# Patient Record
Sex: Female | Born: 1935 | Race: White | Hispanic: No | Marital: Married | Smoking: Never smoker
Health system: Southern US, Community
[De-identification: ages and names within clinical notes are randomized; demographics above are authoritative.]

## PROBLEM LIST (undated history)

## (undated) DIAGNOSIS — I509 Heart failure, unspecified: Secondary | ICD-10-CM

## (undated) DIAGNOSIS — G459 Transient cerebral ischemic attack, unspecified: Secondary | ICD-10-CM

## (undated) DIAGNOSIS — E785 Hyperlipidemia, unspecified: Secondary | ICD-10-CM

## (undated) DIAGNOSIS — M810 Age-related osteoporosis without current pathological fracture: Secondary | ICD-10-CM

## (undated) DIAGNOSIS — E039 Hypothyroidism, unspecified: Secondary | ICD-10-CM

## (undated) DIAGNOSIS — M47812 Spondylosis without myelopathy or radiculopathy, cervical region: Secondary | ICD-10-CM

## (undated) DIAGNOSIS — E890 Postprocedural hypothyroidism: Secondary | ICD-10-CM

## (undated) DIAGNOSIS — I10 Essential (primary) hypertension: Secondary | ICD-10-CM

## (undated) DIAGNOSIS — T4145XA Adverse effect of unspecified anesthetic, initial encounter: Secondary | ICD-10-CM

## (undated) DIAGNOSIS — K219 Gastro-esophageal reflux disease without esophagitis: Secondary | ICD-10-CM

## (undated) DIAGNOSIS — H409 Unspecified glaucoma: Secondary | ICD-10-CM

## (undated) DIAGNOSIS — J8410 Pulmonary fibrosis, unspecified: Secondary | ICD-10-CM

## (undated) DIAGNOSIS — C221 Intrahepatic bile duct carcinoma: Secondary | ICD-10-CM

## (undated) DIAGNOSIS — R000 Tachycardia, unspecified: Secondary | ICD-10-CM

## (undated) DIAGNOSIS — R112 Nausea with vomiting, unspecified: Secondary | ICD-10-CM

## (undated) DIAGNOSIS — C801 Malignant (primary) neoplasm, unspecified: Secondary | ICD-10-CM

## (undated) DIAGNOSIS — T8859XA Other complications of anesthesia, initial encounter: Secondary | ICD-10-CM

## (undated) DIAGNOSIS — Z9889 Other specified postprocedural states: Secondary | ICD-10-CM

## (undated) HISTORY — PX: THYROIDECTOMY: SHX17

## (undated) HISTORY — PX: ABDOMINAL HYSTERECTOMY: SHX81

## (undated) HISTORY — DX: Hyperlipidemia, unspecified: E78.5

## (undated) HISTORY — PX: JOINT REPLACEMENT: SHX530

## (undated) HISTORY — DX: Postprocedural hypothyroidism: E89.0

## (undated) HISTORY — DX: Essential (primary) hypertension: I10

## (undated) HISTORY — DX: Unspecified glaucoma: H40.9

## (undated) HISTORY — DX: Transient cerebral ischemic attack, unspecified: G45.9

## (undated) HISTORY — DX: Heart failure, unspecified: I50.9

## (undated) HISTORY — DX: Age-related osteoporosis without current pathological fracture: M81.0

## (undated) HISTORY — PX: EYE SURGERY: SHX253

## (undated) HISTORY — DX: Tachycardia, unspecified: R00.0

## (undated) HISTORY — PX: CHOLECYSTECTOMY: SHX55

## (undated) HISTORY — DX: Hypothyroidism, unspecified: E03.9

## (undated) HISTORY — DX: Spondylosis without myelopathy or radiculopathy, cervical region: M47.812

---

## 2013-04-23 DIAGNOSIS — H4011X0 Primary open-angle glaucoma, stage unspecified: Secondary | ICD-10-CM | POA: Diagnosis not present

## 2013-05-23 DIAGNOSIS — Z23 Encounter for immunization: Secondary | ICD-10-CM | POA: Diagnosis not present

## 2013-05-23 DIAGNOSIS — I10 Essential (primary) hypertension: Secondary | ICD-10-CM | POA: Diagnosis not present

## 2013-05-23 DIAGNOSIS — R7309 Other abnormal glucose: Secondary | ICD-10-CM | POA: Diagnosis not present

## 2013-05-23 DIAGNOSIS — K219 Gastro-esophageal reflux disease without esophagitis: Secondary | ICD-10-CM | POA: Diagnosis not present

## 2013-05-23 DIAGNOSIS — R030 Elevated blood-pressure reading, without diagnosis of hypertension: Secondary | ICD-10-CM | POA: Diagnosis not present

## 2013-05-30 DIAGNOSIS — I6789 Other cerebrovascular disease: Secondary | ICD-10-CM | POA: Diagnosis not present

## 2013-05-30 DIAGNOSIS — M542 Cervicalgia: Secondary | ICD-10-CM | POA: Diagnosis not present

## 2013-05-30 DIAGNOSIS — M47812 Spondylosis without myelopathy or radiculopathy, cervical region: Secondary | ICD-10-CM | POA: Diagnosis not present

## 2013-05-30 DIAGNOSIS — R918 Other nonspecific abnormal finding of lung field: Secondary | ICD-10-CM | POA: Diagnosis not present

## 2013-05-30 DIAGNOSIS — R42 Dizziness and giddiness: Secondary | ICD-10-CM | POA: Diagnosis not present

## 2013-05-30 DIAGNOSIS — M5030 Other cervical disc degeneration, unspecified cervical region: Secondary | ICD-10-CM | POA: Diagnosis not present

## 2013-05-31 DIAGNOSIS — R5381 Other malaise: Secondary | ICD-10-CM | POA: Diagnosis not present

## 2013-05-31 DIAGNOSIS — E041 Nontoxic single thyroid nodule: Secondary | ICD-10-CM | POA: Diagnosis not present

## 2013-05-31 DIAGNOSIS — I10 Essential (primary) hypertension: Secondary | ICD-10-CM | POA: Diagnosis not present

## 2013-05-31 DIAGNOSIS — R918 Other nonspecific abnormal finding of lung field: Secondary | ICD-10-CM | POA: Diagnosis not present

## 2013-05-31 DIAGNOSIS — M47812 Spondylosis without myelopathy or radiculopathy, cervical region: Secondary | ICD-10-CM | POA: Diagnosis not present

## 2013-05-31 DIAGNOSIS — I6789 Other cerebrovascular disease: Secondary | ICD-10-CM | POA: Diagnosis not present

## 2013-05-31 DIAGNOSIS — M5020 Other cervical disc displacement, unspecified cervical region: Secondary | ICD-10-CM | POA: Diagnosis not present

## 2013-05-31 DIAGNOSIS — M5030 Other cervical disc degeneration, unspecified cervical region: Secondary | ICD-10-CM | POA: Diagnosis not present

## 2013-05-31 DIAGNOSIS — M542 Cervicalgia: Secondary | ICD-10-CM | POA: Diagnosis not present

## 2013-05-31 DIAGNOSIS — E042 Nontoxic multinodular goiter: Secondary | ICD-10-CM | POA: Diagnosis not present

## 2013-05-31 DIAGNOSIS — E785 Hyperlipidemia, unspecified: Secondary | ICD-10-CM | POA: Diagnosis not present

## 2013-05-31 DIAGNOSIS — R42 Dizziness and giddiness: Secondary | ICD-10-CM | POA: Diagnosis not present

## 2013-06-07 DIAGNOSIS — E785 Hyperlipidemia, unspecified: Secondary | ICD-10-CM | POA: Diagnosis not present

## 2013-06-07 DIAGNOSIS — M479 Spondylosis, unspecified: Secondary | ICD-10-CM | POA: Diagnosis not present

## 2013-06-07 DIAGNOSIS — G459 Transient cerebral ischemic attack, unspecified: Secondary | ICD-10-CM | POA: Diagnosis not present

## 2013-06-07 DIAGNOSIS — I10 Essential (primary) hypertension: Secondary | ICD-10-CM | POA: Diagnosis not present

## 2013-07-17 DIAGNOSIS — E041 Nontoxic single thyroid nodule: Secondary | ICD-10-CM | POA: Diagnosis not present

## 2013-07-23 DIAGNOSIS — H4011X0 Primary open-angle glaucoma, stage unspecified: Secondary | ICD-10-CM | POA: Diagnosis not present

## 2013-07-24 DIAGNOSIS — R7989 Other specified abnormal findings of blood chemistry: Secondary | ICD-10-CM | POA: Diagnosis not present

## 2013-07-24 DIAGNOSIS — E785 Hyperlipidemia, unspecified: Secondary | ICD-10-CM | POA: Diagnosis not present

## 2013-07-24 DIAGNOSIS — I10 Essential (primary) hypertension: Secondary | ICD-10-CM | POA: Diagnosis not present

## 2013-07-26 DIAGNOSIS — E041 Nontoxic single thyroid nodule: Secondary | ICD-10-CM | POA: Diagnosis not present

## 2013-08-08 DIAGNOSIS — M79609 Pain in unspecified limb: Secondary | ICD-10-CM | POA: Diagnosis not present

## 2013-08-08 DIAGNOSIS — S99919A Unspecified injury of unspecified ankle, initial encounter: Secondary | ICD-10-CM | POA: Diagnosis not present

## 2013-08-08 DIAGNOSIS — S8990XA Unspecified injury of unspecified lower leg, initial encounter: Secondary | ICD-10-CM | POA: Diagnosis not present

## 2013-08-08 DIAGNOSIS — S92309A Fracture of unspecified metatarsal bone(s), unspecified foot, initial encounter for closed fracture: Secondary | ICD-10-CM | POA: Diagnosis not present

## 2013-08-10 DIAGNOSIS — S92309A Fracture of unspecified metatarsal bone(s), unspecified foot, initial encounter for closed fracture: Secondary | ICD-10-CM | POA: Diagnosis not present

## 2013-08-16 DIAGNOSIS — S92309A Fracture of unspecified metatarsal bone(s), unspecified foot, initial encounter for closed fracture: Secondary | ICD-10-CM | POA: Diagnosis not present

## 2013-08-28 DIAGNOSIS — E041 Nontoxic single thyroid nodule: Secondary | ICD-10-CM | POA: Diagnosis not present

## 2013-08-28 DIAGNOSIS — Z01812 Encounter for preprocedural laboratory examination: Secondary | ICD-10-CM | POA: Diagnosis not present

## 2013-08-28 DIAGNOSIS — Z01810 Encounter for preprocedural cardiovascular examination: Secondary | ICD-10-CM | POA: Diagnosis not present

## 2013-08-28 DIAGNOSIS — Z01811 Encounter for preprocedural respiratory examination: Secondary | ICD-10-CM | POA: Diagnosis not present

## 2013-08-28 DIAGNOSIS — I10 Essential (primary) hypertension: Secondary | ICD-10-CM | POA: Diagnosis not present

## 2013-09-19 DIAGNOSIS — E041 Nontoxic single thyroid nodule: Secondary | ICD-10-CM | POA: Diagnosis not present

## 2013-09-19 DIAGNOSIS — I10 Essential (primary) hypertension: Secondary | ICD-10-CM | POA: Diagnosis not present

## 2013-09-19 DIAGNOSIS — K219 Gastro-esophageal reflux disease without esophagitis: Secondary | ICD-10-CM | POA: Diagnosis not present

## 2013-09-19 DIAGNOSIS — D34 Benign neoplasm of thyroid gland: Secondary | ICD-10-CM | POA: Diagnosis not present

## 2013-09-19 DIAGNOSIS — E049 Nontoxic goiter, unspecified: Secondary | ICD-10-CM | POA: Diagnosis not present

## 2013-09-20 DIAGNOSIS — K219 Gastro-esophageal reflux disease without esophagitis: Secondary | ICD-10-CM | POA: Diagnosis not present

## 2013-09-20 DIAGNOSIS — D34 Benign neoplasm of thyroid gland: Secondary | ICD-10-CM | POA: Diagnosis not present

## 2013-09-20 DIAGNOSIS — I10 Essential (primary) hypertension: Secondary | ICD-10-CM | POA: Diagnosis not present

## 2013-09-24 DIAGNOSIS — M810 Age-related osteoporosis without current pathological fracture: Secondary | ICD-10-CM | POA: Diagnosis not present

## 2013-10-11 DIAGNOSIS — E890 Postprocedural hypothyroidism: Secondary | ICD-10-CM | POA: Diagnosis not present

## 2013-10-11 DIAGNOSIS — Z9089 Acquired absence of other organs: Secondary | ICD-10-CM | POA: Diagnosis not present

## 2013-10-18 DIAGNOSIS — D509 Iron deficiency anemia, unspecified: Secondary | ICD-10-CM | POA: Diagnosis not present

## 2013-10-22 DIAGNOSIS — Z961 Presence of intraocular lens: Secondary | ICD-10-CM | POA: Diagnosis not present

## 2013-10-22 DIAGNOSIS — H4011X0 Primary open-angle glaucoma, stage unspecified: Secondary | ICD-10-CM | POA: Diagnosis not present

## 2013-11-26 DIAGNOSIS — Z23 Encounter for immunization: Secondary | ICD-10-CM | POA: Diagnosis not present

## 2013-11-26 DIAGNOSIS — I10 Essential (primary) hypertension: Secondary | ICD-10-CM | POA: Diagnosis not present

## 2013-11-26 DIAGNOSIS — E785 Hyperlipidemia, unspecified: Secondary | ICD-10-CM | POA: Diagnosis not present

## 2013-11-26 DIAGNOSIS — I6350 Cerebral infarction due to unspecified occlusion or stenosis of unspecified cerebral artery: Secondary | ICD-10-CM | POA: Diagnosis not present

## 2013-12-03 DIAGNOSIS — K625 Hemorrhage of anus and rectum: Secondary | ICD-10-CM | POA: Diagnosis not present

## 2013-12-19 DIAGNOSIS — K644 Residual hemorrhoidal skin tags: Secondary | ICD-10-CM | POA: Diagnosis not present

## 2013-12-19 DIAGNOSIS — D125 Benign neoplasm of sigmoid colon: Secondary | ICD-10-CM | POA: Diagnosis not present

## 2013-12-19 DIAGNOSIS — K625 Hemorrhage of anus and rectum: Secondary | ICD-10-CM | POA: Diagnosis not present

## 2013-12-19 DIAGNOSIS — K5730 Diverticulosis of large intestine without perforation or abscess without bleeding: Secondary | ICD-10-CM | POA: Diagnosis not present

## 2014-01-22 DIAGNOSIS — H4011X1 Primary open-angle glaucoma, mild stage: Secondary | ICD-10-CM | POA: Diagnosis not present

## 2014-01-23 DIAGNOSIS — E890 Postprocedural hypothyroidism: Secondary | ICD-10-CM | POA: Diagnosis not present

## 2014-01-23 DIAGNOSIS — E048 Other specified nontoxic goiter: Secondary | ICD-10-CM | POA: Diagnosis not present

## 2014-01-24 DIAGNOSIS — Z1283 Encounter for screening for malignant neoplasm of skin: Secondary | ICD-10-CM | POA: Diagnosis not present

## 2014-01-24 DIAGNOSIS — L570 Actinic keratosis: Secondary | ICD-10-CM | POA: Diagnosis not present

## 2014-01-24 DIAGNOSIS — D485 Neoplasm of uncertain behavior of skin: Secondary | ICD-10-CM | POA: Diagnosis not present

## 2014-01-24 DIAGNOSIS — D049 Carcinoma in situ of skin, unspecified: Secondary | ICD-10-CM | POA: Diagnosis not present

## 2014-01-24 DIAGNOSIS — L820 Inflamed seborrheic keratosis: Secondary | ICD-10-CM | POA: Diagnosis not present

## 2014-02-05 DIAGNOSIS — Z9889 Other specified postprocedural states: Secondary | ICD-10-CM | POA: Diagnosis not present

## 2014-02-05 DIAGNOSIS — R490 Dysphonia: Secondary | ICD-10-CM | POA: Diagnosis not present

## 2014-02-20 DIAGNOSIS — D0439 Carcinoma in situ of skin of other parts of face: Secondary | ICD-10-CM | POA: Diagnosis not present

## 2014-03-21 DIAGNOSIS — L821 Other seborrheic keratosis: Secondary | ICD-10-CM | POA: Diagnosis not present

## 2014-03-21 DIAGNOSIS — L579 Skin changes due to chronic exposure to nonionizing radiation, unspecified: Secondary | ICD-10-CM | POA: Diagnosis not present

## 2014-03-21 DIAGNOSIS — Z85828 Personal history of other malignant neoplasm of skin: Secondary | ICD-10-CM | POA: Diagnosis not present

## 2014-03-21 DIAGNOSIS — L570 Actinic keratosis: Secondary | ICD-10-CM | POA: Diagnosis not present

## 2014-04-01 DIAGNOSIS — M810 Age-related osteoporosis without current pathological fracture: Secondary | ICD-10-CM | POA: Diagnosis not present

## 2014-04-02 DIAGNOSIS — Z9089 Acquired absence of other organs: Secondary | ICD-10-CM | POA: Diagnosis not present

## 2014-04-02 DIAGNOSIS — E890 Postprocedural hypothyroidism: Secondary | ICD-10-CM | POA: Diagnosis not present

## 2014-04-23 DIAGNOSIS — L0100 Impetigo, unspecified: Secondary | ICD-10-CM | POA: Diagnosis not present

## 2014-04-29 DIAGNOSIS — L0100 Impetigo, unspecified: Secondary | ICD-10-CM | POA: Diagnosis not present

## 2014-05-07 DIAGNOSIS — G459 Transient cerebral ischemic attack, unspecified: Secondary | ICD-10-CM | POA: Diagnosis not present

## 2014-05-07 DIAGNOSIS — E785 Hyperlipidemia, unspecified: Secondary | ICD-10-CM | POA: Diagnosis not present

## 2014-05-07 DIAGNOSIS — H409 Unspecified glaucoma: Secondary | ICD-10-CM | POA: Diagnosis not present

## 2014-05-07 DIAGNOSIS — M810 Age-related osteoporosis without current pathological fracture: Secondary | ICD-10-CM | POA: Diagnosis not present

## 2014-05-07 DIAGNOSIS — E890 Postprocedural hypothyroidism: Secondary | ICD-10-CM | POA: Diagnosis not present

## 2014-05-07 DIAGNOSIS — I10 Essential (primary) hypertension: Secondary | ICD-10-CM | POA: Diagnosis not present

## 2014-05-17 DIAGNOSIS — H4011X2 Primary open-angle glaucoma, moderate stage: Secondary | ICD-10-CM | POA: Diagnosis not present

## 2014-05-17 DIAGNOSIS — Z961 Presence of intraocular lens: Secondary | ICD-10-CM | POA: Diagnosis not present

## 2014-05-28 DIAGNOSIS — E890 Postprocedural hypothyroidism: Secondary | ICD-10-CM | POA: Diagnosis not present

## 2014-08-06 DIAGNOSIS — M7701 Medial epicondylitis, right elbow: Secondary | ICD-10-CM | POA: Diagnosis not present

## 2014-08-06 DIAGNOSIS — M810 Age-related osteoporosis without current pathological fracture: Secondary | ICD-10-CM | POA: Diagnosis not present

## 2014-08-06 DIAGNOSIS — M79643 Pain in unspecified hand: Secondary | ICD-10-CM | POA: Diagnosis not present

## 2014-08-06 DIAGNOSIS — M5032 Other cervical disc degeneration, mid-cervical region: Secondary | ICD-10-CM | POA: Diagnosis not present

## 2014-08-06 DIAGNOSIS — M19041 Primary osteoarthritis, right hand: Secondary | ICD-10-CM | POA: Diagnosis not present

## 2014-08-06 DIAGNOSIS — M150 Primary generalized (osteo)arthritis: Secondary | ICD-10-CM | POA: Diagnosis not present

## 2014-08-06 DIAGNOSIS — M542 Cervicalgia: Secondary | ICD-10-CM | POA: Diagnosis not present

## 2014-08-06 DIAGNOSIS — M19042 Primary osteoarthritis, left hand: Secondary | ICD-10-CM | POA: Diagnosis not present

## 2014-08-16 DIAGNOSIS — H4011X2 Primary open-angle glaucoma, moderate stage: Secondary | ICD-10-CM | POA: Diagnosis not present

## 2014-09-02 DIAGNOSIS — M810 Age-related osteoporosis without current pathological fracture: Secondary | ICD-10-CM | POA: Diagnosis not present

## 2014-09-04 DIAGNOSIS — Z0000 Encounter for general adult medical examination without abnormal findings: Secondary | ICD-10-CM | POA: Diagnosis not present

## 2014-09-04 DIAGNOSIS — E785 Hyperlipidemia, unspecified: Secondary | ICD-10-CM | POA: Diagnosis not present

## 2014-09-04 DIAGNOSIS — E890 Postprocedural hypothyroidism: Secondary | ICD-10-CM | POA: Diagnosis not present

## 2014-11-26 DIAGNOSIS — H4011X2 Primary open-angle glaucoma, moderate stage: Secondary | ICD-10-CM | POA: Diagnosis not present

## 2014-11-26 DIAGNOSIS — H04211 Epiphora due to excess lacrimation, right lacrimal gland: Secondary | ICD-10-CM | POA: Diagnosis not present

## 2014-12-23 DIAGNOSIS — Z23 Encounter for immunization: Secondary | ICD-10-CM | POA: Diagnosis not present

## 2015-02-03 DIAGNOSIS — M1990 Unspecified osteoarthritis, unspecified site: Secondary | ICD-10-CM | POA: Diagnosis not present

## 2015-02-03 DIAGNOSIS — M810 Age-related osteoporosis without current pathological fracture: Secondary | ICD-10-CM | POA: Diagnosis not present

## 2015-02-03 DIAGNOSIS — M47812 Spondylosis without myelopathy or radiculopathy, cervical region: Secondary | ICD-10-CM | POA: Diagnosis not present

## 2015-02-03 DIAGNOSIS — M542 Cervicalgia: Secondary | ICD-10-CM | POA: Diagnosis not present

## 2015-02-25 DIAGNOSIS — H401123 Primary open-angle glaucoma, left eye, severe stage: Secondary | ICD-10-CM | POA: Diagnosis not present

## 2015-02-25 DIAGNOSIS — H401113 Primary open-angle glaucoma, right eye, severe stage: Secondary | ICD-10-CM | POA: Diagnosis not present

## 2015-03-04 DIAGNOSIS — M810 Age-related osteoporosis without current pathological fracture: Secondary | ICD-10-CM | POA: Diagnosis not present

## 2015-09-24 DIAGNOSIS — M6740 Ganglion, unspecified site: Secondary | ICD-10-CM | POA: Insufficient documentation

## 2015-09-24 DIAGNOSIS — M19041 Primary osteoarthritis, right hand: Secondary | ICD-10-CM | POA: Insufficient documentation

## 2015-09-25 ENCOUNTER — Other Ambulatory Visit: Payer: Self-pay | Admitting: Orthopedic Surgery

## 2015-10-14 ENCOUNTER — Encounter (HOSPITAL_BASED_OUTPATIENT_CLINIC_OR_DEPARTMENT_OTHER): Payer: Self-pay

## 2015-10-14 ENCOUNTER — Ambulatory Visit (HOSPITAL_BASED_OUTPATIENT_CLINIC_OR_DEPARTMENT_OTHER): Admit: 2015-10-14 | Payer: Self-pay | Admitting: Orthopedic Surgery

## 2015-10-14 SURGERY — EXCISION MASS
Anesthesia: Monitor Anesthesia Care | Laterality: Right

## 2015-10-17 ENCOUNTER — Other Ambulatory Visit: Payer: Self-pay | Admitting: Family Medicine

## 2015-10-17 ENCOUNTER — Ambulatory Visit
Admission: RE | Admit: 2015-10-17 | Discharge: 2015-10-17 | Disposition: A | Discharge status: Home / Self Care | Payer: Medicare Other | Source: Ambulatory Visit | Attending: Family Medicine | Admitting: Family Medicine

## 2015-10-17 DIAGNOSIS — R0602 Shortness of breath: Secondary | ICD-10-CM

## 2015-10-21 ENCOUNTER — Other Ambulatory Visit (HOSPITAL_COMMUNITY): Payer: Self-pay | Admitting: Family Medicine

## 2015-10-21 DIAGNOSIS — R0600 Dyspnea, unspecified: Secondary | ICD-10-CM

## 2015-10-24 ENCOUNTER — Ambulatory Visit (HOSPITAL_COMMUNITY)
Admission: RE | Admit: 2015-10-24 | Discharge: 2015-10-24 | Disposition: A | Discharge status: Home / Self Care | Payer: Medicare Other | Source: Ambulatory Visit | Attending: Nurse Practitioner | Admitting: Nurse Practitioner

## 2015-10-24 DIAGNOSIS — R0600 Dyspnea, unspecified: Secondary | ICD-10-CM

## 2015-10-24 DIAGNOSIS — I351 Nonrheumatic aortic (valve) insufficiency: Secondary | ICD-10-CM | POA: Diagnosis not present

## 2015-10-24 DIAGNOSIS — I059 Rheumatic mitral valve disease, unspecified: Secondary | ICD-10-CM | POA: Insufficient documentation

## 2015-10-24 LAB — ECHOCARDIOGRAM COMPLETE
AVPHT: 547 ms
EERAT: 10.29
EWDT: 243 ms
FS: 25 % — AB (ref 28–44)
IVS/LV PW RATIO, ED: 0.88
LA vol A4C: 67.7 ml
LASIZE: 39 mm
LAVOL: 68.9 mL
LDCA: 3.8 cm2
LEFT ATRIUM END SYS DIAM: 39 mm
LV E/e' medial: 10.29
LV E/e'average: 10.29
LV PW d: 10.7 mm — AB (ref 0.6–1.1)
LV TDI E'MEDIAL: 5.77
LV e' LATERAL: 9.57 cm/s
LVOT diameter: 22 mm
MV Dec: 243
MV pk A vel: 74.7 m/s
MV pk E vel: 98.5 m/s
MVPG: 4 mmHg
RV TAPSE: 21.7 mm
Reg peak vel: 280 cm/s
TDI e' lateral: 9.57
TRMAXVEL: 280 cm/s

## 2015-10-24 NOTE — Progress Notes (Signed)
  Echocardiogram 2D Echocardiogram has been performed.  Summer Nielsen 10/24/2015, 3:48 PM

## 2015-10-29 DIAGNOSIS — M810 Age-related osteoporosis without current pathological fracture: Secondary | ICD-10-CM

## 2015-10-29 DIAGNOSIS — E785 Hyperlipidemia, unspecified: Secondary | ICD-10-CM

## 2015-10-29 DIAGNOSIS — I158 Other secondary hypertension: Secondary | ICD-10-CM

## 2015-10-29 DIAGNOSIS — H409 Unspecified glaucoma: Secondary | ICD-10-CM

## 2015-10-29 DIAGNOSIS — M47812 Spondylosis without myelopathy or radiculopathy, cervical region: Secondary | ICD-10-CM

## 2015-10-29 DIAGNOSIS — I509 Heart failure, unspecified: Secondary | ICD-10-CM

## 2015-10-29 DIAGNOSIS — E890 Postprocedural hypothyroidism: Secondary | ICD-10-CM

## 2015-10-29 DIAGNOSIS — G458 Other transient cerebral ischemic attacks and related syndromes: Secondary | ICD-10-CM

## 2015-10-30 ENCOUNTER — Encounter: Payer: Self-pay | Admitting: Cardiology

## 2015-10-30 ENCOUNTER — Ambulatory Visit (INDEPENDENT_AMBULATORY_CARE_PROVIDER_SITE_OTHER): Payer: Medicare Other | Admitting: Cardiology

## 2015-10-30 VITALS — BP 126/62 | HR 62 | Ht 61.0 in | Wt 143.0 lb

## 2015-10-30 DIAGNOSIS — I509 Heart failure, unspecified: Secondary | ICD-10-CM | POA: Insufficient documentation

## 2015-10-30 DIAGNOSIS — I5031 Acute diastolic (congestive) heart failure: Secondary | ICD-10-CM | POA: Insufficient documentation

## 2015-10-30 DIAGNOSIS — G459 Transient cerebral ischemic attack, unspecified: Secondary | ICD-10-CM | POA: Insufficient documentation

## 2015-10-30 DIAGNOSIS — E890 Postprocedural hypothyroidism: Secondary | ICD-10-CM | POA: Insufficient documentation

## 2015-10-30 DIAGNOSIS — IMO0002 Reserved for concepts with insufficient information to code with codable children: Secondary | ICD-10-CM | POA: Insufficient documentation

## 2015-10-30 DIAGNOSIS — M47812 Spondylosis without myelopathy or radiculopathy, cervical region: Secondary | ICD-10-CM | POA: Insufficient documentation

## 2015-10-30 DIAGNOSIS — I272 Other secondary pulmonary hypertension: Secondary | ICD-10-CM

## 2015-10-30 DIAGNOSIS — I110 Hypertensive heart disease with heart failure: Secondary | ICD-10-CM | POA: Diagnosis not present

## 2015-10-30 DIAGNOSIS — E785 Hyperlipidemia, unspecified: Secondary | ICD-10-CM | POA: Insufficient documentation

## 2015-10-30 DIAGNOSIS — H409 Unspecified glaucoma: Secondary | ICD-10-CM | POA: Insufficient documentation

## 2015-10-30 DIAGNOSIS — M810 Age-related osteoporosis without current pathological fracture: Secondary | ICD-10-CM | POA: Insufficient documentation

## 2015-10-30 DIAGNOSIS — I10 Essential (primary) hypertension: Secondary | ICD-10-CM | POA: Insufficient documentation

## 2015-10-30 NOTE — Patient Instructions (Signed)
Medication Instructions:  The current medical regimen is effective;  continue present plan and medications.  Follow-Up: Follow up in 2 months with Dr Marlou Porch.  Thank you for choosing [facility]!!

## 2015-10-30 NOTE — Progress Notes (Signed)
Cardiology Office Note    Date:  10/30/2015   ID:  Summer Nielsen, DOB 04-10-1927, MRN [id number]  PCP:  Jonathon Bellows, MD  Cardiologist:   Candee Furbish, MD   No chief complaint on file.   History of Present Illness:  Summer Nielsen is a 82 y.o. female here for evaluation of acute diastolic heart failure with elevated BNP, grade 2 diastolic dysfunction on echocardiogram, pulmonary infiltrate.  A few weeks ago, she began having more shortness of breath and was diagnosed originally with pneumonia. No fever, no chills. Treated with antibiotic course. She never improved that much. Dr. Justin Mend checked an echocardiogram which showed grade 2 diastolic dysfunction and BNP which was 200. She placed her on Lasix 40 mg once a day. Her lower extremity edema has improved. Her breathing is better but not completely resolved.  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 They are now here with their grand children.   Past Medical History:  Diagnosis Date  . CHF (congestive heart failure) ([place])   . Glaucoma   . High blood pressure   . Hyperlipidemia   . Hypothyroidism   . OP (osteoporosis)   . Postsurgical hypothyroidism   . 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 Tachycardia   . TIA (transient ischemic attack)     No past surgical history on file.  Current Medications: Outpatient Medications Prior to Visit  Medication Sig Dispense Refill  . AMLODIPINE BESYLATE PO Take 5 mg by mouth daily.    . ATORVASTATIN CALCIUM PO Take 20 mg by mouth daily.    . bimatoprost (LUMIGAN) 0.01 % SOLN 1 drop into affected eye in the evening    . calcium-vitamin D (OSCAL WITH D) 500-200 MG-UNIT tablet Take 1 tablet by mouth 2 (two) times daily.    . furosemide (LASIX) 40 MG tablet Take 40 mg by mouth  daily.    Marland Kitchen levothyroxine (SYNTHROID, LEVOTHROID) 112 MCG tablet Take 112 mcg by mouth daily before breakfast.    . LOSARTAN POTASSIUM PO Take 50 mg by mouth daily.    . meloxicam (MOBIC) 15 MG tablet Take 15 mg by mouth daily.    . Multiple Vitamins-Minerals (CENTRUM SILVER PO) Take by mouth daily.    Marland Kitchen omeprazole (PRILOSEC) 20 MG capsule Take 20 mg by mouth daily.    Marland Kitchen PROPRANOLOL HCL PO Take 20 mg by mouth 2 (two) times daily.    Marland Kitchen azithromycin (ZITHROMAX) 250 MG tablet 250 mg. Take 2 tablets on the first day then 1 tablet daily for 4 days, then once daily     No facility-administered medications prior to visit.      Allergies:   Levofloxacin and Percocet [oxycodone-acetaminophen]   Social History   Social History  . Marital status: Married    Spouse name: N/A  . Number of children: N/A  . Years of education: N/A   Social History Main Topics  . Smoking status: Never Smoker  . Smokeless tobacco: Never Used  . Alcohol use Not on file  . Drug use: Unknown  . Sexual activity: Not on file   Other Topics Concern  . Not on file   Social History Narrative  . No narrative on file     Family History:  The patient's father  died of MI. 47's.    ROS:   Please see the history of present illness.    ROS All other systems reviewed and are negative.   PHYSICAL EXAM:   VS:  BP 126/62   Pulse 62   Ht _0  (1.549 m)   Wt 143 lb (64.9 kg)   BMI 27.02 kg/m    GEN: Well nourished, well developed, in no acute distress  HEENT: normal  Neck: no JVD, carotid bruits, or masses Cardiac: RRR; no murmurs, rubs, or gallops,no edema  Respiratory:  clear to auscultation bilaterally, normal work of breathing GI: soft, nontender, nondistended, + BS MS: no deformity or atrophy  Skin: warm and dry, no rash Neuro:  Alert and Oriented x 3, Strength and sensation are intact Psych: euthymic mood, full affect  Wt Readings from Last 3 Encounters:  10/30/15 143 lb (64.9 kg)      Studies/Labs  Reviewed:   EKG:  None today  Recent Labs: No results found for requested labs within last 8760 hours.   Lipid Panel No results found for: CHOL, TRIG, HDL, CHOLHDL, VLDL, LDLCALC, LDLDIRECT  Additional studies/ records that were reviewed today include:   ECHO 10/24/15: - Left ventricle: The cavity size was normal. Systolic function was   normal. The estimated ejection fraction was in the range of 60%   to 65%. Wall motion was normal; there were no regional wall   motion abnormalities. Features are consistent with a pseudonormal   left ventricular filling pattern, with concomitant abnormal   relaxation and increased filling pressure (grade 2 diastolic   dysfunction). - Aortic valve: There was mild regurgitation. - Mitral valve: Calcified annulus. - Pulmonary arteries: Systolic pressure was mildly increased. PA   peak pressure: 34 mm Hg (S).  Prior lab work, office notes, EKG reviewed. Hemoglobin 11. Creatinine 0.8. BNP 200.   ASSESSMENT:    1. Acute diastolic heart failure ([place])   2. Secondary pulmonary hypertension ([place])   3. Hypertensive heart disease with heart failure (HCC)      PLAN:  In order of problems listed above:  Acute diastolic heart failure  - Continue with Lasix 40 mg once a day.  - Dr. Justin Mend will be checking her basic metabolic profile soon.  - I agree with current treatment plan. She seems to be improving. Definitely has lost fluid weight within her lower extremities and for overall body weight is decreased a few pounds.  - Continue with daily weights, salt restriction, fluid restriction 1.5 L a day.  - Overall I'm very pleased with her current treatment plan.   - I would like to see her back in 2 months.  Secondary pulmonary hypertension  - Very mild, likely a result of her diastolic dysfunction.   Essential hypertension  - Improved with Lasix menstruation. Watch renal function. On amlodipine, losartan, propranolol  Bradycardia  - Improved since  decreasing beta blocker dose.      Medication Adjustments/Labs and Tests Ordered: Current medicines are reviewed at length with the patient today.  Concerns regarding medicines are outlined above.  Medication changes, Labs and Tests ordered today are listed in the Patient Instructions below. Patient Instructions  Medication Instructions:  The current medical regimen is effective;  continue present plan and medications.  Follow-Up: Follow up in 2 months with Dr Marlou Porch.  Thank you for choosing [facility]!!        Signed, Candee Furbish, MD  10/30/2015 3:57 PM    [facility]  [place], [place], [place]  [address] Phone: [phone]; Fax: [phone]

## 2015-11-07 ENCOUNTER — Encounter: Payer: Self-pay | Admitting: Cardiology

## 2015-11-24 ENCOUNTER — Telehealth: Payer: Self-pay | Admitting: Cardiology

## 2015-11-24 NOTE — Telephone Encounter (Signed)
Lm to cb to discuss s/s.

## 2015-11-24 NOTE — Telephone Encounter (Signed)
New message     Pt c/o of Chest Pain: STAT if CP now or developed within 24 hours  1. Are you having CP right now? no  2. Are you experiencing any other symptoms (ex. SOB, nausea, vomiting, sweating)? no  3. How long have you been experiencing CP? weeks  4. Is your CP continuous or coming and going? coming and going 5. Have you taken Nitroglycerin? no?

## 2015-11-24 NOTE — Telephone Encounter (Signed)
We'll see how she does over the next few days. If her chest discomfort/jaw discomfort returns we will consider Lexiscan nuclear stress test. Pain certainly does have atypical qualities.  Candee Furbish, MD

## 2015-11-24 NOTE — Telephone Encounter (Signed)
Pt is reporting for the last several weeks, atleast 3 times a day but sometimes more frequently, she has had chest pain that starts above her breast and radiates up into her chin, no other radiation.  She reports it feels like pins and needles and is very painful.  She stops what she is doing and sits down.  The pain resolves on its own after about a minute.  She denies any other new s/s.  She has had SOB for some time that is essentially unchanged.  There is no correlation to eating/drinking.  Last HR 61 and BP has been between 112-124/60's.  She is not currently having any chest discomfort.  She takes medications as listed. Advised I will forward information to Dr Marlou Porch and call her back with any changes, recommendations and/or orders.

## 2015-11-24 NOTE — Telephone Encounter (Signed)
Spoke with pt RE: Dr Marlou Porch recommendation.  She will call back on Wednesday or Thursday to let me know how she is feeling. Further orders/recommendations will be given at that time.

## 2015-11-27 ENCOUNTER — Telehealth: Payer: Self-pay | Admitting: Cardiology

## 2015-11-27 NOTE — Telephone Encounter (Signed)
New Message  Pt calling requesting to speak with RN. Pt states her chest and jaw discomfort has returned and was instructed to call if it returned. Please call back to discuss

## 2015-11-27 NOTE — Telephone Encounter (Signed)
Called patient.  She wanted to report update on the chest pain she called about on 9/18 (see phone note).  This same pain occurred twice yesterday, in the am-after breakfast, mild, brief (couple seconds), and around 6 pm, after dinner, this was more severe and lasted a couple minutes then resolved.    The pain is sharp and needle like, from top of breast up to her chin.  She has had none today. No swelling. SOB is still present, no better, no worse, since starting lasix.  Pt is aware that I am forwarding to Dr. Marlou Porch to review and for any further advisement.

## 2015-11-28 NOTE — Telephone Encounter (Signed)
Left message to return call RE: recent concerns.

## 2015-11-28 NOTE — Telephone Encounter (Signed)
Pain sounds atypical and non cardiac in nature.  Reassurance Edema improved - excellent  If she would like, set her up for NUC stress test Carlton Adam).   Candee Furbish, MD

## 2015-11-28 NOTE — Telephone Encounter (Signed)
Follow up   Pt is returning call for rn

## 2015-11-28 NOTE — Telephone Encounter (Signed)
Spoke with pt who is reporting s/s are sporadic and she will c/b if they occur again.  She does not want to have further testing at this time.  She will keep her appt as scheduled but will c/b prior to then if necessary.

## 2015-12-25 ENCOUNTER — Encounter: Payer: Self-pay | Admitting: Cardiology

## 2016-01-08 ENCOUNTER — Encounter: Payer: Self-pay | Admitting: Cardiology

## 2016-01-08 ENCOUNTER — Ambulatory Visit (INDEPENDENT_AMBULATORY_CARE_PROVIDER_SITE_OTHER): Payer: Medicare Other | Admitting: Cardiology

## 2016-01-08 ENCOUNTER — Encounter (INDEPENDENT_AMBULATORY_CARE_PROVIDER_SITE_OTHER): Payer: Self-pay

## 2016-01-08 VITALS — BP 136/74 | HR 58 | Ht 62.0 in | Wt 144.6 lb

## 2016-01-08 DIAGNOSIS — I110 Hypertensive heart disease with heart failure: Secondary | ICD-10-CM | POA: Diagnosis not present

## 2016-01-08 DIAGNOSIS — R0600 Dyspnea, unspecified: Secondary | ICD-10-CM | POA: Diagnosis not present

## 2016-01-08 DIAGNOSIS — I5032 Chronic diastolic (congestive) heart failure: Secondary | ICD-10-CM | POA: Diagnosis not present

## 2016-01-08 NOTE — Patient Instructions (Addendum)

## 2016-01-08 NOTE — Progress Notes (Signed)
Cardiology Office Note    Date:  01/08/2016   ID:  Ethie Curless, DOB 05-16-1927, MRN [id number]  PCP:  Jonathon Bellows, MD  Cardiologist:   Candee Furbish, MD     History of Present Illness:  Summer Nielsen is a 82 y.o. female here for Follow-up of acute diastolic heart failure with elevated BNP, grade 2 diastolic dysfunction on echocardiogram, pulmonary infiltrate.  In July 2017, she began having more shortness of breath and was diagnosed originally with pneumonia. No fever, no chills. Treated with antibiotic course. She never improved that much. Dr. Justin Mend checked an echocardiogram which showed grade 2 diastolic dysfunction and BNP which was 200. She placed her on Lasix 40 mg once a day. Her lower extremity edema has improved. Her breathing is better but not completely resolved. Definitely improved.  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 They are now here with their grand children.  No squeezing CP up neck as she had previously transiently.    Past Medical History:  Diagnosis Date  . CHF (congestive heart failure) ([place])   . Glaucoma   . High blood pressure   . Hyperlipidemia   . Hypothyroidism   . OP (osteoporosis)   . Postsurgical hypothyroidism   . 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 Tachycardia   . TIA (transient ischemic attack)     No past surgical history on file.  Current Medications: Outpatient Medications Prior to Visit  Medication Sig Dispense Refill  . AMLODIPINE BESYLATE PO Take 5 mg by mouth daily.    . ATORVASTATIN CALCIUM PO Take 20 mg by mouth daily.    . bimatoprost (LUMIGAN) 0.01 % SOLN 1 drop into affected eye in the evening    . calcium-vitamin D (OSCAL WITH D) 500-200 MG-UNIT tablet Take 1 tablet by mouth 2 (two) times daily.    .  furosemide (LASIX) 40 MG tablet Take 40 mg by mouth daily.    Marland Kitchen levothyroxine (SYNTHROID, LEVOTHROID) 112 MCG tablet Take 112 mcg by mouth daily before breakfast.    . LOSARTAN POTASSIUM PO Take 50 mg by mouth daily.    . meloxicam (MOBIC) 15 MG tablet Take 15 mg by mouth daily.    . Multiple Vitamins-Minerals (CENTRUM SILVER PO) Take by mouth daily.    Marland Kitchen omeprazole (PRILOSEC) 20 MG capsule Take 20 mg by mouth daily.    Marland Kitchen PROPRANOLOL HCL PO Take 20 mg by mouth 2 (two) times daily.     No facility-administered medications prior to visit.      Allergies:   Levofloxacin and Percocet [oxycodone-acetaminophen]   Social History   Social History  . Marital status: Married    Spouse name: N/A  . Number of children: N/A  . Years of education: N/A   Social History Main Topics  . Smoking status: Never Smoker  . Smokeless tobacco: Never Used  . Alcohol use No  . Drug use: No  . Sexual activity: Not Asked   Other Topics Concern  . None   Social History Narrative  . None     Family History:  The patient's father died of MI. 55's.    ROS:   Please see the history of present illness.    ROS All other systems reviewed and are negative.  PHYSICAL EXAM:   VS:  BP 136/74   Pulse (!) 58   Ht _0  (1.575 m)   Wt 144 lb 9.6 oz (65.6 kg)   LMP  (LMP Unknown)   BMI 26.45 kg/m    GEN: Well nourished, well developed, in no acute distress  HEENT: normal  Neck: no JVD, carotid bruits, or masses Cardiac: RRR; no murmurs, rubs, or gallops,no edema  Respiratory:  clear to auscultation bilaterally, normal work of breathing GI: soft, nontender, nondistended, + BS MS: no deformity or atrophy  Skin: warm and dry, no rash Neuro:  Alert and Oriented x 3, Strength and sensation are intact Psych: euthymic mood, full affect  Wt Readings from Last 3 Encounters:  01/08/16 144 lb 9.6 oz (65.6 kg)  10/30/15 143 lb (64.9 kg)      Studies/Labs Reviewed:   EKG:  EKG was ordered today  01/08/16-sinus bradycardia rate 58 with no other abnormalities. Personally viewed.  Recent Labs: No results found for requested labs within last 8760 hours.   Lipid Panel No results found for: CHOL, TRIG, HDL, CHOLHDL, VLDL, LDLCALC, LDLDIRECT  Additional studies/ records that were reviewed today include:   ECHO 10/24/15: - Left ventricle: The cavity size was normal. Systolic function was   normal. The estimated ejection fraction was in the range of 60%   to 65%. Wall motion was normal; there were no regional wall   motion abnormalities. Features are consistent with a pseudonormal   left ventricular filling pattern, with concomitant abnormal   relaxation and increased filling pressure (grade 2 diastolic   dysfunction). - Aortic valve: There was mild regurgitation. - Mitral valve: Calcified annulus. - Pulmonary arteries: Systolic pressure was mildly increased. PA   peak pressure: 34 mm Hg (S).  Prior lab work, office notes, EKG reviewed. Hemoglobin 11. Creatinine 0.8. BNP 200.   ASSESSMENT:    1. Chronic diastolic congestive heart failure ([place])   2. Hypertensive heart disease with heart failure ([place])   3. Dyspnea, unspecified type      PLAN:  In order of problems listed above:  Chronic diastolic heart failure  - Continue with Lasix 40 mg once a day.  - Dr. Justin Mend will be checking her basic metabolic profile soon.  - I agree with current treatment plan. She seems to be improving. Definitely has lost fluid weight within her lower extremities and for overall body weight is decreased a few pounds.  - Continue with daily weights, salt restriction, fluid restriction 1.5 L a day.  - Overall I'm very pleased with her current treatment plan.   - I would like to see her back in 6 months.  Secondary pulmonary hypertension  - Very mild, likely a result of her diastolic dysfunction.   Essential hypertension  - Improved with Lasix. Watch renal function. On amlodipine, losartan,  propranolol  Bradycardia  - Improved since decreasing beta blocker dose. No syncope, doing well      Medication Adjustments/Labs and Tests Ordered: Current medicines are reviewed at length with the patient today.  Concerns regarding medicines are outlined above.  Medication changes, Labs and Tests ordered today are listed in the Patient Instructions below. Patient Instructions  Medication Instructions:  The current medical regimen is effective;  continue present plan and medications.  Follow-Up: Follow up in 6 months with Dr. Marlou Porch.  You will receive a letter in the mail 2 months before you are due.  Please call us when you receive this letter to schedule your follow  up appointment.  If you need a refill on your cardiac medications before your next appointment, please call your pharmacy.  Thank you for choosing [facility]!!        Signed, Candee Furbish, MD  01/08/2016 3:38 PM    [address], [place], [place]  [address] Phone: [phone]; Fax: [phone]

## 2016-02-16 ENCOUNTER — Ambulatory Visit (INDEPENDENT_AMBULATORY_CARE_PROVIDER_SITE_OTHER): Payer: Medicare Other | Admitting: Orthopaedic Surgery

## 2016-02-16 ENCOUNTER — Encounter (INDEPENDENT_AMBULATORY_CARE_PROVIDER_SITE_OTHER): Payer: Self-pay

## 2016-02-16 ENCOUNTER — Encounter (INDEPENDENT_AMBULATORY_CARE_PROVIDER_SITE_OTHER): Payer: Self-pay | Admitting: Orthopaedic Surgery

## 2016-02-16 VITALS — Ht 62.0 in | Wt 143.0 lb

## 2016-02-16 DIAGNOSIS — G8929 Other chronic pain: Secondary | ICD-10-CM | POA: Insufficient documentation

## 2016-02-16 DIAGNOSIS — M25511 Pain in right shoulder: Secondary | ICD-10-CM | POA: Diagnosis not present

## 2016-02-16 NOTE — Progress Notes (Signed)
Office Visit Note   Patient: Summer Nielsen           Date of Birth: 01-16-1928           MRN: [id number] Visit Date: 02/16/2016              Requested by: Maurice Small, MD [place], [place] [address] PCP: Jonathon Bellows, MD   Assessment & Plan: Visit Diagnoses:  1. Chronic right shoulder pain     Plan: Impression is rotator cuff syndrome of the right shoulder. Subacromial injection was given today hopefully this will calm it down and give her good relief. I like to reevaluate her in 3 weeks. If she is not better we'll need right shoulder x-rays and possibly MRI.  Follow-Up Instructions: Return in about 3 weeks (around 03/08/2016) for recheck right shoulder.   Orders:  No orders of the defined types were placed in this encounter.  No orders of the defined types were placed in this encounter.     Procedures: Large Joint Inj Date/Time: 02/16/2016 11:14 AM Performed by: Leandrew Koyanagi Authorized by: Leandrew Koyanagi   Consent Given by:  Patient Timeout: prior to procedure the correct patient, procedure, and site was verified   Indications:  Pain Location:  Shoulder Site:  R subacromial bursa Prep: patient was prepped and draped in usual sterile fashion   Needle Size:  22 G Approach:  Posterior Ultrasound Guidance: No   Fluoroscopic Guidance: No       Clinical Data: No additional findings.   Subjective: Chief Complaint  Patient presents with  . Right Shoulder - Pain    Summer Nielsen is a very pleasant 82 year old female with right shoulder and upper arm pain for about a month. She did have a fall about 4 months ago. Pain is 7 out of 10 minutes worse at night and worse with use of the arm and better with rest. Denies any radicular symptoms. Denies any constitutional symptoms.    Review of Systems  Constitutional: Negative.   HENT: Negative.   Eyes: Negative.   Respiratory: Negative.   Cardiovascular: Negative.   Endocrine:  Negative.   Musculoskeletal: Negative.   Neurological: Negative.   Hematological: Negative.   Psychiatric/Behavioral: Negative.   All other systems reviewed and are negative.    Objective: Vital Signs: Ht _0  (1.575 m)   Wt 143 lb (64.9 kg)   LMP  (LMP Unknown)   BMI 26.16 kg/m   Physical Exam  Constitutional: She is oriented to person, place, and time. She appears well-developed and well-nourished.  HENT:  Head: Atraumatic.  Eyes: EOM are normal.  Neck: Neck supple.  Cardiovascular: Intact distal pulses.   Pulmonary/Chest: Effort normal.  Abdominal: Soft.  Neurological: She is alert and oriented to person, place, and time.  Skin: Skin is warm. Capillary refill takes less than 2 seconds.  Psychiatric: She has a normal mood and affect. Her behavior is normal. Judgment and thought content normal.  Nursing note and vitals reviewed.   Ortho Exam Exam of the right shoulder shows normal passive and active range of motion. She does have positive Hawkins impingement. Positive cross adduction positive empty can infraspinatus testing is with pain and secondary weakness. Specialty Comments:  No specialty comments available.  Imaging: No results found.   PMFS History: Patient Active Problem List   Diagnosis Date Noted  . Chronic right shoulder pain 02/16/2016  . Acute diastolic heart failure ([place]) 10/30/2015  .  Secondary pulmonary hypertension 10/30/2015  . Hypertensive heart disease with heart failure ([place]) 10/30/2015  . TIA (transient ischemic attack)   . 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 Postsurgical hypothyroidism   . OP (osteoporosis)   . Hyperlipidemia   . High blood pressure   . Glaucoma   . CHF (congestive heart failure) (HCC)    Past Medical History:  Diagnosis Date  . CHF (congestive heart failure) ([place])   . Glaucoma   . High blood pressure   . Hyperlipidemia   . Hypothyroidism   . OP (osteoporosis)   . Postsurgical  hypothyroidism   . 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 Tachycardia   . TIA (transient ischemic attack)     No family history on file.  No past surgical history on file. Social History   Occupational History  . Not on file.   Social History Main Topics  . Smoking status: Never Smoker  . Smokeless tobacco: Never Used  . Alcohol use No  . Drug use: No  . Sexual activity: Not on file

## 2016-03-11 ENCOUNTER — Encounter (INDEPENDENT_AMBULATORY_CARE_PROVIDER_SITE_OTHER): Payer: Self-pay | Admitting: Orthopaedic Surgery

## 2016-03-11 ENCOUNTER — Ambulatory Visit (INDEPENDENT_AMBULATORY_CARE_PROVIDER_SITE_OTHER): Payer: Medicare Other | Admitting: Orthopaedic Surgery

## 2016-03-11 DIAGNOSIS — M25511 Pain in right shoulder: Secondary | ICD-10-CM

## 2016-03-11 DIAGNOSIS — G8929 Other chronic pain: Secondary | ICD-10-CM | POA: Diagnosis not present

## 2016-03-11 NOTE — Progress Notes (Signed)
   Office Visit Note   Patient: Summer Nielsen           Date of Birth: Jun 29, 1927           MRN: [id number] Visit Date: 03/11/2016              Requested by: Maurice Small, MD [place] [place], [place] [address] PCP: Jonathon Bellows, MD   Assessment & Plan: Visit Diagnoses:  1. Chronic right shoulder pain     Plan: MRI right shoulder to rule out structural abnormality and rotator cuff tear. follow-up after the MRI  Follow-Up Instructions: Return for recheck MRI right shoulder.   Orders:  Orders Placed This Encounter  Procedures  . MR SHOULDER RIGHT WO CONTRAST   No orders of the defined types were placed in this encounter.     Procedures: No procedures performed   Clinical Data: No additional findings.   Subjective: Chief Complaint  Patient presents with  . Right Shoulder - Pain    Agent comes back today for recheck of her right shoulder status post subacromial injection. She states she is about 50% better but still not where she wants to be. She is taking Advil for pain.    Review of Systems   Objective: Vital Signs: LMP  (LMP Unknown)   Physical Exam  Ortho Exam Exam of her right shoulder shows grossly intact rotator cuff testing but with pain with resistance. Specialty Comments:  No specialty comments available.  Imaging: No results found.   PMFS History: Patient Active Problem List   Diagnosis Date Noted  . Chronic right shoulder pain 02/16/2016  . Acute diastolic heart failure ([place]) 10/30/2015  . Secondary pulmonary hypertension 10/30/2015  . Hypertensive heart disease with heart failure ([place]) 10/30/2015  . TIA (transient ischemic attack)   . 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 Postsurgical hypothyroidism   . OP (osteoporosis)   . Hyperlipidemia   . High blood pressure   . Glaucoma   . CHF (congestive heart failure) (HCC)    Past Medical History:  Diagnosis Date  . CHF  (congestive heart failure) ([place])   . Glaucoma   . High blood pressure   . Hyperlipidemia   . Hypothyroidism   . OP (osteoporosis)   . Postsurgical hypothyroidism   . 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 Tachycardia   . TIA (transient ischemic attack)     No family history on file.  No past surgical history on file. Social History   Occupational History  . Not on file.   Social History Main Topics  . Smoking status: Never Smoker  . Smokeless tobacco: Never Used  . Alcohol use No  . Drug use: No  . Sexual activity: Not on file

## 2016-03-19 ENCOUNTER — Ambulatory Visit
Admission: RE | Admit: 2016-03-19 | Discharge: 2016-03-19 | Disposition: A | Discharge status: Home / Self Care | Payer: Medicare Other | Source: Ambulatory Visit | Attending: Orthopaedic Surgery | Admitting: Orthopaedic Surgery

## 2016-03-19 DIAGNOSIS — M25511 Pain in right shoulder: Principal | ICD-10-CM

## 2016-03-19 DIAGNOSIS — G8929 Other chronic pain: Secondary | ICD-10-CM

## 2016-03-26 ENCOUNTER — Ambulatory Visit (INDEPENDENT_AMBULATORY_CARE_PROVIDER_SITE_OTHER): Payer: Medicare Other | Admitting: Orthopaedic Surgery

## 2016-03-26 ENCOUNTER — Encounter (INDEPENDENT_AMBULATORY_CARE_PROVIDER_SITE_OTHER): Payer: Self-pay | Admitting: Orthopaedic Surgery

## 2016-03-26 DIAGNOSIS — M7541 Impingement syndrome of right shoulder: Secondary | ICD-10-CM | POA: Diagnosis not present

## 2016-03-26 MED ORDER — DICLOFENAC SODIUM 1 % TD GEL: 2.0000 g | Freq: Four times a day (QID) | TRANSDERMAL | 5 refills | Status: DC

## 2016-03-26 NOTE — Progress Notes (Signed)
Office Visit Note   Patient: Summer Nielsen           Date of Birth: [birth year]           MRN: [id number] Visit Date: 03/26/2016              Requested by: Maurice Small, MD [address] [place], [place] [address] PCP: Jonathon Bellows, MD   Assessment & Plan: Visit Diagnoses:  1. Impingement syndrome of right shoulder     Plan: Impression is right shoulder impingement and acromioclavicular joint arthropathy. MRI shows rotator cuff tendinosis without any frank tears. At this point patient seems to be doing okay and is managing it in does not feel that it'll require surgery. 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 If it does get worse she is to let us know about surgery which I described to her in detail which would involve an acromioplasty and distal clavicle excision. Questions encouraged and answered we'll see her back as needed prescription for Voltaren gel was given today  Follow-Up Instructions: Return if symptoms worsen or fail to improve.   Orders:  No orders of the defined types were placed in this encounter.  Meds ordered this encounter  Medications  . diclofenac sodium (VOLTAREN) 1 % GEL    Sig: Apply 2 g topically 4 (four) times daily.    Dispense:  1 Tube    Refill:  5      Procedures: No procedures performed   Clinical Data: No additional findings.   Subjective: Chief Complaint  Patient presents with  . Right Shoulder - Pain    Patient comes in today to review her MRI. She is doing okay overall.    Review of Systems   Objective: Vital Signs: LMP  (LMP Unknown)   Physical Exam  Ortho Exam Exam of the right shoulder shows mainly impingement signs and positive cross adduction sign. Specialty Comments:  No specialty comments available.  Imaging: No results found.   PMFS History: Patient Active Problem List   Diagnosis Date Noted  . Chronic right shoulder pain 02/16/2016  . Acute diastolic heart failure ([facility])  10/30/2015  . Secondary pulmonary hypertension 10/30/2015  . Hypertensive heart disease with heart failure ([facility]) 10/30/2015  . TIA (transient ischemic attack)   . 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 Postsurgical hypothyroidism   . OP (osteoporosis)   . Hyperlipidemia   . High blood pressure   . Glaucoma   . CHF (congestive heart failure) ([facility])    Past Medical History:  Diagnosis Date  . CHF (congestive heart failure) ([facility])   . Glaucoma   . High blood pressure   . Hyperlipidemia   . Hypothyroidism   . OP (osteoporosis)   . Postsurgical hypothyroidism   . 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 Tachycardia   . TIA (transient ischemic attack)     No family history on file.  No past surgical history on file. Social History   Occupational History  . Not on file.   Social History Main Topics  . Smoking status: Never Smoker  . Smokeless tobacco: Never Used  . Alcohol use No  . Drug use: No  . Sexual activity: Not on file

## 2016-05-20 ENCOUNTER — Emergency Department (HOSPITAL_COMMUNITY): Payer: Medicare Other

## 2016-05-20 ENCOUNTER — Observation Stay (HOSPITAL_COMMUNITY)
Admission: EM | Admit: 2016-05-20 | Discharge: 2016-05-22 | Disposition: A | Discharge status: Home / Self Care | Payer: Medicare Other | Attending: Internal Medicine | Admitting: Internal Medicine

## 2016-05-20 ENCOUNTER — Observation Stay (HOSPITAL_BASED_OUTPATIENT_CLINIC_OR_DEPARTMENT_OTHER): Payer: Medicare Other

## 2016-05-20 ENCOUNTER — Encounter (HOSPITAL_COMMUNITY): Payer: Self-pay | Admitting: Family Medicine

## 2016-05-20 DIAGNOSIS — D509 Iron deficiency anemia, unspecified: Secondary | ICD-10-CM | POA: Diagnosis present

## 2016-05-20 DIAGNOSIS — R0602 Shortness of breath: Secondary | ICD-10-CM

## 2016-05-20 DIAGNOSIS — Z885 Allergy status to narcotic agent status: Secondary | ICD-10-CM | POA: Diagnosis not present

## 2016-05-20 DIAGNOSIS — E7800 Pure hypercholesterolemia, unspecified: Secondary | ICD-10-CM | POA: Insufficient documentation

## 2016-05-20 DIAGNOSIS — M810 Age-related osteoporosis without current pathological fracture: Secondary | ICD-10-CM | POA: Insufficient documentation

## 2016-05-20 DIAGNOSIS — E890 Postprocedural hypothyroidism: Secondary | ICD-10-CM | POA: Diagnosis not present

## 2016-05-20 DIAGNOSIS — H409 Unspecified glaucoma: Secondary | ICD-10-CM | POA: Diagnosis not present

## 2016-05-20 DIAGNOSIS — K219 Gastro-esophageal reflux disease without esophagitis: Secondary | ICD-10-CM | POA: Insufficient documentation

## 2016-05-20 DIAGNOSIS — R2689 Other abnormalities of gait and mobility: Secondary | ICD-10-CM | POA: Insufficient documentation

## 2016-05-20 DIAGNOSIS — D649 Anemia, unspecified: Principal | ICD-10-CM | POA: Insufficient documentation

## 2016-05-20 DIAGNOSIS — I110 Hypertensive heart disease with heart failure: Secondary | ICD-10-CM | POA: Diagnosis not present

## 2016-05-20 DIAGNOSIS — Z8673 Personal history of transient ischemic attack (TIA), and cerebral infarction without residual deficits: Secondary | ICD-10-CM | POA: Insufficient documentation

## 2016-05-20 DIAGNOSIS — E785 Hyperlipidemia, unspecified: Secondary | ICD-10-CM | POA: Diagnosis not present

## 2016-05-20 DIAGNOSIS — I5032 Chronic diastolic (congestive) heart failure: Secondary | ICD-10-CM | POA: Insufficient documentation

## 2016-05-20 DIAGNOSIS — R0600 Dyspnea, unspecified: Secondary | ICD-10-CM

## 2016-05-20 DIAGNOSIS — M25511 Pain in right shoulder: Secondary | ICD-10-CM | POA: Diagnosis not present

## 2016-05-20 DIAGNOSIS — N179 Acute kidney failure, unspecified: Secondary | ICD-10-CM | POA: Insufficient documentation

## 2016-05-20 DIAGNOSIS — I509 Heart failure, unspecified: Secondary | ICD-10-CM

## 2016-05-20 DIAGNOSIS — G8929 Other chronic pain: Secondary | ICD-10-CM | POA: Insufficient documentation

## 2016-05-20 DIAGNOSIS — Z791 Long term (current) use of non-steroidal anti-inflammatories (NSAID): Secondary | ICD-10-CM | POA: Diagnosis not present

## 2016-05-20 LAB — FOLATE: FOLATE: 34.8 ng/mL (ref >5.9)

## 2016-05-20 LAB — CBC WITH DIFFERENTIAL/PLATELET
Basophils Absolute: 0 10*3/uL (ref 0.0–0.1)
Basophils Relative: 0 %
Eosinophils Absolute: 0.6 10*3/uL (ref 0.0–0.7)
Eosinophils Relative: 8 %
HEMATOCRIT: 23.2 % — AB (ref 36.0–46.0)
HEMOGLOBIN: 6.9 g/dL — AB (ref 12.0–15.0)
LYMPHS ABS: 1.8 10*3/uL (ref 0.7–4.0)
LYMPHS PCT: 25 %
MCH: 24.4 pg — AB (ref 26.0–34.0)
MCHC: 29.7 g/dL — AB (ref 30.0–36.0)
MCV: 82 fL (ref 78.0–100.0)
MONOS PCT: 9 %
Monocytes Absolute: 0.6 10*3/uL (ref 0.1–1.0)
NEUTROS ABS: 4.2 10*3/uL (ref 1.7–7.7)
NEUTROS PCT: 58 %
Platelets: 391 10*3/uL (ref 150–400)
RBC: 2.83 MIL/uL — AB (ref 3.87–5.11)
RDW: 16 % — ABNORMAL HIGH (ref 11.5–15.5)
WBC: 7.2 10*3/uL (ref 4.0–10.5)

## 2016-05-20 LAB — RETICULOCYTES
RBC.: 4.15 MIL/uL (ref 3.87–5.11)
RETIC COUNT ABSOLUTE: 83 10*3/uL (ref 19.0–186.0)
RETIC CT PCT: 2 % (ref 0.4–3.1)

## 2016-05-20 LAB — COMPREHENSIVE METABOLIC PANEL
ALBUMIN: 3.5 g/dL (ref 3.5–5.0)
ALK PHOS: 36 U/L — AB (ref 38–126)
ALT: 13 U/L — ABNORMAL LOW (ref 14–54)
ANION GAP: 8 (ref 5–15)
AST: 20 U/L (ref 15–41)
BUN: 24 mg/dL — ABNORMAL HIGH (ref 6–20)
CALCIUM: 8.3 mg/dL — AB (ref 8.9–10.3)
CO2: 25 mmol/L (ref 22–32)
Chloride: 107 mmol/L (ref 101–111)
Creatinine, Ser: 1.21 mg/dL — ABNORMAL HIGH (ref 0.44–1.00)
GFR calc Af Amer: 45 mL/min — ABNORMAL LOW (ref >60)
GFR calc non Af Amer: 39 mL/min — ABNORMAL LOW (ref >60)
GLUCOSE: 116 mg/dL — AB (ref 65–99)
POTASSIUM: 3.7 mmol/L (ref 3.5–5.1)
SODIUM: 140 mmol/L (ref 135–145)
Total Bilirubin: 0.4 mg/dL (ref 0.3–1.2)
Total Protein: 6.3 g/dL — ABNORMAL LOW (ref 6.5–8.1)

## 2016-05-20 LAB — FERRITIN: Ferritin: 10 ng/mL — ABNORMAL LOW (ref 11–307)

## 2016-05-20 LAB — IRON AND TIBC
Iron: 205 ug/dL — ABNORMAL HIGH (ref 28–170)
SATURATION RATIOS: 44 % — AB (ref 10.4–31.8)
TIBC: 465 ug/dL — ABNORMAL HIGH (ref 250–450)
UIBC: 260 ug/dL

## 2016-05-20 LAB — URINALYSIS, ROUTINE W REFLEX MICROSCOPIC
Bilirubin Urine: NEGATIVE
GLUCOSE, UA: NEGATIVE mg/dL
Hgb urine dipstick: NEGATIVE
KETONES UR: NEGATIVE mg/dL
LEUKOCYTES UA: NEGATIVE
Nitrite: NEGATIVE
PROTEIN: NEGATIVE mg/dL
Specific Gravity, Urine: 1.004 — ABNORMAL LOW (ref 1.005–1.030)
pH: 7 (ref 5.0–8.0)

## 2016-05-20 LAB — HEMOGLOBIN AND HEMATOCRIT, BLOOD
HCT: 33.2 % — ABNORMAL LOW (ref 36.0–46.0)
Hemoglobin: 10.2 g/dL — ABNORMAL LOW (ref 12.0–15.0)

## 2016-05-20 LAB — APTT: aPTT: 29 seconds (ref 24–36)

## 2016-05-20 LAB — BRAIN NATRIURETIC PEPTIDE: B Natriuretic Peptide: 427.1 pg/mL — ABNORMAL HIGH (ref 0.0–100.0)

## 2016-05-20 LAB — FIBRINOGEN: FIBRINOGEN: 434 mg/dL (ref 210–475)

## 2016-05-20 LAB — ECHOCARDIOGRAM COMPLETE
HEIGHTINCHES: 62 in
WEIGHTICAEL: 2304 [oz_av]

## 2016-05-20 LAB — PROTIME-INR
INR: 1.01
PROTHROMBIN TIME: 13.3 s (ref 11.4–15.2)

## 2016-05-20 LAB — VITAMIN B12: VITAMIN B 12: 711 pg/mL (ref 180–914)

## 2016-05-20 LAB — PREPARE RBC (CROSSMATCH)

## 2016-05-20 LAB — I-STAT TROPONIN, ED: TROPONIN I, POC: 0 ng/mL (ref 0.00–0.08)

## 2016-05-20 LAB — ABO/RH: ABO/RH(D): A POS

## 2016-05-20 LAB — POC OCCULT BLOOD, ED: Fecal Occult Bld: NEGATIVE

## 2016-05-20 MED ORDER — ACETAMINOPHEN 325 MG PO TABS
650.0000 mg | ORAL_TABLET | Freq: Four times a day (QID) | ORAL | Status: DC | PRN
  Administered 2016-05-20 – 2016-05-21 (×2): 650 mg via ORAL
  Filled 2016-05-20 (×2): qty 2

## 2016-05-20 MED ORDER — LEVOTHYROXINE SODIUM 112 MCG PO TABS
112.0000 ug | ORAL_TABLET | Freq: Every day | ORAL | Status: DC
  Administered 2016-05-21 – 2016-05-22 (×2): 112 ug via ORAL
  Filled 2016-05-20 (×3): qty 1

## 2016-05-20 MED ORDER — SODIUM CHLORIDE 0.9 % IV BOLUS (SEPSIS)
500.0000 mL | Freq: Once | INTRAVENOUS | Status: AC
  Administered 2016-05-20: 500 mL via INTRAVENOUS

## 2016-05-20 MED ORDER — PROPRANOLOL HCL 20 MG PO TABS
20.0000 mg | ORAL_TABLET | Freq: Two times a day (BID) | ORAL | Status: DC
  Administered 2016-05-20 – 2016-05-22 (×4): 20 mg via ORAL
  Filled 2016-05-20 (×5): qty 1

## 2016-05-20 MED ORDER — ONDANSETRON HCL 4 MG PO TABS: 4.0000 mg | ORAL_TABLET | Freq: Four times a day (QID) | ORAL | Status: DC | PRN

## 2016-05-20 MED ORDER — LATANOPROST 0.005 % OP SOLN
1.0000 [drp] | Freq: Every day | OPHTHALMIC | Status: DC
  Administered 2016-05-20 – 2016-05-21 (×2): 1 [drp] via OPHTHALMIC
  Filled 2016-05-20: qty 2.5

## 2016-05-20 MED ORDER — POLYETHYLENE GLYCOL 3350 17 G PO PACK: 17.0000 g | PACK | Freq: Every day | ORAL | Status: DC | PRN

## 2016-05-20 MED ORDER — ACETAMINOPHEN 650 MG RE SUPP: 650.0000 mg | Freq: Four times a day (QID) | RECTAL | Status: DC | PRN

## 2016-05-20 MED ORDER — PANTOPRAZOLE SODIUM 40 MG PO TBEC
40.0000 mg | DELAYED_RELEASE_TABLET | Freq: Every day | ORAL | Status: DC
  Administered 2016-05-20 – 2016-05-22 (×3): 40 mg via ORAL
  Filled 2016-05-20 (×3): qty 1

## 2016-05-20 MED ORDER — SODIUM CHLORIDE 0.9 % IV SOLN
Freq: Once | INTRAVENOUS | Status: AC
  Administered 2016-05-20: 1 mL via INTRAVENOUS

## 2016-05-20 MED ORDER — ONDANSETRON HCL 4 MG/2ML IJ SOLN: 4.0000 mg | Freq: Four times a day (QID) | INTRAMUSCULAR | Status: DC | PRN

## 2016-05-20 MED ORDER — HYDRALAZINE HCL 20 MG/ML IJ SOLN: 10.0000 mg | Freq: Three times a day (TID) | INTRAMUSCULAR | Status: DC | PRN

## 2016-05-20 NOTE — ED Provider Notes (Signed)
Medical screening examination/treatment/procedure(s) were performed by non-physician practitioner and as supervising physician I was immediately available for consultation/collaboration.   EKG Interpretation  Date/Time:  Thursday May 20 2016 07:33:12 EDT Ventricular Rate:  72 PR Interval:    QRS Duration: 89 QT Interval:  407 QTC Calculation: 446 R Axis:   15 Text Interpretation:  Sinus rhythm Probable anterior infarct, old No previous ECGs available Confirmed by Hector Venne  MD, Trinton Prewitt [phone]) on 05/20/2016 7:39:19 AM       Results for orders placed or performed during the hospital encounter of 05/20/16  Comprehensive metabolic panel  Result Value Ref Range   Sodium 140 135 - 145 mmol/L   Potassium 3.7 3.5 - 5.1 mmol/L   Chloride 107 101 - 111 mmol/L   CO2 25 22 - 32 mmol/L   Glucose, Bld 116 (H) 65 - 99 mg/dL   BUN 24 (H) 6 - 20 mg/dL   Creatinine, Ser 1.21 (H) 0.44 - 1.00 mg/dL   Calcium 8.3 (L) 8.9 - 10.3 mg/dL   Total Protein 6.3 (L) 6.5 - 8.1 g/dL   Albumin 3.5 3.5 - 5.0 g/dL   AST 20 15 - 41 U/L   ALT 13 (L) 14 - 54 U/L   Alkaline Phosphatase 36 (L) 38 - 126 U/L   Total Bilirubin 0.4 0.3 - 1.2 mg/dL   GFR calc non Af Amer 39 (L) >60 mL/min   GFR calc Af Amer 45 (L) >60 mL/min   Anion gap 8 5 - 15  Brain natriuretic peptide  Result Value Ref Range   B Natriuretic Peptide 427.1 (H) 0.0 - 100.0 pg/mL  CBC with Differential  Result Value Ref Range   WBC 7.2 4.0 - 10.5 K/uL   RBC 2.83 (L) 3.87 - 5.11 MIL/uL   Hemoglobin 6.9 (LL) 12.0 - 15.0 g/dL   HCT 23.2 (L) 36.0 - 46.0 %   MCV 82.0 78.0 - 100.0 fL   MCH 24.4 (L) 26.0 - 34.0 pg   MCHC 29.7 (L) 30.0 - 36.0 g/dL   RDW 16.0 (H) 11.5 - 15.5 %   Platelets 391 150 - 400 K/uL   Neutrophils Relative % 58 %   Neutro Abs 4.2 1.7 - 7.7 K/uL   Lymphocytes Relative 25 %   Lymphs Abs 1.8 0.7 - 4.0 K/uL   Monocytes Relative 9 %   Monocytes Absolute 0.6 0.1 - 1.0 K/uL   Eosinophils Relative 8 %   Eosinophils Absolute 0.6  0.0 - 0.7 K/uL   Basophils Relative 0 %   Basophils Absolute 0.0 0.0 - 0.1 K/uL  Protime-INR  Result Value Ref Range   Prothrombin Time 13.3 11.4 - 15.2 seconds   INR 1.01   Fibrinogen  Result Value Ref Range   Fibrinogen 434 210 - 475 mg/dL  PTT  Result Value Ref Range   aPTT 29 24 - 36 seconds  I-stat troponin, ED  Result Value Ref Range   Troponin i, poc 0.00 0.00 - 0.08 ng/mL   Comment 3          Type and screen [place]  Result Value Ref Range   ABO/RH(D) PENDING    Antibody Screen NEG    Sample Expiration 05/23/2016    Dg Chest 2 View  Result Date: 05/20/2016 CLINICAL DATA:  Cough.  Shortness of breath. EXAM: CHEST  2 VIEW COMPARISON:  10/17/2015 . FINDINGS: Mediastinum hilar structures are normal. Mild cardiomegaly P Mild bilateral interstitial prominence noted. Interstitial edema and/or pneumonitis  cannot be excluded. No pleural effusion or pneumothorax. IMPRESSION: 1. Cardiomegaly. 2. Mild bilateral pulmonary interstitial prominence. Mild interstitial edema and/or mild pneumonitis cannot be excluded . Electronically Signed   By: Marcello Moores  Register   On: 05/20/2016 07:48    Patient referred in by primary care provider for abnormal labs that were done yesterday. Patient's had shortness of breath. Labs showed significant anemia. Chest x-ray here today without any significant acute findings. Patient's symptoms most likely due to the hemoglobin being below 7. Have arrange blood transfusion they will also arrange admission.  Patient here is alert nontoxic no acute distress. Oxygen saturation 100%. Lungs CTA   CRITICAL CARE Performed by: Fredia Sorrow Total critical care time: 30 minutes Critical care time was exclusive of separately billable procedures and treating other patients. Critical care was necessary to treat or prevent imminent or life-threatening deterioration. Critical care was time spent personally by me on the following activities: development  of treatment plan with patient and/or surrogate as well as nursing, discussions with consultants, evaluation of patient's response to treatment, examination of patient, obtaining history from patient or surrogate, ordering and performing treatments and interventions, ordering and review of laboratory studies, ordering and review of radiographic studies, pulse oximetry and re-evaluation of patient's condition.    Fredia Sorrow, MD 05/20/16 [phone]

## 2016-05-20 NOTE — H&P (Signed)
[facility] History and Physical  Tesla Keeler WYS:[id number] DOB: [birth year] DOA: 05/20/2016  Referring physician:  PCP: Jonathon Bellows, MD   Chief Complaint: "My doctor said my hemoglobin was low."  HPI: Summer Nielsen is a 83 y.o. female  past mental history of congestive heart failure, glaucoma, high blood pressure, hypothyroid, tachycardia syndrome, transient ischemic attack and high cholesterol presented to the emergency room with abnormal lab work. Patient states she has a long-standing history of episodic anemia. She has never seen hematology for formal workup. She has not had any abnormal colonoscopies. Patient has re-see blood transfusions in the past without any events. Patient is not on chronic iron therapy. Patient has not noted any bleeding of any kind over the last month. Patient has been feeling weak over the last month with shortness of breath on exertion. Patient went to her family doctor for evaluation and lab work revealed that her hemoglobin was less than 7. Patient's doctor's office contacted her and advised her to go to the emergency room.  ED course: Patient was typed and crossed and blood transfusion was initiated in the emergency room. EKG showed PVCs but no acute ST abnormalities.   Review of Systems:  As per HPI otherwise 10 point review of systems negative.    Past Medical History:  Diagnosis Date  . CHF (congestive heart failure) ([facility])   . Glaucoma   . High blood pressure   . Hyperlipidemia   . Hypothyroidism   . OP (osteoporosis)   . Postsurgical hypothyroidism   . 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 Tachycardia   . TIA (transient ischemic attack)    History reviewed. No pertinent surgical history. Social History:  reports that she has never smoked. She has never used smokeless tobacco. She reports that she does not drink alcohol or use drugs.  Allergies  Allergen Reactions  . Levofloxacin Other (See  Comments)    Dizzy, hot, foot pain, insomnia, nausea  . Percocet [Oxycodone-Acetaminophen] Nausea And Vomiting and Other (See Comments)    sick    No family history on file.   Prior to Admission medications   Medication Sig Start Date End Date Taking? Authorizing Provider  amLODipine (NORVASC) 5 MG tablet Take 5 mg by mouth daily.   Yes Historical Provider, MD  atorvastatin (LIPITOR) 20 MG tablet Take 20 mg by mouth daily.   Yes Historical Provider, MD  bimatoprost (LUMIGAN) 0.01 % SOLN Place 1 drop into both eyes at bedtime.    Yes Historical Provider, MD  calcium-vitamin D (OSCAL WITH D) 500-200 MG-UNIT tablet Take 1 tablet by mouth 2 (two) times daily.   Yes Historical Provider, MD  Carboxymethylcellulose Sodium (THERATEARS OP) Place 1 drop into both eyes as needed (for dry eyes).   Yes Historical Provider, MD  furosemide (LASIX) 40 MG tablet Take 80 mg by mouth daily.    Yes Historical Provider, MD  levothyroxine (SYNTHROID, LEVOTHROID) 112 MCG tablet Take 112 mcg by mouth daily before breakfast.   Yes Historical Provider, MD  losartan (COZAAR) 50 MG tablet Take 50 mg by mouth daily.   Yes Historical Provider, MD  meloxicam (MOBIC) 15 MG tablet Take 15 mg by mouth daily.   Yes Historical Provider, MD  Multiple Vitamins-Minerals (CENTRUM SILVER PO) Take 1 tablet by mouth daily.    Yes Historical Provider, MD  omeprazole (PRILOSEC) 20 MG capsule Take 20 mg by mouth daily at 12 noon.    Yes Historical Provider, MD  propranolol (INDERAL) 20 MG tablet Take 20 mg by mouth 3 (three) times daily.    Yes Historical Provider, MD  diclofenac sodium (VOLTAREN) 1 % GEL Apply 2 g topically 4 (four) times daily. Patient not taking: Reported on 05/20/2016 03/26/16   Leandrew Koyanagi, MD   Physical Exam: Vitals:   05/20/16 0932 05/20/16 0940 05/20/16 0947 05/20/16 1012  BP:  (!) 145/52 (!) 147/50 (!) 132/50  Pulse: 66 62 64 67  Resp: _0 Temp:   98.3 F (36.8 C) 97.3 F (36.3 C)  TempSrc:    Oral Oral  SpO2: 99% 97% 98% 98%  Weight:      Height:        Wt Readings from Last 3 Encounters:  05/20/16 65.3 kg (144 lb)  02/16/16 64.9 kg (143 lb)  01/08/16 65.6 kg (144 lb 9.6 oz)    General:  Appears calm and comfortable, Alert and oriented 3 Eyes:  PERRL, EOMI, normal lids, iris ENT:  grossly normal hearing, lips & tongue Neck:  no LAD, masses or thyromegaly Cardiovascular:  RRR, no m/r/g. No LE edema.  Respiratory:  CTA bilaterally, no w/r/r. Normal respiratory effort. Abdomen:  soft, ntnd Skin:  no rash or induration seen on limited exam Musculoskeletal:  grossly normal tone BUE/BLE Psychiatric:  grossly normal mood and affect, speech fluent and appropriate Neurologic:  CN 2-12 grossly intact, moves all extremities in coordinated fashion.          Labs on Admission:  Basic Metabolic Panel:  Recent Labs Lab 05/20/16 0722  NA 140  K 3.7  CL 107  CO2 25  GLUCOSE 116*  BUN 24*  CREATININE 1.21*  CALCIUM 8.3*   Liver Function Tests:  Recent Labs Lab 05/20/16 0722  AST 20  ALT 13*  ALKPHOS 36*  BILITOT 0.4  PROT 6.3*  ALBUMIN 3.5   No results for input(s): LIPASE, AMYLASE in the last 168 hours. No results for input(s): AMMONIA in the last 168 hours. CBC:  Recent Labs Lab 05/20/16 0722  WBC 7.2  NEUTROABS 4.2  HGB 6.9*  HCT 23.2*  MCV 82.0  PLT 391   Cardiac Enzymes: No results for input(s): CKTOTAL, CKMB, CKMBINDEX, TROPONINI in the last 168 hours.  BNP (last 3 results)  Recent Labs  05/20/16 0722  BNP 427.1*    ProBNP (last 3 results) No results for input(s): PROBNP in the last 8760 hours.   Serum creatinine: 1.21 mg/dL (H) 05/20/16 9470 Estimated creatinine clearance: 29.1 mL/min (A)  CBG: No results for input(s): GLUCAP in the last 168 hours.  Radiological Exams on Admission: Dg Chest 2 View  Result Date: 05/20/2016 CLINICAL DATA:  Cough.  Shortness of breath. EXAM: CHEST  2 VIEW COMPARISON:  10/17/2015 . FINDINGS:  Mediastinum hilar structures are normal. Mild cardiomegaly P Mild bilateral interstitial prominence noted. Interstitial edema and/or pneumonitis cannot be excluded. No pleural effusion or pneumothorax. IMPRESSION: 1. Cardiomegaly. 2. Mild bilateral pulmonary interstitial prominence. Mild interstitial edema and/or mild pneumonitis cannot be excluded . Electronically Signed   By: Marcello Moores  Register   On: 05/20/2016 07:48    EKG: Independently reviewed. Normal sinus rhythm, PVCs, no STEMI. Compared to November 2017 EKG no acute ST changes but bradycardia is resolved.  Assessment/Plan Principal Problem:   Anemia Active Problems:   Postsurgical hypothyroidism   Hyperlipidemia   Glaucoma   CHF (congestive heart failure) (H[facility])  Anemia Coag negative and UA negative for blood 2 units to be transfused with  hemoglobin recheck afterwards Patient currently asymptomatic Anemia panel ordered Will need hematology follow-up as outpatient on discharge May needed dose of Lasix after transfusion but holding for now as patient has mild AK I creatinine 1.21 prior lab work to compare to but BUN/creatinine ratio reveals prerenal  SOB on exertion Likely due to anemia ECHO tomorrow as pt also has hx of CHF and reports no recent ECHO   Hypothyroidism Continue Synthroid  CHF Continue Inderal Holding lasix, Cozaar and Norvasc-restart in the morning  Glaucoma Lumigan to Xalatan while inpatient  Chronic arthritis Holding all NSAIDs  GERD Prilosec to Protonix  Code Status: FULL DVT Prophylaxis: SCDs Family Communication: husband at bedside Disposition Plan: Pending Improvement  Status: med-surg  Elwin Mocha, MD Family Medicine [facility] [email or web address] Password [email or web address]

## 2016-05-20 NOTE — ED Triage Notes (Signed)
Pt presents via POV from home with c/o shortness of breath x2 weeks. She saw Dr. Justin Mend (her PCP) yesterday and her ECG was WNL yesterday but her labwork showed "severe anemia" and pt was directed to come to the ER. Pt is A&Ox4 in NAD while at rest but moderate SOB with exertion.

## 2016-05-20 NOTE — Progress Notes (Signed)
Pt admitted to the unit at 1006. Pt mental status is A&O x 4. Pt oriented to room, staff, and call bell. Skin is intact. Full assessment charted in CH[facility]. Call bell within reach. Visitor guidelines reviewed w/ pt and/or family.

## 2016-05-20 NOTE — ED Notes (Signed)
EDNP made aware of critical HgB 6.9.

## 2016-05-20 NOTE — ED Notes (Signed)
Pt wheeled to BR -- wheel chair left in front of pt, informed to pull blue button when finished and a staff member would come assists getting up from toilet

## 2016-05-20 NOTE — Progress Notes (Signed)
*  PRELIMINARY RESULTS* Echocardiogram 2D Echocardiogram has been performed.  Summer Nielsen 05/20/2016, 4:15 PM

## 2016-05-20 NOTE — ED Notes (Signed)
Attempted report 

## 2016-05-20 NOTE — ED Provider Notes (Signed)
[facility] DEPT Provider Note   CSN: [id number] Arrival date & time: 05/20/16  0707     History   Chief Complaint Chief Complaint  Patient presents with  . Shortness of Breath  . Anemia    Per PCP Labwork    HPI Summer Nielsen is a 83 y.o. female.  Pt is a 83 y/o F with PMHx of CHF, htn, hld, hypothyroidism, and TIA who presents to ED for shortness of breath and productive cough with scant yellow sputum, onset approx 2 weeks ago, worse with exertion, went to PCP and noted to be anemic on bloodwork, referred to ED. Reports hx of blood transfusion in the past. Denies fevers, chills, unexplained weight loss, dizziness, vision or gait changes, CP, hemoptysis, pleurisy, abd pain, n/v/d, melena, dysuria, hematuria, extremity numbness/tingling/weakness or any additional concerns. No hx of DVT/PE. On 6m ASA daily. Last echo 10/24/15-LVEF 60-65%.       Past Medical History:  Diagnosis Date  . CHF (congestive heart failure) ([facility]   . Glaucoma   . High blood pressure   . Hyperlipidemia   . Hypothyroidism   . OP (osteoporosis)   . Postsurgical hypothyroidism   . 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 Tachycardia   . TIA (transient ischemic attack)     Patient Active Problem List   Diagnosis Date Noted  . Anemia 05/20/2016  . Chronic right shoulder pain 02/16/2016  . Acute diastolic heart failure ([employer] 10/30/2015  . Secondary pulmonary hypertension 10/30/2015  . Hypertensive heart disease with heart failure ([facility] 10/30/2015  . TIA (transient ischemic attack)   . 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 Postsurgical hypothyroidism   . OP (osteoporosis)   . Hyperlipidemia   . High blood pressure   . Glaucoma   . CHF (congestive heart failure) ([facility]     History reviewed. No pertinent surgical history.  OB History    No data available       Home Medications    Prior to Admission medications     Medication Sig Start Date End Date Taking? Authorizing Provider  amLODipine (NORVASC) 5 MG tablet Take 5 mg by mouth daily.   Yes Historical Provider, MD  atorvastatin (LIPITOR) 20 MG tablet Take 20 mg by mouth daily.   Yes Historical Provider, MD  bimatoprost (LUMIGAN) 0.01 % SOLN Place 1 drop into both eyes at bedtime.    Yes Historical Provider, MD  calcium-vitamin D (OSCAL WITH D) 500-200 MG-UNIT tablet Take 1 tablet by mouth 2 (two) times daily.   Yes Historical Provider, MD  Carboxymethylcellulose Sodium (THERATEARS OP) Place 1 drop into both eyes as needed (for dry eyes).   Yes Historical Provider, MD  furosemide (LASIX) 40 MG tablet Take 80 mg by mouth daily.    Yes Historical Provider, MD  levothyroxine (SYNTHROID, LEVOTHROID) 112 MCG tablet Take 112 mcg by mouth daily before breakfast.   Yes Historical Provider, MD  losartan (COZAAR) 50 MG tablet Take 50 mg by mouth daily.   Yes Historical Provider, MD  meloxicam (MOBIC) 15 MG tablet Take 15 mg by mouth daily.   Yes Historical Provider, MD  Multiple Vitamins-Minerals (CENTRUM SILVER PO) Take 1 tablet by mouth daily.    Yes Historical Provider, MD  omeprazole (PRILOSEC) 20 MG capsule Take 20 mg by mouth daily at 12 noon.    Yes Historical Provider, MD  propranolol (INDERAL) 20 MG tablet Take 20 mg by mouth 3 (three) times  daily.    Yes Historical Provider, MD    Family History No family history on file.  Social History Social History  Substance Use Topics  . Smoking status: Never Smoker  . Smokeless tobacco: Never Used  . Alcohol use No     Allergies   Levofloxacin and Percocet [oxycodone-acetaminophen]   Review of Systems Review of Systems  Constitutional: Negative for chills, fever and unexpected weight change.  HENT: Negative for congestion, ear pain and sore throat.   Eyes: Negative for visual disturbance.  Respiratory: Positive for cough and shortness of breath.   Cardiovascular: Negative for chest pain and leg  swelling.  Gastrointestinal: Negative for abdominal pain, diarrhea, nausea and vomiting.  Genitourinary: Negative for dysuria and hematuria.  Musculoskeletal: Negative for back pain, neck pain and neck stiffness.  Skin: Negative for rash.  Neurological: Negative for dizziness, weakness, numbness and headaches.  All other systems reviewed and are negative.    Physical Exam Updated Vital Signs BP (!) 152/58   Pulse 61   Temp 98.3 F (36.8 C) (Oral)   Resp 18   Ht _0  (1.575 m)   Wt 65.2 kg   LMP  (LMP Unknown)   SpO2 95%   BMI 26.28 kg/m   Physical Exam  Constitutional: She is oriented to person, place, and time. She appears well-developed.  HENT:  Head: Normocephalic and atraumatic.  Right Ear: Tympanic membrane and ear canal normal.  Left Ear: Tympanic membrane and ear canal normal.  Nose: Nose normal.  Mouth/Throat: Uvula is midline, oropharynx is clear and moist and mucous membranes are normal.  Eyes: Conjunctivae and EOM are normal. Pupils are equal, round, and reactive to light.  Neck: Normal range of motion. Neck supple.  Cardiovascular: Normal rate, regular rhythm, normal heart sounds and intact distal pulses.  Exam reveals no gallop and no friction rub.   No murmur heard. Pulmonary/Chest: Effort normal and breath sounds normal. She has no wheezes. She has no rales.  Abdominal: Soft. Bowel sounds are normal. There is no tenderness. There is no rebound and no guarding.  Musculoskeletal: Normal range of motion.  Neurological: She is alert and oriented to person, place, and time.  No acute neuro deficits. Strength 5/5 to bilateral UE and LE.   Skin: Skin is warm and dry.  Psychiatric: She has a normal mood and affect. Her behavior is normal.  Nursing note and vitals reviewed.    ED Treatments / Results  Labs (all labs ordered are listed, but only abnormal results are displayed) Labs Reviewed  COMPREHENSIVE METABOLIC PANEL - Abnormal; Notable for the following:        Result Value   Glucose, Bld 116 (*)    BUN 24 (*)    Creatinine, Ser 1.21 (*)    Calcium 8.3 (*)    Total Protein 6.3 (*)    ALT 13 (*)    Alkaline Phosphatase 36 (*)    GFR calc non Af Amer 39 (*)    GFR calc Af Amer 45 (*)    All other components within normal limits  BRAIN NATRIURETIC PEPTIDE - Abnormal; Notable for the following:    B Natriuretic Peptide 427.1 (*)    All other components within normal limits  CBC WITH DIFFERENTIAL/PLATELET - Abnormal; Notable for the following:    RBC 2.83 (*)    Hemoglobin 6.9 (*)    HCT 23.2 (*)    MCH 24.4 (*)    MCHC 29.7 (*)    RDW  16.0 (*)    All other components within normal limits  URINALYSIS, ROUTINE W REFLEX MICROSCOPIC - Abnormal; Notable for the following:    Color, Urine COLORLESS (*)    Specific Gravity, Urine 1.004 (*)    All other components within normal limits  IRON AND TIBC - Abnormal; Notable for the following:    Iron 205 (*)    TIBC 465 (*)    Saturation Ratios 44 (*)    All other components within normal limits  FERRITIN - Abnormal; Notable for the following:    Ferritin 10 (*)    All other components within normal limits  BASIC METABOLIC PANEL - Abnormal; Notable for the following:    Potassium 3.4 (*)    Creatinine, Ser 1.01 (*)    Calcium 8.1 (*)    GFR calc non Af Amer 49 (*)    GFR calc Af Amer 56 (*)    All other components within normal limits  CBC - Abnormal; Notable for the following:    Hemoglobin 9.5 (*)    HCT 31.0 (*)    MCH 24.4 (*)    RDW 17.0 (*)    All other components within normal limits  HEMOGLOBIN AND HEMATOCRIT, BLOOD - Abnormal; Notable for the following:    Hemoglobin 10.2 (*)    HCT 33.2 (*)    All other components within normal limits  GLUCOSE, CAPILLARY - Abnormal; Notable for the following:    Glucose-Capillary 100 (*)    All other components within normal limits  CBC - Abnormal; Notable for the following:    RBC 3.84 (*)    Hemoglobin 9.3 (*)    HCT 30.7 (*)     MCH 24.2 (*)    RDW 16.8 (*)    All other components within normal limits  BASIC METABOLIC PANEL - Abnormal; Notable for the following:    Calcium 8.2 (*)    GFR calc non Af Amer 54 (*)    All other components within normal limits  PROTIME-INR  FIBRINOGEN  APTT  VITAMIN B12  FOLATE  RETICULOCYTES  I-STAT TROPOININ, ED  POC OCCULT BLOOD, ED  TYPE AND SCREEN  PREPARE RBC (CROSSMATCH)  ABO/RH    EKG  EKG Interpretation  Date/Time:  Thursday May 20 2016 07:33:12 EDT Ventricular Rate:  72 PR Interval:    QRS Duration: 89 QT Interval:  407 QTC Calculation: 446 R Axis:   15 Text Interpretation:  Sinus rhythm Probable anterior infarct, old No previous ECGs available Confirmed by ZACKOWSKI  MD, SCOTT [phone]) on 05/20/2016 7:39:19 AM       Radiology No results found.  Procedures Procedures (including critical care time)  Medications Ordered in ED Medications  sodium chloride 0.9 % bolus 500 mL (500 mLs Intravenous Transfusing/Transfer 05/20/16 0924)  0.9 %  sodium chloride infusion ( Intravenous Transfusing/Transfer 05/20/16 0924)     Initial Impression / Assessment and Plan / ED Course  I have reviewed the triage vital signs and the nursing notes.  Pertinent labs & imaging results that were available during my care of the patient were reviewed by me and considered in my medical decision making (see chart for details).    Pt is a 82 y/o F who presents to ED for SOB and cough, will get CXR for PNA, EKG for ischemia/arrhythmia. Doubt PE at this time. Also reports anemia on recent bloodwork, will get CBC and CMP. Plan to hydrate and continue to monitor.    7:58 AM H/H 6.9  and 23.2, plan to obtain consent for blood transfusion.  8:06 AM Discussed results with pt, blood consent obtained, questions answered at this time, offers no additional concerns.   8:29 AM 2 units RBCs ordered, plan to admit.   8:57 AM Discussed with hospitalist, will admit  The patient was  discussed with and seen by Dr. Rogene Houston who agrees with the treatment plan.   Final Clinical Impressions(s) / ED Diagnoses   Final diagnoses:  Anemia, unspecified type  Shortness of breath    New Prescriptions Discharge Medication List as of 05/22/2016 12:29 PM       Ulice Bold, NP 05/23/16 2139    Fredia Sorrow, MD 06/09/16 [phone]

## 2016-05-21 ENCOUNTER — Ambulatory Visit: Payer: Medicare Other | Admitting: Cardiology

## 2016-05-21 DIAGNOSIS — D649 Anemia, unspecified: Secondary | ICD-10-CM | POA: Diagnosis not present

## 2016-05-21 LAB — TYPE AND SCREEN
ABO/RH(D): A POS
ANTIBODY SCREEN: NEGATIVE
Unit division: 0
Unit division: 0

## 2016-05-21 LAB — CBC
HEMATOCRIT: 31 % — AB (ref 36.0–46.0)
HEMOGLOBIN: 9.5 g/dL — AB (ref 12.0–15.0)
MCH: 24.4 pg — ABNORMAL LOW (ref 26.0–34.0)
MCHC: 30.6 g/dL (ref 30.0–36.0)
MCV: 79.7 fL (ref 78.0–100.0)
Platelets: 368 10*3/uL (ref 150–400)
RBC: 3.89 MIL/uL (ref 3.87–5.11)
RDW: 17 % — ABNORMAL HIGH (ref 11.5–15.5)
WBC: 7.6 10*3/uL (ref 4.0–10.5)

## 2016-05-21 LAB — BPAM RBC
BLOOD PRODUCT EXPIRATION DATE: 201804042359
Blood Product Expiration Date: 201804042359
ISSUE DATE / TIME: 201803150914
ISSUE DATE / TIME: 201803151332
UNIT TYPE AND RH: 6200
Unit Type and Rh: 6200

## 2016-05-21 LAB — BASIC METABOLIC PANEL
Anion gap: 11 (ref 5–15)
BUN: 20 mg/dL (ref 6–20)
CHLORIDE: 105 mmol/L (ref 101–111)
CO2: 25 mmol/L (ref 22–32)
Calcium: 8.1 mg/dL — ABNORMAL LOW (ref 8.9–10.3)
Creatinine, Ser: 1.01 mg/dL — ABNORMAL HIGH (ref 0.44–1.00)
GFR calc non Af Amer: 49 mL/min — ABNORMAL LOW (ref >60)
GFR, EST AFRICAN AMERICAN: 56 mL/min — AB (ref >60)
Glucose, Bld: 93 mg/dL (ref 65–99)
POTASSIUM: 3.4 mmol/L — AB (ref 3.5–5.1)
SODIUM: 141 mmol/L (ref 135–145)

## 2016-05-21 LAB — GLUCOSE, CAPILLARY: Glucose-Capillary: 100 mg/dL — ABNORMAL HIGH (ref 65–99)

## 2016-05-21 MED ORDER — MELOXICAM 7.5 MG PO TABS
15.0000 mg | ORAL_TABLET | Freq: Every day | ORAL | Status: DC
  Administered 2016-05-21 – 2016-05-22 (×2): 15 mg via ORAL
  Filled 2016-05-21 (×2): qty 2

## 2016-05-21 MED ORDER — AMLODIPINE BESYLATE 5 MG PO TABS
5.0000 mg | ORAL_TABLET | Freq: Every day | ORAL | Status: DC
  Administered 2016-05-21 – 2016-05-22 (×2): 5 mg via ORAL
  Filled 2016-05-21 (×2): qty 1

## 2016-05-21 MED ORDER — ATORVASTATIN CALCIUM 20 MG PO TABS
20.0000 mg | ORAL_TABLET | Freq: Every day | ORAL | Status: DC
  Administered 2016-05-21: 20 mg via ORAL
  Filled 2016-05-21: qty 1

## 2016-05-21 MED ORDER — CALCIUM CARBONATE-VITAMIN D 500-200 MG-UNIT PO TABS
1.0000 | ORAL_TABLET | Freq: Two times a day (BID) | ORAL | Status: DC
  Administered 2016-05-21 – 2016-05-22 (×3): 1 via ORAL
  Filled 2016-05-21 (×3): qty 1

## 2016-05-21 NOTE — Evaluation (Signed)
Physical Therapy Evaluation Patient Details Name: Summer Nielsen MRN: [id number] DOB: April 04, 1927 Today's Date: 05/21/2016   History of Present Illness  Summer Nielsen is a 83 y.o. female  past mental history of congestive heart failure, glaucoma, high blood pressure, hypothyroid, tachycardia syndrome, transient ischemic attack and high cholesterol presented to the emergency room with abnormal lab work (low Hgb)  Clinical Impression  Pt is at or close to baseline functioning and should be safe at home with available assist. There are no further acute PT needs.  Will sign off at this time.     Follow Up Recommendations No PT follow up    Equipment Recommendations  None recommended by PT    Recommendations for Other Services       Precautions / Restrictions Precautions Precautions: Fall (low risk)      Mobility  Bed Mobility Overal bed mobility: Independent                Transfers Overall transfer level: Modified independent                  Ambulation/Gait Ambulation/Gait assistance: Modified independent (Device/Increase time) Ambulation Distance (Feet): 140 Feet Assistive device: Straight cane Gait Pattern/deviations: Step-through pattern Gait velocity: moderate Gait velocity interpretation: at or above normal speed for age/gender General Gait Details: steady and able to manage with challenges to balance including scanning her env, abrupt turns, etc.  Stairs            Wheelchair Mobility    Modified Rankin (Stroke Patients Only)       Balance Overall balance assessment: No apparent balance deficits (not formally assessed)                                           Pertinent Vitals/Pain Pain Assessment: No/denies pain    Home Living Family/patient expects to be discharged to:: Private residence Living Arrangements: Spouse/significant other Available Help at Discharge: Family;Available 24 hours/day Type of Home:  Apartment Home Access: Level entry     Home Layout: One level Home Equipment: Cane - single point;Walker - 2 wheels;Bedside commode;Grab bars - tub/shower;Grab bars - toilet      Prior Function Level of Independence: Independent with assistive device(s)         Comments: drives, runs errands independently     Hand Dominance        Extremity/Trunk Assessment   Upper Extremity Assessment Upper Extremity Assessment: Overall WFL for tasks assessed    Lower Extremity Assessment Lower Extremity Assessment: Overall WFL for tasks assessed       Communication   Communication: No difficulties  Cognition Arousal/Alertness: Awake/alert Behavior During Therapy: WFL for tasks assessed/performed Overall Cognitive Status: Within Functional Limits for tasks assessed                      General Comments      Exercises     Assessment/Plan    PT Assessment Patent does not need any further PT services  PT Problem List         PT Treatment Interventions      PT Goals (Current goals can be found in the Care Plan section)  Acute Rehab PT Goals PT Goal Formulation: All assessment and education complete, DC therapy    Frequency     Barriers to discharge        Co-evaluation  End of Session   Activity Tolerance: Patient tolerated treatment well Patient left: in bed;with call bell/phone within reach (EOB) Nurse Communication: Mobility status PT Visit Diagnosis: Other abnormalities of gait and mobility (R26.89)    Functional Assessment Tool Used: AM-PAC 6 Clicks Basic Mobility Functional Limitation: Mobility: Walking and moving around Mobility: Walking and Moving Around Current Status (Z6109): 0 percent impaired, limited or restricted Mobility: Walking and Moving Around Goal Status (U0454): 0 percent impaired, limited or restricted Mobility: Walking and Moving Around Discharge Status [phone]): 0 percent impaired, limited or restricted     Time: 9147-8295 PT Time Calculation (min) (ACUTE ONLY): 16 min   Charges:   PT Evaluation $PT Eval Low Complexity: 1 Procedure     PT G Codes:   PT G-Codes **NOT FOR INPATIENT CLASS** Functional Assessment Tool Used: AM-PAC 6 Clicks Basic Mobility Functional Limitation: Mobility: Walking and moving around Mobility: Walking and Moving Around Current Status (A2130): 0 percent impaired, limited or restricted Mobility: Walking and Moving Around Goal Status (Q6578): 0 percent impaired, limited or restricted Mobility: Walking and Moving Around Discharge Status (I6962): 0 percent impaired, limited or restricted     Tessie Fass Fallon Howerter 05/21/2016, 5:23 PM  05/21/2016  Donnella Sham, PT [phone]  (pager)

## 2016-05-21 NOTE — Progress Notes (Signed)
Patient ID: Summer Nielsen, female   DOB: 1927/05/30, 83 y.o.   MRN: [id number]    PROGRESS NOTE    Summer Nielsen  OAC:[id number] DOB: [birth year] DOA: 05/20/2016  PCP: Jonathon Bellows, MD   Brief Narrative:   83 y.o. female  past mental history of congestive heart failure, glaucoma, high blood pressure, hypothyroid, tachycardia syndrome, transient ischemic attack and high cholesterol presented to the emergency room with abnormal lab work. Patient states she has a long-standing history of episodic anemia. She has never seen hematology for formal workup. She has not had any abnormal colonoscopies.   Assessment & Plan:   Principal Problem:   Anemia - appears to be chronic in nature - d/w hemologist on call - since Hg is down from 10.2 --> 9.5 this AM, will keep in patient one more day - outpatient follow up will be arranged for pt   Active Problems:   Postsurgical hypothyroidism - continue synthroid     Hyperlipidemia - continue home medical regimen     CHF (congestive heart failure) (HCC) - chronic diastolic CHF - no signs of volume overload - EF 55 %    Acute kidney injury - resolving with blood transfusion  - holding lasix and losartan for now - can likely resume in AM   DVT prophylaxis: SCD's Code Status: Full  Family Communication: Patient at bedside  Disposition Plan: Home in AM if Hg stable   Consultants:   Dr Lindi Adie over the phone   Procedures:   None  Antimicrobials:   None   Subjective: No events overnight   Objective: Vitals:   05/20/16 2151 05/21/16 0603 05/21/16 0608 05/21/16 1101  BP: (!) 152/57 (!) 144/48  (!) 166/61  Pulse: 75 (!) 59  66  Resp: 18 18    Temp: 98.3 F (36.8 C) 98.3 F (36.8 C)    TempSrc: Oral Oral    SpO2: 97% 94%    Weight:   64.1 kg (141 lb 6.4 oz)   Height:        Intake/Output Summary (Last 24 hours) at 05/21/16 1134 Last data filed at 05/21/16 0900  Gross per 24 hour  Intake             1180 ml    Output                0 ml  Net             1180 ml   Filed Weights   05/20/16 0716 05/21/16 0608  Weight: 65.3 kg (144 lb) 64.1 kg (141 lb 6.4 oz)    Examination:  General exam: Appears calm and comfortable  Respiratory system: Clear to auscultation. Respiratory effort normal. Cardiovascular system: S1 & S2 heard, RRR. No rubs, gallops or clicks.  Gastrointestinal system: Abdomen is nondistended, soft and nontender. No organomegaly or masses felt. Normal bowel sounds heard. Central nervous system: Alert and oriented. No focal neurological deficits. Extremities: Symmetric 5 x 5 power. Skin: No rashes, lesions or ulcers Psychiatry: Judgement and insight appear normal. Mood & affect appropriate.    Data Reviewed: I have personally reviewed following labs and imaging studies  CBC:  Recent Labs Lab 05/20/16 0722 05/20/16 1856 05/21/16 0708  WBC 7.2  --  7.6  NEUTROABS 4.2  --   --   HGB 6.9* 10.2* 9.5*  HCT 23.2* 33.2* 31.0*  MCV 82.0  --  79.7  PLT 391  --  010   Basic Metabolic Panel:  Recent  Labs Lab 05/20/16 0722 05/21/16 0708  NA 140 141  K 3.7 3.4*  CL 107 105  CO2 25 25  GLUCOSE 116* 93  BUN 24* 20  CREATININE 1.21* 1.01*  CALCIUM 8.3* 8.1*   Liver Function Tests:  Recent Labs Lab 05/20/16 0722  AST 20  ALT 13*  ALKPHOS 36*  BILITOT 0.4  PROT 6.3*  ALBUMIN 3.5   Coagulation Profile:  Recent Labs Lab 05/20/16 0722  INR 1.01   CBG:  Recent Labs Lab 05/21/16 0858  GLUCAP 100*   Anemia Panel:  Recent Labs  05/20/16 1856  VITAMINB12 711  FOLATE 34.8  FERRITIN 10*  TIBC 465*  IRON 205*  RETICCTPCT 2.0   Urine analysis:    Component Value Date/Time   COLORURINE COLORLESS (A) 05/20/2016 0838   APPEARANCEUR CLEAR 05/20/2016 0838   LABSPEC 1.004 (L) 05/20/2016 0838   PHURINE 7.0 05/20/2016 0838   GLUCOSEU NEGATIVE 05/20/2016 0838   HGBUR NEGATIVE 05/20/2016 0838   BILIRUBINUR NEGATIVE 05/20/2016 0838   KETONESUR NEGATIVE  05/20/2016 0838   PROTEINUR NEGATIVE 05/20/2016 0838   NITRITE NEGATIVE 05/20/2016 0838   LEUKOCYTESUR NEGATIVE 05/20/2016 0838   Radiology Studies: Dg Chest 2 View  Result Date: 05/20/2016 CLINICAL DATA:  Cough.  Shortness of breath. EXAM: CHEST  2 VIEW COMPARISON:  10/17/2015 . FINDINGS: Mediastinum hilar structures are normal. Mild cardiomegaly P Mild bilateral interstitial prominence noted. Interstitial edema and/or pneumonitis cannot be excluded. No pleural effusion or pneumothorax. IMPRESSION: 1. Cardiomegaly. 2. Mild bilateral pulmonary interstitial prominence. Mild interstitial edema and/or mild pneumonitis cannot be excluded . Electronically Signed   By: Marcello Moores  Register   On: 05/20/2016 07:48    Scheduled Meds: . amLODipine  5 mg Oral Daily  . atorvastatin  20 mg Oral q1800  . calcium-vitamin D  1 tablet Oral BID WC  . latanoprost  1 drop Both Eyes QHS  . levothyroxine  112 mcg Oral QAC breakfast  . meloxicam  15 mg Oral Daily  . pantoprazole  40 mg Oral Daily  . propranolol  20 mg Oral BID   Continuous Infusions:   LOS: 0 days   Time spent: 20 minutes   Faye Ramsay, MD [facility] Pager [phone]  If 7PM-7AM, please contact night-coverage [email or web address] Password [facility] 05/21/2016, 11:34 AM

## 2016-05-21 NOTE — Progress Notes (Signed)
Notified at 1728 about possible a flutter short burst at rate 123 to at 1312.  Notified MD and we will continue to monitor.  Patient interviewed and don't recall anything around that time.

## 2016-05-21 NOTE — Care Management Obs Status (Signed)
[facility] NOTIFICATION   Patient Details  Name: Devoiry Corriher MRN: [id number] Date of Birth: 12-Jul-1927   Medicare Observation Status Notification Given:  Yes    Sharin Mons, RN 05/21/2016, 3:48 PM

## 2016-05-22 DIAGNOSIS — D649 Anemia, unspecified: Secondary | ICD-10-CM | POA: Diagnosis not present

## 2016-05-22 LAB — BASIC METABOLIC PANEL
Anion gap: 10 (ref 5–15)
BUN: 20 mg/dL (ref 6–20)
CHLORIDE: 105 mmol/L (ref 101–111)
CO2: 25 mmol/L (ref 22–32)
CREATININE: 0.93 mg/dL (ref 0.44–1.00)
Calcium: 8.2 mg/dL — ABNORMAL LOW (ref 8.9–10.3)
GFR, EST NON AFRICAN AMERICAN: 54 mL/min — AB (ref >60)
Glucose, Bld: 97 mg/dL (ref 65–99)
POTASSIUM: 3.5 mmol/L (ref 3.5–5.1)
SODIUM: 140 mmol/L (ref 135–145)

## 2016-05-22 LAB — CBC
HCT: 30.7 % — ABNORMAL LOW (ref 36.0–46.0)
HEMOGLOBIN: 9.3 g/dL — AB (ref 12.0–15.0)
MCH: 24.2 pg — AB (ref 26.0–34.0)
MCHC: 30.3 g/dL (ref 30.0–36.0)
MCV: 79.9 fL (ref 78.0–100.0)
PLATELETS: 363 10*3/uL (ref 150–400)
RBC: 3.84 MIL/uL — ABNORMAL LOW (ref 3.87–5.11)
RDW: 16.8 % — ABNORMAL HIGH (ref 11.5–15.5)
WBC: 8 10*3/uL (ref 4.0–10.5)

## 2016-05-22 NOTE — Discharge Summary (Addendum)
Physician Discharge Summary  Summer Nielsen EHU:[id number] DOB: 16-May-1927 DOA: 05/20/2016  PCP: Jonathon Bellows, MD  Admit date: 05/20/2016 Discharge date: 05/22/2016  Recommendations for Outpatient Follow-up:  1. Pt will need to follow up with PCP in 2-3 weeks post discharge 2. Please obtain BMP to evaluate electrolytes and kidney function 3. Please also check CBC to evaluate Hg and Hct levels 4. Will forward this note to oncologist Dr. Lindi Adie to see if pt can be seen on follow up in about one month   Discharge Diagnoses:  Principal Problem:   Anemia Active Problems:   Postsurgical hypothyroidism   Hyperlipidemia   Glaucoma   CHF (congestive heart failure) ([facility])  Discharge Condition: Stable  Diet recommendation: Heart healthy diet discussed in details   Brief Narrative:   83 y.o.femalepast mental history of congestive heart failure, glaucoma, high blood pressure, hypothyroid, tachycardia syndrome, transient ischemic attack and high cholesterol presented to the emergency room with abnormal lab work. Patient states she has a long-standing history of episodic anemia. She has never seen hematology for formal workup. She has not had any abnormal colonoscopies.   Assessment & Plan:   Principal Problem:   Anemia of uncertain etiology but most likely of chronic nature  - d/w hemologist on call - since Hg is down from 10.2 --> 9.5 --> 9.3  this AM - pt wants to go home - I spoke with Dr. Lindi Adie over the phone and he has recommended follow up in an outpatient setting  - I will forward this note to him to see if that appointment can be arranged   Active Problems:   Postsurgical hypothyroidism - continue synthroid     Hyperlipidemia - continue home medical regimen     CHF (congestive heart failure) (HCC) - chronic diastolic CHF - no signs of volume overload - EF 55 %    Acute kidney injury - appears to be pre renal  - resolved after blood transfusion    DVT  prophylaxis: SCD's Code Status: Full  Family Communication: Patient at bedside  Disposition Plan: Home   Consultants:   Dr Lindi Adie over the phone   Procedures:   None  Antimicrobials:   None   Procedures/Studies: Dg Chest 2 View  Result Date: 05/20/2016 CLINICAL DATA:  Cough.  Shortness of breath. EXAM: CHEST  2 VIEW COMPARISON:  10/17/2015 . FINDINGS: Mediastinum hilar structures are normal. Mild cardiomegaly P Mild bilateral interstitial prominence noted. Interstitial edema and/or pneumonitis cannot be excluded. No pleural effusion or pneumothorax. IMPRESSION: 1. Cardiomegaly. 2. Mild bilateral pulmonary interstitial prominence. Mild interstitial edema and/or mild pneumonitis cannot be excluded . Electronically Signed   By: Marcello Moores  Register   On: 05/20/2016 07:48      Discharge Exam: Vitals:   05/22/16 0500 05/22/16 0919  BP: (!) 132/45 (!) 152/58  Pulse: 62 61  Resp: 18   Temp: 98.3 F (36.8 C)    Vitals:   05/21/16 2135 05/22/16 0500 05/22/16 0616 05/22/16 0919  BP: (!) 137/58 (!) 132/45  (!) 152/58  Pulse: 64 62  61  Resp:  18    Temp:  98.3 F (36.8 C)    TempSrc:  Oral    SpO2:  95%    Weight:   65.2 kg (143 lb 11.2 oz)   Height:        General: Pt is alert, follows commands appropriately, not in acute distress Cardiovascular: Regular rate and rhythm, S1/S2 +, no rubs, no gallops Respiratory: Clear to auscultation  bilaterally, no wheezing, no crackles, no rhonchi Abdominal: Soft, non tender, non distended, bowel sounds +, no guarding   Discharge Instructions  Discharge Instructions    Diet - low sodium heart healthy    Complete by:  As directed    Increase activity slowly    Complete by:  As directed      Allergies as of 05/22/2016      Reactions   Levofloxacin Other (See Comments)   Dizzy, hot, foot pain, insomnia, nausea   Percocet [oxycodone-acetaminophen] Nausea And Vomiting, Other (See Comments)   sick      Medication List    TAKE  these medications   amLODipine 5 MG tablet Commonly known as:  NORVASC Take 5 mg by mouth daily.   atorvastatin 20 MG tablet Commonly known as:  LIPITOR Take 20 mg by mouth daily.   bimatoprost 0.01 % Soln Commonly known as:  LUMIGAN Place 1 drop into both eyes at bedtime.   calcium-vitamin D 500-200 MG-UNIT tablet Commonly known as:  OSCAL WITH D Take 1 tablet by mouth 2 (two) times daily.   CENTRUM SILVER PO Take 1 tablet by mouth daily.   furosemide 40 MG tablet Commonly known as:  LASIX Take 80 mg by mouth daily.   levothyroxine 112 MCG tablet Commonly known as:  SYNTHROID, LEVOTHROID Take 112 mcg by mouth daily before breakfast.   losartan 50 MG tablet Commonly known as:  COZAAR Take 50 mg by mouth daily.   meloxicam 15 MG tablet Commonly known as:  MOBIC Take 15 mg by mouth daily.   omeprazole 20 MG capsule Commonly known as:  PRILOSEC Take 20 mg by mouth daily at 12 noon.   propranolol 20 MG tablet Commonly known as:  INDERAL Take 20 mg by mouth 3 (three) times daily.   THERATEARS OP Place 1 drop into both eyes as needed (for dry eyes).      Follow-up Information    WEBB, CAROL D, MD Follow up.   Specialty:  Family Medicine Contact information: [place] [id number] [phone]        Rulon Eisenmenger, MD Follow up.   Specialty:  Hematology and Oncology Contact information: [place] [id number] [phone]        Faye Ramsay, MD Follow up.   Specialty:  Internal Medicine Why:  call my cell phone with questions  Contact information: [address] [place] [address] [phone]            The results of significant diagnostics from this hospitalization (including imaging, microbiology, ancillary and laboratory) are listed below for reference.     Microbiology: No results found for this or any previous visit (from the past 240  hour(s)).   Labs: Basic Metabolic Panel:  Recent Labs Lab 05/20/16 0722 05/21/16 0708 05/22/16 0557  NA 140 141 140  K 3.7 3.4* 3.5  CL 107 105 105  CO2 _0 GLUCOSE 116* 93 97  BUN 24* 20 20  CREATININE 1.21* 1.01* 0.93  CALCIUM 8.3* 8.1* 8.2*   Liver Function Tests:  Recent Labs Lab 05/20/16 0722  AST 20  ALT 13*  ALKPHOS 36*  BILITOT 0.4  PROT 6.3*  ALBUMIN 3.5   CBC:  Recent Labs Lab 05/20/16 0722 05/20/16 1856 05/21/16 0708 05/22/16 0557  WBC 7.2  --  7.6 8.0  NEUTROABS 4.2  --   --   --   HGB  6.9* 10.2* 9.5* 9.3*  HCT 23.2* 33.2* 31.0* 30.7*  MCV 82.0  --  79.7 79.9  PLT 391  --  368 363    BNP (last 3 results)  Recent Labs  05/20/16 0722  BNP 427.1*   CBG:  Recent Labs Lab 05/21/16 0858  GLUCAP 100*   SIGNED: Time coordinating discharge: 30 minutes  Faye Ramsay, MD  [facility] 05/22/2016, 10:54 AM Pager [phone]  If 7PM-7AM, please contact night-coverage [email or web address] Password [email or web address]

## 2016-05-22 NOTE — Care Management Note (Signed)
Case Management Note  Patient Details  Name: Summer Nielsen MRN: [id number] Date of Birth: 1927/06/04  Subjective/Objective:                 Patient with DC order to home. Reviewed chart for CM consult/ orders/ needs, none  identified at the time of this note.    Action/Plan:  DC to home.  Expected Discharge Date:  05/22/16               Expected Discharge Plan:  Home/Self Care  In-House Referral:     Discharge planning Services  CM Consult  Post Acute Care Choice:    Choice offered to:     DME Arranged:    DME Agency:     HH Arranged:    HH Agency:     Status of Service:  Completed, signed off  If discussed at [employer] of Stay Meetings, dates discussed:    Additional Comments:  Carles Collet, RN 05/22/2016, 11:40 AM

## 2016-05-22 NOTE — Discharge Instructions (Signed)
Anemia, Nonspecific Anemia is a condition in which the concentration of red blood cells or hemoglobin in the blood is below normal. Hemoglobin is a substance in red blood cells that carries oxygen to the tissues of the body. Anemia results in not enough oxygen reaching these tissues. What are the causes? Common causes of anemia include:  Excessive bleeding. Bleeding may be internal or external. This includes excessive bleeding from periods (in women) or from the intestine.  Poor nutrition.  Chronic kidney, thyroid, and liver disease.  Bone marrow disorders that decrease red blood cell production.  Cancer and treatments for cancer.  HIV, AIDS, and their treatments.  Spleen problems that increase red blood cell destruction.  Blood disorders.  Excess destruction of red blood cells due to infection, medicines, and autoimmune disorders. What are the signs or symptoms?  Minor weakness.  Dizziness.  Headache.  Palpitations.  Shortness of breath, especially with exercise.  Paleness.  Cold sensitivity.  Indigestion.  Nausea.  Difficulty sleeping.  Difficulty concentrating. Symptoms may occur suddenly or they may develop slowly. How is this diagnosed? Additional blood tests are often needed. These help your health care provider determine the best treatment. Your health care provider will check your stool for blood and look for other causes of blood loss. How is this treated? Treatment varies depending on the cause of the anemia. Treatment can include:  Supplements of iron, vitamin B12, or folic acid.  Hormone medicines.  A blood transfusion. This may be needed if blood loss is severe.  Hospitalization. This may be needed if there is significant continual blood loss.  Dietary changes.  Spleen removal. Follow these instructions at home: Keep all follow-up appointments. It often takes many weeks to correct anemia, and having your health care provider check on your  condition and your response to treatment is very important. Get help right away if:  You develop extreme weakness, shortness of breath, or chest pain.  You become dizzy or have trouble concentrating.  You develop heavy vaginal bleeding.  You develop a rash.  You have bloody or black, tarry stools.  You faint.  You vomit up blood.  You vomit repeatedly.  You have abdominal pain.  You have a fever or persistent symptoms for more than 2-3 days.  You have a fever and your symptoms suddenly get worse.  You are dehydrated. This information is not intended to replace advice given to you by your health care provider. Make sure you discuss any questions you have with your health care provider. Document Released: 04/01/2004 Document Revised: 08/06/2015 Document Reviewed: 08/18/2012 Elsevier Interactive Patient Education  2017 Elsevier Inc.  

## 2016-05-22 NOTE — Progress Notes (Signed)
NURSING PROGRESS NOTE  Summer Nielsen [id number] Discharge Data: 05/22/2016 1:59 PM Attending Provider: Theodis Blaze, MD [id number], Valla Leaver, MD     [name] to be D/C'd Home per MD order.  Discussed with the patient the After Visit Summary and all questions fully answered. All IV's discontinued with no bleeding noted. All belongings returned to patient for patient to take home.   Last Vital Signs:  Blood pressure (!) 152/58, pulse 61, temperature 98.3 F (36.8 C), temperature source Oral, resp. rate 18, height _0  (1.575 m), weight 65.2 kg (143 lb 11.2 oz), SpO2 95 %.  Discharge Medication List Allergies as of 05/22/2016      Reactions   Levofloxacin Other (See Comments)   Dizzy, hot, foot pain, insomnia, nausea   Percocet [oxycodone-acetaminophen] Nausea And Vomiting, Other (See Comments)   sick      Medication List    TAKE these medications   amLODipine 5 MG tablet Commonly known as:  NORVASC Take 5 mg by mouth daily.   atorvastatin 20 MG tablet Commonly known as:  LIPITOR Take 20 mg by mouth daily.   bimatoprost 0.01 % Soln Commonly known as:  LUMIGAN Place 1 drop into both eyes at bedtime.   calcium-vitamin D 500-200 MG-UNIT tablet Commonly known as:  OSCAL WITH D Take 1 tablet by mouth 2 (two) times daily.   CENTRUM SILVER PO Take 1 tablet by mouth daily.   furosemide 40 MG tablet Commonly known as:  LASIX Take 80 mg by mouth daily.   levothyroxine 112 MCG tablet Commonly known as:  SYNTHROID, LEVOTHROID Take 112 mcg by mouth daily before breakfast.   losartan 50 MG tablet Commonly known as:  COZAAR Take 50 mg by mouth daily.   meloxicam 15 MG tablet Commonly known as:  MOBIC Take 15 mg by mouth daily.   omeprazole 20 MG capsule Commonly known as:  PRILOSEC Take 20 mg by mouth daily at 12 noon.   propranolol 20 MG tablet Commonly known as:  INDERAL Take 20 mg by mouth 3 (three) times daily.   THERATEARS OP Place 1 drop into both  eyes as needed (for dry eyes).

## 2016-05-26 NOTE — Progress Notes (Signed)
Cardiology Office Note    Date:  05/27/2016   ID:  Summer Nielsen, DOB 09/26/1927, MRN [id number]  PCP:  Summer Bellows, MD  Cardiologist: Dr. Marlou Nielsen  CC: dyspnea  History of Present Illness:  Summer Nielsen is a 83 y.o. female with a history of HTN, chronic diastolic CHF with secondary pulmonary HTN, HLD and TIA who presents to clinic for evaluation of worseing dyspnea.   She was seen by Dr. Marlou Nielsen in 01/2016 and doing well on Lasix 51m daily. Weight was 144 lbs. [place] had worsening dyspnea and LE edema and Dr. WJustin Nielsen(PCP) increased lasix to 445mBID and that seemed to help.   Around the first of March she started worsening dyspnea with minimal exertion like getting in and out of the shower as well as walking. She hasn't had return of swelling.   [facility] for anemia. She was at her family doctor due to weakness and dyspnea and labwork showed Hg of 6.9 and she was instructed to go to the ER. She was transfused 2U PRBCs. 2D ECHO showed normal EF (55-60%), G2DD, PA pressure 35. She was discharged home with outpatient Heme/Onc referral. Discharge weight 143 lbs.   Today she presents to clinic for evaluation of worsening dyspnea on exertion. Since admission with transfusion, her weakness has improved some but dyspnea has stayed relatively constant. No chest pain but does have some chest tightness that causes tingling into her jaw. This comes and goes periodically with no pattern to it and not related to exertion. No LE edema, orthopnea or PND. No dizziness or syncope. No blood in stool or urine. No palpitations.   Both her mother had a MI in 5035snd father had MI in 7076   Past Medical History:  Diagnosis Date  . [place] heart [place]  . Glaucoma   . High blood pressure   . Hyperlipidemia   . Hypothyroidism   . OP (osteoporosis)   . Postsurgical hypothyroidism   . Spondylosis of cervical region without myelopathy or  radiculopathy   . Tachycardia   . TIA (transient ischemic attack)     History reviewed. No pertinent surgical history.  Current Medications: Outpatient Medications Prior to Visit  Medication Sig Dispense Refill  . amLODipine (NORVASC) 5 MG tablet Take 5 mg by mouth daily.    . Marland Kitchentorvastatin (LIPITOR) 20 MG tablet Take 20 mg by mouth daily.    . bimatoprost (LUMIGAN) 0.01 % SOLN Place 1 drop into both eyes at bedtime.     . calcium-vitamin D (OSCAL WITH D) 500-200 MG-UNIT tablet Take 1 tablet by mouth 2 (two) times daily.    . Marland Kitchenevothyroxine (SYNTHROID, LEVOTHROID) 112 MCG tablet Take 112 mcg by mouth daily before breakfast.    . losartan (COZAAR) 50 MG tablet Take 50 mg by mouth daily.    . meloxicam (MOBIC) 15 MG tablet Take 15 mg by mouth daily.    . Multiple Vitamins-Minerals (CENTRUM SILVER PO) Take 1 tablet by mouth daily.     . Marland Kitchenmeprazole (PRILOSEC) 20 MG capsule Take 20 mg by mouth daily at 12 noon.     . propranolol (INDERAL) 20 MG tablet Take 20 mg by mouth 2 (two) times daily.     . Carboxymethylcellulose Sodium (THERATEARS OP) Place 1 drop into both eyes as needed (for dry eyes).    . furosemide (LASIX) 40 MG tablet Take 80 mg by mouth daily.  No facility-administered medications prior to visit.      Allergies:   Levofloxacin and Percocet [oxycodone-acetaminophen]   Social History   Social History  . Marital status: Married    Spouse name: N/A  . Number of children: N/A  . Years of education: N/A   Social History Main Topics  . Smoking status: Never Smoker  . Smokeless tobacco: Never Used  . Alcohol use No  . Drug use: No  . Sexual activity: Not Asked   Other Topics Concern  . None   Social History Narrative  . None     Family History:  The patient's family history includes Cerebral aneurysm in her mother; Heart attack in her father and mother; Heart disease in her brother and sister; Hypertension in her mother; Stroke in her mother.     ROS:   Please  see the history of present illness.    ROS All other systems reviewed and are negative.   PHYSICAL EXAM:   VS:  BP (!) 130/52   Pulse (!) 58   Ht _0  (1.575 m)   Wt 142 lb (64.4 kg)   LMP  (LMP Unknown)   BMI 25.97 kg/m    GEN: Well nourished, well developed, in no acute distress  HEENT: normal  Neck: no JVD, carotid bruits, or masses Cardiac: RRR; no murmurs, rubs, or gallops,no edema  Respiratory:  clear to auscultation bilaterally, normal work of breathing GI: soft, nontender, nondistended, + BS MS: no deformity or atrophy  Skin: warm and dry, no rash Neuro:  Alert and Oriented x 3, Strength and sensation are intact Psych: euthymic mood, full affect    Wt Readings from Last 3 Encounters:  05/27/16 142 lb (64.4 kg)  05/22/16 143 lb 11.2 oz (65.2 kg)  02/16/16 143 lb (64.9 kg)      Studies/Labs Reviewed:   EKG:  EKG is NOT ordered today.    Recent Labs: 05/20/2016: ALT 13; B Natriuretic Peptide 427.1 05/22/2016: BUN 20; Creatinine, Ser 0.93; Hemoglobin 9.3; Platelets 363; Potassium 3.5; Sodium 140   Lipid Panel No results found for: CHOL, TRIG, HDL, CHOLHDL, VLDL, LDLCALC, LDLDIRECT  Additional studies/ records that were reviewed today include:  ECHO 10/24/15: - Left ventricle: The cavity size was normal. Systolic function was normal. The estimated ejection fraction was in the range of 60% to 65%. Wall motion was normal; there were no regional wall motion abnormalities. Features are consistent with a pseudonormal left ventricular filling pattern, with concomitant abnormal relaxation and increased filling pressure (grade 2 diastolic dysfunction). - Aortic valve: There was mild regurgitation. - Mitral valve: Calcified annulus. - Pulmonary arteries: Systolic pressure was mildly increased. PA peak pressure: 34 mm Hg (S).   ASSESSMENT & PLAN:   Dyspnea on exertion: appears euvolemic today but will check a BNP to rule out occult CHF.  Also will  recheck a CBC to make sure blood counts are stable. If both of these are normal will plan for stress testing as she does have RFs for CAD and a family history of CAD.   Chronic diastolic CHF: appears euvolemic. Continue lasix 78m BID. Check BNP as above .  Anemia: recently admitted and transfused 2 units. Hemoglobin down to 6.9. Discharge H/H 9.3/30.7. Will check CBC today  HTN: BP well controlled.   HLD: continue statin   CKD: followed by Dr. WJustin Nielsen Creat down to 0.93 on 05/22/16  Medication Adjustments/Labs and Tests Ordered: Current medicines are reviewed at length with the patient today.  Concerns regarding medicines are outlined above.  Medication changes, Labs and Tests ordered today are listed in the Patient Instructions below. Patient Instructions  Medication Instructions:  Your physician recommends that you continue on your current medications as directed. Please refer to the Current Medication list given to you today.   Labwork: TODAY:  BMET, PRO BNP, & CBC  Testing/Procedures: None ordered  Follow-Up: Your physician recommends that you schedule a follow-up appointment in: [place], PA-C   Any Other Special Instructions Will Be Listed Below (If Applicable).   If you need a refill on your cardiac medications before your next appointment, please call your pharmacy.      Signed, Angelena Form, PA-C  05/27/2016 11:19 AM    [facility] [address], [place], [place]  [address] Phone: [phone]; Fax: [phone]

## 2016-05-27 ENCOUNTER — Encounter (INDEPENDENT_AMBULATORY_CARE_PROVIDER_SITE_OTHER): Payer: Self-pay

## 2016-05-27 ENCOUNTER — Ambulatory Visit (INDEPENDENT_AMBULATORY_CARE_PROVIDER_SITE_OTHER): Payer: Medicare Other | Admitting: Physician Assistant

## 2016-05-27 ENCOUNTER — Encounter: Payer: Self-pay | Admitting: Physician Assistant

## 2016-05-27 VITALS — BP 130/52 | HR 58 | Ht 62.0 in | Wt 142.0 lb

## 2016-05-27 DIAGNOSIS — E785 Hyperlipidemia, unspecified: Secondary | ICD-10-CM

## 2016-05-27 DIAGNOSIS — N189 Chronic kidney disease, unspecified: Secondary | ICD-10-CM

## 2016-05-27 DIAGNOSIS — R0600 Dyspnea, unspecified: Secondary | ICD-10-CM | POA: Diagnosis not present

## 2016-05-27 DIAGNOSIS — I10 Essential (primary) hypertension: Secondary | ICD-10-CM | POA: Diagnosis not present

## 2016-05-27 DIAGNOSIS — I5032 Chronic diastolic (congestive) heart failure: Secondary | ICD-10-CM | POA: Diagnosis not present

## 2016-05-27 LAB — BASIC METABOLIC PANEL
BUN/Creatinine Ratio: 25 (ref 12–28)
BUN: 37 mg/dL — AB (ref 8–27)
CHLORIDE: 96 mmol/L (ref 96–106)
CO2: 24 mmol/L (ref 18–29)
Calcium: 9.5 mg/dL (ref 8.7–10.3)
Creatinine, Ser: 1.49 mg/dL — ABNORMAL HIGH (ref 0.57–1.00)
GFR calc Af Amer: 36 mL/min/{1.73_m2} — ABNORMAL LOW (ref >59)
GFR calc non Af Amer: 31 mL/min/{1.73_m2} — ABNORMAL LOW (ref >59)
GLUCOSE: 92 mg/dL (ref 65–99)
POTASSIUM: 5.1 mmol/L (ref 3.5–5.2)
Sodium: 140 mmol/L (ref 134–144)

## 2016-05-27 LAB — CBC
HEMATOCRIT: 33.5 % — AB (ref 34.0–46.6)
HEMOGLOBIN: 10.1 g/dL — AB (ref 11.1–15.9)
MCH: 24.3 pg — ABNORMAL LOW (ref 26.6–33.0)
MCHC: 30.1 g/dL — ABNORMAL LOW (ref 31.5–35.7)
MCV: 81 fL (ref 79–97)
Platelets: 434 10*3/uL — ABNORMAL HIGH (ref 150–379)
RBC: 4.16 x10E6/uL (ref 3.77–5.28)
RDW: 16.7 % — ABNORMAL HIGH (ref 12.3–15.4)
WBC: 8.8 10*3/uL (ref 3.4–10.8)

## 2016-05-27 LAB — PRO B NATRIURETIC PEPTIDE: NT-PRO BNP: 354 pg/mL (ref 0–738)

## 2016-05-27 NOTE — Patient Instructions (Addendum)
Medication Instructions:  Your physician recommends that you continue on your current medications as directed. Please refer to the Current Medication list given to you today.   Labwork: TODAY:  BMET, PRO BNP, & CBC  Testing/Procedures: None ordered  Follow-Up: Your physician recommends that you schedule a follow-up appointment in: [place], PA-C   Any Other Special Instructions Will Be Listed Below (If Applicable).   If you need a refill on your cardiac medications before your next appointment, please call your pharmacy.

## 2016-05-28 ENCOUNTER — Telehealth: Payer: Self-pay | Admitting: *Deleted

## 2016-05-28 DIAGNOSIS — R0609 Other forms of dyspnea: Principal | ICD-10-CM

## 2016-05-28 NOTE — Telephone Encounter (Signed)
-----  Message from Eileen Stanford, PA-C sent at 05/27/2016  4:01 PM EDT ----- Blood counts look good. Hg at 10. Her BNP is normal so I don't think this is from heart failure. Kidney function is a little up from previous. I will recheck kidney function and next visit.  Lets set up a lexiscan myoview (stress test) to further work up her exertional shortness of breath.

## 2016-05-28 NOTE — Telephone Encounter (Signed)
Pt has been made aware of her lab results. She is aware that we will order [place] for doe. Pt aware that the order will be placed and someone from our scheduling dept will call pt to set up. Pt was given verbal instructions for the Lexiscan. Pt verbalized understanding.

## 2016-06-02 ENCOUNTER — Telehealth (HOSPITAL_COMMUNITY): Payer: Self-pay | Admitting: *Deleted

## 2016-06-02 NOTE — Telephone Encounter (Signed)
Patient given detailed instructions per Myocardial Perfusion Study Information Sheet for the test on  06/07/16. Patient notified to arrive 15 minutes early and that it is imperative to arrive on time for appointment to keep from having the test rescheduled.  If you need to cancel or reschedule your appointment, please call the office within 24 hours of your appointment. Failure to do so may result in a cancellation of your appointment, and a $50 no show fee. Patient verbalized understanding. Kirstie Peri  \

## 2016-06-04 ENCOUNTER — Encounter (HOSPITAL_COMMUNITY): Payer: Medicare Other

## 2016-06-07 ENCOUNTER — Ambulatory Visit (HOSPITAL_COMMUNITY): Payer: Medicare Other | Attending: Cardiovascular Disease

## 2016-06-07 DIAGNOSIS — I2510 Atherosclerotic heart disease of native coronary artery without angina pectoris: Secondary | ICD-10-CM | POA: Diagnosis present

## 2016-06-07 DIAGNOSIS — R0609 Other forms of dyspnea: Secondary | ICD-10-CM

## 2016-06-07 DIAGNOSIS — Z8673 Personal history of transient ischemic attack (TIA), and cerebral infarction without residual deficits: Secondary | ICD-10-CM | POA: Insufficient documentation

## 2016-06-07 DIAGNOSIS — I10 Essential (primary) hypertension: Secondary | ICD-10-CM | POA: Diagnosis not present

## 2016-06-07 LAB — MYOCARDIAL PERFUSION IMAGING
LHR: 0.31
LVDIAVOL: 88 mL (ref 46–106)
LVSYSVOL: 26 mL
Peak HR: 78 {beats}/min
Rest HR: 60 {beats}/min
SDS: 1
SRS: 1
SSS: 2
TID: 0.89

## 2016-06-07 MED ORDER — TECHNETIUM TC 99M TETROFOSMIN IV KIT
10.4000 | PACK | Freq: Once | INTRAVENOUS | Status: AC | PRN
  Administered 2016-06-07: 10.4 via INTRAVENOUS
  Filled 2016-06-07: qty 11

## 2016-06-07 MED ORDER — REGADENOSON 0.4 MG/5ML IV SOLN
0.4000 mg | Freq: Once | INTRAVENOUS | Status: AC
  Administered 2016-06-07: 0.4 mg via INTRAVENOUS

## 2016-06-07 MED ORDER — TECHNETIUM TC 99M TETROFOSMIN IV KIT
32.5000 | PACK | Freq: Once | INTRAVENOUS | Status: AC | PRN
Start: 2016-06-07 — End: 2016-06-07
  Administered 2016-06-07: 32.5 via INTRAVENOUS
  Filled 2016-06-07: qty 33

## 2016-06-08 NOTE — Progress Notes (Signed)
Cardiology Office Note    Date:  06/10/2016   ID:  Kristien Salatino, DOB [birth year], MRN [id number]  PCP:  Jonathon Bellows, MD  Cardiologist: Dr. Marlou Porch  CC: follow up dyspnea  History of Present Illness:  Summer Nielsen is a 83 y.o. female with a history of HTN, chronic diastolic CHF with secondary pulmonary HTN, HLD and TIA who presents to clinic for evaluation of worseing dyspnea.   She was seen by Dr. Marlou Porch in 01/2016 and doing well on Lasix 62m daily. Weight was 144 lbs. [place] she had worsening dyspnea and LE edema and Dr. WJustin Mend(PCP) increased lasix to 436mBID and that seemed to help.   Around the first of March she started worsening dyspnea with minimal exertion like getting in and out of the shower as well as walking. She hasn't had return of swelling.   [facility]. She was at her family doctor due to weakness and dyspnea and labwork showed Hg of 6.9 and she was instructed to go to the ER. She was transfused 2U PRBCs. 2D ECHO showed normal EF (55-60%), G2DD, PA pressure 35. She was discharged home with outpatient Heme/Onc referral. Discharge weight 143 lbs.   I saw her in clinic on 05/27/16 for evaluation of worsening dyspnea on exertion. Her weakness had improved some but dyspnea had stayed relatively constant. She appeared euvolemic and BNP was normal. I also checked her Hg which was stable. I set up a lexiscan myoview which was completed 4/2 and low risk for ischemia, EF 71%.  Today she presents to clinic for follow up. She still feels totally wiped out and sob. Dyspnea much worse on exertion. Just knows "something is wrong. " No CP. No LE edema, orthopnea or PND. No dizziness or syncope. No blood in stool or urine. No palpitations.      Past Medical History:  Diagnosis Date  . [place]  . Glaucoma   . High blood pressure   . Hyperlipidemia   . Hypothyroidism   . OP (osteoporosis)    . Postsurgical hypothyroidism   . 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 Tachycardia   . TIA (transient ischemic attack)     No past surgical history on file.  Current Medications: Outpatient Medications Prior to Visit  Medication Sig Dispense Refill  . amLODipine (NORVASC) 5 MG tablet Take 5 mg by mouth daily.    . Marland Kitchentorvastatin (LIPITOR) 20 MG tablet Take 20 mg by mouth daily.    . bimatoprost (LUMIGAN) 0.01 % SOLN Place 1 drop into both eyes at bedtime.     . calcium-vitamin D (OSCAL WITH D) 500-200 MG-UNIT tablet Take 1 tablet by mouth 2 (two) times daily.    . furosemide (LASIX) 40 MG tablet Take 40 mg by mouth 2 (two) times daily.    . Marland Kitchenevothyroxine (SYNTHROID, LEVOTHROID) 112 MCG tablet Take 112 mcg by mouth daily before breakfast.    . losartan (COZAAR) 50 MG tablet Take 50 mg by mouth daily.    . meloxicam (MOBIC) 15 MG tablet Take 15 mg by mouth daily.    . Multiple Vitamins-Minerals (CENTRUM SILVER PO) Take 1 tablet by mouth daily.     . Marland Kitchenmeprazole (PRILOSEC) 20 MG capsule Take 20 mg by mouth daily at 12 noon.     . propranolol (INDERAL) 20 MG tablet Take 20 mg by mouth 2 (two) times daily.  No facility-administered medications prior to visit.      Allergies:   Levofloxacin and Percocet [oxycodone-acetaminophen]   Social History   Social History  . Marital status: Married    Spouse name: N/A  . Number of children: N/A  . Years of education: N/A   Social History Main Topics  . Smoking status: Never Smoker  . Smokeless tobacco: Never Used  . Alcohol use No  . Drug use: No  . Sexual activity: Not Asked   Other Topics Concern  . None   Social History Narrative  . None     Family History:  The patient's family history includes Cerebral aneurysm in her mother; Heart attack in her father and mother; Heart disease in her brother and sister; Hypertension in her mother; Stroke in her mother.      ROS:   Please see the history  of present illness.    ROS All other systems reviewed and are negative.   PHYSICAL EXAM:   VS:  BP (!) 148/68   Pulse 78   Ht _0  (1.575 m)   Wt 142 lb 1.9 oz (64.5 kg)   LMP  (LMP Unknown)   BMI 25.99 kg/m    GEN: Well nourished, well developed, in no acute distress  HEENT: normal  Neck: no JVD, carotid bruits, or masses Cardiac: RRR; no murmurs, rubs, or gallops,no edema  Respiratory:  clear to auscultation bilaterally, normal work of breathing GI: soft, nontender, nondistended, + BS MS: no deformity or atrophy  Skin: warm and dry, no rash Neuro:  Alert and Oriented x 3, Strength and sensation are intact Psych: euthymic mood, full affect   Wt Readings from Last 3 Encounters:  06/10/16 142 lb 1.9 oz (64.5 kg)  06/07/16 142 lb (64.4 kg)  05/27/16 142 lb (64.4 kg)      Studies/Labs Reviewed:   EKG:  EKG is NOT ordered today.    Recent Labs: 05/20/2016: ALT 13; B Natriuretic Peptide 427.1 05/22/2016: Hemoglobin 9.3 05/27/2016: BUN 37; Creatinine, Ser 1.49; NT-Pro BNP 354; Platelets 434; Potassium 5.1; Sodium 140   Lipid Panel No results found for: CHOL, TRIG, HDL, CHOLHDL, VLDL, LDLCALC, LDLDIRECT  Additional studies/ records that were reviewed today include:  ECHO 10/24/15: - Left ventricle: The cavity size was normal. Systolic function was normal. The estimated ejection fraction was in the range of 60% to 65%. Wall motion was normal; there were no regional wall motion abnormalities. Features are consistent with a pseudonormal left ventricular filling pattern, with concomitant abnormal relaxation and increased filling pressure (grade 2 diastolic dysfunction). - Aortic valve: There was mild regurgitation. - Mitral valve: Calcified annulus. - Pulmonary arteries: Systolic pressure was mildly increased. PA peak pressure: 34 mm Hg (S).  Myoview 06/07/16   Nuclear stress EF: 71%.  No T wave inversion was noted during stress.  There was no ST segment  deviation noted during stress.  This is a low risk study.   No reversible ischemia. LVEF 71% with normal wall motion. This is a low risk study.      ASSESSMENT & PLAN:   Dyspnea: PE? She is not tachycardic and ambulatory 02 sats in office today were god (91%--> 98%). Will get CT chest to rule out PE and also look at lungs. May need referral to pulm. She has no evidence of volume overload and stress test low risk. I don't think SOB coming from heart.   Chronic diastolic CHF: appears euvolemic. Recent BNP totally normal. Continue current dose  of lasix.   Anemia: her blood counts were stable H/H 10.1/33.5 on 05/27/16. referall to heme/onc never placed in hospital, so we will take care of this today   HTN: BP with moderate elevation today. No changes made.   CKD: creat was up to 1.49 last check. Will check BMET before CT angio.    Medication Adjustments/Labs and Tests Ordered: Current medicines are reviewed at length with the patient today.  Concerns regarding medicines are outlined above.  Medication changes, Labs and Tests ordered today are listed in the Patient Instructions below. Patient Instructions  Medication Instructions:  Your physician recommends that you continue on your current medications as directed. Please refer to the Current Medication list given to you today.   Labwork: BMET  Testing/Procedures: Non-Cardiac CT Angiography (CTA), is a special type of CT scan that uses a computer to produce multi-dimensional views of major blood vessels throughout the body. In CT angiography, a contrast material is injected through an IV to help visualize the blood vessels    You have been referred to DR. GUDENA, HEMATOLOGY.    Follow-Up: Your physician recommends that you schedule a follow-up appointment in: 3 MONTHS WITH DR. Marlou Porch   Any Other Special Instructions Will Be Listed Below (If Applicable).  CT Angiogram A CT angiogram is a procedure to look at the blood vessels  in various areas of the body. For this procedure, a large X-ray machine, called a CT scanner, takes detailed pictures of blood vessels that have been injected with a dye (contrast material). A CT angiogram allows your health care provider to see how well blood is flowing to the area of your body that is being checked. Your health care provider will be able to see if there are any problems, such as a blockage. Tell a health care provider about:  Any allergies you have.  All medicines you are taking, including vitamins, herbs, eye drops, creams, and over-the-counter medicines.  Any problems you or family members have had with anesthetic medicines.  Any blood disorders you have.  Any surgeries you have had.  Any medical conditions you have.  Whether you are pregnant or may be pregnant.  Whether you are breastfeeding.  Any anxiety disorders, chronic pain, or other conditions you have that may increase your stress or prevent you from lying still. What are the risks? Generally, this is a safe procedure. However, problems may occur, including:  Infection.  Bleeding.  Allergic reactions to medicines or dyes.  Damage to other structures or organs.  Kidney damage from the dye or contrast that is used.  Increased risk of cancer from radiation exposure. This risk is low. Talk with your health care provider about:  The risks and benefits of testing.  How you can receive the lowest dose of radiation. What happens before the procedure?  Wear comfortable clothing and remove any jewelry.  Follow instructions from your health care provider about eating and drinking. For most people, instructions may include these actions:  For 12 hours before the test, avoid caffeine. This includes tea, coffee, soda, and energy drinks or pills.  For 3-4 hours before the test, stop eating or drinking anything but water.  Stay well hydrated by continuing to drink water before the exam. This will help to  clear the contrast dye from your body after the test.  Ask your health care provider about changing or stopping your regular medicines. This is especially important if you are taking diabetes medicines or blood thinners. What happens  during the procedure?  An IV tube will be inserted into one of your veins.  You will be asked to lie on an exam table. This table will slide in and out of the CT machine during the procedure.  Contrast dye will be injected into the IV tube. You might feel warm, or you may get a metallic taste in your mouth.  The table that you are lying on will move into the CT machine tunnel for the scan.  The person running the machine will give you instructions while the scans are being done. You may be asked to:  Keep your arms above your head.  Hold your breath.  Stay very still, even if the table is moving.  When the scanning is complete, you will be moved out of the machine.  The IV tube will be removed. The procedure may vary among health care providers and hospitals. What happens after the procedure?  You might feel warm, or you may get a metallic taste in your mouth.  You may be asked to drink water or other fluids to wash (flush) the contrast material out of your body.  It is up to you to get the results of your procedure. Ask your health care provider, or the department that is doing the procedure, when your results will be ready. Summary  A CT angiogram is a procedure to look at the blood vessels in various areas of the body.  You will need to stay very still during the exam.  You may be asked to drink water or other fluids to wash (flush) the contrast material out of your body after your scan. This information is not intended to replace advice given to you by your health care provider. Make sure you discuss any questions you have with your health care provider. Document Released: 10/23/2015 Document Revised: 10/23/2015 Document Reviewed:  10/23/2015 Elsevier Interactive Patient Education  2017 [employer].     If you need a refill on your cardiac medications before your next appointment, please call your pharmacy.      Signed, Angelena Form, PA-C  06/10/2016 12:26 PM    [facility] [address], [place], [place]  [address] Phone: [phone]; Fax: [phone]

## 2016-06-10 ENCOUNTER — Encounter: Payer: Self-pay | Admitting: Physician Assistant

## 2016-06-10 ENCOUNTER — Other Ambulatory Visit: Payer: Self-pay | Admitting: Physician Assistant

## 2016-06-10 ENCOUNTER — Ambulatory Visit (INDEPENDENT_AMBULATORY_CARE_PROVIDER_SITE_OTHER): Payer: Medicare Other | Admitting: Physician Assistant

## 2016-06-10 VITALS — BP 148/68 | HR 78 | Ht 62.0 in | Wt 142.1 lb

## 2016-06-10 DIAGNOSIS — N189 Chronic kidney disease, unspecified: Secondary | ICD-10-CM

## 2016-06-10 DIAGNOSIS — E785 Hyperlipidemia, unspecified: Secondary | ICD-10-CM | POA: Diagnosis not present

## 2016-06-10 DIAGNOSIS — R0602 Shortness of breath: Secondary | ICD-10-CM | POA: Diagnosis not present

## 2016-06-10 DIAGNOSIS — D649 Anemia, unspecified: Secondary | ICD-10-CM | POA: Diagnosis not present

## 2016-06-10 DIAGNOSIS — I5032 Chronic diastolic (congestive) heart failure: Secondary | ICD-10-CM

## 2016-06-10 DIAGNOSIS — I10 Essential (primary) hypertension: Secondary | ICD-10-CM | POA: Diagnosis not present

## 2016-06-10 NOTE — Addendum Note (Signed)
Addended by: Eulis Foster on: 06/10/2016 01:43 PM   Modules accepted: Orders

## 2016-06-10 NOTE — Patient Instructions (Addendum)
Medication Instructions:  Your physician recommends that you continue on your current medications as directed. Please refer to the Current Medication list given to you today.   Labwork: None ordered  Testing/Procedures: Non-Cardiac CT Angiography (CTA), is a special type of CT scan that uses a computer to produce multi-dimensional views of major blood vessels throughout the body. In CT angiography, a contrast material is injected through an IV to help visualize the blood vessels    You have been referred to DR. GUDENA, HEMATOLOGY.    Follow-Up: Your physician recommends that you schedule a follow-up appointment in: 3 MONTHS WITH DR. Marlou Porch   Any Other Special Instructions Will Be Listed Below (If Applicable).  CT Angiogram A CT angiogram is a procedure to look at the blood vessels in various areas of the body. For this procedure, a large X-ray machine, called a CT scanner, takes detailed pictures of blood vessels that have been injected with a dye (contrast material). A CT angiogram allows your health care provider to see how well blood is flowing to the area of your body that is being checked. Your health care provider will be able to see if there are any problems, such as a blockage. Tell a health care provider about:  Any allergies you have.  All medicines you are taking, including vitamins, herbs, eye drops, creams, and over-the-counter medicines.  Any problems you or family members have had with anesthetic medicines.  Any blood disorders you have.  Any surgeries you have had.  Any medical conditions you have.  Whether you are pregnant or may be pregnant.  Whether you are breastfeeding.  Any anxiety disorders, chronic pain, or other conditions you have that may increase your stress or prevent you from lying still. What are the risks? Generally, this is a safe procedure. However, problems may occur, including:  Infection.  Bleeding.  Allergic reactions to medicines  or dyes.  Damage to other structures or organs.  Kidney damage from the dye or contrast that is used.  Increased risk of cancer from radiation exposure. This risk is low. Talk with your health care provider about:  The risks and benefits of testing.  How you can receive the lowest dose of radiation. What happens before the procedure?  Wear comfortable clothing and remove any jewelry.  Follow instructions from your health care provider about eating and drinking. For most people, instructions may include these actions:  For 12 hours before the test, avoid caffeine. This includes tea, coffee, soda, and energy drinks or pills.  For 3-4 hours before the test, stop eating or drinking anything but water.  Stay well hydrated by continuing to drink water before the exam. This will help to clear the contrast dye from your body after the test.  Ask your health care provider about changing or stopping your regular medicines. This is especially important if you are taking diabetes medicines or blood thinners. What happens during the procedure?  An IV tube will be inserted into one of your veins.  You will be asked to lie on an exam table. This table will slide in and out of the CT machine during the procedure.  Contrast dye will be injected into the IV tube. You might feel warm, or you may get a metallic taste in your mouth.  The table that you are lying on will move into the CT machine tunnel for the scan.  The person running the machine will give you instructions while the scans are being done. You  may be asked to:  Keep your arms above your head.  Hold your breath.  Stay very still, even if the table is moving.  When the scanning is complete, you will be moved out of the machine.  The IV tube will be removed. The procedure may vary among health care providers and hospitals. What happens after the procedure?  You might feel warm, or you may get a metallic taste in your  mouth.  You may be asked to drink water or other fluids to wash (flush) the contrast material out of your body.  It is up to you to get the results of your procedure. Ask your health care provider, or the department that is doing the procedure, when your results will be ready. Summary  A CT angiogram is a procedure to look at the blood vessels in various areas of the body.  You will need to stay very still during the exam.  You may be asked to drink water or other fluids to wash (flush) the contrast material out of your body after your scan. This information is not intended to replace advice given to you by your health care provider. Make sure you discuss any questions you have with your health care provider. Document Released: 10/23/2015 Document Revised: 10/23/2015 Document Reviewed: 10/23/2015 Elsevier Interactive Patient Education  2017 [employer].     If you need a refill on your cardiac medications before your next appointment, please call your pharmacy.

## 2016-06-11 ENCOUNTER — Encounter: Payer: Self-pay | Admitting: Hematology and Oncology

## 2016-06-11 ENCOUNTER — Ambulatory Visit (INDEPENDENT_AMBULATORY_CARE_PROVIDER_SITE_OTHER)
Admission: RE | Admit: 2016-06-11 | Discharge: 2016-06-11 | Disposition: A | Discharge status: Home / Self Care | Payer: Medicare Other | Source: Ambulatory Visit | Attending: Physician Assistant | Admitting: Physician Assistant

## 2016-06-11 ENCOUNTER — Telehealth: Payer: Self-pay | Admitting: Hematology and Oncology

## 2016-06-11 DIAGNOSIS — R0602 Shortness of breath: Secondary | ICD-10-CM

## 2016-06-11 LAB — BASIC METABOLIC PANEL
BUN/Creatinine Ratio: 29 — ABNORMAL HIGH (ref 12–28)
BUN: 38 mg/dL — AB (ref 8–27)
CO2: 28 mmol/L (ref 18–29)
CREATININE: 1.29 mg/dL — AB (ref 0.57–1.00)
Calcium: 8.4 mg/dL — ABNORMAL LOW (ref 8.7–10.3)
Chloride: 99 mmol/L (ref 96–106)
GFR calc Af Amer: 43 mL/min/{1.73_m2} — ABNORMAL LOW (ref >59)
GFR, EST NON AFRICAN AMERICAN: 37 mL/min/{1.73_m2} — AB (ref >59)
GLUCOSE: 119 mg/dL — AB (ref 65–99)
Potassium: 4.3 mmol/L (ref 3.5–5.2)
Sodium: 143 mmol/L (ref 134–144)

## 2016-06-11 MED ORDER — IOPAMIDOL (ISOVUE-370) INJECTION 76%
75.0000 mL | Freq: Once | INTRAVENOUS | Status: AC | PRN
  Administered 2016-06-11: 75 mL via INTRAVENOUS

## 2016-06-11 NOTE — Telephone Encounter (Signed)
Appt has been scheduled for the pt to see Dr. Lindi Adie on 4/26 at 1pm. Pt aware to arrive 30 minutes early. Voied understanding. Letter mailed.

## 2016-06-14 ENCOUNTER — Telehealth: Payer: Self-pay | Admitting: Cardiology

## 2016-06-14 ENCOUNTER — Telehealth: Payer: Self-pay | Admitting: *Deleted

## 2016-06-14 DIAGNOSIS — R0602 Shortness of breath: Secondary | ICD-10-CM

## 2016-06-14 NOTE — Telephone Encounter (Signed)
New Message   pt verbalized that she is returning call for rn   About pulmonary physician

## 2016-06-14 NOTE — Telephone Encounter (Signed)
-----  Message from Eileen Stanford, [place] sent at 06/11/2016  9:09 PM EDT ----- Arloa Koh get PFTs with [facility] and refer her to pulmonary. I don't think her heart is causing her SOB.

## 2016-06-14 NOTE — Telephone Encounter (Signed)
Pt aware of the CTA. She is agreeable with the plan of PFT's and ref to Pulmonary. Orders in EPIC. Pt verbalized understanding that someone will call her to arrange these appts.

## 2016-06-14 NOTE — Telephone Encounter (Signed)
Follow Up:    Returning your call.

## 2016-06-15 NOTE — Telephone Encounter (Signed)
Returned pts call.  She wanted to let Nell Range, PA-C know that she is going to hold off on seeing Pulmonary, that the GI dr thinks her sob is coming from her blood loss.  She has a couple more test with them to complete and she would like to r/o that first.

## 2016-06-21 NOTE — Telephone Encounter (Signed)
Sounds good.

## 2016-07-01 ENCOUNTER — Encounter: Payer: Self-pay | Admitting: Hematology and Oncology

## 2016-07-01 ENCOUNTER — Ambulatory Visit (HOSPITAL_BASED_OUTPATIENT_CLINIC_OR_DEPARTMENT_OTHER): Payer: Medicare Other

## 2016-07-01 ENCOUNTER — Ambulatory Visit (HOSPITAL_BASED_OUTPATIENT_CLINIC_OR_DEPARTMENT_OTHER): Payer: Medicare Other | Admitting: Hematology and Oncology

## 2016-07-01 VITALS — BP 145/37 | HR 70 | Temp 97.9°F | Resp 18 | Ht 62.0 in | Wt 144.4 lb

## 2016-07-01 DIAGNOSIS — D509 Iron deficiency anemia, unspecified: Secondary | ICD-10-CM | POA: Diagnosis not present

## 2016-07-01 DIAGNOSIS — R000 Tachycardia, unspecified: Secondary | ICD-10-CM

## 2016-07-01 DIAGNOSIS — E039 Hypothyroidism, unspecified: Secondary | ICD-10-CM

## 2016-07-01 LAB — CBC & DIFF AND RETIC
BASO%: 0.4 % (ref 0.0–2.0)
Basophils Absolute: 0 10*3/uL (ref 0.0–0.1)
EOS%: 6.6 % (ref 0.0–7.0)
Eosinophils Absolute: 0.5 10*3/uL (ref 0.0–0.5)
HCT: 30.4 % — ABNORMAL LOW (ref 34.8–46.6)
HGB: 9.4 g/dL — ABNORMAL LOW (ref 11.6–15.9)
IMMATURE RETIC FRACT: 12 % — AB (ref 1.60–10.00)
LYMPH#: 1.6 10*3/uL (ref 0.9–3.3)
LYMPH%: 22.5 % (ref 14.0–49.7)
MCH: 27 pg (ref 25.1–34.0)
MCHC: 30.9 g/dL — ABNORMAL LOW (ref 31.5–36.0)
MCV: 87.4 fL (ref 79.5–101.0)
MONO#: 0.8 10*3/uL (ref 0.1–0.9)
MONO%: 11.4 % (ref 0.0–14.0)
NEUT%: 59.1 % (ref 38.4–76.8)
NEUTROS ABS: 4.3 10*3/uL (ref 1.5–6.5)
Platelets: 336 10*3/uL (ref 145–400)
RBC: 3.48 10*6/uL — AB (ref 3.70–5.45)
RDW: 23.2 % — AB (ref 11.2–14.5)
RETIC %: 2.78 % — AB (ref 0.70–2.10)
RETIC CT ABS: 96.74 10*3/uL — AB (ref 33.70–90.70)
WBC: 7.3 10*3/uL (ref 3.9–10.3)

## 2016-07-01 LAB — IRON AND TIBC
%SAT: 100 % — AB (ref 21–57)
IRON: 334 ug/dL — AB (ref 41–142)
TIBC: 334 ug/dL (ref 236–444)
UIBC: <1 ug/dL (ref 120–384)

## 2016-07-01 LAB — FERRITIN: FERRITIN: 33 ng/mL (ref 9–269)

## 2016-07-01 NOTE — Assessment & Plan Note (Addendum)
Chronic anemia since at least 20 years. Patient had blood transfusion 20 years ago and then 5 years ago and then most recently in March 2018 when she presented with hemoglobin of 6.9. She has seen Dr. Watt Climes with gastroenterology who performed upper endoscopy and did not find any cause of bleeding. She was started on fusion plus iron replacement therapy 06/06/2016. She is tolerating it extremely well.  Plan: 1. Recheck CBC with differential and reticulocyte count as well as iron studies. Today's CBC revealed a hemoglobin of 9.4 with an MCV of 87 iron studies are pending. Reticulocyte count is high which is appropriate for the degree of anemia. It suggests a good bone marrow function.  2. we do not plan to give her any iron infusion at this time. I will see her back in 2 months with blood work. If it's remains around 9.3 or 9.4 then we can watch and monitor. If it drops below 8 then we will have to plan iron infusions.  Return to clinic in 2 months with labs done the day before was

## 2016-07-01 NOTE — Progress Notes (Signed)
[facility] NOTE  Patient Care Team: Maurice Small, MD as PCP - General (Family Medicine)  CHIEF COMPLAINTS/PURPOSE OF CONSULTATION:  Recurrent anemia  HISTORY OF PRESENTING ILLNESS:  Summer Nielsen 83 y.o. female is here because of recent diagnosis of severe recurrent anemia. Patient has a long-standing history of anemia dating back to at least 20 years ago. She received blood transfusions and subsequently 5 years ago she also got blood transfusion and most recently in March 2018 she received blood transfusion for hemoglobin of 6.9. She was recently very short of breath and initially the thought this was cardiac in origin. After the blood transfusion was done her symptoms have improved remarkably. She has seen gastroenterology who did an upper endoscopy and did not identify any bleeding. She was put on oral iron therapy for the past 3 weeks. She has been tolerating oral iron fairly well.  I reviewed her records extensively and collaborated the history with the patient.  MEDICAL HISTORY:  Past Medical History:  Diagnosis Date  . CHF (congestive heart failure) ([facility])   . Glaucoma   . High blood pressure   . Hyperlipidemia   . Hypothyroidism   . OP (osteoporosis)   . Postsurgical hypothyroidism   . 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 Tachycardia   . TIA (transient ischemic attack)     SURGICAL HISTORY: No past surgical history on file.  SOCIAL HISTORY: Social History   Social History  . Marital status: Married    Spouse name: N/A  . Number of children: N/A  . Years of education: N/A   Occupational History  . Not on file.   Social History Main Topics  . Smoking status: Never Smoker  . Smokeless tobacco: Never Used  . Alcohol use No  . Drug use: No  . Sexual activity: Not on file   Other Topics Concern  . Not on file   Social History Narrative  . No narrative on file    FAMILY HISTORY: Family History   Problem Relation Age of Onset  . Heart attack Mother   . Cerebral aneurysm Mother   . Stroke Mother   . Hypertension Mother   . Heart attack Father   . Heart disease Sister   . Heart disease Brother     ALLERGIES:  is allergic to levofloxacin and percocet [oxycodone-acetaminophen].  MEDICATIONS:  Current Outpatient Prescriptions  Medication Sig Dispense Refill  . amLODipine (NORVASC) 5 MG tablet Take 5 mg by mouth daily.    Marland Kitchen atorvastatin (LIPITOR) 20 MG tablet Take 20 mg by mouth daily.    . bimatoprost (LUMIGAN) 0.01 % SOLN Place 1 drop into both eyes at bedtime.     . calcium-vitamin D (OSCAL WITH D) 500-200 MG-UNIT tablet Take 1 tablet by mouth 2 (two) times daily.    . furosemide (LASIX) 40 MG tablet Take 40 mg by mouth 2 (two) times daily.    Marland Kitchen levothyroxine (SYNTHROID, LEVOTHROID) 112 MCG tablet Take 112 mcg by mouth daily before breakfast.    . losartan (COZAAR) 50 MG tablet Take 50 mg by mouth daily.    . meloxicam (MOBIC) 15 MG tablet Take 15 mg by mouth daily.    . Multiple Vitamins-Minerals (CENTRUM SILVER PO) Take 1 tablet by mouth daily.     Marland Kitchen omeprazole (PRILOSEC) 20 MG capsule Take 20 mg by mouth daily at 12 noon.     . propranolol (INDERAL) 20 MG tablet Take 20 mg by  mouth 2 (two) times daily.      No current facility-administered medications for this visit.     REVIEW OF SYSTEMS:   Constitutional: Denies fevers, chills or abnormal night sweats Eyes: Denies blurriness of vision, double vision or watery eyes Ears, nose, mouth, throat, and face: Denies mucositis or sore throat Respiratory: Denies cough, dyspnea or wheezes Cardiovascular: Denies palpitation, chest discomfort or lower extremity swelling Gastrointestinal:  Denies nausea, heartburn or change in bowel habits Skin: Denies abnormal skin rashes Lymphatics: Denies new lymphadenopathy or easy bruising Neurological:Denies numbness, tingling or new weaknesses Behavioral/Psych: Mood is stable, no new  changes  All other systems were reviewed with the patient and are negative.  PHYSICAL EXAMINATION: ECOG PERFORMANCE STATUS: 1 - Symptomatic but completely ambulatory  Vitals:   07/01/16 1308  BP: (!) 145/37  Pulse: 70  Resp: 18  Temp: 97.9 F (36.6 C)   Filed Weights   07/01/16 1308  Weight: 144 lb 6.4 oz (65.5 kg)    GENERAL:alert, no distress and comfortable SKIN: skin color, texture, turgor are normal, no rashes or significant lesions EYES: normal, conjunctiva are pink and non-injected, sclera clear OROPHARYNX:no exudate, no erythema and lips, buccal mucosa, and tongue normal  NECK: supple, thyroid normal size, non-tender, without nodularity LYMPH:  no palpable lymphadenopathy in the cervical, axillary or inguinal LUNGS: clear to auscultation and percussion with normal breathing effort HEART: regular rate & rhythm and no murmurs and no lower extremity edema ABDOMEN:abdomen soft, non-tender and normal bowel sounds Musculoskeletal:no cyanosis of digits and no clubbing  PSYCH: alert & oriented x 3 with fluent speech NEURO: no focal motor/sensory deficits  LABORATORY DATA:  I have reviewed the data as listed Lab Results  Component Value Date   WBC 7.3 07/01/2016   HGB 9.4 (L) 07/01/2016   HCT 30.4 (L) 07/01/2016   MCV 87.4 07/01/2016   PLT 336 07/01/2016   Lab Results  Component Value Date   NA 143 06/10/2016   K 4.3 06/10/2016   CL 99 06/10/2016   CO2 28 06/10/2016    RADIOGRAPHIC STUDIES: I have personally reviewed the radiological reports and agreed with the findings in the report.  ASSESSMENT AND PLAN:  Iron deficiency anemia Chronic anemia since at least 20 years. Patient had blood transfusion 20 years ago and then 5 years ago and then most recently in March 2018 when she presented with hemoglobin of 6.9. She has seen Dr. Watt Climes with gastroenterology who performed upper endoscopy and did not find any cause of bleeding. She was started on fusion plus iron  replacement therapy 06/06/2016. She is tolerating it extremely well.  Plan: 1. Recheck CBC with differential and reticulocyte count as well as iron studies. Today's CBC revealed a hemoglobin of 9.4 with an MCV of 87 iron studies are pending. Reticulocyte count is high which is appropriate for the degree of anemia. It suggests a good bone marrow function.  2. we do not plan to give her any iron infusion at this time. I will see her back in 2 months with blood work. If it's remains around 9.3 or 9.4 then we can watch and monitor. If it drops below 8 then we will have to plan iron infusions.  Return to clinic in 2 months with labs done the day before was   All questions were answered. The patient knows to call the clinic with any problems, questions or concerns.    Rulon Eisenmenger, MD 07/01/16

## 2016-07-07 ENCOUNTER — Ambulatory Visit: Payer: Medicare Other | Admitting: Cardiology

## 2016-07-16 ENCOUNTER — Other Ambulatory Visit: Payer: Self-pay | Admitting: Gastroenterology

## 2016-07-16 DIAGNOSIS — D500 Iron deficiency anemia secondary to blood loss (chronic): Secondary | ICD-10-CM

## 2016-07-16 DIAGNOSIS — R634 Abnormal weight loss: Secondary | ICD-10-CM

## 2016-07-22 ENCOUNTER — Ambulatory Visit
Admission: RE | Admit: 2016-07-22 | Discharge: 2016-07-22 | Disposition: A | Discharge status: Home / Self Care | Payer: Medicare Other | Source: Ambulatory Visit | Attending: Gastroenterology | Admitting: Gastroenterology

## 2016-07-22 DIAGNOSIS — D500 Iron deficiency anemia secondary to blood loss (chronic): Secondary | ICD-10-CM

## 2016-07-22 DIAGNOSIS — R634 Abnormal weight loss: Secondary | ICD-10-CM

## 2016-07-22 MED ORDER — IOPAMIDOL (ISOVUE-300) INJECTION 61%
70.0000 mL | Freq: Once | INTRAVENOUS | Status: AC | PRN
  Administered 2016-07-22: 70 mL via INTRAVENOUS

## 2016-08-09 ENCOUNTER — Other Ambulatory Visit (HOSPITAL_BASED_OUTPATIENT_CLINIC_OR_DEPARTMENT_OTHER): Payer: Medicare Other

## 2016-08-09 DIAGNOSIS — D509 Iron deficiency anemia, unspecified: Secondary | ICD-10-CM

## 2016-08-09 LAB — CBC & DIFF AND RETIC
BASO%: 0.2 % (ref 0.0–2.0)
BASOS ABS: 0 10*3/uL (ref 0.0–0.1)
EOS ABS: 0.6 10*3/uL — AB (ref 0.0–0.5)
EOS%: 7.1 % — ABNORMAL HIGH (ref 0.0–7.0)
HCT: 31.1 % — ABNORMAL LOW (ref 34.8–46.6)
HEMOGLOBIN: 9.8 g/dL — AB (ref 11.6–15.9)
Immature Retic Fract: 7 % (ref 1.60–10.00)
LYMPH%: 19.2 % (ref 14.0–49.7)
MCH: 29.4 pg (ref 25.1–34.0)
MCHC: 31.5 g/dL (ref 31.5–36.0)
MCV: 93.4 fL (ref 79.5–101.0)
MONO#: 0.7 10*3/uL (ref 0.1–0.9)
MONO%: 9 % (ref 0.0–14.0)
NEUT#: 5.2 10*3/uL (ref 1.5–6.5)
NEUT%: 64.5 % (ref 38.4–76.8)
Platelets: 261 10*3/uL (ref 145–400)
RBC: 3.33 10*6/uL — ABNORMAL LOW (ref 3.70–5.45)
RDW: 19.1 % — ABNORMAL HIGH (ref 11.2–14.5)
Retic %: 2.16 % — ABNORMAL HIGH (ref 0.70–2.10)
Retic Ct Abs: 71.93 10*3/uL (ref 33.70–90.70)
WBC: 8.1 10*3/uL (ref 3.9–10.3)
lymph#: 1.6 10*3/uL (ref 0.9–3.3)

## 2016-08-09 LAB — LACTATE DEHYDROGENASE: LDH: 205 U/L (ref 125–245)

## 2016-08-09 LAB — IRON AND TIBC
%SAT: 40 % (ref 21–57)
Iron: 126 ug/dL (ref 41–142)
TIBC: 318 ug/dL (ref 236–444)
UIBC: 191 ug/dL (ref 120–384)

## 2016-08-09 LAB — FERRITIN: FERRITIN: 21 ng/mL (ref 9–269)

## 2016-08-10 ENCOUNTER — Ambulatory Visit (HOSPITAL_BASED_OUTPATIENT_CLINIC_OR_DEPARTMENT_OTHER): Payer: Medicare Other | Admitting: Hematology and Oncology

## 2016-08-10 ENCOUNTER — Encounter: Payer: Self-pay | Admitting: Hematology and Oncology

## 2016-08-10 DIAGNOSIS — D509 Iron deficiency anemia, unspecified: Secondary | ICD-10-CM | POA: Diagnosis not present

## 2016-08-10 LAB — HAPTOGLOBIN: Haptoglobin: 177 mg/dL (ref 34–200)

## 2016-08-10 LAB — DIRECT ANTIGLOBULIN TEST (NOT AT ARMC): Coombs', Direct: NEGATIVE

## 2016-08-10 MED ORDER — FUSION PLUS PO CAPS: 1.0000 | ORAL_CAPSULE | Freq: Every day | ORAL | Status: DC

## 2016-08-10 NOTE — Progress Notes (Signed)
Patient Care Team: Maurice Small, MD as PCP - General (Family Medicine)  DIAGNOSIS:  Encounter Diagnosis  Name Primary?  . Iron deficiency anemia, unspecified iron deficiency anemia type    CHIEF COMPLIANT: Follow-up of iron deficiency anemia  INTERVAL HISTORY: Summer Nielsen is a 83 year old with above-mentioned history of iron deficiency anemia who is taking an oral iron supplement and is here for three-month follow-up with blood work. She appears to have good energy levels and is without any complaints or concerns.  REVIEW OF SYSTEMS:   Constitutional: Denies fevers, chills or abnormal weight loss Eyes: Denies blurriness of vision Ears, nose, mouth, throat, and face: Denies mucositis or sore throat Respiratory: Denies cough, dyspnea or wheezes Cardiovascular: Denies palpitation, chest discomfort Gastrointestinal:  Denies nausea, heartburn or change in bowel habits Skin: Denies abnormal skin rashes Lymphatics: Denies new lymphadenopathy or easy bruising Neurological:Denies numbness, tingling or new weaknesses Behavioral/Psych: Mood is stable, no new changes  Extremities: No lower extremity edema Breast: . denies any pain or lumps or nodules in either breasts All other systems were reviewed with the patient and are negative.  I have reviewed the past medical history, past surgical history, social history and family history with the patient and they are unchanged from previous note.  ALLERGIES:  is allergic to levofloxacin and percocet [oxycodone-acetaminophen].  MEDICATIONS:  Current Outpatient Prescriptions  Medication Sig Dispense Refill  . amLODipine (NORVASC) 5 MG tablet Take 5 mg by mouth daily.    Marland Kitchen atorvastatin (LIPITOR) 20 MG tablet Take 20 mg by mouth daily.    . bimatoprost (LUMIGAN) 0.01 % SOLN Place 1 drop into both eyes at bedtime.     . calcium-vitamin D (OSCAL WITH D) 500-200 MG-UNIT tablet Take 1 tablet by mouth 2 (two) times daily.    . furosemide (LASIX)  40 MG tablet Take 40 mg by mouth 2 (two) times daily.    Marland Kitchen levothyroxine (SYNTHROID, LEVOTHROID) 112 MCG tablet Take 112 mcg by mouth daily before breakfast.    . losartan (COZAAR) 50 MG tablet Take 50 mg by mouth daily.    . meloxicam (MOBIC) 15 MG tablet Take 15 mg by mouth daily.    . Multiple Vitamins-Minerals (CENTRUM SILVER PO) Take 1 tablet by mouth daily.     Marland Kitchen omeprazole (PRILOSEC) 20 MG capsule Take 20 mg by mouth daily at 12 noon.     . propranolol (INDERAL) 20 MG tablet Take 20 mg by mouth 2 (two) times daily.      No current facility-administered medications for this visit.     PHYSICAL EXAMINATION: ECOG PERFORMANCE STATUS: 1 - Symptomatic but completely ambulatory  Vitals:   08/10/16 1444  BP: 139/60  Pulse: 74  Resp: 17  Temp: 98.4 F (36.9 C)   Filed Weights   08/10/16 1444  Weight: 143 lb (64.9 kg)    GENERAL:alert, no distress and comfortable SKIN: skin color, texture, turgor are normal, no rashes or significant lesions EYES: normal, Conjunctiva are pink and non-injected, sclera clear OROPHARYNX:no exudate, no erythema and lips, buccal mucosa, and tongue normal  NECK: supple, thyroid normal size, non-tender, without nodularity LYMPH:  no palpable lymphadenopathy in the cervical, axillary or inguinal LUNGS: clear to auscultation and percussion with normal breathing effort HEART: regular rate & rhythm and no murmurs and no lower extremity edema ABDOMEN:abdomen soft, non-tender and normal bowel sounds MUSCULOSKELETAL:no cyanosis of digits and no clubbing  NEURO: alert & oriented x 3 with fluent speech, no focal motor/sensory deficits EXTREMITIES: No  lower extremity edema BREAST: No palpable masses or nodules in either right or left breasts. No palpable axillary supraclavicular or infraclavicular adenopathy no breast tenderness or nipple discharge. (exam performed in the presence of a chaperone)  LABORATORY DATA:  I have reviewed the data as listed   Chemistry       Component Value Date/Time   NA 143 06/10/2016 1343   K 4.3 06/10/2016 1343   CL 99 06/10/2016 1343   CO2 28 06/10/2016 1343   BUN 38 (H) 06/10/2016 1343   CREATININE 1.29 (H) 06/10/2016 1343      Component Value Date/Time   CALCIUM 8.4 (L) 06/10/2016 1343   ALKPHOS 36 (L) 05/20/2016 0722   AST 20 05/20/2016 0722   ALT 13 (L) 05/20/2016 0722   BILITOT 0.4 05/20/2016 0722       Lab Results  Component Value Date   WBC 8.1 08/09/2016   HGB 9.8 (L) 08/09/2016   HCT 31.1 (L) 08/09/2016   MCV 93.4 08/09/2016   PLT 261 08/09/2016   NEUTROABS 5.2 08/09/2016    ASSESSMENT & PLAN:  Iron deficiency anemia Chronic anemia since at least 20 years. Patient had blood transfusion 20 years ago and then 5 years ago and then most recently in March 2018 when she presented with hemoglobin of 6.9. She has seen Dr. Watt Climes with gastroenterology who performed upper endoscopy and did not find any cause of bleeding. She was started on fusion plus iron replacement therapy 06/06/2016. She is tolerating it extremely well. ------------------------------------------------------------ Blood work review:Hemoglobin 9.8, MCV 93.4, ferritin 21, iron saturation 40% I discussed with the patient the blood work does not show any evidence of iron deficiency. Patient takes fusion iron plus supplement which appears to be helping.  We'll continue to watch and monitor her with the Follow-up with labs in 6 months  Continuing to monitor her hemoglobin. Return to clinic in 4 months for follow-up   I spent 25 minutes talking to the patient of which more than half was spent in counseling and coordination of care.  No orders of the defined types were placed in this encounter.  The patient has a good understanding of the overall plan. she agrees with it. she will call with any problems that may develop before the next visit here.   Rulon Eisenmenger, MD 08/10/16

## 2016-08-10 NOTE — Assessment & Plan Note (Signed)
Chronic anemia since at least 20 years. Patient had blood transfusion 20 years ago and then 5 years ago and then most recently in March 2018 when she presented with hemoglobin of 6.9. She has seen Dr. Watt Climes with gastroenterology who performed upper endoscopy and did not find any cause of bleeding. She was started on fusion plus iron replacement therapy 06/06/2016. She is tolerating it extremely well. ------------------------------------------------------------ Blood work review:  Continuing to monitor her hemoglobin. Return to clinic in 4 months for follow-up

## 2016-08-13 ENCOUNTER — Encounter: Payer: Self-pay | Admitting: Cardiology

## 2016-08-19 ENCOUNTER — Encounter: Payer: Self-pay | Admitting: Cardiology

## 2016-08-26 ENCOUNTER — Ambulatory Visit (INDEPENDENT_AMBULATORY_CARE_PROVIDER_SITE_OTHER): Payer: Medicare Other

## 2016-08-26 ENCOUNTER — Ambulatory Visit (INDEPENDENT_AMBULATORY_CARE_PROVIDER_SITE_OTHER): Payer: Medicare Other | Admitting: Orthopaedic Surgery

## 2016-08-26 DIAGNOSIS — M5416 Radiculopathy, lumbar region: Secondary | ICD-10-CM | POA: Diagnosis not present

## 2016-08-26 MED ORDER — PREDNISONE 10 MG (21) PO TBPK: ORAL_TABLET | ORAL | 0 refills | Status: DC

## 2016-08-26 MED ORDER — BACLOFEN 10 MG PO TABS
5.0000 mg | ORAL_TABLET | Freq: Three times a day (TID) | ORAL | 2 refills | Status: DC | PRN
Start: 2016-08-26 — End: 2017-03-07

## 2016-08-26 MED ORDER — BACLOFEN 10 MG PO TABS: 5.0000 mg | ORAL_TABLET | Freq: Three times a day (TID) | ORAL | 2 refills | Status: DC | PRN

## 2016-08-26 NOTE — Progress Notes (Signed)
Office Visit Note   Patient: Summer Nielsen           Date of Birth: 1927/08/13           MRN: [id number] Visit Date: 08/26/2016              Requested by: Maurice Small, MD [place], [place] [address] PCP: Maurice Small, MD   Assessment & Plan: Visit Diagnoses:  1. Lumbar radiculopathy     Plan: Overall impression is lumbar radiculopathy. Prednisone taper baclofen were prescribed. Follow-up as needed.  Follow-Up Instructions: Return if symptoms worsen or fail to improve.   Orders:  Orders Placed This Encounter  Procedures  . XR HIP UNILAT W OR W/O PELVIS 2-3 VIEWS RIGHT   Meds ordered this encounter  Medications  . predniSONE (STERAPRED UNI-PAK 21 TAB) 10 MG (21) TBPK tablet    Sig: Take as directed    Dispense:  21 tablet    Refill:  0  . DISCONTD: baclofen (LIORESAL) 10 MG tablet    Sig: Take 0.5 tablets (5 mg total) by mouth 3 (three) times daily as needed for muscle spasms.    Dispense:  30 each    Refill:  2  . baclofen (LIORESAL) 10 MG tablet    Sig: Take 0.5 tablets (5 mg total) by mouth 3 (three) times daily as needed for muscle spasms.    Dispense:  30 each    Refill:  2      Procedures: No procedures performed   Clinical Data: No additional findings.   Subjective: No chief complaint on file.   Patient is a 83 year old female comes in today with right hip pain radiates down into her foot. She denies any injuries or numbness tingling in her hip. She does have numbness and tingling in her foot. Pain is worse with activity.    Review of Systems  Constitutional: Negative.   HENT: Negative.   Eyes: Negative.   Respiratory: Negative.   Cardiovascular: Negative.   Endocrine: Negative.   Musculoskeletal: Negative.   Neurological: Negative.   Hematological: Negative.   Psychiatric/Behavioral: Negative.   All other systems reviewed and are negative.    Objective: Vital Signs: LMP  (LMP Unknown)   Physical Exam    Constitutional: She is oriented to person, place, and time. She appears well-developed and well-nourished.  Pulmonary/Chest: Effort normal.  Neurological: She is alert and oriented to person, place, and time.  Skin: Skin is warm. Capillary refill takes less than 2 seconds.  Psychiatric: She has a normal mood and affect. Her behavior is normal. Judgment and thought content normal.  Nursing note and vitals reviewed.   Ortho Exam Right hip exam shows painless range of motion negative Stinchfield sign. No sciatic tension signs. Subjective numbness and tingling of her right foot. Specialty Comments:  No specialty comments available.  Imaging: Xr Hip Unilat W Or W/o Pelvis 2-3 Views Right  Result Date: 08/26/2016 No significant degenerative joint disease of the right hip    PMFS History: Patient Active Problem List   Diagnosis Date Noted  . Lumbar radiculopathy 08/26/2016  . Iron deficiency anemia 05/20/2016  . Chronic right shoulder pain 02/16/2016  . Acute diastolic heart failure ([facility]) 10/30/2015  . Secondary pulmonary hypertension 10/30/2015  . Hypertensive heart disease with heart failure ([facility]) 10/30/2015  . TIA (transient ischemic attack)   . 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 Postsurgical hypothyroidism   . OP (osteoporosis)   .  Hyperlipidemia   . High blood pressure   . Glaucoma   . CHF (congestive heart failure) ([facility])    Past Medical History:  Diagnosis Date  . CHF (congestive heart failure) ([facility])   . Glaucoma   . High blood pressure   . Hyperlipidemia   . Hypothyroidism   . OP (osteoporosis)   . Postsurgical hypothyroidism   . 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 Tachycardia   . TIA (transient ischemic attack)     Family History  Problem Relation Age of Onset  . Heart attack Mother   . Cerebral aneurysm Mother   . Stroke Mother   . Hypertension Mother   . Heart attack Father   . Heart  disease Sister   . Heart disease Brother     No past surgical history on file. Social History   Occupational History  . Not on file.   Social History Main Topics  . Smoking status: Never Smoker  . Smokeless tobacco: Never Used  . Alcohol use No  . Drug use: No  . Sexual activity: Not on file

## 2016-09-03 ENCOUNTER — Ambulatory Visit: Payer: Medicare Other | Admitting: Cardiology

## 2016-09-06 ENCOUNTER — Encounter (HOSPITAL_COMMUNITY): Payer: Self-pay | Admitting: Emergency Medicine

## 2016-09-06 ENCOUNTER — Emergency Department (HOSPITAL_COMMUNITY): Payer: Medicare Other

## 2016-09-06 ENCOUNTER — Emergency Department (HOSPITAL_COMMUNITY)
Admission: EM | Admit: 2016-09-06 | Discharge: 2016-09-06 | Disposition: A | Discharge status: Home / Self Care | Payer: Medicare Other | Attending: Emergency Medicine | Admitting: Emergency Medicine

## 2016-09-06 ENCOUNTER — Other Ambulatory Visit: Payer: Self-pay

## 2016-09-06 ENCOUNTER — Other Ambulatory Visit (HOSPITAL_COMMUNITY): Payer: Self-pay

## 2016-09-06 DIAGNOSIS — I5031 Acute diastolic (congestive) heart failure: Secondary | ICD-10-CM | POA: Insufficient documentation

## 2016-09-06 DIAGNOSIS — Y9389 Activity, other specified: Secondary | ICD-10-CM | POA: Diagnosis not present

## 2016-09-06 DIAGNOSIS — S0990XA Unspecified injury of head, initial encounter: Secondary | ICD-10-CM

## 2016-09-06 DIAGNOSIS — I110 Hypertensive heart disease with heart failure: Secondary | ICD-10-CM | POA: Insufficient documentation

## 2016-09-06 DIAGNOSIS — Y92002 Bathroom of unspecified non-institutional (private) residence single-family (private) house as the place of occurrence of the external cause: Secondary | ICD-10-CM | POA: Insufficient documentation

## 2016-09-06 DIAGNOSIS — W01198A Fall on same level from slipping, tripping and stumbling with subsequent striking against other object, initial encounter: Secondary | ICD-10-CM | POA: Insufficient documentation

## 2016-09-06 DIAGNOSIS — W19XXXA Unspecified fall, initial encounter: Secondary | ICD-10-CM

## 2016-09-06 DIAGNOSIS — Z79899 Other long term (current) drug therapy: Secondary | ICD-10-CM | POA: Insufficient documentation

## 2016-09-06 DIAGNOSIS — E039 Hypothyroidism, unspecified: Secondary | ICD-10-CM | POA: Diagnosis not present

## 2016-09-06 DIAGNOSIS — Z8673 Personal history of transient ischemic attack (TIA), and cerebral infarction without residual deficits: Secondary | ICD-10-CM | POA: Diagnosis not present

## 2016-09-06 DIAGNOSIS — S62347A Nondisplaced fracture of base of fifth metacarpal bone. left hand, initial encounter for closed fracture: Secondary | ICD-10-CM

## 2016-09-06 DIAGNOSIS — Y998 Other external cause status: Secondary | ICD-10-CM | POA: Diagnosis not present

## 2016-09-06 DIAGNOSIS — Z7982 Long term (current) use of aspirin: Secondary | ICD-10-CM | POA: Insufficient documentation

## 2016-09-06 LAB — CBC WITH DIFFERENTIAL/PLATELET
BASOS PCT: 0 %
Basophils Absolute: 0 10*3/uL (ref 0.0–0.1)
Eosinophils Absolute: 0.3 10*3/uL (ref 0.0–0.7)
Eosinophils Relative: 3 %
HEMATOCRIT: 35 % — AB (ref 36.0–46.0)
HEMOGLOBIN: 11.1 g/dL — AB (ref 12.0–15.0)
LYMPHS ABS: 1.3 10*3/uL (ref 0.7–4.0)
LYMPHS PCT: 13 %
MCH: 30.4 pg (ref 26.0–34.0)
MCHC: 31.7 g/dL (ref 30.0–36.0)
MCV: 95.9 fL (ref 78.0–100.0)
MONO ABS: 1.3 10*3/uL — AB (ref 0.1–1.0)
MONOS PCT: 12 %
NEUTROS ABS: 7.8 10*3/uL — AB (ref 1.7–7.7)
NEUTROS PCT: 72 %
Platelets: 275 10*3/uL (ref 150–400)
RBC: 3.65 MIL/uL — ABNORMAL LOW (ref 3.87–5.11)
RDW: 14.8 % (ref 11.5–15.5)
WBC: 10.7 10*3/uL — ABNORMAL HIGH (ref 4.0–10.5)

## 2016-09-06 LAB — BASIC METABOLIC PANEL
Anion gap: 12 (ref 5–15)
BUN: 32 mg/dL — ABNORMAL HIGH (ref 6–20)
CHLORIDE: 100 mmol/L — AB (ref 101–111)
CO2: 28 mmol/L (ref 22–32)
CREATININE: 1.24 mg/dL — AB (ref 0.44–1.00)
Calcium: 9 mg/dL (ref 8.9–10.3)
GFR calc non Af Amer: 38 mL/min — ABNORMAL LOW (ref >60)
GFR, EST AFRICAN AMERICAN: 44 mL/min — AB (ref >60)
GLUCOSE: 100 mg/dL — AB (ref 65–99)
Potassium: 3.6 mmol/L (ref 3.5–5.1)
Sodium: 140 mmol/L (ref 135–145)

## 2016-09-06 NOTE — ED Notes (Signed)
Paged ortho

## 2016-09-06 NOTE — Progress Notes (Signed)
Orthopedic Tech Progress Note Patient Details:  Summer Nielsen February 10, 1928 [id number]  Ortho Devices Type of Ortho Device: Ace wrap, Ulna gutter splint Ortho Device/Splint Location: LUE Ortho Device/Splint Interventions: Ordered, Application   Braulio Bosch 09/06/2016, 7:56 PM

## 2016-09-06 NOTE — Discharge Instructions (Signed)
Wear splint as applied until followed up by orthopedics.  Ice for 20 minutes every 2 hours while awake for the next 2 days. Keep your hand elevated as much as possible.  Follow-up with hand surgery in the next 2-3 days. The contact information for Dr. Grandville Silos has been provided in this discharge summary free to call and make these arrangements.

## 2016-09-06 NOTE — ED Triage Notes (Signed)
Pt. Stated, I was getting off the toilet and I tripped on a rug. I hit the back of my head on the tub. Caught myself with my left hand and hurt my right next to last toe (purple and red). Pt. Couldn't remember her telephone number after she fell. Pt. Alert and orientedx 4 . No arm drift. No deficits

## 2016-09-06 NOTE — ED Provider Notes (Signed)
[facility] DEPT Provider Note   CSN: [id number] Arrival date & time: 09/06/16  1427     History   Chief Complaint Chief Complaint  Patient presents with  . Fall  . Dizziness  . Hand Pain    HPI Kolina Kube is a 83 y.o. female.  Patient is an 83 year old female with past medical history of CHF, hypertension, and prior TIA. She presents today for evaluation of a fall. She reports tripping over a bathroom rug this morning, then falling down and striking the back of her head on the bathtub. She denies any loss of consciousness or neck pain, but does state she "saw stars". She has been confused and somewhat off balance since the fall. She denies any other complaints. There are no aggravating or alleviating factors.   The history is provided by the patient.    Past Medical History:  Diagnosis Date  . CHF (congestive heart failure) ([place])   . Glaucoma   . High blood pressure   . Hyperlipidemia   . Hypothyroidism   . OP (osteoporosis)   . Postsurgical hypothyroidism   . 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 Tachycardia   . TIA (transient ischemic attack)     Patient Active Problem List   Diagnosis Date Noted  . Lumbar radiculopathy 08/26/2016  . Iron deficiency anemia 05/20/2016  . Chronic right shoulder pain 02/16/2016  . Acute diastolic heart failure ([place]) 10/30/2015  . Secondary pulmonary hypertension 10/30/2015  . Hypertensive heart disease with heart failure ([place]) 10/30/2015  . TIA (transient ischemic attack)   . 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 Postsurgical hypothyroidism   . OP (osteoporosis)   . Hyperlipidemia   . High blood pressure   . Glaucoma   . CHF (congestive heart failure) ([place])     History reviewed. No pertinent surgical history.  OB History    No data available       Home Medications    Prior to Admission medications   Medication Sig Start Date End Date Taking?  Authorizing Provider  amLODipine (NORVASC) 5 MG tablet Take 5 mg by mouth daily.    [provider]  atorvastatin (LIPITOR) 20 MG tablet Take 20 mg by mouth daily.    [provider]  baclofen (LIORESAL) 10 MG tablet Take 0.5 tablets (5 mg total) by mouth 3 (three) times daily as needed for muscle spasms. 08/26/16   Leandrew Koyanagi, MD  bimatoprost (LUMIGAN) 0.01 % SOLN Place 1 drop into both eyes at bedtime.     [provider]  calcium-vitamin D (OSCAL WITH D) 500-200 MG-UNIT tablet Take 1 tablet by mouth 2 (two) times daily.    [provider]  furosemide (LASIX) 40 MG tablet Take 40 mg by mouth 2 (two) times daily.    [provider]  Iron-FA-B Cmp-C-Biot-Probiotic (FUSION PLUS) CAPS Take 1 capsule by mouth daily. 08/10/16   Nicholas Lose, MD  levothyroxine (SYNTHROID, LEVOTHROID) 112 MCG tablet Take 112 mcg by mouth daily before breakfast.    [provider]  losartan (COZAAR) 50 MG tablet Take 50 mg by mouth daily.    [provider]  meloxicam (MOBIC) 15 MG tablet Take 15 mg by mouth daily.    [provider]  Multiple Vitamins-Minerals (CENTRUM SILVER PO) Take 1 tablet by mouth daily.     [provider]  omeprazole (PRILOSEC) 20 MG capsule Take 20 mg by mouth daily at 12  noon.     [provider]  predniSONE (STERAPRED UNI-PAK 21 TAB) 10 MG (21) TBPK tablet Take as directed 08/26/16   Leandrew Koyanagi, MD  propranolol (INDERAL) 20 MG tablet Take 20 mg by mouth 2 (two) times daily.     [provider]    Family History Family History  Problem Relation Age of Onset  . Heart attack Mother   . Cerebral aneurysm Mother   . Stroke Mother   . Hypertension Mother   . Heart attack Father   . Heart disease Sister   . Heart disease Brother     Social History Social History  Substance Use Topics  . Smoking status: Never Smoker  . Smokeless tobacco: Never Used  . Alcohol use No     Allergies     Levofloxacin and Percocet [oxycodone-acetaminophen]   Review of Systems Review of Systems  All other systems reviewed and are negative.    Physical Exam Updated Vital Signs BP 140/71   Pulse 63   Temp 98.3 F (36.8 C) (Oral)   Resp 18   Ht _0  (1.575 m)   Wt 63.5 kg (140 lb)   LMP  (LMP Unknown)   SpO2 99%   BMI 25.61 kg/m   Physical Exam  Constitutional: She is oriented to person, place, and time. She appears well-developed and well-nourished. No distress.  HENT:  Head: Normocephalic and atraumatic.  Eyes: EOM are normal. Pupils are equal, round, and reactive to light.  Neck: Normal range of motion. Neck supple.  There is no cervical spine tenderness or step-offs. She has painless range of motion in all directions.  Cardiovascular: Normal rate and regular rhythm.  Exam reveals no gallop and no friction rub.   No murmur heard. Pulmonary/Chest: Effort normal and breath sounds normal. No respiratory distress. She has no wheezes.  Abdominal: Soft. Bowel sounds are normal. She exhibits no distension. There is no tenderness.  Musculoskeletal: Normal range of motion.  Neurological: She is alert and oriented to person, place, and time. No cranial nerve deficit. She exhibits normal muscle tone. Coordination normal.  Skin: Skin is warm and dry. She is not diaphoretic.  Nursing note and vitals reviewed.    ED Treatments / Results  Labs (all labs ordered are listed, but only abnormal results are displayed) Labs Reviewed  CBC WITH DIFFERENTIAL/PLATELET - Abnormal; Notable for the following:       Result Value   WBC 10.7 (*)    RBC 3.65 (*)    Hemoglobin 11.1 (*)    HCT 35.0 (*)    Neutro Abs 7.8 (*)    Monocytes Absolute 1.3 (*)    All other components within normal limits  BASIC METABOLIC PANEL - Abnormal; Notable for the following:    Chloride 100 (*)    Glucose, Bld 100 (*)    BUN 32 (*)    Creatinine, Ser 1.24 (*)    GFR calc non Af Amer 38 (*)    GFR calc Af  Amer 44 (*)    All other components within normal limits  CBG MONITORING, ED    EKG  EKG Interpretation None       Radiology Ct Head Wo Contrast  Result Date: 09/06/2016 CLINICAL DATA:  Status post fall with trauma to the back of the head. EXAM: CT HEAD WITHOUT CONTRAST TECHNIQUE: Contiguous axial images were obtained from the base of the skull through the vertex without intravenous contrast. COMPARISON:  None. FINDINGS: Brain: No  evidence of acute infarction, hemorrhage, hydrocephalus, extra-axial collection or mass lesion/mass effect. Mild periventricular microangiopathy. Vascular: Vascular calcifications at the skullbase. Skull: Normal. Negative for fracture or focal lesion. Sinuses/Orbits: No acute finding. Other: None. IMPRESSION: No acute intracranial abnormality. Mild chronic microvascular disease. Electronically Signed   By: Fidela Salisbury M.D.   On: 09/06/2016 16:26   Dg Hand Complete Left  Result Date: 09/06/2016 CLINICAL DATA:  [place] [address] at home in bathroom this morning striking LEFT hand on floor, pain and swelling of third through fifth metacarpals EXAM: LEFT HAND - COMPLETE 3+ VIEW COMPARISON:  None FINDINGS: Diffuse osseous demineralization. Scattered narrowing of interphalangeal joints most severe at DIP joint index finger. Additional degenerative changes first CMC joint. Remaining joint spaces preserved. Comminuted mildly displaced fracture at base of fifth metacarpal. Fingers superimposed on lateral view limiting assessment. No additional fracture dislocation. IMPRESSION: Comminuted mildly displaced fracture at base of LEFT fifth metacarpal. Osseous demineralization with scattered degenerative changes as above. Electronically Signed   By: Lavonia Dana M.D.   On: 09/06/2016 16:11    Procedures Procedures (including critical care time)  Medications Ordered in ED Medications - No data to display   Initial Impression / Assessment and Plan / ED Course  I have reviewed the  triage vital signs and the nursing notes.  Pertinent labs & imaging results that were available during my care of the patient were reviewed by me and considered in my medical decision making (see chart for details).  Patient presents after a mechanical fall and striking her head. She appears to be somewhat concussed, however CT of the head is unremarkable and she is neurologically intact. Her laboratory studies showed no other reason for her to feel dizzy.  She does have a fracture to the first metacarpal. This will be treated with an ulnar gutter splint and the patient will follow-up with hand surgery this week.  Final Clinical Impressions(s) / ED Diagnoses   Final diagnoses:  None    New Prescriptions New Prescriptions   No medications on file     Veryl Speak, MD 09/06/16 1925

## 2016-09-06 NOTE — ED Notes (Signed)
Patient Alert and oriented X4. Stable and ambulatory. Patient verbalized understanding of the discharge instructions.  Patient belongings were taken by the patient.

## 2016-09-07 ENCOUNTER — Ambulatory Visit (INDEPENDENT_AMBULATORY_CARE_PROVIDER_SITE_OTHER): Payer: Medicare Other | Admitting: Cardiology

## 2016-09-07 ENCOUNTER — Encounter: Payer: Self-pay | Admitting: Cardiology

## 2016-09-07 VITALS — BP 116/52 | HR 63 | Ht 62.0 in | Wt 140.8 lb

## 2016-09-07 DIAGNOSIS — R0600 Dyspnea, unspecified: Secondary | ICD-10-CM

## 2016-09-07 DIAGNOSIS — N189 Chronic kidney disease, unspecified: Secondary | ICD-10-CM | POA: Diagnosis not present

## 2016-09-07 DIAGNOSIS — I5032 Chronic diastolic (congestive) heart failure: Secondary | ICD-10-CM | POA: Diagnosis not present

## 2016-09-07 DIAGNOSIS — D649 Anemia, unspecified: Secondary | ICD-10-CM | POA: Diagnosis not present

## 2016-09-07 DIAGNOSIS — I10 Essential (primary) hypertension: Secondary | ICD-10-CM

## 2016-09-07 NOTE — Patient Instructions (Signed)
Medication Instructions:  Your physician recommends that you continue on your current medications as directed. Please refer to the Current Medication list given to you today.   Labwork: None Ordered   Testing/Procedures: None Ordered   Follow-Up: Your physician recommends that you schedule a follow-up appointment in: 4 months with Dr. Marlou Porch  Any Other Special Instructions Will Be Listed Below (If Applicable).   Call us if your blood pressure gets too low or you feel dizzy.    If you need a refill on your cardiac medications before your next appointment, please call your pharmacy.

## 2016-09-07 NOTE — Progress Notes (Signed)
 Cardiology Office Note   Date:  09/07/2016   ID:  Summer Nielsen, DOB 08/07/1927, MRN [id number]  PCP:  Webb, Carol, MD  Cardiologist:  Dr. Skains    Chief Complaint  Patient presents with  . Shortness of Breath      History of Present Illness: Summer Nielsen is a 83 y.o. female who presents for HTN, and HF.    Pt has hx of HTN, chronic diastolic HF, with secondary pulmonary HTN, HLD, CKD (followed by Dr. Webb) and TIA.  Last seen in 01/2016 by Dr. Skains and by K. Thompson, PA twice since then.  She has episodes of dyspnea and LE edema requiring increase of lasix.  Also episode of anemia requiring hospitalization.  Transfused 2 uPRBCs.    She had echo in March 2018 with EF 55-60%, G2DD, PA pressure 35,  Nuc study  in April with low risk for ischemia.   She continued with SOB and CT angio of chest without PE, ,evidence of granulomas disease, mild cardiomegaly, atherosclerotic coronary artery disease.  She then had PFTs and pulmonary consult.  Not yet done-   Also seen for lumbar radiculopathy per Dr. Xu with ortho and prednisone taper was prescribed.  Today she does not feel it has helped much she is to call Dr. Xu back.   Was seen yesterday in ER for fall, tropped over bathroom rug.  She fell and hit back of her head on the tub.  She was off balance and confused since fall.  She has small fx at base of Lt fifth metacarpal,  CT head was without acute process.   Labs in ER with Cr 1.24, K+ 3.6, glucose 100,  hgb 11, Hct 35.  WBC 10.7.   Today she is doing well, she has her Lt wrist wrapped.  She has thrown out the rug she tripped over.  Not going to use one.   She has no SOB or chest pain.  She is much improved now her Iron is replaced she has no dyspnea.   So much better. Her HGB was 11 in ER.     She is followed by Dr. Gudena with Hematology.  She was started on fusion plus iron replacement therapy 06/06/2016  Past Medical History:  Diagnosis Date  . CHF (congestive heart  failure) ([facility])   . Glaucoma   . High blood pressure   . Hyperlipidemia   . Hypothyroidism   . OP (osteoporosis)   . Postsurgical hypothyroidism   . 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 Tachycardia   . TIA (transient ischemic attack)     No past surgical history on file.   Current Outpatient Prescriptions  Medication Sig Dispense Refill  . amLODipine (NORVASC) 5 MG tablet Take 5 mg by mouth daily.    . atorvastatin (LIPITOR) 20 MG tablet Take 20 mg by mouth daily.    . baclofen (LIORESAL) 10 MG tablet Take 0.5 tablets (5 mg total) by mouth 3 (three) times daily as needed for muscle spasms. 30 each 2  . bimatoprost (LUMIGAN) 0.01 % SOLN Place 1 drop into both eyes at bedtime.     . calcium-vitamin D (OSCAL WITH D) 500-200 MG-UNIT tablet Take 1 tablet by mouth 2 (two) times daily.    . furosemide (LASIX) 40 MG tablet Take 40 mg by mouth 2 (two) times daily.    . Iron-FA-B Cmp-C-Biot-Probiotic (FUSION PLUS) CAPS Take 1 capsule by mouth daily.    . levothyroxine (  SYNTHROID, LEVOTHROID) 112 MCG tablet Take 112 mcg by mouth daily before breakfast.    . losartan (COZAAR) 50 MG tablet Take 50 mg by mouth daily.    . meloxicam (MOBIC) 15 MG tablet Take 15 mg by mouth daily.    . Multiple Vitamins-Minerals (CENTRUM SILVER PO) Take 1 tablet by mouth daily.     Marland Kitchen omeprazole (PRILOSEC) 20 MG capsule Take 20 mg by mouth daily at 12 noon.     . propranolol (INDERAL) 20 MG tablet Take 20 mg by mouth 2 (two) times daily.      No current facility-administered medications for this visit.     Allergies:   Levofloxacin and Percocet [oxycodone-acetaminophen]    Social History:  The patient  reports that she has never smoked. She has never used smokeless tobacco. She reports that she does not drink alcohol or use drugs.   Family History:  The patient's family history includes Cerebral aneurysm in her mother; Heart attack in her father and mother; Heart disease in her  brother and sister; Hypertension in her mother; Stroke in her mother.    ROS:  General:no colds or fevers, no weight changes Skin:no rashes or ulcers HEENT:no blurred vision, no congestion CV:see HPI PUL:see HPI GI:no diarrhea constipation or melena, no indigestion GU:no hematuria, no dysuria MS:no joint pain, no claudication Neuro:no syncope, no lightheadedness Endo:no diabetes, + thyroid disease  Wt Readings from Last 3 Encounters:  09/07/16 140 lb 12.8 oz (63.9 kg)  09/06/16 140 lb (63.5 kg)  08/10/16 143 lb (64.9 kg)     PHYSICAL EXAM: VS:  BP (!) 116/52   Pulse 63   Ht 5' 2" (1.575 m)   Wt 140 lb 12.8 oz (63.9 kg)   LMP  (LMP Unknown)   BMI 25.75 kg/m  , BMI Body mass index is 25.75 kg/m. General:Pleasant affect, NAD Skin:Warm and dry, brisk capillary refill HEENT:normocephalic, sclera clear, mucus membranes moist Neck:supple, no JVD, no bruits sitting upright Heart:S1S2 RRR without murmur, gallup, rub or click Lungs:clear without rales, rhonchi, or wheezes NWG:NFAO, non tender, + BS, do not palpate liver spleen or masses Ext:no lower ext edema, 2+ pedal pulses, 2+ radial pulses Neuro:alert and oriented, MAE, follows commands, + facial symmetry    EKG:  EKG is NOT  ordered today. The ekg from ER yesterday was SR and no acute changes.    Recent Labs: 05/20/2016: ALT 13; B Natriuretic Peptide 427.1 05/27/2016: NT-Pro BNP 354 09/06/2016: BUN 32; Creatinine, Ser 1.24; Hemoglobin 11.1; Platelets 275; Potassium 3.6; Sodium 140    Lipid Panel No results found for: CHOL, TRIG, HDL, CHOLHDL, VLDL, LDLCALC, LDLDIRECT     Other studies Reviewed: Additional studies/ records that were reviewed today include:  ECHO 10/24/15: - Left ventricle: The cavity size was normal. Systolic function was normal. The estimated ejection fraction was in the range of 60% to 65%. Wall motion was normal; there were no regional wall motion abnormalities. Features are consistent with  a pseudonormal left ventricular filling pattern, with concomitant abnormal relaxation and increased filling pressure (grade 2 diastolic dysfunction). - Aortic valve: There was mild regurgitation. - Mitral valve: Calcified annulus. - Pulmonary arteries: Systolic pressure was mildly increased. PA peak pressure: 34 mm Hg (S).  Myoview 06/07/16   Nuclear stress EF: 71%.  No T wave inversion was noted during stress.  There was no ST segment deviation noted during stress.  This is a low risk study.  No reversible ischemia. LVEF 71% with normal wall motion.  This is a low risk study.      ASSESSMENT AND PLAN:  Dyspnea now improved on IRON and her Hgb is up to 11--she may not do the PFTs she will call and cancel she feels so much better Neg nuc for ischemia  Neg CT angio for PE.  Chronic diastolic HF-euvolemic today continue lasix if BP decrease let us know and we will back off on Lasix, BMET yesterday was stable  Follow up with Dr. Marlou Porch in 4 months.   Anemia improved now followed by hematology  Htn well controlled continue meds. - HR stable continue BB  CKD -3 stable lst cr 1.24 yesterday.   Fx wrist Recent mechanical fall  Iron def. anemia on iron replacement.   HLD on statin needs recheck will wait until next visit.  Current medicines are reviewed with the patient today.  The patient Has no concerns regarding medicines.  The following changes have been made:  See above Labs/ tests ordered today include:see above  Disposition:   FU:  see above  Signed, Cecilie Kicks, NP  09/07/2016 1:43 PM    [facility] [place], [address] [place], [place] Phone: [phone]; Fax: [phone]

## 2016-09-14 ENCOUNTER — Ambulatory Visit (INDEPENDENT_AMBULATORY_CARE_PROVIDER_SITE_OTHER): Payer: Medicare Other

## 2016-09-14 ENCOUNTER — Ambulatory Visit (INDEPENDENT_AMBULATORY_CARE_PROVIDER_SITE_OTHER): Payer: Medicare Other | Admitting: Orthopaedic Surgery

## 2016-09-14 DIAGNOSIS — S62317A Displaced fracture of base of fifth metacarpal bone. left hand, initial encounter for closed fracture: Secondary | ICD-10-CM | POA: Diagnosis not present

## 2016-09-14 DIAGNOSIS — M5441 Lumbago with sciatica, right side: Secondary | ICD-10-CM | POA: Diagnosis not present

## 2016-09-14 MED ORDER — TRAMADOL HCL 50 MG PO TABS: 50.0000 mg | ORAL_TABLET | Freq: Four times a day (QID) | ORAL | 0 refills | Status: DC | PRN

## 2016-09-14 NOTE — Progress Notes (Signed)
Office Visit Note   Patient: Summer Nielsen           Date of Birth: 06/18/1927           MRN: [id number] Visit Date: 09/14/2016              Requested by: Maurice Small, MD [address], [place] [address] PCP: Maurice Small, MD   Assessment & Plan: Visit Diagnoses:  1. Acute right-sided low back pain with right-sided sciatica   2. Closed displaced fracture of base of fifth metacarpal bone of left hand, initial encounter     Plan: Overall impression is left fifth metacarpal fracture and right hip contusion. Tramadol was prescribed for pain as needed. For the hand wrist brace was prescribed. Follow-up in 4 weeks left hand x-rays. With left hand x-rays.  Follow-Up Instructions: Return in about 4 weeks (around 10/12/2016).   Orders:  Orders Placed This Encounter  Procedures  . XR Lumbar Spine 2-3 Views   Meds ordered this encounter  Medications  . traMADol (ULTRAM) 50 MG tablet    Sig: Take 1 tablet (50 mg total) by mouth every 6 (six) hours as needed.    Dispense:  30 tablet    Refill:  0      Procedures: No procedures performed   Clinical Data: No additional findings.   Subjective: Chief Complaint  Patient presents with  . Lower Back - Pain  . Left Hand - Pain    Miss [name] comes in today with low back pain and a left hand fracture. She had a fall on 09/06/2016. She is been complaining of right lower back pain that radiates down into the leg. It is tender to touch. Her left hand was also fractured at the base the fifth metacarpal. She denies any radiation of pain.    Review of Systems  Constitutional: Negative.   HENT: Negative.   Eyes: Negative.   Respiratory: Negative.   Cardiovascular: Negative.   Endocrine: Negative.   Musculoskeletal: Negative.   Neurological: Negative.   Hematological: Negative.   Psychiatric/Behavioral: Negative.   All other systems reviewed and are negative.    Objective: Vital Signs: LMP  (LMP  Unknown)   Physical Exam  Constitutional: She is oriented to person, place, and time. She appears well-developed and well-nourished.  Pulmonary/Chest: Effort normal.  Neurological: She is alert and oriented to person, place, and time.  Skin: Skin is warm. Capillary refill takes less than 2 seconds.  Psychiatric: She has a normal mood and affect. Her behavior is normal. Judgment and thought content normal.  Nursing note and vitals reviewed.   Ortho Exam Left hand exam shows mild swelling and bruising. No rotational deformities of the finger.  Low back exam shows tenderness over the right sacroiliac joint. Lateral hip is nontender. Hip range of motion is painless. Specialty Comments:  No specialty comments available.  Imaging: Xr Lumbar Spine 2-3 Views  Result Date: 09/14/2016 Severe degenerative scoliosis without any acute findings.    PMFS History: Patient Active Problem List   Diagnosis Date Noted  . Lumbar radiculopathy 08/26/2016  . Iron deficiency anemia 05/20/2016  . Chronic right shoulder pain 02/16/2016  . Acute diastolic heart failure ([place]) 10/30/2015  . Secondary pulmonary hypertension 10/30/2015  . Hypertensive heart disease with heart failure ([place]) 10/30/2015  . TIA (transient ischemic attack)   . 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 Postsurgical hypothyroidism   . OP (osteoporosis)   .  Hyperlipidemia   . High blood pressure   . Glaucoma   . CHF (congestive heart failure) ([facility])    Past Medical History:  Diagnosis Date  . CHF (congestive heart failure) ([place])   . Glaucoma   . High blood pressure   . Hyperlipidemia   . Hypothyroidism   . OP (osteoporosis)   . Postsurgical hypothyroidism   . 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 Tachycardia   . TIA (transient ischemic attack)     Family History  Problem Relation Age of Onset  . Heart attack Mother   . Cerebral aneurysm Mother   .  Stroke Mother   . Hypertension Mother   . Heart attack Father   . Heart disease Sister   . Heart disease Brother     No past surgical history on file. Social History   Occupational History  . Not on file.   Social History Main Topics  . Smoking status: Never Smoker  . Smokeless tobacco: Never Used  . Alcohol use No  . Drug use: No  . Sexual activity: Not on file

## 2016-10-06 ENCOUNTER — Emergency Department (HOSPITAL_COMMUNITY)
Admission: EM | Admit: 2016-10-06 | Discharge: 2016-10-06 | Disposition: A | Discharge status: Home / Self Care | Payer: Medicare Other | Attending: Emergency Medicine | Admitting: Emergency Medicine

## 2016-10-06 ENCOUNTER — Emergency Department (HOSPITAL_COMMUNITY): Payer: Medicare Other

## 2016-10-06 DIAGNOSIS — M79604 Pain in right leg: Secondary | ICD-10-CM | POA: Diagnosis present

## 2016-10-06 DIAGNOSIS — Z79899 Other long term (current) drug therapy: Secondary | ICD-10-CM | POA: Insufficient documentation

## 2016-10-06 DIAGNOSIS — I110 Hypertensive heart disease with heart failure: Secondary | ICD-10-CM | POA: Insufficient documentation

## 2016-10-06 DIAGNOSIS — M5417 Radiculopathy, lumbosacral region: Secondary | ICD-10-CM

## 2016-10-06 DIAGNOSIS — I5031 Acute diastolic (congestive) heart failure: Secondary | ICD-10-CM | POA: Insufficient documentation

## 2016-10-06 DIAGNOSIS — E039 Hypothyroidism, unspecified: Secondary | ICD-10-CM | POA: Diagnosis not present

## 2016-10-06 DIAGNOSIS — R531 Weakness: Secondary | ICD-10-CM

## 2016-10-06 MED ORDER — NAPROXEN 375 MG PO TABS: 375.0000 mg | ORAL_TABLET | Freq: Two times a day (BID) | ORAL | 0 refills | Status: DC

## 2016-10-06 MED ORDER — ONDANSETRON 4 MG PO TBDP: ORAL_TABLET | ORAL | 0 refills | Status: DC

## 2016-10-06 MED ORDER — ONDANSETRON 4 MG PO TBDP
8.0000 mg | ORAL_TABLET | Freq: Once | ORAL | Status: AC
  Administered 2016-10-06: 8 mg via ORAL
  Filled 2016-10-06: qty 2

## 2016-10-06 MED ORDER — TRAMADOL HCL 50 MG PO TABS
50.0000 mg | ORAL_TABLET | Freq: Once | ORAL | Status: AC
  Administered 2016-10-06: 50 mg via ORAL
  Filled 2016-10-06: qty 1

## 2016-10-06 NOTE — ED Triage Notes (Signed)
Patient states she here for right leg pain for the past 2 weeks. Patient fell on July 2nd and tx here. Since then she has been having that leg pain. Patient states she has sharp, shooting pain all the way down her leg with numbness/tingling in her lower leg. Patient saw orthopedic MD and he prescribed tramadol. Patient became nauseous with that medication. Patient has no hx of sciatica or pinch nerve. Patient states she is not on gabapentin. A/ox4.

## 2016-10-06 NOTE — Discharge Instructions (Signed)
SEEK IMMEDIATE MEDICAL ATTENTION IF: New numbness, tingling, weakness, or problem with the use of your arms or legs.  Severe back pain not relieved with medications.  Change in bowel or bladder control (if you lose control of stool or urine, or if you are unable to urinate) Increasing pain in any areas of the body (such as chest or abdominal pain).  Shortness of breath, dizziness or fainting.  Nausea (feeling sick to your stomach), vomiting, fever, or sweats.

## 2016-10-06 NOTE — ED Notes (Signed)
Pt to MRI

## 2016-10-06 NOTE — ED Notes (Signed)
EDP at bedside.

## 2016-10-06 NOTE — ED Notes (Signed)
Walked patient to the bathroom patient did well 

## 2016-10-06 NOTE — ED Provider Notes (Signed)
[facility] DEPT Provider Note   CSN: [id number] Arrival date & time: 10/06/16  0706     History   Chief Complaint Chief Complaint  Patient presents with  . Leg Pain    HPI Summer Nielsen is a 83 y.o. female.  The history is provided by the patient and a relative.  Leg Pain   This is a new problem. The current episode started more than 1 week ago. The problem occurs daily. The problem has been gradually worsening. Pain location: right buttock. The quality of the pain is described as sharp. The pain is severe. Associated symptoms include numbness and tingling. She has tried rest (tramadol) for the symptoms. The treatment provided mild relief.  pt presents with right buttock pain that radiates into thigh She reports fall last month, in which she sustained a left hand fracture Soon after she noticed right buttock pain that has been worsening No new falls since that time Seen by her orthopedist and has had negative lumbar xray She has been on prednisone with no improvement She has used tramadol which does help pain but limited by nausea and she can't take other narcotics  No incontinence No focal weakness in right leg She can ambulate   Past Medical History:  Diagnosis Date  . CHF (congestive heart failure) ([place])   . Glaucoma   . High blood pressure   . Hyperlipidemia   . Hypothyroidism   . OP (osteoporosis)   . Postsurgical hypothyroidism   . 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 Tachycardia   . TIA (transient ischemic attack)     Patient Active Problem List   Diagnosis Date Noted  . Lumbar radiculopathy 08/26/2016  . Iron deficiency anemia 05/20/2016  . Chronic right shoulder pain 02/16/2016  . Acute diastolic heart failure ([place]) 10/30/2015  . Secondary pulmonary hypertension 10/30/2015  . Hypertensive heart disease with heart failure ([place]) 10/30/2015  . TIA (transient ischemic attack)   . Spondylosis of cervical region  without myelopathy or radiculopathy   . Postsurgical hypothyroidism   . OP (osteoporosis)   . Hyperlipidemia   . High blood pressure   . Glaucoma   . CHF (congestive heart failure) ([facility])     No past surgical history on file.  OB History    No data available       Home Medications    Prior to Admission medications   Medication Sig Start Date End Date Taking? Authorizing Provider  amLODipine (NORVASC) 5 MG tablet Take 5 mg by mouth daily.    [provider]  atorvastatin (LIPITOR) 20 MG tablet Take 20 mg by mouth daily.    [provider]  baclofen (LIORESAL) 10 MG tablet Take 0.5 tablets (5 mg total) by mouth 3 (three) times daily as needed for muscle spasms. 08/26/16   Leandrew Koyanagi, MD  bimatoprost (LUMIGAN) 0.01 % SOLN Place 1 drop into both eyes at bedtime.     [provider]  calcium-vitamin D (OSCAL WITH D) 500-200 MG-UNIT tablet Take 1 tablet by mouth 2 (two) times daily.    [provider]  furosemide (LASIX) 40 MG tablet Take 40 mg by mouth 2 (two) times daily.    [provider]  Iron-FA-B Cmp-C-Biot-Probiotic (FUSION PLUS) CAPS Take 1 capsule by mouth daily. 08/10/16   Nicholas Lose, MD  levothyroxine (SYNTHROID, LEVOTHROID) 112 MCG tablet Take 112 mcg by mouth daily before breakfast.    [provider]  losartan (COZAAR) 50  MG tablet Take 50 mg by mouth daily.    [provider]  meloxicam (MOBIC) 15 MG tablet Take 15 mg by mouth daily.    [provider]  Multiple Vitamins-Minerals (CENTRUM SILVER PO) Take 1 tablet by mouth daily.     [provider]  omeprazole (PRILOSEC) 20 MG capsule Take 20 mg by mouth daily at 12 noon.     [provider]  propranolol (INDERAL) 20 MG tablet Take 20 mg by mouth 2 (two) times daily.     [provider]  traMADol (ULTRAM) 50 MG tablet Take 1 tablet (50 mg total) by mouth every 6 (six) hours as needed. 09/14/16   Leandrew Koyanagi, MD     Family History Family History  Problem Relation Age of Onset  . Heart attack Mother   . Cerebral aneurysm Mother   . Stroke Mother   . Hypertension Mother   . Heart attack Father   . Heart disease Sister   . Heart disease Brother     Social History Social History  Substance Use Topics  . Smoking status: Never Smoker  . Smokeless tobacco: Never Used  . Alcohol use No     Allergies   Levofloxacin and Percocet [oxycodone-acetaminophen]   Review of Systems Review of Systems  Constitutional: Negative for fever.  Genitourinary: Negative for dysuria.  Neurological: Positive for tingling and numbness. Negative for weakness.  All other systems reviewed and are negative.    Physical Exam Updated Vital Signs BP (!) 125/94   Pulse 62   Resp 11   Ht 1.575 m (_0 )   Wt 63.5 kg (140 lb)   LMP  (LMP Unknown)   SpO2 97%   BMI 25.61 kg/m   Physical Exam CONSTITUTIONAL: Well developed/well nourished, elderly but no acute distress HEAD: Normocephalic/atraumatic EYES: EOMI  ENMT: Mucous membranes moist NECK: supple no meningeal signs SPINE/BACK:entire spine nontender, No bruising/crepitance/stepoffs noted to spine CV: S1/S2 noted, no murmurs/rubs/gallops noted LUNGS: Lungs are clear to auscultation bilaterally, no apparent distress ABDOMEN: soft, nontender  NEURO: Pt is awake/alert/appropriate, moves all extremitiesx4.  No facial droop.  She has appropriate power with righ hip flex/extension, knee flex/extension, ankle plantar/dorsiflexion.  She reports numbness throughout right posterior thigh EXTREMITIES: no tenderness with ROM of right leg/hip No erythema/edema noted to right LE SKIN: warm, color normal PSYCH: no abnormalities of mood noted, alert and oriented to situation   ED Treatments / Results  Labs (all labs ordered are listed, but only abnormal results are displayed) Labs Reviewed - No data to display  EKG  EKG Interpretation None        Radiology No results found.  Procedures Procedures   Medications Ordered in ED Medications  ondansetron (ZOFRAN-ODT) disintegrating tablet 8 mg (8 mg Oral Given 10/06/16 0817)  traMADol (ULTRAM) tablet 50 mg (50 mg Oral Given 10/06/16 0817)     Initial Impression / Assessment and Plan / ED Course  I have reviewed the triage vital signs and the nursing notes.      8:18 AM Pt with previous h/o fall She has already had negative lumbar xray She has no focal weakness in right leg No incontinence reported She has lumbosacral radiculopathy without focal weakness Will try zofran with her tramadol and reassess At this point, I don't feel emergent imaging required 8:32 AM Pt just took medicines and eating crackers  Plan at signout to dr Kathrynn Humble, ensure pain controlled, ensure patient can ambulate, discharge home with zofran and  ortho followup  Final Clinical Impressions(s) / ED Diagnoses   Final diagnoses:  Lumbosacral radiculopathy    New Prescriptions New Prescriptions   No medications on file     Ripley Fraise, MD 10/06/16 [phone]

## 2016-10-08 ENCOUNTER — Encounter: Payer: Self-pay | Admitting: Neurology

## 2016-10-12 ENCOUNTER — Encounter (INDEPENDENT_AMBULATORY_CARE_PROVIDER_SITE_OTHER): Payer: Self-pay | Admitting: Orthopaedic Surgery

## 2016-10-12 ENCOUNTER — Ambulatory Visit (INDEPENDENT_AMBULATORY_CARE_PROVIDER_SITE_OTHER): Payer: Medicare Other

## 2016-10-12 ENCOUNTER — Ambulatory Visit (INDEPENDENT_AMBULATORY_CARE_PROVIDER_SITE_OTHER): Payer: Medicare Other | Admitting: Orthopaedic Surgery

## 2016-10-12 DIAGNOSIS — M5441 Lumbago with sciatica, right side: Secondary | ICD-10-CM

## 2016-10-12 DIAGNOSIS — M79642 Pain in left hand: Secondary | ICD-10-CM

## 2016-10-12 DIAGNOSIS — S62317A Displaced fracture of base of fifth metacarpal bone. left hand, initial encounter for closed fracture: Secondary | ICD-10-CM | POA: Insufficient documentation

## 2016-10-12 MED ORDER — ONDANSETRON 4 MG PO TBDP: ORAL_TABLET | ORAL | 2 refills | Status: DC

## 2016-10-12 NOTE — Addendum Note (Signed)
Addended by: Precious Bard on: 10/12/2016 01:32 PM   Modules accepted: Orders

## 2016-10-12 NOTE — Progress Notes (Signed)
Office Visit Note   Patient: Summer Nielsen           Date of Birth: 1927-10-23           MRN: [id number] Visit Date: 10/12/2016              Requested by: Maurice Small, MD [address] [place], [place] [id number] PCP: Maurice Small, MD   Assessment & Plan: Visit Diagnoses:  1. Pain in left hand   2. Closed displaced fracture of base of fifth metacarpal bone of left hand, initial encounter   3. Acute right-sided low back pain with right-sided sciatica     Plan:  Patient's hand is doing well. May discontinue wrist brace. For her lumbar spine she has severe stenosis at L5-S1 on the right side. We'll send a request in for epidural steroid injection with Dr. Ernestina Patches. Follow-up as needed.  Follow-Up Instructions: Return if symptoms worsen or fail to improve.   Orders:  Orders Placed This Encounter  Procedures  . XR Hand Complete Left   Meds ordered this encounter  Medications  . ondansetron (ZOFRAN-ODT) 4 MG disintegrating tablet    Sig: 58m PO q 6 hrs as needed for nausea    Dispense:  30 tablet    Refill:  2      Procedures: No procedures performed   Clinical Data: No additional findings.   Subjective: Chief Complaint  Patient presents with  . Lower Back - Follow-up  . Left Hand - Pain, Follow-up    Summer Nielsen follows up today for her left hand fracture and a recent MRI of her lumbar spine. She is mainly complaining of severe right leg radiculopathy. MRI showed severe stenosis at L5-S1 on the right side. She denies any paralysis.    Review of Systems  Constitutional: Negative.   HENT: Negative.   Eyes: Negative.   Respiratory: Negative.   Cardiovascular: Negative.   Endocrine: Negative.   Musculoskeletal: Negative.   Neurological: Negative.   Hematological: Negative.   Psychiatric/Behavioral: Negative.   All other systems reviewed and are negative.    Objective: Vital Signs: LMP  (LMP Unknown)   Physical Exam  Constitutional:  She is oriented to person, place, and time. She appears well-developed and well-nourished.  Pulmonary/Chest: Effort normal.  Neurological: She is alert and oriented to person, place, and time.  Skin: Skin is warm. Capillary refill takes less than 2 seconds.  Psychiatric: She has a normal mood and affect. Her behavior is normal. Judgment and thought content normal.  Nursing note and vitals reviewed.   Ortho Exam Right lower extremity exam is positive straight leg raise test. No focal motor or sensory deficits.  Left hand exam shows mild tenderness with palpation of the base of the fifth metacarpal. Specialty Comments:  No specialty comments available.  Imaging: Xr Hand Complete Left  Result Date: 10/12/2016 Stable alignment and fracture. Evidence of bony consolidation.    PMFS History: Patient Active Problem List   Diagnosis Date Noted  . Closed displaced fracture of base of fifth metacarpal bone of left hand 10/12/2016  . Lumbar radiculopathy 08/26/2016  . Iron deficiency anemia 05/20/2016  . Chronic right shoulder pain 02/16/2016  . [place] 10/30/2015  . Secondary pulmonary hypertension 10/30/2015  . Hypertensive heart disease with heart failure ([place] 10/30/2015  . TIA (transient ischemic attack)   . 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 Postsurgical hypothyroidism   . OP (osteoporosis)   .  Hyperlipidemia   . High blood pressure   . Glaucoma   . CHF (congestive heart failure) ([facility])    Past Medical History:  Diagnosis Date  . CHF (congestive heart failure) ([place])   . Glaucoma   . High blood pressure   . Hyperlipidemia   . Hypothyroidism   . OP (osteoporosis)   . Postsurgical hypothyroidism   . 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 Tachycardia   . TIA (transient ischemic attack)     Family History  Problem Relation Age of Onset  . Heart attack Mother   . Cerebral  aneurysm Mother   . Stroke Mother   . Hypertension Mother   . Heart attack Father   . Heart disease Sister   . Heart disease Brother     No past surgical history on file. Social History   Occupational History  . Not on file.   Social History Main Topics  . Smoking status: Never Smoker  . Smokeless tobacco: Never Used  . Alcohol use No  . Drug use: No  . Sexual activity: Not on file

## 2016-10-14 ENCOUNTER — Ambulatory Visit: Payer: Medicare Other | Admitting: Neurology

## 2016-10-18 ENCOUNTER — Encounter (INDEPENDENT_AMBULATORY_CARE_PROVIDER_SITE_OTHER): Payer: Self-pay | Admitting: Physical Medicine and Rehabilitation

## 2016-10-18 ENCOUNTER — Ambulatory Visit (INDEPENDENT_AMBULATORY_CARE_PROVIDER_SITE_OTHER): Payer: Medicare Other | Admitting: Physical Medicine and Rehabilitation

## 2016-10-18 ENCOUNTER — Ambulatory Visit (INDEPENDENT_AMBULATORY_CARE_PROVIDER_SITE_OTHER): Payer: Medicare Other

## 2016-10-18 VITALS — BP 149/73 | HR 65 | Temp 98.4°F

## 2016-10-18 DIAGNOSIS — M5416 Radiculopathy, lumbar region: Secondary | ICD-10-CM

## 2016-10-18 DIAGNOSIS — M5116 Intervertebral disc disorders with radiculopathy, lumbar region: Secondary | ICD-10-CM

## 2016-10-18 MED ORDER — METHYLPREDNISOLONE ACETATE 80 MG/ML IJ SUSP
80.0000 mg | Freq: Once | INTRAMUSCULAR | Status: AC
  Administered 2016-10-18: 80 mg

## 2016-10-18 MED ORDER — LIDOCAINE HCL (PF) 1 % IJ SOLN
2.0000 mL | Freq: Once | INTRAMUSCULAR | Status: AC
  Administered 2016-10-18: 2 mL

## 2016-10-18 NOTE — Progress Notes (Unsigned)
Fluoro Time: 45 sec Mgy: 32.83

## 2016-10-18 NOTE — Patient Instructions (Signed)
[facility] Discharge Instructions  *At any time if you have questions or concerns they can be answered by calling [phone]  All Patients: . You may experience an increase in your symptoms for the first 2 days (it can take 2 days to 2 weeks for the steroid/cortisone to have its maximal effect). . You may use ice to the site for the first 24 hours; 20 minutes on and 20 minutes off and may use heat after that time. . You may resume and continue your current pain medications. If you need a refill please contact the prescribing physician. . You may resume her regular medications if any were stopped for the procedure. . You may shower but no swimming, tub bath or Jacuzzi for 24 hours. . Please remove bandage after 4 hours. . You may resume light activities as tolerated. . If you had Spine Injection, you should not drive for the next 3 hours due to anesthetics used in the procedure. Please have someone drive for you.  *If you have had sedation, Valium, Xanax, or lorazepam: Do not drive or use public transportation for 24 hours, do not operating hazardous machinery or make important personal/business decisions for 24 hours.  POSSIBLE STEROID SIDE EFFECTS: If experienced these should only last for a short period. Change in menstrual flow  Edema in (swelling)  Increased appetite Skin flushing (redness)  Skin rash/acne  Thrush (oral) Vaginitis    Increased sweating  Depression Increased blood glucose levels Cramping and leg/calf  Euphoria (feeling happy)  POSSIBLE PROCEDURE SIDE EFFECTS: Please call our office if concerned. Increased pain Increased numbness/tingling  Headache Nausea/vomiting Hematoma (bruising/bleeding) Edema (swelling at the site) Weakness  Infection (red/drainage at site) Fever greater than 100.23F  *In the event of a headache after epidural steroid injection: Drink plenty of fluids, especially water and try to lay flat when possible. If the headache does  not get better after a few days or as always if concerned please call the office.

## 2016-10-18 NOTE — Progress Notes (Deleted)
Back Pain radiating into right leg Taking Aspirin Has a Driver No allergy to x-ray dye

## 2016-10-19 NOTE — Progress Notes (Signed)
Schae Cando - 83 y.o. female MRN [id number]  Date of birth: 03-02-1928  Office Visit Note: Visit Date: 10/18/2016 PCP: Maurice Small, MD Referred by: Maurice Small, MD  Subjective: Chief Complaint  Patient presents with  . Back Pain   HPI: Mrs. Puga is a very pleasant 83 year old female that I have actually known for several months this past year because of cotreatment with her husband with Dr. Louanne Skye. She saw Dr. Sherrian Divers in our office on June 21 with complaints of right hip and leg pain consistent with a radicular L5 pain. She reports that she thinks the pain started when she had a fall where she landed on her left hand and had a fracture. That fall release was documented by the emergency department several weeks later. Nonetheless she is getting right severe radicular pain and a pretty classic L5 distribution on the right. She's never really had any pain like this before. She was treated with corticosteroids as well as muscle relaxers and medication without much relief. Dr. Erlinda Hong" getting an MRI of the lumbar spine which is reviewed below. This does show an old compression fracture at L4 but with no edema and no listhesis but this MRI shows moderately severe multifactorial stenosis at L4-5 with facet arthropathy and right foraminal stenosis at L5-S1 with disc protrusion into the foramen and likely compressing the L5 nerve root. She is felt conservative care otherwise. Her pain is quite severe. I'm going to complete a right L5 transforaminal epidural steroid injection to hopefully relieve some of her symptoms and be diagnostic. Were also going to set up physical therapy for her depending on the relief she gets with this that would include core strengthening and Noah Delaine type evaluation. Unfortunately she does have the stenosis at L4-5 as well.    ROS Otherwise per HPI.  Assessment & Plan: Visit Diagnoses:  1. Lumbar radiculopathy   2. Radiculopathy due to lumbar intervertebral disc disorder       Plan: Findings:  Diagnostic and hopefully therapeutic right L5 transforaminal steroid injection was performed. There was decent flow of contrast despite being a very stenotic area. She did get quite a bit of numbness after the injection of the Marcaine. Physical therapy orders were placed. She'll continue follow-up with either myself or Dr. Wyline Copas for her hip and leg pain. Unfortunately depending on her relief she may need to see Dr. Louanne Skye.    Meds & Orders:  Meds ordered this encounter  Medications  . lidocaine (PF) (XYLOCAINE) 1 % injection 2 mL  . methylPREDNISolone acetate (DEPO-MEDROL) injection 80 mg    Orders Placed This Encounter  Procedures  . XR C-ARM NO REPORT  . Ambulatory referral to Physical Therapy  . Epidural Steroid injection    Follow-up: Return if symptoms worsen or fail to improve.   Procedures: No procedures performed  Lumbosacral Transforaminal Epidural Steroid Injection - Infraneural Approach with Fluoroscopic Guidance  Patient: Caroline Longie      Date of Birth: 20-Jun-1927 MRN: [id number] PCP: Maurice Small, MD      Visit Date: 10/18/2016   Universal Protocol:     Consent Given By: the patient  Position: PRONE   Additional Comments: Vital signs were monitored before and after the procedure. Patient was prepped and draped in the usual sterile fashion. The correct patient, procedure, and site was verified.   Injection Procedure Details:  Procedure Site One Meds Administered:  Meds ordered this encounter  Medications  . lidocaine (PF) (XYLOCAINE) 1 % injection 2 mL  .  methylPREDNISolone acetate (DEPO-MEDROL) injection 80 mg      Laterality: Right  Location/Site:  L5-S1  Needle size: 22 G  Needle type: Spinal  Needle Placement: Transforaminal  Findings:  -Contrast Used: 1 mL iohexol 180 mg iodine/mL   -Comments: Excellent flow of contrast along the nerve and into the epidural space.  Procedure Details: After squaring off the  end-plates of the desired vertebral level to get a true AP view, the C-arm was obliqued to the painful side so that the superior articulating process is positioned about 1/3 the length of the inferior endplate.  The needle was aimed toward the junction of the superior articular process and the transverse process of the inferior vertebrae. The needle's initial entry is in the lower third of the foramen through Kambin's triangle. The soft tissues overlying this target were infiltrated with 2-3 ml. of 1% Lidocaine without Epinephrine.  The spinal needle was then inserted and advanced toward the target using a "trajectory" view along the fluoroscope beam.  Under AP and lateral visualization, the needle was advanced so it did not puncture dura and did not traverse medially beyond the 6 o'clock position of the pedicle. Bi-planar projections were used to confirm position. Aspiration was confirmed to be negative for CSF and/or blood. A 1-2 ml. volume of Isovue-250 was injected and flow of contrast was noted at each level. Radiographs were obtained for documentation purposes.   After attaining the desired flow of contrast documented above, a 0.5 to 1.0 ml test dose of 0.25% Marcaine was injected into each respective transforaminal space.  The patient was observed for 90 seconds post injection.  After no sensory deficits were reported, and normal lower extremity motor function was noted,   the above injectate was administered so that equal amounts of the injectate were placed at each foramen (level) into the transforaminal epidural space.   Additional Comments:  The patient tolerated the procedure well Dressing: Band-Aid    Post-procedure details: Patient was observed during the procedure. Post-procedure instructions were reviewed.  Patient left the clinic in stable condition.   Clinical History: MRI LUMBAR SPINE WITHOUT CONTRAST  TECHNIQUE: Multiplanar, multisequence MR imaging of the lumbar spine  was performed. No intravenous contrast was administered.  COMPARISON:  Radiographs dated 09/14/2016 and CT scan of the abdomen dated 07/22/2016  FINDINGS: Segmentation:  Standard.  Alignment: Lumbar scoliosis with convexity to the right centered at L3.  Vertebrae: Old mild compression deformity of the superior endplate of L4. No acute or subacute fractures. No bone edema.  Conus medullaris: Extends to the inferior aspect of L2 and appears normal.  Paraspinal and other soft tissues: Atrophy of the left kidney. Benign-appearing cysts on the right kidney. Common bile duct is dilated to a diameter of 10 mm in this post cholecystectomy patient, at the upper limits of normal.  Disc levels:  L1-2: Disc desiccation. Tiny broad-based disc bulge minimally asymmetric to the left without neural impingement.  L2-3: Disc desiccation. Small broad-based disc protrusion slightly asymmetric to the left with minimal compression of the thecal sac without focal neural impingement.  L3-4: Disc desiccation. Old compression fracture of the superior endplate of L4. Tiny broad-based disc bulge with no neural impingement. Slight hypertrophy of the ligamentum flavum and facet joints creates mild spinal stenosis.  L4-5: Disc desiccation. Small broad-based disc bulge. Marked hypertrophy of the facet joints and ligamentum flavum which combine to create moderately severe spinal stenosis and left lateral recess compression. No severe foraminal stenosis.  L5-S1: Disc desiccation.  Broad-based disc bulge with a disc protrusion into the right neural foramen compressing the right L5 nerve under the pedicle, best seen on image 7 of series 3. Does the patient have a right L5 radiculopathy? There is severe right facet arthritis with a joint effusion and hypertrophy of the ligamentum flavum which contributes to the foraminal stenosis. Thecal sac is only minimally compressed.  IMPRESSION: 1.  Severe right foraminal stenosis at L5-S1 with a disc protrusion in the neural foramen compressing the right L5 nerve under the pedicle. This is made were spina hypertrophy of the right facet joint and ligamentum flavum. 2. Moderately severe spinal stenosis at L4-5.   Electronically Signed   By: Lorriane Shire M.D.   On: 10/06/2016 11:10  She reports that she has never smoked. She has never used smokeless tobacco. No results for input(s): HGBA1C, LABURIC in the last 8760 hours.  Objective:  VS:  HT:    WT:   BMI:     BP:(!) 149/73  HR:65bpm  TEMP:98.4 F (36.9 C)( )  RESP:94 % Physical Exam  Musculoskeletal:  Patient ambulates without aid with a forward flexed spine. She has no pain with hip rotation she has good distal strength.    Ortho Exam Imaging: Xr C-arm No Report  Result Date: 10/18/2016 Please see Notes or Procedures tab for imaging impression.   Past Medical/Family/Surgical/Social History: Medications & Allergies reviewed per EMR Patient Active Problem List   Diagnosis Date Noted  . Closed displaced fracture of base of fifth metacarpal bone of left hand 10/12/2016  . Lumbar radiculopathy 08/26/2016  . Iron deficiency anemia 05/20/2016  . Chronic right shoulder pain 02/16/2016  . Acute diastolic heart failure ([place]) 10/30/2015  . Secondary pulmonary hypertension 10/30/2015  . Hypertensive heart disease with heart failure ([place]) 10/30/2015  . TIA (transient ischemic attack)   . 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 Postsurgical hypothyroidism   . OP (osteoporosis)   . Hyperlipidemia   . High blood pressure   . Glaucoma   . CHF (congestive heart failure) ([facility])    Past Medical History:  Diagnosis Date  . CHF (congestive heart failure) ([place])   . Glaucoma   . High blood pressure   . Hyperlipidemia   . Hypothyroidism   . OP (osteoporosis)   . Postsurgical hypothyroidism   . Spondylosis of cervical region without myelopathy  or radiculopathy   . Tachycardia   . TIA (transient ischemic attack)    Family History  Problem Relation Age of Onset  . Heart attack Mother   . Cerebral aneurysm Mother   . Stroke Mother   . Hypertension Mother   . Heart attack Father   . Heart disease Sister   . Heart disease Brother    History reviewed. No pertinent surgical history. Social History   Occupational History  . Not on file.   Social History Main Topics  . Smoking status: Never Smoker  . Smokeless tobacco: Never Used  . Alcohol use No  . Drug use: No  . Sexual activity: Not on file

## 2016-10-19 NOTE — Procedures (Signed)
Lumbosacral Transforaminal Epidural Steroid Injection - Infraneural Approach with Fluoroscopic Guidance  Patient: Summer Nielsen      Date of Birth: 05/03/1927 MRN: [id number] PCP: Maurice Small, MD      Visit Date: 10/18/2016   Universal Protocol:     Consent Given By: the patient  Position: PRONE   Additional Comments: Vital signs were monitored before and after the procedure. Patient was prepped and draped in the usual sterile fashion. The correct patient, procedure, and site was verified.   Injection Procedure Details:  Procedure Site One Meds Administered:  Meds ordered this encounter  Medications  . lidocaine (PF) (XYLOCAINE) 1 % injection 2 mL  . methylPREDNISolone acetate (DEPO-MEDROL) injection 80 mg      Laterality: Right  Location/Site:  L5-S1  Needle size: 22 G  Needle type: Spinal  Needle Placement: Transforaminal  Findings:  -Contrast Used: 1 mL iohexol 180 mg iodine/mL   -Comments: Excellent flow of contrast along the nerve and into the epidural space.  Procedure Details: After squaring off the end-plates of the desired vertebral level to get a true AP view, the C-arm was obliqued to the painful side so that the superior articulating process is positioned about 1/3 the length of the inferior endplate.  The needle was aimed toward the junction of the superior articular process and the transverse process of the inferior vertebrae. The needle's initial entry is in the lower third of the foramen through Kambin's triangle. The soft tissues overlying this target were infiltrated with 2-3 ml. of 1% Lidocaine without Epinephrine.  The spinal needle was then inserted and advanced toward the target using a "trajectory" view along the fluoroscope beam.  Under AP and lateral visualization, the needle was advanced so it did not puncture dura and did not traverse medially beyond the 6 o'clock position of the pedicle. Bi-planar projections were used to confirm position.  Aspiration was confirmed to be negative for CSF and/or blood. A 1-2 ml. volume of Isovue-250 was injected and flow of contrast was noted at each level. Radiographs were obtained for documentation purposes.   After attaining the desired flow of contrast documented above, a 0.5 to 1.0 ml test dose of 0.25% Marcaine was injected into each respective transforaminal space.  The patient was observed for 90 seconds post injection.  After no sensory deficits were reported, and normal lower extremity motor function was noted,   the above injectate was administered so that equal amounts of the injectate were placed at each foramen (level) into the transforaminal epidural space.   Additional Comments:  The patient tolerated the procedure well Dressing: Band-Aid    Post-procedure details: Patient was observed during the procedure. Post-procedure instructions were reviewed.  Patient left the clinic in stable condition.

## 2016-10-21 ENCOUNTER — Other Ambulatory Visit (INDEPENDENT_AMBULATORY_CARE_PROVIDER_SITE_OTHER): Payer: Self-pay | Admitting: Orthopaedic Surgery

## 2016-10-22 NOTE — Telephone Encounter (Signed)
Called into pharm

## 2016-10-25 ENCOUNTER — Telehealth (INDEPENDENT_AMBULATORY_CARE_PROVIDER_SITE_OTHER): Payer: Self-pay | Admitting: Physical Medicine and Rehabilitation

## 2016-10-26 NOTE — Telephone Encounter (Signed)
Repeat epidural but do different approach ie interlam, discuss different medications

## 2016-10-26 NOTE — Telephone Encounter (Signed)
Scheduled for 10/28/16 at 1030.

## 2016-10-28 ENCOUNTER — Encounter (INDEPENDENT_AMBULATORY_CARE_PROVIDER_SITE_OTHER): Payer: Self-pay | Admitting: Physical Medicine and Rehabilitation

## 2016-10-28 ENCOUNTER — Ambulatory Visit (INDEPENDENT_AMBULATORY_CARE_PROVIDER_SITE_OTHER): Payer: Medicare Other

## 2016-10-28 ENCOUNTER — Ambulatory Visit (INDEPENDENT_AMBULATORY_CARE_PROVIDER_SITE_OTHER): Payer: Medicare Other | Admitting: Physical Medicine and Rehabilitation

## 2016-10-28 VITALS — BP 137/62 | HR 57 | Temp 98.0°F

## 2016-10-28 DIAGNOSIS — M5116 Intervertebral disc disorders with radiculopathy, lumbar region: Secondary | ICD-10-CM | POA: Diagnosis not present

## 2016-10-28 DIAGNOSIS — M5416 Radiculopathy, lumbar region: Secondary | ICD-10-CM

## 2016-10-28 DIAGNOSIS — M48062 Spinal stenosis, lumbar region with neurogenic claudication: Secondary | ICD-10-CM

## 2016-10-28 MED ORDER — LIDOCAINE HCL (PF) 1 % IJ SOLN
2.0000 mL | Freq: Once | INTRAMUSCULAR | Status: AC
  Administered 2016-10-28: 2 mL

## 2016-10-28 MED ORDER — METHYLPREDNISOLONE ACETATE 80 MG/ML IJ SUSP
80.0000 mg | Freq: Once | INTRAMUSCULAR | Status: AC
  Administered 2016-10-28: 80 mg

## 2016-10-28 MED ORDER — GABAPENTIN 100 MG PO CAPS: ORAL_CAPSULE | ORAL | 1 refills | Status: DC

## 2016-10-28 NOTE — Progress Notes (Deleted)
Patient states she had relief with the last injection for around 2 days. Almost pain free for those 2 days and then pain was right back. Right side into buttock and down the leg. "Creepy crawling pain" down leg and worse from knee down.

## 2016-10-28 NOTE — Progress Notes (Signed)
Maylene Crocker - 83 y.o. female MRN [id number]  Date of birth: January 03, 1928  Office Visit Note: Visit Date: 10/28/2016 PCP: Maurice Small, MD Referred by: Maurice Small, MD  Subjective: Chief Complaint  Patient presents with  . Lower Back - Pain   HPI: Mrs. Nealis is an 83 year old female that we recently saw for an L5 transforaminal epidural steroid injection. She had about 2 days of great relief for the symptoms returned to their previous level which is quite severe. She has classic L5 distribution paresthesias and pain. MRI evidence of focal foraminal protrusion disc herniation at L5-S1 but also with moderate multifactorial stenosis at L4-5. She has a degenerative scoliosis with some entrapment on the right specifically at that lower level. Reviewing fluoroscopic images shows decently place transforaminal injection that we were having a lot of flow of contrast in of the facet joint. I think the best approach is to complete an intralaminar injection to try to get more medication along the nerve roots hopefully at L4 and L5. We also started her on gabapentin at night. When he gave her 100 mg to take 3 tablets at night and then will slowly taper. Depending on the relief she gets however she might be a good candidate for foraminotomy. She has physical therapy plan for a week or so. I think that's wise as well.    ROS Otherwise per HPI.  Assessment & Plan: Visit Diagnoses:  1. Lumbar radiculopathy   2. Radiculopathy due to lumbar intervertebral disc disorder   3. Spinal stenosis of lumbar region with neurogenic claudication     Plan: Findings:  Diagnostic and therapeutic right L5-S1 interlaminar epidural steroid injection.    Meds & Orders:  Meds ordered this encounter  Medications  . lidocaine (PF) (XYLOCAINE) 1 % injection 2 mL  . methylPREDNISolone acetate (DEPO-MEDROL) injection 80 mg  . gabapentin (NEURONTIN) 100 MG capsule    Sig: Take 3 capsules at night for 7 nights, then slowly  add capsules as directed to goal of 3 capsules 3 times per day    Dispense:  180 capsule    Refill:  1    Orders Placed This Encounter  Procedures  . XR C-ARM NO REPORT  . Epidural Steroid injection    Follow-up: Return if symptoms worsen or fail to improve, for considering repeat or Dr. Louanne Skye.   Procedures: No procedures performed  No notes on file   Clinical History: MRI LUMBAR SPINE WITHOUT CONTRAST  TECHNIQUE: Multiplanar, multisequence MR imaging of the lumbar spine was performed. No intravenous contrast was administered.  COMPARISON:  Radiographs dated 09/14/2016 and CT scan of the abdomen dated 07/22/2016  FINDINGS: Segmentation:  Standard.  Alignment: Lumbar scoliosis with convexity to the right centered at L3.  Vertebrae: Old mild compression deformity of the superior endplate of L4. No acute or subacute fractures. No bone edema.  Conus medullaris: Extends to the inferior aspect of L2 and appears normal.  Paraspinal and other soft tissues: Atrophy of the left kidney. Benign-appearing cysts on the right kidney. Common bile duct is dilated to a diameter of 10 mm in this post cholecystectomy patient, at the upper limits of normal.  Disc levels:  L1-2: Disc desiccation. Tiny broad-based disc bulge minimally asymmetric to the left without neural impingement.  L2-3: Disc desiccation. Small broad-based disc protrusion slightly asymmetric to the left with minimal compression of the thecal sac without focal neural impingement.  L3-4: Disc desiccation. Old compression fracture of the superior endplate of L4. Tiny broad-based  disc bulge with no neural impingement. Slight hypertrophy of the ligamentum flavum and facet joints creates mild spinal stenosis.  L4-5: Disc desiccation. Small broad-based disc bulge. Marked hypertrophy of the facet joints and ligamentum flavum which combine to create moderately severe spinal stenosis and left lateral  recess compression. No severe foraminal stenosis.  L5-S1: Disc desiccation. Broad-based disc bulge with a disc protrusion into the right neural foramen compressing the right L5 nerve under the pedicle, best seen on image 7 of series 3. Does the patient have a right L5 radiculopathy? There is severe right facet arthritis with a joint effusion and hypertrophy of the ligamentum flavum which contributes to the foraminal stenosis. Thecal sac is only minimally compressed.  IMPRESSION: 1. Severe right foraminal stenosis at L5-S1 with a disc protrusion in the neural foramen compressing the right L5 nerve under the pedicle. This is made were spina hypertrophy of the right facet joint and ligamentum flavum. 2. Moderately severe spinal stenosis at L4-5.   Electronically Signed   By: Lorriane Shire M.D.   On: 10/06/2016 11:10  She reports that she has never smoked. She has never used smokeless tobacco. No results for input(s): HGBA1C, LABURIC in the last 8760 hours.  Objective:  VS:  HT:    WT:   BMI:     BP:137/62  HR:(!) 57bpm  TEMP:98 F (36.7 C)(Oral)  RESP:96 % Physical Exam  Musculoskeletal:  She has good distal strength without clonus. She has a positive slump test on the right. She does have some pain over the greater trochanters. No pain with hip rotation.    Ortho Exam Imaging: No results found.  Past Medical/Family/Surgical/Social History: Medications & Allergies reviewed per EMR Patient Active Problem List   Diagnosis Date Noted  . Closed displaced fracture of base of fifth metacarpal bone of left hand 10/12/2016  . Lumbar radiculopathy 08/26/2016  . Iron deficiency anemia 05/20/2016  . Chronic right shoulder pain 02/16/2016  . Acute diastolic heart failure ([place]) 10/30/2015  . Secondary pulmonary hypertension 10/30/2015  . Hypertensive heart disease with heart failure ([place]) 10/30/2015  . TIA (transient ischemic attack)   . Spondylosis of cervical region without  myelopathy or radiculopathy   . Postsurgical hypothyroidism   . OP (osteoporosis)   . Hyperlipidemia   . High blood pressure   . Glaucoma   . CHF (congestive heart failure) ([facility])    Past Medical History:  Diagnosis Date  . CHF (congestive heart failure) ([place])   . Glaucoma   . High blood pressure   . Hyperlipidemia   . Hypothyroidism   . OP (osteoporosis)   . Postsurgical hypothyroidism   . 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 Tachycardia   . TIA (transient ischemic attack)    Family History  Problem Relation Age of Onset  . Heart attack Mother   . Cerebral aneurysm Mother   . Stroke Mother   . Hypertension Mother   . Heart attack Father   . Heart disease Sister   . Heart disease Brother    No past surgical history on file. Social History   Occupational History  . Not on file.   Social History Main Topics  . Smoking status: Never Smoker  . Smokeless tobacco: Never Used  . Alcohol use No  . Drug use: No  . Sexual activity: Not on file

## 2016-10-28 NOTE — Patient Instructions (Signed)
Gabapentin 112m take 3 capsules at night for 7 nights, then 1 cap in the morning for 3 days then increase by 1 cap slowly until 3 caps twice per day. You can then start adding capsules at lunch with goal of 3 caps 3 times per day and if we get to that point we can change to a bigger capsule  Stop Meloxicam for now and take 3 Advil up to every 4 hours, this will be short term.  Can also add Tylenol up three times per day.  Tramadol as needed   [employer] *At any time if you have questions or concerns they can be answered by calling [phone] All Patients: . You may experience an increase in your symptoms for the first 2 days (it can take 2 days to 2 weeks for the steroid/cortisone to have its maximal effect). . You may use ice to the site for the first 24 hours; 20 minutes on and 20 minutes off and may use heat after that time. . You may resume and continue your current pain medications. If you need a refill please contact the prescribing physician. . You may resume her regular medications if any were stopped for the procedure. . You may shower but no swimming, tub bath or Jacuzzi for 24 hours. . Please remove bandage after 4 hours. . You may resume light activities as tolerated. . If you had Spine Injection, you should not drive for the next 3 hours due to anesthetics used in the procedure. Please have someone drive for you.  *If you have had sedation, Valium, Xanax, or lorazepam: Do not drive or use public transportation for 24 hours, do not operating hazardous machinery or make important personal/business decisions for 24 hours.  POSSIBLE STEROID SIDE EFFECTS: If experienced these should only last for a short period. Change in menstrual flow  Edema in (swelling)  Increased appetite Skin flushing (redness)  Skin rash/acne  Thrush (oral) Vaginitis    Increased sweating  Depression Increased blood glucose levels Cramping and  leg/calf  Euphoria (feeling happy)  POSSIBLE PROCEDURE SIDE EFFECTS: Please call our office if concerned. Increased pain Increased numbness/tingling  Headache Nausea/vomiting Hematoma (bruising/bleeding) Edema (swelling at the site) Weakness  Infection (red/drainage at site) Fever greater than 100.85F  *In the event of a headache after epidural steroid injection: Drink plenty of fluids, especially water and try to lay flat when possible. If the headache does not get better after a few days or as always if concerned please call the office.

## 2016-11-01 NOTE — Procedures (Signed)
Lumbar Epidural Steroid Injection - Interlaminar Approach with Fluoroscopic Guidance  Patient: Summer Nielsen      Date of Birth: Aug 11, 1927 MRN: [id number] PCP: Maurice Small, MD      Visit Date: 10/28/2016   Universal Protocol:     Consent Given By: the patient  Position: PRONE  Additional Comments: Vital signs were monitored before and after the procedure. Patient was prepped and draped in the usual sterile fashion. The correct patient, procedure, and site was verified.   Injection Procedure Details:  Procedure Site One Meds Administered:  Meds ordered this encounter  Medications  . lidocaine (PF) (XYLOCAINE) 1 % injection 2 mL  . methylPREDNISolone acetate (DEPO-MEDROL) injection 80 mg  . gabapentin (NEURONTIN) 100 MG capsule    Sig: Take 3 capsules at night for 7 nights, then slowly add capsules as directed to goal of 3 capsules 3 times per day    Dispense:  180 capsule    Refill:  1     Laterality: Right  Location/Site:  L5-S1  Needle size: 20 G  Needle type: Tuohy  Needle Placement: Paramedian epidural  Findings:  -Contrast Used: 1 mL iohexol 180 mg iodine/mL   -Comments: Excellent flow of contrast into the epidural space.  Procedure Details: Using a paramedian approach from the side mentioned above, the region overlying the inferior lamina was localized under fluoroscopic visualization and the soft tissues overlying this structure were infiltrated with 4 ml. of 1% Lidocaine without Epinephrine. The Tuohy needle was inserted into the epidural space using a paramedian approach.   The epidural space was localized using loss of resistance along with lateral and bi-planar fluoroscopic views.  After negative aspirate for air, blood, and CSF, a 2 ml. volume of Isovue-250 was injected into the epidural space and the flow of contrast was observed. Radiographs were obtained for documentation purposes.    The injectate was administered into the level noted  above.   Additional Comments:  The patient tolerated the procedure well Dressing: Band-Aid    Post-procedure details: Patient was observed during the procedure. Post-procedure instructions were reviewed.  Patient left the clinic in stable condition.

## 2016-11-02 ENCOUNTER — Telehealth (INDEPENDENT_AMBULATORY_CARE_PROVIDER_SITE_OTHER): Payer: Self-pay | Admitting: Physical Medicine and Rehabilitation

## 2016-11-03 NOTE — Telephone Encounter (Signed)
Need appointment with Dr. Louanne Skye for eval, see more for one more injection two level trans and we can look at medications. She should start PT, I think I gave her Script, they might can try tens unit. She should continue with increasing gabapentin

## 2016-11-03 NOTE — Telephone Encounter (Signed)
Scheduled pt for 2 level TF on 11/16/16 and appt with Dr. Louanne Skye for 12/27/16.

## 2016-11-03 NOTE — Telephone Encounter (Signed)
Left message for patient

## 2016-11-05 ENCOUNTER — Ambulatory Visit: Payer: Medicare Other | Attending: Physical Medicine and Rehabilitation | Admitting: Physical Therapy

## 2016-11-05 ENCOUNTER — Encounter: Payer: Self-pay | Admitting: Physical Therapy

## 2016-11-05 DIAGNOSIS — M62830 Muscle spasm of back: Secondary | ICD-10-CM | POA: Diagnosis present

## 2016-11-05 DIAGNOSIS — M6281 Muscle weakness (generalized): Secondary | ICD-10-CM | POA: Insufficient documentation

## 2016-11-05 DIAGNOSIS — M5441 Lumbago with sciatica, right side: Secondary | ICD-10-CM | POA: Diagnosis present

## 2016-11-05 NOTE — Therapy (Signed)
[facility] [address] [place], [place], [address] Phone: [phone]   Fax:  [phone]  Physical Therapy Evaluation  Patient Details  Name: Skilynn Durney MRN: [id number] Date of Birth: 1927-10-11 Referring Provider: Dr. Magnus Sinning  Encounter Date: 11/05/2016      PT End of Session - 11/05/16 1053    Visit Number 1   Number of Visits 10   Date for PT Re-Evaluation 12/31/16   Authorization Type [employer] medicare g-code on 10th visit; kx modifier on 15th   PT Start Time 1015   PT Stop Time 1050   PT Time Calculation (min) 35 min   Activity Tolerance Patient tolerated treatment well   Behavior During Therapy [facility] for tasks assessed/performed      Past Medical History:  Diagnosis Date  . CHF (congestive heart failure) ([place])   . Glaucoma   . High blood pressure   . Hyperlipidemia   . Hypothyroidism   . OP (osteoporosis)   . Postsurgical hypothyroidism   . 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 Tachycardia   . TIA (transient ischemic attack)     History reviewed. No pertinent surgical history.  There were no vitals filed for this visit.       Subjective Assessment - 11/05/16 1020    Subjective Patient reports pain started 09/06/2016 when she fell and broke her hand.  She fell and hit the back of her head.  Patient has had 2 injections and another planned on 11/16/2016 due to the others not working.    How long can you walk comfortably? painfull   Patient Stated Goals walk without pain   Currently in Pain? Yes   Pain Score 9    Pain Location Leg   Pain Orientation Right   Pain Descriptors / Indicators Shooting;Numbness;Tingling   Pain Type Acute pain   Pain Radiating Towards buttocks to the bottom of the right foot   Pain Onset More than a month ago   Pain Frequency Intermittent   Aggravating Factors  standing   Pain Relieving Factors sitting   Multiple Pain  Sites No            [facility] PT Assessment - 11/05/16 0001      Assessment   Medical Diagnosis M54.16 Lumbar radiculopaty; M51.16 radiculopathy due to lumbar intervertbral disc disorder   Referring Provider Dr. Magnus Sinning   Onset Date/Surgical Date 09/06/16   Prior Therapy none     Precautions   Precautions Other (comment)   Precaution Comments osteoporosis; skin cancer; 3 hip left replacements     Restrictions   Weight Bearing Restrictions No     Balance Screen   Has the patient fallen in the past 6 months Yes   How many times? 1  fell in the shower   Has the patient had a decrease in activity level because of a fear of falling?  Yes  more aware of falling   Is the patient reluctant to leave their home because of a fear of falling?  No     Home Environment   Living Environment Private residence   Living Arrangements Spouse/significant other   Type of [place]     Prior Function   Level of [place] Retired     Associate Professor   Overall Cognitive Status Within Functional Limits for tasks assessed     Observation/Other Assessments   Focus on Therapeutic Outcomes (FOTO)  FOTO  score is 60% limitation  goal is 41% limitation     ROM / Strength   AROM / PROM / Strength AROM;PROM;Strength     AROM   Overall AROM Comments Lumbar extension decreased by 25%     Palpation   SI assessment  pelvis in correct alginment   Palpation comment tenderness in right hip adductors ; right quadratus is tight and painful     Special Tests    Special Tests Lumbar   Lumbar Tests Straight Leg Raise     Straight Leg Raise   Findings Negative   Side  Right   Comment hamstring pull     Ambulation/Gait   Ambulation/Gait Yes   Assistive device Straight cane            Objective measurements completed on examination: See above findings.          [facility] Adult PT Treatment/Exercise - 11/05/16 0001      Manual Therapy   Manual Therapy Soft  tissue mobilization   Soft tissue mobilization soft tissue work to right quadratus, right gluteus and  piriformis          Trigger Point Dry Needling - 11/05/16 1051    Consent Given? Yes   Education Handout Provided Yes   Muscles Treated Upper Body Quadratus Lumborum;Piriformis;Gluteus maximus  right lumbar multifidi L1-L5; right gluteus medius   Muscles Treated Lower Body Gluteus maximus;Piriformis   Gluteus Maximus Response Twitch response elicited;Palpable increased muscle length   Piriformis Response Twitch response elicited;Palpable increased muscle length              PT Education - 11/05/16 1047    Education provided Yes   Education Details information on dry needling   Person(s) Educated Patient   Methods Explanation;Handout   Comprehension Verbalized understanding          PT Short Term Goals - 11/05/16 1100      PT SHORT TERM GOAL #1   Title independent with initial HEP   Time 4   Period Weeks   Status New   Target Date 12/03/16     PT SHORT TERM GOAL #2   Title walk with pain in right leg decreased >/= 25%   Time 4   Period Weeks   Status New   Target Date 12/03/16     PT SHORT TERM GOAL #3   Title turn in bed with pain decreased >/= 25%   Time 4   Period Weeks   Status New   Target Date 12/03/16           PT Long Term Goals - 11/05/16 1031      PT LONG TERM GOAL #1   Title independent with HEP   Time 8   Period Weeks   Status New   Target Date 12/31/16     PT LONG TERM GOAL #2   Title turn in bed with pain in lumbar decreased >/= 75%    Time 8   Period Weeks   Status New   Target Date 12/31/16     PT LONG TERM GOAL #3   Title pain with walking in right leg decreased >/= 75% due to improved strength and improve muscle mobility   Time 8   Period Weeks   Target Date 12/31/16     PT LONG TERM GOAL #4   Title FOTO score </= 41% limitation   Time 8   Period Weeks   Status New   Target Date  12/31/16                 Plan - 11/05/16 1053    Clinical Impression Statement Patient is a 83 year old female with lumbar pain that radiates into the right leg after she fell in the bathroom when her foot got caught on a rug.  Patient walks with a single point cane due to 3 hip replacement on the left and shoe lift on left.  Patient reports her pain in right leg with walking is 8/10 and turning in bed back pain is 8/10.  Patient has tightness in right quadratus, right pirirformis, right gluteus.  Patient has weakness in right hip and back.  Patient has a herniated disc and has had 2 injection with no relief and next one is in 11/16/2016. Patient will benefit from skilled therapy to reduce pain with walking and turning in bed.    History and Personal Factors relevant to plan of care: 3 hip replacements on the left; osteoporosis; CHF; glaucoma   Clinical Presentation Evolving   Clinical Presentation due to: pain is getting worse and affects her walking, she has had 2 spinal injections without relief and scheduled for a third on 11/16/2016   Clinical Decision Making Moderate   Rehab Potential Excellent   Clinical Impairments Affecting Rehab Potential 3 hip replacements on the left; osteoporosis; CHF; glaucoma; skin cancer   PT Frequency 2x / week   PT Duration 8 weeks   PT Treatment/Interventions Moist Heat;Therapeutic activities;Therapeutic exercise;Neuromuscular re-education;Manual techniques;Dry needling;Patient/family education   PT Next Visit Plan see if dry needling has helped; soft tissue work to lumbar and gluteal; neural tension stretch to right LE; lateral shift exercise   PT Home Exercise Plan progress as needed   Consulted and Agree with Plan of Care Patient      Patient will benefit from skilled therapeutic intervention in order to improve the following deficits and impairments:  Pain, Decreased strength, Decreased activity tolerance, Increased fascial restricitons, Increased muscle spasms  Visit  Diagnosis: Muscle weakness (generalized) - Plan: PT plan of care cert/re-cert  Acute right-sided low back pain with right-sided sciatica - Plan: PT plan of care cert/re-cert  Muscle spasm of back - Plan: PT plan of care cert/re-cert      G-Codes - 16/10/96 1050    Functional Assessment Tool Used (Outpatient Only) FOTO score is 60% limitation  goal is 41% limitation   Functional Limitation Mobility: Walking and moving around   Mobility: Walking and Moving Around Current Status (E4540) At least 60 percent but less than 80 percent impaired, limited or restricted   Mobility: Walking and Moving Around Goal Status (J8119) At least 40 percent but less than 60 percent impaired, limited or restricted       Problem List Patient Active Problem List   Diagnosis Date Noted  . Closed displaced fracture of base of fifth metacarpal bone of left hand 10/12/2016  . Lumbar radiculopathy 08/26/2016  . Iron deficiency anemia 05/20/2016  . Chronic right shoulder pain 02/16/2016  . Acute diastolic heart failure ([place]) 10/30/2015  . Secondary pulmonary hypertension 10/30/2015  . Hypertensive heart disease with heart failure ([place]) 10/30/2015  . TIA (transient ischemic attack)   . 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 Postsurgical hypothyroidism   . OP (osteoporosis)   . Hyperlipidemia   . High blood pressure   . Glaucoma   . CHF (congestive heart failure) ([place])     Earlie Counts, PT 11/05/16 11:03  AM   [facility] [address] [place], [place], [address] Phone: [phone]   Fax:  [phone]  Name: Brailee Riede MRN: [id number] Date of Birth: [birth year]

## 2016-11-05 NOTE — Patient Instructions (Signed)
Trigger Point Dry Needling  . What is Trigger Point Dry Needling (DN)? o DN is a physical therapy technique used to treat muscle pain and dysfunction. Specifically, DN helps deactivate muscle trigger points (muscle knots).  o A thin filiform needle is used to penetrate the skin and stimulate the underlying trigger point. The goal is for a local twitch response (LTR) to occur and for the trigger point to relax. No medication of any kind is injected during the procedure.   . What Does Trigger Point Dry Needling Feel Like?  o The procedure feels different for each individual patient. Some patients report that they do not actually feel the needle enter the skin and overall the process is not painful. Very mild bleeding may occur. However, many patients feel a deep cramping in the muscle in which the needle was inserted. This is the local twitch response.   Marland Kitchen How Will I feel after the treatment? o Soreness is normal, and the onset of soreness may not occur for a few hours. Typically this soreness does not last longer than two days.  o Bruising is uncommon, however; ice can be used to decrease any possible bruising.  o In rare cases feeling tired or nauseous after the treatment is normal. In addition, your symptoms may get worse before they get better, this period will typically not last longer than 24 hours.   . What Can I do After My Treatment? o Increase your hydration by drinking more water for the next 24 hours. o You may place ice or heat on the areas treated that have become sore, however, do not use heat on inflamed or bruised areas. Heat often brings more relief post needling. o You can continue your regular activities, but vigorous activity is not recommended initially after the treatment for 24 hours. o DN is best combined with other physical therapy such as strengthening, stretching, and other therapies.    [facility] [address], [place], [place]  [address] Phone # [phone] Fax [phone]

## 2016-11-12 ENCOUNTER — Ambulatory Visit: Payer: Medicare Other | Attending: Physical Medicine and Rehabilitation | Admitting: Physical Therapy

## 2016-11-12 DIAGNOSIS — M6281 Muscle weakness (generalized): Secondary | ICD-10-CM | POA: Diagnosis not present

## 2016-11-12 DIAGNOSIS — M5441 Lumbago with sciatica, right side: Secondary | ICD-10-CM | POA: Diagnosis present

## 2016-11-12 DIAGNOSIS — M62830 Muscle spasm of back: Secondary | ICD-10-CM | POA: Diagnosis present

## 2016-11-12 NOTE — Therapy (Signed)
[facility] [address] [place], [place], [address] Phone: [phone]   Fax:  [phone]  Physical Therapy Treatment  Patient Details  Name: Summer Nielsen MRN: [id number] Date of Birth: Dec 08, 1927 Referring Provider: Dr. Magnus Sinning  Encounter Date: 11/12/2016      PT End of Session - 11/12/16 1106    Visit Number 2   Number of Visits 10   Date for PT Re-Evaluation 12/31/16   Authorization Type [employer]HC medicare g-code on 10th visit; kx modifier on 15th   PT Start Time 1020   PT Stop Time 1110   PT Time Calculation (min) 50 min   Activity Tolerance Patient limited by pain;No increased pain   Behavior During Therapy Anxious      Past Medical History:  Diagnosis Date  . CHF (congestive heart failure) ([place])   . Glaucoma   . High blood pressure   . Hyperlipidemia   . Hypothyroidism   . OP (osteoporosis)   . Postsurgical hypothyroidism   . 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 Tachycardia   . TIA (transient ischemic attack)     No past surgical history on file.  There were no vitals filed for this visit.      Subjective Assessment - 11/12/16 1024    Subjective Pt reports that things are worse today. She does have alot of pain currently in her Rt low back.    How long can you walk comfortably? painfull   Patient Stated Goals walk without pain   Currently in Pain? Yes   Pain Score 10-Worst pain ever   Pain Orientation Right   Pain Descriptors / Indicators Sharp   Pain Type Acute pain   Pain Radiating Towards Rt low back/pelvic region   Pain Onset More than a month ago   Pain Frequency Constant   Aggravating Factors  everything   Pain Relieving Factors unable to find comfortable position                [facility] Adult PT Treatment/Exercise - 11/12/16 0001      Exercises   Exercises Lumbar     Lumbar Exercises: Seated   Other Seated Lumbar Exercises  trunk rotation x10 reps Lt/Rt      Lumbar Exercises: Supine   Heel Slides 10 reps   Heel Slides Limitations wedge behind back    Other Supine Lumbar Exercises supine bent knee fall out x10 reps each, wedge behind back    Other Supine Lumbar Exercises low trunk rotation x10 reps each direction      Modalities   Modalities Moist Heat     Moist Heat Therapy   Number Minutes Moist Heat 10 Minutes  end of session    Moist Heat Location --  Rt hip/low back      Manual Therapy   Soft tissue mobilization STM Rt quadratus, Rt gluteals region                 PT Education - 11/12/16 1104    Education provided Yes   Education Details discussed nature of muscle spasm in the low back/hip region and benefits of gentle movements; encouraged pt to use modalities as needed for comfort; implemented and reviewed HEP   Person(s) Educated Patient   Methods Explanation;Verbal cues;Handout   Comprehension Returned demonstration;Verbalized understanding          PT Short Term Goals - 11/05/16 1100      PT SHORT TERM  GOAL #1   Title independent with initial HEP   Time 4   Period Weeks   Status New   Target Date 12/03/16     PT SHORT TERM GOAL #2   Title walk with pain in right leg decreased >/= 25%   Time 4   Period Weeks   Status New   Target Date 12/03/16     PT SHORT TERM GOAL #3   Title turn in bed with pain decreased >/= 25%   Time 4   Period Weeks   Status New   Target Date 12/03/16           PT Long Term Goals - 11/05/16 1031      PT LONG TERM GOAL #1   Title independent with HEP   Time 8   Period Weeks   Status New   Target Date 12/31/16     PT LONG TERM GOAL #2   Title turn in bed with pain in lumbar decreased >/= 75%    Time 8   Period Weeks   Status New   Target Date 12/31/16     PT LONG TERM GOAL #3   Title pain with walking in right leg decreased >/= 75% due to improved strength and improve muscle mobility   Time 8   Period Weeks   Target  Date 12/31/16     PT LONG TERM GOAL #4   Title FOTO score </= 41% limitation   Time 8   Period Weeks   Status New   Target Date 12/31/16               Plan - 11/12/16 1107    Clinical Impression Statement Pt arrived today with increased anxiety and severe reports of Rt low back/hip pain onset since her last session, although her radicular symptoms are now reportedly resolved. Therapist spent the beginning of today's session educating pt on the nature of her symptoms and introducing gentle lumbar/hip mobility exercises for home. Pt was able to complete these without report of Rt hip pain. Ended with soft tissue massage along the Rt quadratus region and Rt glute max with palpable muscle spasm and knotting which was significantly improved following this treatment. Ended with pt still demonstrating guarded movements, however reporting feeling better than when she arrived and standing more upright.   Rehab Potential Excellent   Clinical Impairments Affecting Rehab Potential 3 hip replacements on the left; osteoporosis; CHF; glaucoma; skin cancer   PT Frequency 2x / week   PT Duration 8 weeks   PT Treatment/Interventions Moist Heat;Therapeutic activities;Therapeutic exercise;Neuromuscular re-education;Manual techniques;Dry needling;Patient/family education   PT Next Visit Plan promote gentle lumbar mobility; soft tissue work to lumbar and gluteal; modalities to decrease pain   PT Home Exercise Plan supine low trunk rotation, supine heel slide, supine bent knee fallout   Consulted and Agree with Plan of Care Patient      Patient will benefit from skilled therapeutic intervention in order to improve the following deficits and impairments:  Pain, Decreased strength, Decreased activity tolerance, Increased fascial restricitons, Increased muscle spasms  Visit Diagnosis: Muscle weakness (generalized)  Acute right-sided low back pain with right-sided sciatica  Muscle spasm of  back     Problem List Patient Active Problem List   Diagnosis Date Noted  . Closed displaced fracture of base of fifth metacarpal bone of left hand 10/12/2016  . Lumbar radiculopathy 08/26/2016  . Iron deficiency anemia 05/20/2016  . Chronic right shoulder pain 02/16/2016  .  Acute diastolic heart failure ([place]) 10/30/2015  . Secondary pulmonary hypertension 10/30/2015  . Hypertensive heart disease with heart failure ([place]) 10/30/2015  . TIA (transient ischemic attack)   . 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 Postsurgical hypothyroidism   . OP (osteoporosis)   . Hyperlipidemia   . High blood pressure   . Glaucoma   . CHF (congestive heart failure) ([place])     11:24 AM,11/12/16 Elly Modena PT, DPT [facility] [address] [place], [place], [address] Phone: [phone]   Fax:  [phone]  Name: Summer Nielsen MRN: [id number] Date of Birth: [birth year]

## 2016-11-16 ENCOUNTER — Ambulatory Visit (INDEPENDENT_AMBULATORY_CARE_PROVIDER_SITE_OTHER): Payer: Medicare Other

## 2016-11-16 ENCOUNTER — Encounter (INDEPENDENT_AMBULATORY_CARE_PROVIDER_SITE_OTHER): Payer: Self-pay | Admitting: Physical Medicine and Rehabilitation

## 2016-11-16 ENCOUNTER — Ambulatory Visit (INDEPENDENT_AMBULATORY_CARE_PROVIDER_SITE_OTHER): Payer: Medicare Other | Admitting: Physical Medicine and Rehabilitation

## 2016-11-16 VITALS — BP 142/59 | HR 60

## 2016-11-16 DIAGNOSIS — M5416 Radiculopathy, lumbar region: Secondary | ICD-10-CM | POA: Diagnosis not present

## 2016-11-16 DIAGNOSIS — M5116 Intervertebral disc disorders with radiculopathy, lumbar region: Secondary | ICD-10-CM

## 2016-11-16 MED ORDER — LIDOCAINE HCL (PF) 1 % IJ SOLN
2.0000 mL | Freq: Once | INTRAMUSCULAR | Status: AC
  Administered 2016-11-16: 2 mL

## 2016-11-16 MED ORDER — METHYLPREDNISOLONE ACETATE 80 MG/ML IJ SUSP
80.0000 mg | Freq: Once | INTRAMUSCULAR | Status: AC
Start: 2016-11-16 — End: 2016-11-16
  Administered 2016-11-16: 80 mg

## 2016-11-16 NOTE — Patient Instructions (Signed)
[facility] Discharge Instructions  *At any time if you have questions or concerns they can be answered by calling [phone]  All Patients: . You may experience an increase in your symptoms for the first 2 days (it can take 2 days to 2 weeks for the steroid/cortisone to have its maximal effect). . You may use ice to the site for the first 24 hours; 20 minutes on and 20 minutes off and may use heat after that time. . You may resume and continue your current pain medications. If you need a refill please contact the prescribing physician. . You may resume her regular medications if any were stopped for the procedure. . You may shower but no swimming, tub bath or Jacuzzi for 24 hours. . Please remove bandage after 4 hours. . You may resume light activities as tolerated. . If you had Spine Injection, you should not drive for the next 3 hours due to anesthetics used in the procedure. Please have someone drive for you.  *If you have had sedation, Valium, Xanax, or lorazepam: Do not drive or use public transportation for 24 hours, do not operating hazardous machinery or make important personal/business decisions for 24 hours.  POSSIBLE STEROID SIDE EFFECTS: If experienced these should only last for a short period. Change in menstrual flow  Edema in (swelling)  Increased appetite Skin flushing (redness)  Skin rash/acne  Thrush (oral) Vaginitis    Increased sweating  Depression Increased blood glucose levels Cramping and leg/calf  Euphoria (feeling happy)  POSSIBLE PROCEDURE SIDE EFFECTS: Please call our office if concerned. Increased pain Increased numbness/tingling  Headache Nausea/vomiting Hematoma (bruising/bleeding) Edema (swelling at the site) Weakness  Infection (red/drainage at site) Fever greater than 100.18F  *In the event of a headache after epidural steroid injection: Drink plenty of fluids, especially water and try to lay flat when possible. If the headache does  not get better after a few days or as always if concerned please call the office.

## 2016-11-16 NOTE — Progress Notes (Deleted)
Patient states she had no relief with last injection. Pain on the right side and into hip. Sharp pains with walking.No groin or leg pain. Denies numbness, tingling, or weakness. Has went for 2 PT visits so far. 3 x 100 mg bid for now no leg pain

## 2016-11-17 NOTE — Procedures (Signed)
Mrs. Credit for a pleasant 83 year old female with recent severe right radicular leg pain and a pretty classic L5 distribution. L5 transforaminal injection was diagnostic and a sense that she has several days of relief but the symptoms returned pretty dramatically. We did completed intralaminar epidural injection with no relief at all. We started her on gabapentin and she is taking 300 mg twice a day now and she reports some relief of the leg pain going down to the foot. She is also started physical therapy for 2 sessions. She still having severe back and buttock and hip pain. I still think this is consistent with the disc herniation at L5-S1 and the foraminal narrowing and arthritic changes there. She does have some moderate stenosis centrally but I don't think this really the source of pain. We are going to complete a second L5 transforaminal injection today diagnostically and hopefully therapeutically. She is going to start increasing the gabapentin for a third dose at midday. She may continue with physical therapy. She does have an appointment with Dr. Louanne Skye for sometime in October and I'll ask her to keep that appointment.  Lumbosacral Transforaminal Epidural Steroid Injection - Sub-Pedicular Approach with Fluoroscopic Guidance  Patient: Summer Nielsen      Date of Birth: 07-Apr-1927 MRN: [id number] PCP: Maurice Small, MD      Visit Date: 11/16/2016   Universal Protocol:    Date/Time: 11/16/2016  Consent Given By: the patient  Position: PRONE  Additional Comments: Vital signs were monitored before and after the procedure. Patient was prepped and draped in the usual sterile fashion. The correct patient, procedure, and site was verified.   Injection Procedure Details:  Procedure Site One Meds Administered:  Meds ordered this encounter  Medications  . lidocaine (PF) (XYLOCAINE) 1 % injection 2 mL  . methylPREDNISolone acetate (DEPO-MEDROL) injection 80 mg    Laterality:  Right  Location/Site:  L5-S1  Needle size: 22 G  Needle type: Spinal  Needle Placement: Transforaminal  Findings:  -Contrast Used: 1 mL iohexol 180 mg iodine/mL   -Comments: Excellent flow of contrast along the nerve and into the epidural space.  Procedure Details: After squaring off the end-plates to get a true AP view, the C-arm was positioned so that an oblique view of the foramen as noted above was visualized. The target area is just inferior to the "nose of the scotty dog" or sub pedicular. The soft tissues overlying this structure were infiltrated with 2-3 ml. of 1% Lidocaine without Epinephrine.  The spinal needle was inserted toward the target using a "trajectory" view along the fluoroscope beam.  Under AP and lateral visualization, the needle was advanced so it did not puncture dura and was located close the 6 O'Clock position of the pedical in AP tracterory. Biplanar projections were used to confirm position. Aspiration was confirmed to be negative for CSF and/or blood. A 1-2 ml. volume of Isovue-250 was injected and flow of contrast was noted at each level. Radiographs were obtained for documentation purposes.   After attaining the desired flow of contrast documented above, a 0.5 to 1.0 ml test dose of 0.25% Marcaine was injected into each respective transforaminal space.  The patient was observed for 90 seconds post injection.  After no sensory deficits were reported, and normal lower extremity motor function was noted,   the above injectate was administered so that equal amounts of the injectate were placed at each foramen (level) into the transforaminal epidural space.   Additional Comments:  The patient tolerated  the procedure well Dressing: Band-Aid    Post-procedure details: Patient was observed during the procedure. Post-procedure instructions were reviewed.  Patient left the clinic in stable condition.

## 2016-11-18 ENCOUNTER — Ambulatory Visit: Payer: Medicare Other | Admitting: Physical Therapy

## 2016-11-18 ENCOUNTER — Encounter: Payer: Self-pay | Admitting: Physical Therapy

## 2016-11-18 DIAGNOSIS — M5441 Lumbago with sciatica, right side: Secondary | ICD-10-CM

## 2016-11-18 DIAGNOSIS — M6281 Muscle weakness (generalized): Secondary | ICD-10-CM

## 2016-11-18 DIAGNOSIS — M62830 Muscle spasm of back: Secondary | ICD-10-CM

## 2016-11-18 NOTE — Patient Instructions (Signed)
Posture Tips DO: - stand tall and erect - keep chin tucked in - keep head and shoulders in alignment - check posture regularly in mirror or large window - pull head back against headrest in car seat;  Change your position often.  Sit with lumbar support. DON'T: - slouch or slump while watching TV or reading - sit, stand or lie in one position  for too long;  Sitting is especially hard on the spine so if you sit at a desk/use the computer, then stand up often!   Copyright  VHI. All rights reserved.  Posture - Standing   Good posture is important. Avoid slouching and forward head thrust. Maintain curve in low back and align ears over shoul- ders, hips over ankles.  Pull your belly button in toward your back bone.   Copyright  VHI. All rights reserved.  Posture - Sitting   Sit upright, head facing forward. Try using a roll to support lower back. Keep shoulders relaxed, and avoid rounded back. Keep hips level with knees. Avoid crossing legs for long periods.   Copyright  VHI. All rights reserved.

## 2016-11-18 NOTE — Therapy (Signed)
[facility] [address], [place], [place], [address] Phone: [phone]   Fax:  [phone]  Physical Therapy Treatment  Patient Details  Name: Summer Nielsen MRN: [id number] Date of Birth: [birth year] Referring Provider: Dr. Magnus Sinning  Encounter Date: 11/18/2016      PT End of Session - 11/18/16 1459    Visit Number 3   Number of Visits 10   Date for PT Re-Evaluation 12/31/16   Authorization Type UHC medicare g-code on 10th visit; kx modifier on 15th   PT Start Time 1447   PT Stop Time 1526   PT Time Calculation (min) 39 min   Activity Tolerance Patient tolerated treatment well      Past Medical History:  Diagnosis Date  . CHF (congestive heart failure) ([place])   . Glaucoma   . High blood pressure   . Hyperlipidemia   . Hypothyroidism   . OP (osteoporosis)   . Postsurgical hypothyroidism   . 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 Tachycardia   . TIA (transient ischemic attack)     History reviewed. No pertinent surgical history.  There were no vitals filed for this visit.      Subjective Assessment - 11/18/16 1452    Subjective Patient states she is feeling much better since the previous injection   Currently in Pain? Yes   Pain Score 4    Pain Location Back   Pain Orientation Right;Lower   Pain Descriptors / Indicators Aching   Pain Radiating Towards right low back   Pain Onset More than a month ago   Pain Frequency Intermittent   Aggravating Factors  sitting a long time, bending over, leaning forward when eating   Multiple Pain Sites No                         [facility] Adult PT Treatment/Exercise - 11/18/16 0001      Self-Care   Self-Care Posture   Posture educated on posture and using towel roll behind back when sitting     Lumbar Exercises: Stretches   Single Knee to Chest Stretch 3 reps;20 seconds   Piriformis Stretch 3 reps;20  seconds  right side only     Lumbar Exercises: Aerobic   Stationary Bike nustep L1 x 7 min  began feeling in low back     Lumbar Exercises: Standing   Heel Raises 20 reps  cues for posture     Lumbar Exercises: Seated   Other Seated Lumbar Exercises ball squeeze with abdominal contraction - towel behind back     Lumbar Exercises: Supine   Ab Set 10 reps   Clam 20 reps  red band   Clam Limitations limted with left leg abduction weakness   Other Supine Lumbar Exercises supine bent knee fall out x10 reps each, wedge behind back    Other Supine Lumbar Exercises low trunk rotation x10 reps each direction                 PT Education - 11/18/16 1613    Education provided Yes   Education Details posture   Person(s) Educated Patient   Methods Explanation;Handout;Verbal cues   Comprehension Verbalized understanding          PT Short Term Goals - 11/18/16 1615      PT SHORT TERM GOAL #1   Title independent with initial HEP   Time 4   Period Weeks  Status On-going     PT SHORT TERM GOAL #2   Title walk with pain in right leg decreased >/= 25%   Time 4   Period Weeks   Status On-going     PT SHORT TERM GOAL #3   Title turn in bed with pain decreased >/= 25%   Time 4   Period Weeks   Status On-going           PT Long Term Goals - 11/05/16 1031      PT LONG TERM GOAL #1   Title independent with HEP   Time 8   Period Weeks   Status New   Target Date 12/31/16     PT LONG TERM GOAL #2   Title turn in bed with pain in lumbar decreased >/= 75%    Time 8   Period Weeks   Status New   Target Date 12/31/16     PT LONG TERM GOAL #3   Title pain with walking in right leg decreased >/= 75% due to improved strength and improve muscle mobility   Time 8   Period Weeks   Target Date 12/31/16     PT LONG TERM GOAL #4   Title FOTO score </= 41% limitation   Time 8   Period Weeks   Status New   Target Date 12/31/16               Plan -  11/18/16 1500    Clinical Impression Statement Patient feeling much better today.  She was able to perform exercises without increased pain.  Patient has significant weakness on left LE.  She was able to stabilize pelvis a little more with cues.  She will benefit from skilled PT for strengthening of core and LE   Clinical Impairments Affecting Rehab Potential 3 hip replacements on the left; osteoporosis; CHF; glaucoma; skin cancer   PT Treatment/Interventions Moist Heat;Therapeutic activities;Therapeutic exercise;Neuromuscular re-education;Manual techniques;Dry needling;Patient/family education   PT Next Visit Plan promote gentle lumbar mobility and strength; soft tissue work to lumbar and gluteal; modalities to decrease painsa needed   Consulted and Agree with Plan of Care Patient      Patient will benefit from skilled therapeutic intervention in order to improve the following deficits and impairments:  Pain, Decreased strength, Decreased activity tolerance, Increased fascial restricitons, Increased muscle spasms  Visit Diagnosis: Muscle weakness (generalized)  Acute right-sided low back pain with right-sided sciatica  Muscle spasm of back     Problem List Patient Active Problem List   Diagnosis Date Noted  . Closed displaced fracture of base of fifth metacarpal bone of left hand 10/12/2016  . Lumbar radiculopathy 08/26/2016  . Iron deficiency anemia 05/20/2016  . Chronic right shoulder pain 02/16/2016  . Acute diastolic heart failure ([place]) 10/30/2015  . Secondary pulmonary hypertension 10/30/2015  . Hypertensive heart disease with heart failure ([place]) 10/30/2015  . TIA (transient ischemic attack)   . 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 Postsurgical hypothyroidism   . OP (osteoporosis)   . Hyperlipidemia   . High blood pressure   . Glaucoma   . CHF (congestive heart failure) ([place])     Zannie Cove, PT 11/18/2016, 4:16 PM  [facility] [address], [place], [place], [address] Phone: [phone]   Fax:  [phone]  Name: Trang Bouse MRN: [id number] Date of Birth: 08-Apr-1927

## 2016-11-22 ENCOUNTER — Ambulatory Visit: Payer: Medicare Other | Admitting: Physical Therapy

## 2016-11-22 DIAGNOSIS — M5441 Lumbago with sciatica, right side: Secondary | ICD-10-CM

## 2016-11-22 DIAGNOSIS — M6281 Muscle weakness (generalized): Secondary | ICD-10-CM

## 2016-11-22 DIAGNOSIS — M62830 Muscle spasm of back: Secondary | ICD-10-CM

## 2016-11-22 NOTE — Therapy (Signed)
[facility] [address], [place], [place], [address] Phone: [phone]   Fax:  [phone]  Physical Therapy Treatment  Patient Details  Name: Summer Nielsen MRN: [id number] Date of Birth: 02-02-1928 Referring Provider: Dr. Magnus Sinning  Encounter Date: 11/22/2016      PT End of Session - 11/22/16 1326    Visit Number 4   Number of Visits 10   Date for PT Re-Evaluation 12/31/16   Authorization Type UHC medicare g-code on 10th visit; kx modifier on 15th   PT Start Time 1232   PT Stop Time 1311   PT Time Calculation (min) 39 min   Activity Tolerance Patient tolerated treatment well;No increased pain   Behavior During Therapy WFL for tasks assessed/performed      Past Medical History:  Diagnosis Date  . CHF (congestive heart failure) ([place])   . Glaucoma   . High blood pressure   . Hyperlipidemia   . Hypothyroidism   . OP (osteoporosis)   . Postsurgical hypothyroidism   . 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 Tachycardia   . TIA (transient ischemic attack)     No past surgical history on file.  There were no vitals filed for this visit.      Subjective Assessment - 11/22/16 1236    Subjective Pt states that things are improved overall. She has been working on her exercises at home and reports no issues with these.    Currently in Pain? Yes   Pain Score 3    Pain Location Back   Pain Orientation Right   Pain Descriptors / Indicators Aching   Pain Radiating Towards none    Pain Onset More than a month ago   Pain Frequency Intermittent   Aggravating Factors  standing for long periods of time and walking    Pain Relieving Factors resting and sitting with pillow behind her back    Multiple Pain Sites No              [facility] Adult PT Treatment/Exercise - 11/22/16 0001      Lumbar Exercises: Seated   Other Seated Lumbar Exercises low rows and high rows with  red TB 2x10 reps, towel roll for lumbar support; seated trunk rotation stretch 10x5 sec each side     Lumbar Exercises: Supine   Clam 10 reps   Clam Limitations red TB with RLE, yellow TB with LLE    Isometric Hip Flexion 10 reps   Other Supine Lumbar Exercises ball squeeze with BUE pressdown x15 reps; Double knee to chest with heels on red physioball x10 reps, therapist assistance with LLE.    Other Supine Lumbar Exercises low trunk rotation with focus on exhale/inhale x15 reps each direction                 PT Education - 11/22/16 1324    Education provided Yes   Education Details technique with therex; addition to HEP    Person(s) Educated Patient   Methods Handout;Explanation;Verbal cues   Comprehension Returned demonstration;Verbalized understanding          PT Short Term Goals - 11/18/16 1615      PT SHORT TERM GOAL #1   Title independent with initial HEP   Time 4   Period Weeks   Status On-going     PT SHORT TERM GOAL #2   Title walk with pain in right leg decreased >/= 25%   Time 4  Period Weeks   Status On-going     PT SHORT TERM GOAL #3   Title turn in bed with pain decreased >/= 25%   Time 4   Period Weeks   Status On-going           PT Long Term Goals - 11/05/16 1031      PT LONG TERM GOAL #1   Title independent with HEP   Time 8   Period Weeks   Status New   Target Date 12/31/16     PT LONG TERM GOAL #2   Title turn in bed with pain in lumbar decreased >/= 75%    Time 8   Period Weeks   Status New   Target Date 12/31/16     PT LONG TERM GOAL #3   Title pain with walking in right leg decreased >/= 75% due to improved strength and improve muscle mobility   Time 8   Period Weeks   Target Date 12/31/16     PT LONG TERM GOAL #4   Title FOTO score </= 41% limitation   Time 8   Period Weeks   Status New   Target Date 12/31/16               Plan - 11/22/16 1302    Clinical Impression Statement Pt continues to report  improving Rt low back/hip pain with activity. Session focused on exercises to improve core strength and postural control, with pt requiring assistance with the LLE during several exercises. Overall, pt reported no increase in her pain by the end of today's session and demonstrated good understanding of HEP additions. Will continue with current POC.   Clinical Impairments Affecting Rehab Potential 3 hip replacements on the left; osteoporosis; CHF; glaucoma; skin cancer   PT Treatment/Interventions Moist Heat;Therapeutic activities;Therapeutic exercise;Neuromuscular re-education;Manual techniques;Dry needling;Patient/family education   PT Next Visit Plan continue to promote gentle lumbar mobility and strength; soft tissue work to lumbar and gluteal; modalities to decrease pain as needed   PT Home Exercise Plan supine low trunk rotation, supine heel slide, supine bent knee fallout, supine clams (yellow on Lt, red on Rt)   Consulted and Agree with Plan of Care Patient      Patient will benefit from skilled therapeutic intervention in order to improve the following deficits and impairments:  Pain, Decreased strength, Decreased activity tolerance, Increased fascial restricitons, Increased muscle spasms  Visit Diagnosis: Muscle weakness (generalized)  Acute right-sided low back pain with right-sided sciatica  Muscle spasm of back     Problem List Patient Active Problem List   Diagnosis Date Noted  . Closed displaced fracture of base of fifth metacarpal bone of left hand 10/12/2016  . Lumbar radiculopathy 08/26/2016  . Iron deficiency anemia 05/20/2016  . Chronic right shoulder pain 02/16/2016  . Acute diastolic heart failure ([place]) 10/30/2015  . Secondary pulmonary hypertension 10/30/2015  . Hypertensive heart disease with heart failure ([place]) 10/30/2015  . TIA (transient ischemic attack)   . 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 Postsurgical hypothyroidism    . OP (osteoporosis)   . Hyperlipidemia   . High blood pressure   . Glaucoma   . CHF (congestive heart failure) ([place])     1:31 PM,11/22/16 Elly Modena PT, DPT [facility] [address] [place], [place], [address] Phone: [phone]   Fax:  [phone]  Name: Eisha  Quintela MRN: [id number] Date of Birth: [birth year]

## 2016-11-25 ENCOUNTER — Other Ambulatory Visit (INDEPENDENT_AMBULATORY_CARE_PROVIDER_SITE_OTHER): Payer: Self-pay | Admitting: Physical Medicine and Rehabilitation

## 2016-11-25 MED ORDER — GABAPENTIN 300 MG PO CAPS: 300.0000 mg | ORAL_CAPSULE | Freq: Three times a day (TID) | ORAL | 3 refills | Status: DC

## 2016-11-26 ENCOUNTER — Encounter: Payer: Self-pay | Admitting: Physical Therapy

## 2016-11-26 ENCOUNTER — Ambulatory Visit: Payer: Medicare Other | Admitting: Physical Therapy

## 2016-11-26 DIAGNOSIS — M6281 Muscle weakness (generalized): Secondary | ICD-10-CM

## 2016-11-26 DIAGNOSIS — M62830 Muscle spasm of back: Secondary | ICD-10-CM

## 2016-11-26 DIAGNOSIS — M5441 Lumbago with sciatica, right side: Secondary | ICD-10-CM

## 2016-11-26 NOTE — Therapy (Signed)
[facility] [address], [place], [place], [address] Phone: [phone]   Fax:  [phone]  Physical Therapy Treatment  Patient Details  Name: Summer Nielsen MRN: [id number] Date of Birth: Nov 23, 1927 Referring Provider: Dr. Magnus Sinning  Encounter Date: 11/26/2016      PT End of Session - 11/26/16 1058    Visit Number 5   Number of Visits 10   Date for PT Re-Evaluation 12/31/16   Authorization Type UHC medicare g-code on 10th visit; kx modifier on 15th   PT Start Time 1015   PT Stop Time 1055   PT Time Calculation (min) 40 min   Activity Tolerance Patient tolerated treatment well;No increased pain   Behavior During Therapy WFL for tasks assessed/performed      Past Medical History:  Diagnosis Date  . CHF (congestive heart failure) ([place])   . Glaucoma   . High blood pressure   . Hyperlipidemia   . Hypothyroidism   . OP (osteoporosis)   . Postsurgical hypothyroidism   . 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 Tachycardia   . TIA (transient ischemic attack)     History reviewed. No pertinent surgical history.  There were no vitals filed for this visit.      Subjective Assessment - 11/26/16 1021    Subjective I had the third shot and I feel so much better. It is easier to get up and make breakfast. Patient can stand better and longer to shower.    How long can you walk comfortably? painfull   Patient Stated Goals walk without pain   Currently in Pain? Yes   Pain Score 4    Pain Location Back   Pain Orientation Right   Pain Descriptors / Indicators Aching   Pain Type Acute pain   Pain Onset More than a month ago   Pain Frequency Intermittent   Aggravating Factors  standing for long periods of time and walking   Pain Relieving Factors resting and sitting with a pillow behind her back   Multiple Pain Sites No                         [facility] Adult  PT Treatment/Exercise - 11/26/16 0001      Lumbar Exercises: Seated   Other Seated Lumbar Exercises low rows and high rows with red TB 2x10 reps, towel roll for lumbar support; seated trunk rotation stretch 10x5 sec each side     Lumbar Exercises: Supine   Isometric Hip Flexion 10 reps;5 seconds  on right   Isometric Hip Flexion Limitations with abdominal bracing; left AAROM for hip flexion     Manual Therapy   Manual Therapy Soft tissue mobilization   Soft tissue mobilization right gluteus medius and quadratus                  PT Short Term Goals - 11/26/16 1103      PT SHORT TERM GOAL #1   Title independent with initial HEP   Time 4   Period Weeks   Status Achieved     PT SHORT TERM GOAL #2   Title walk with pain in right leg decreased >/= 25%   Time 4   Period Weeks   Status Achieved     PT SHORT TERM GOAL #3   Title turn in bed with pain decreased >/= 25%   Time 4   Period Weeks  Status Achieved           PT Long Term Goals - 11/05/16 1031      PT LONG TERM GOAL #1   Title independent with HEP   Time 8   Period Weeks   Status New   Target Date 12/31/16     PT LONG TERM GOAL #2   Title turn in bed with pain in lumbar decreased >/= 75%    Time 8   Period Weeks   Status New   Target Date 12/31/16     PT LONG TERM GOAL #3   Title pain with walking in right leg decreased >/= 75% due to improved strength and improve muscle mobility   Time 8   Period Weeks   Target Date 12/31/16     PT LONG TERM GOAL #4   Title FOTO score </= 41% limitation   Time 8   Period Weeks   Status New   Target Date 12/31/16               Plan - 11/26/16 1059    Clinical Impression Statement Patient has less pain since she had the shot. Patient is walking upright more. Patient had no pain after therapy today.  Patient needs tactile and verbal cues for abdominal bracing due to wanting to bridge.  Patient will benfit from skilled therapy to improve core  strength and reduce pain.    Rehab Potential Excellent   Clinical Impairments Affecting Rehab Potential 3 hip replacements on the left; osteoporosis; CHF; glaucoma; skin cancer   PT Frequency 2x / week   PT Duration 8 weeks   PT Treatment/Interventions Moist Heat;Therapeutic activities;Therapeutic exercise;Neuromuscular re-education;Manual techniques;Dry needling;Patient/family education   PT Next Visit Plan continue to promote gentle lumbar mobility and strength; soft tissue work to lumbar and gluteal; modalities to decrease pain as needed   PT Home Exercise Plan progress as needed   Recommended Other Services MD signed initial note   Consulted and Agree with Plan of Care Patient      Patient will benefit from skilled therapeutic intervention in order to improve the following deficits and impairments:  Pain, Decreased strength, Decreased activity tolerance, Increased fascial restricitons, Increased muscle spasms  Visit Diagnosis: Muscle weakness (generalized)  Acute right-sided low back pain with right-sided sciatica  Muscle spasm of back     Problem List Patient Active Problem List   Diagnosis Date Noted  . Closed displaced fracture of base of fifth metacarpal bone of left hand 10/12/2016  . Lumbar radiculopathy 08/26/2016  . Iron deficiency anemia 05/20/2016  . Chronic right shoulder pain 02/16/2016  . Acute diastolic heart failure ([place]) 10/30/2015  . Secondary pulmonary hypertension 10/30/2015  . Hypertensive heart disease with heart failure ([place]) 10/30/2015  . TIA (transient ischemic attack)   . 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 Postsurgical hypothyroidism   . OP (osteoporosis)   . Hyperlipidemia   . High blood pressure   . Glaucoma   . CHF (congestive heart failure) ([place])     Earlie Counts, PT 11/26/16 11:04 AM   [facility] [address], [place], [place],  [address] Phone: [phone]   Fax:  [phone]  Name: Mikala Podoll MRN: [id number] Date of Birth: June 12, 1927

## 2016-11-29 ENCOUNTER — Ambulatory Visit: Payer: Medicare Other | Admitting: Physical Therapy

## 2016-11-29 ENCOUNTER — Encounter: Payer: Self-pay | Admitting: Physical Therapy

## 2016-11-29 DIAGNOSIS — M6281 Muscle weakness (generalized): Secondary | ICD-10-CM | POA: Diagnosis not present

## 2016-11-29 DIAGNOSIS — M62830 Muscle spasm of back: Secondary | ICD-10-CM

## 2016-11-29 DIAGNOSIS — M5441 Lumbago with sciatica, right side: Secondary | ICD-10-CM

## 2016-11-29 NOTE — Therapy (Signed)
[facility] [address], [place], [place], [address] Phone: [phone]   Fax:  [phone]  Physical Therapy Treatment  Patient Details  Name: Summer Nielsen MRN: [id number] Date of Birth: Sep 17, 1927 Referring Provider: Dr. Magnus Sinning  Encounter Date: 11/29/2016      PT End of Session - 11/29/16 0945    Visit Number 6   Number of Visits 10   Date for PT Re-Evaluation 12/31/16   Authorization Type UHC medicare g-code on 10th visit; kx modifier on 15th   PT Start Time 0930   PT Stop Time 1010   PT Time Calculation (min) 40 min   Activity Tolerance Patient tolerated treatment well;No increased pain   Behavior During Therapy WFL for tasks assessed/performed      Past Medical History:  Diagnosis Date  . CHF (congestive heart failure) ([place])   . Glaucoma   . High blood pressure   . Hyperlipidemia   . Hypothyroidism   . OP (osteoporosis)   . Postsurgical hypothyroidism   . 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 Tachycardia   . TIA (transient ischemic attack)     History reviewed. No pertinent surgical history.  There were no vitals filed for this visit.      Subjective Assessment - 11/29/16 0933    Subjective My back pain is 50% better.    How long can you walk comfortably? painfull   Patient Stated Goals walk without pain   Currently in Pain? No/denies                         [facility] Adult PT Treatment/Exercise - 11/29/16 0001      Self-Care   Self-Care Other Self-Care Comments   Other Self-Care Comments  education on herniated disc and how it affects the nerve     Lumbar Exercises: Aerobic   Stationary Bike nustep L1 x 7 min; seat #7; arm #7  no back pain     Lumbar Exercises: Seated   Other Seated Lumbar Exercises low rows and high rows with green TB 2x10 reps, towel roll for lumbar support; seated trunk rotation stretch 10x5 sec each side      Lumbar Exercises: Supine   Ab Set 10 reps;5 seconds   AB Set Limitations tactile cues to lower abdominal to feel the contraction   Other Supine Lumbar Exercises hookly abdominal contraction with alternate shoulder flexion 20x   Other Supine Lumbar Exercises hookly hold red plyoball move side to side and up/down 15x each with abdominal contraction     Manual Therapy   Manual Therapy Soft tissue mobilization   Soft tissue mobilization right gluteus medius and quadratus                PT Education - 11/29/16 1011    Education provided Yes   Education Details educated patient on lumbar spine anatomy and herniated disc   Person(s) Educated Patient   Methods Explanation   Comprehension Verbalized understanding          PT Short Term Goals - 11/26/16 1103      PT SHORT TERM GOAL #1   Title independent with initial HEP   Time 4   Period Weeks   Status Achieved     PT SHORT TERM GOAL #2   Title walk with pain in right leg decreased >/= 25%   Time 4   Period Weeks   Status Achieved  PT SHORT TERM GOAL #3   Title turn in bed with pain decreased >/= 25%   Time 4   Period Weeks   Status Achieved           PT Long Term Goals - 11/29/16 0945      PT LONG TERM GOAL #1   Title independent with HEP   Baseline still learning   Time 8   Period Weeks   Status On-going     PT LONG TERM GOAL #2   Title turn in bed with pain in lumbar decreased >/= 75%    Baseline 30% less pain   Time 8   Period Weeks   Status On-going     PT LONG TERM GOAL #3   Title pain with walking in right leg decreased >/= 75% due to improved strength and improve muscle mobility   Baseline no pain in right leg   Time 8   Period Weeks   Status Achieved     PT LONG TERM GOAL #4   Title FOTO score </= 41% limitation   Time 8   Period Weeks   Status New               Plan - 11/29/16 0947    Clinical Impression Statement Patient is not able to sleep through the night in  her bed compared to sleeping in the recliner and waking up frequently due to back pain. Patient reports no pain in right leg.  Patient has 30% less pain with turning in bed.  Patient rpeorts 50% decreased in overall pain.  Patient is standing taller when walking. Patient will benefit from skilled therapy to improve core strength and reduce pain.    Rehab Potential Excellent   Clinical Impairments Affecting Rehab Potential 3 hip replacements on the left; osteoporosis; CHF; glaucoma; skin cancer   PT Frequency 2x / week   PT Duration 8 weeks   PT Treatment/Interventions Moist Heat;Therapeutic activities;Therapeutic exercise;Neuromuscular re-education;Manual techniques;Dry needling;Patient/family education   PT Next Visit Plan continue to promote gentle lumbar mobility and strength; soft tissue work to lumbar and gluteal; modalities to decrease pain as needed   PT Home Exercise Plan progress as needed   Consulted and Agree with Plan of Care Patient      Patient will benefit from skilled therapeutic intervention in order to improve the following deficits and impairments:  Pain, Decreased strength, Decreased activity tolerance, Increased fascial restricitons, Increased muscle spasms  Visit Diagnosis: Muscle weakness (generalized)  Acute right-sided low back pain with right-sided sciatica  Muscle spasm of back     Problem List Patient Active Problem List   Diagnosis Date Noted  . Closed displaced fracture of base of fifth metacarpal bone of left hand 10/12/2016  . Lumbar radiculopathy 08/26/2016  . Iron deficiency anemia 05/20/2016  . Chronic right shoulder pain 02/16/2016  . Acute diastolic heart failure ([place]) 10/30/2015  . Secondary pulmonary hypertension 10/30/2015  . Hypertensive heart disease with heart failure ([place]) 10/30/2015  . TIA (transient ischemic attack)   . 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 Postsurgical hypothyroidism   . OP  (osteoporosis)   . Hyperlipidemia   . High blood pressure   . Glaucoma   . CHF (congestive heart failure) ([place])     Earlie Counts, PT 11/29/16 10:14 AM   [facility] [address], [place], [place], [address] Phone: [phone]   Fax:  [phone]  Name:  Summer Nielsen MRN: [id number] Date of Birth: [birth year]

## 2016-12-03 ENCOUNTER — Ambulatory Visit: Payer: Medicare Other | Admitting: Physical Therapy

## 2016-12-03 DIAGNOSIS — M62830 Muscle spasm of back: Secondary | ICD-10-CM

## 2016-12-03 DIAGNOSIS — M6281 Muscle weakness (generalized): Secondary | ICD-10-CM | POA: Diagnosis not present

## 2016-12-03 DIAGNOSIS — M5441 Lumbago with sciatica, right side: Secondary | ICD-10-CM

## 2016-12-03 NOTE — Therapy (Signed)
[facility] [address] [place], [place], [address] Phone: [phone]   Fax:  [phone]  Physical Therapy Treatment  Patient Details  Name: Summer Nielsen MRN: [id number] Date of Birth: 01-24-1928 Referring Provider: Dr. Magnus Sinning  Encounter Date: 12/03/2016      PT End of Session - 12/03/16 1117    Visit Number 7   Number of Visits 10   Date for PT Re-Evaluation 12/31/16   Authorization Type UHC medicare g-code on 10th visit; kx modifier on 15th   PT Start Time 1103   PT Stop Time 1145   PT Time Calculation (min) 42 min   Activity Tolerance Patient tolerated treatment well;No increased pain   Behavior During Therapy WFL for tasks assessed/performed      Past Medical History:  Diagnosis Date  . CHF (congestive heart failure) ([place])   . Glaucoma   . High blood pressure   . Hyperlipidemia   . Hypothyroidism   . OP (osteoporosis)   . Postsurgical hypothyroidism   . 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 Tachycardia   . TIA (transient ischemic attack)     No past surgical history on file.  There were no vitals filed for this visit.      Subjective Assessment - 12/03/16 1107    Subjective Pt reports that her back is a little more sore today. She continues to complete her exercises without issues. She is now able to sleep in her bed instead of the recliner.    How long can you walk comfortably? painfull   Patient Stated Goals walk without pain   Currently in Pain? Yes   Pain Score 5    Pain Location Back   Pain Orientation Right   Pain Descriptors / Indicators Aching   Pain Type Acute pain   Pain Radiating Towards none    Pain Onset More than a month ago   Pain Frequency Intermittent   Aggravating Factors  doing too much movement and being up on her feet walking around too long    Pain Relieving Factors resting and sitting with pillow behind her back                   [facility] Adult PT Treatment/Exercise - 12/03/16 0001      Lumbar Exercises: Aerobic   Stationary Bike Nustep L1 x2 min, increased to L2 x2 min  seat and UE at level 7     Lumbar Exercises: Seated   Other Seated Lumbar Exercises trunk rotation stretch x10 reps Lt/Rt     Lumbar Exercises: Supine   Other Supine Lumbar Exercises hooklying abdominal contraction with ball squeeze x15 reps; also with alternating shoulder flexion x20 reps    Other Supine Lumbar Exercises Hooklying abdominal bracing with horizontal abduction using yellow TB 2x10 reps; BLE flexion and trunk rotation Lt/Rt on red physioball x15 reps each      Manual Therapy   Soft tissue mobilization right gluteus medius/maximus and quadratus                PT Education - 12/03/16 1141    Education provided Yes   Education Details technique with therex    Person(s) Educated Patient   Methods Explanation;Verbal cues;Tactile cues   Comprehension Verbalized understanding;Returned demonstration          PT Short Term Goals - 11/26/16 1103      PT SHORT TERM GOAL #1   Title independent  with initial HEP   Time 4   Period Weeks   Status Achieved     PT SHORT TERM GOAL #2   Title walk with pain in right leg decreased >/= 25%   Time 4   Period Weeks   Status Achieved     PT SHORT TERM GOAL #3   Title turn in bed with pain decreased >/= 25%   Time 4   Period Weeks   Status Achieved           PT Long Term Goals - 11/29/16 0945      PT LONG TERM GOAL #1   Title independent with HEP   Baseline still learning   Time 8   Period Weeks   Status On-going     PT LONG TERM GOAL #2   Title turn in bed with pain in lumbar decreased >/= 75%    Baseline 30% less pain   Time 8   Period Weeks   Status On-going     PT LONG TERM GOAL #3   Title pain with walking in right leg decreased >/= 75% due to improved strength and improve muscle mobility   Baseline no pain in right leg   Time 8    Period Weeks   Status Achieved     PT LONG TERM GOAL #4   Title FOTO score </= 41% limitation   Time 8   Period Weeks   Status New               Plan - 12/03/16 1142    Clinical Impression Statement Pt making steady progress towards her goals with report of being able to sleep through the night for the first time this week. Session continued with deep abdominal strengthening and ended with soft tissue mobilization to address significant muscle spasm throughout the Rt gluteals. Pt reported no increase in pain following today's activities. She would continue to benefit from skilled PT to address limitations in ROM, strength and mobility.    Rehab Potential Excellent   Clinical Impairments Affecting Rehab Potential 3 hip replacements on the left; osteoporosis; CHF; glaucoma; skin cancer   PT Frequency 2x / week   PT Duration 8 weeks   PT Treatment/Interventions Moist Heat;Therapeutic activities;Therapeutic exercise;Neuromuscular re-education;Manual techniques;Dry needling;Patient/family education   PT Next Visit Plan continue to promote gentle lumbar mobility and strength; soft tissue work to lumbar and gluteal; modalities to decrease pain as needed   PT Home Exercise Plan progress as needed   Consulted and Agree with Plan of Care Patient      Patient will benefit from skilled therapeutic intervention in order to improve the following deficits and impairments:  Pain, Decreased strength, Decreased activity tolerance, Increased fascial restricitons, Increased muscle spasms  Visit Diagnosis: Muscle weakness (generalized)  Acute right-sided low back pain with right-sided sciatica  Muscle spasm of back     Problem List Patient Active Problem List   Diagnosis Date Noted  . Closed displaced fracture of base of fifth metacarpal bone of left hand 10/12/2016  . Lumbar radiculopathy 08/26/2016  . Iron deficiency anemia 05/20/2016  . Chronic right shoulder pain 02/16/2016  .  Acute diastolic heart failure ([place]) 10/30/2015  . Secondary pulmonary hypertension 10/30/2015  . Hypertensive heart disease with heart failure ([place]) 10/30/2015  . TIA (transient ischemic attack)   . 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 Postsurgical hypothyroidism   . OP (osteoporosis)   . Hyperlipidemia   . High  blood pressure   . Glaucoma   . CHF (congestive heart failure) [facility])     11:53 AM,12/03/16 Elly Modena PT, DPT [facility] [address], [place], [place], [address] Phone: [phone]   Fax:  [phone]  Name: Summer Nielsen MRN: [id number] Date of Birth: October 07, 1927

## 2016-12-06 ENCOUNTER — Ambulatory Visit: Payer: Medicare Other | Admitting: Physical Therapy

## 2016-12-08 ENCOUNTER — Ambulatory Visit: Payer: Medicare Other | Attending: Physical Medicine and Rehabilitation | Admitting: Physical Therapy

## 2016-12-08 ENCOUNTER — Encounter: Payer: Self-pay | Admitting: Physical Therapy

## 2016-12-08 DIAGNOSIS — M62830 Muscle spasm of back: Secondary | ICD-10-CM | POA: Diagnosis present

## 2016-12-08 DIAGNOSIS — M5441 Lumbago with sciatica, right side: Secondary | ICD-10-CM

## 2016-12-08 DIAGNOSIS — M6281 Muscle weakness (generalized): Secondary | ICD-10-CM | POA: Diagnosis present

## 2016-12-08 NOTE — Therapy (Signed)
[facility] [address] [place], [place], [address] Phone: [phone]   Fax:  [phone]  Physical Therapy Treatment  Patient Details  Name: Summer Nielsen MRN: [id number] Date of Birth: 1927/11/25 Referring Provider: Dr. Magnus Sinning  Encounter Date: 12/08/2016      PT End of Session - 12/08/16 1100    Visit Number 8   Number of Visits 10   Date for PT Re-Evaluation 12/31/16   Authorization Type UHC medicare g-code on 10th visit; kx modifier on 15th   PT Start Time 1100   PT Stop Time 1140   PT Time Calculation (min) 40 min   Activity Tolerance Patient tolerated treatment well;No increased pain   Behavior During Therapy WFL for tasks assessed/performed      Past Medical History:  Diagnosis Date  . CHF (congestive heart failure) ([place])   . Glaucoma   . High blood pressure   . Hyperlipidemia   . Hypothyroidism   . OP (osteoporosis)   . Postsurgical hypothyroidism   . 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 Tachycardia   . TIA (transient ischemic attack)     History reviewed. No pertinent surgical history.  There were no vitals filed for this visit.      Subjective Assessment - 12/08/16 1101    Subjective My leg is good but my back still gives me troubles.    Currently in Pain? Yes   Pain Score 5    Pain Location Back   Pain Orientation Right   Pain Descriptors / Indicators Aching   Aggravating Factors  doing stuff   Pain Relieving Factors sitting   Multiple Pain Sites No                         [facility] Adult PT Treatment/Exercise - 12/08/16 0001      Lumbar Exercises: Stretches   Single Knee to Chest Stretch 2 reps;30 seconds   Lower Trunk Rotation 3 reps;20 seconds     Lumbar Exercises: Aerobic   Stationary Bike Nustep L2 x 5 min     Lumbar Exercises: Seated   Other Seated Lumbar Exercises red band horizontal abd with focus on  sitting posture and contracting abdominals 2x 5      Lumbar Exercises: Supine   Ab Set 10 reps;3 seconds  Added ball squeeze and glute squeeze 10x post initial TA wor   Clam 20 reps   Bent Knee Raise 20 reps  RT only   Other Supine Lumbar Exercises Leg lengthener stretch RT 3x 3 sec     Moist Heat Therapy   Number Minutes Moist Heat --  During supine exs                  PT Short Term Goals - 11/26/16 1103      PT SHORT TERM GOAL #1   Title independent with initial HEP   Time 4   Period Weeks   Status Achieved     PT SHORT TERM GOAL #2   Title walk with pain in right leg decreased >/= 25%   Time 4   Period Weeks   Status Achieved     PT SHORT TERM GOAL #3   Title turn in bed with pain decreased >/= 25%   Time 4   Period Weeks   Status Achieved           PT Long Term Goals -  11/29/16 0945      PT LONG TERM GOAL #1   Title independent with HEP   Baseline still learning   Time 8   Period Weeks   Status On-going     PT LONG TERM GOAL #2   Title turn in bed with pain in lumbar decreased >/= 75%    Baseline 30% less pain   Time 8   Period Weeks   Status On-going     PT LONG TERM GOAL #3   Title pain with walking in right leg decreased >/= 75% due to improved strength and improve muscle mobility   Baseline no pain in right leg   Time 8   Period Weeks   Status Achieved     PT LONG TERM GOAL #4   Title FOTO score </= 41% limitation   Time 8   Period Weeks   Status New               Plan - 12/08/16 1100    Clinical Impression Statement Pt presents today with reports that her leg pain is completely abolished, and remains with intermittent back pain that worsens with movement. Pt performed supine and seated core stabilization exercises without any reports of pain and upon finishing her session pain had abolished. when walking around the clinic.    Rehab Potential Excellent   Clinical Impairments Affecting Rehab Potential 3 hip  replacements on the left; osteoporosis; CHF; glaucoma; skin cancer   PT Frequency 2x / week   PT Duration 8 weeks   PT Treatment/Interventions Moist Heat;Therapeutic activities;Therapeutic exercise;Neuromuscular re-education;Manual techniques;Dry needling;Patient/family education   Consulted and Agree with Plan of Care Patient      Patient will benefit from skilled therapeutic intervention in order to improve the following deficits and impairments:  Pain, Decreased strength, Decreased activity tolerance, Increased fascial restricitons, Increased muscle spasms  Visit Diagnosis: Muscle weakness (generalized)  Acute right-sided low back pain with right-sided sciatica  Muscle spasm of back     Problem List Patient Active Problem List   Diagnosis Date Noted  . Closed displaced fracture of base of fifth metacarpal bone of left hand 10/12/2016  . Lumbar radiculopathy 08/26/2016  . Iron deficiency anemia 05/20/2016  . Chronic right shoulder pain 02/16/2016  . Acute diastolic heart failure ([place]) 10/30/2015  . Secondary pulmonary hypertension 10/30/2015  . Hypertensive heart disease with heart failure ([place]) 10/30/2015  . TIA (transient ischemic attack)   . 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 Postsurgical hypothyroidism   . OP (osteoporosis)   . Hyperlipidemia   . High blood pressure   . Glaucoma   . CHF (congestive heart failure) ([place])     Aland Chestnutt, PTA 12/08/2016, 11:43 AM  [facility] [address] [place], [place], [address] Phone: [phone]   Fax:  [phone]  Name: Summer Nielsen MRN: [id number] Date of Birth: 1927-04-23

## 2016-12-10 ENCOUNTER — Ambulatory Visit: Payer: Medicare Other | Admitting: Physical Therapy

## 2016-12-10 DIAGNOSIS — M5441 Lumbago with sciatica, right side: Secondary | ICD-10-CM

## 2016-12-10 DIAGNOSIS — M6281 Muscle weakness (generalized): Secondary | ICD-10-CM | POA: Diagnosis not present

## 2016-12-10 DIAGNOSIS — M62830 Muscle spasm of back: Secondary | ICD-10-CM

## 2016-12-10 NOTE — Therapy (Signed)
[facility] [address] [place], [place], [address] Phone: [phone]   Fax:  [phone]  Physical Therapy Treatment  Patient Details  Name: Summer Nielsen MRN: [id number] Date of Birth: 09-27-1927 Referring Provider: Dr. Magnus Sinning  Encounter Date: 12/10/2016      PT End of Session - 12/10/16 1056    Visit Number 9   Number of Visits 10   Date for PT Re-Evaluation 12/31/16   Authorization Type UHC medicare g-code on 10th visit; kx modifier on 15th   PT Start Time 1055   PT Stop Time 1133   PT Time Calculation (min) 38 min   Activity Tolerance Patient tolerated treatment well;No increased pain   Behavior During Therapy WFL for tasks assessed/performed      Past Medical History:  Diagnosis Date  . CHF (congestive heart failure) ([place])   . Glaucoma   . High blood pressure   . Hyperlipidemia   . Hypothyroidism   . OP (osteoporosis)   . Postsurgical hypothyroidism   . 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 Tachycardia   . TIA (transient ischemic attack)     No past surgical history on file.  There were no vitals filed for this visit.      Subjective Assessment - 12/10/16 1057    Subjective I feel pretty good today. Pain is intermittent and not as intense.   Currently in Pain? Yes   Pain Score 2    Pain Location Back   Pain Orientation Lower;Right   Pain Descriptors / Indicators Dull   Multiple Pain Sites No                         [facility] Adult PT Treatment/Exercise - 12/10/16 0001      Lumbar Exercises: Stretches   Single Knee to Chest Stretch 2 reps;30 seconds   Lower Trunk Rotation 3 reps;20 seconds     Lumbar Exercises: Aerobic   Stationary Bike Nustep L2 x 6 min     Lumbar Exercises: Seated   Other Seated Lumbar Exercises red band horizontal abd with focus on sitting posture and contracting abdominals 2x 10   Vc to lift her  heart( thoracic ext)     Lumbar Exercises: Supine   Ab Set 10 reps;3 seconds  Added ball squeeze and glute squeeze 10x post initial TA wor   Clam 20 reps   Bent Knee Raise 20 reps  RT only   Other Supine Lumbar Exercises Leg lengthener stretch RT 3x 3 sec   Other Supine Lumbar Exercises red plyoball diagonals 2x5 bil  Vc to imprint into the MHP     Moist Heat Therapy   Number Minutes Moist Heat --  During supine and seated exs                  PT Short Term Goals - 11/26/16 1103      PT SHORT TERM GOAL #1   Title independent with initial HEP   Time 4   Period Weeks   Status Achieved     PT SHORT TERM GOAL #2   Title walk with pain in right leg decreased >/= 25%   Time 4   Period Weeks   Status Achieved     PT SHORT TERM GOAL #3   Title turn in bed with pain decreased >/= 25%   Time 4   Period Weeks   Status Achieved  PT Long Term Goals - 11/29/16 0945      PT LONG TERM GOAL #1   Title independent with HEP   Baseline still learning   Time 8   Period Weeks   Status On-going     PT LONG TERM GOAL #2   Title turn in bed with pain in lumbar decreased >/= 75%    Baseline 30% less pain   Time 8   Period Weeks   Status On-going     PT LONG TERM GOAL #3   Title pain with walking in right leg decreased >/= 75% due to improved strength and improve muscle mobility   Baseline no pain in right leg   Time 8   Period Weeks   Status Achieved     PT LONG TERM GOAL #4   Title FOTO score </= 41% limitation   Time 8   Period Weeks   Status New               Plan - 12/10/16 1056    Clinical Impression Statement Pt presents very pleased with her progress to date. Her main complaint which was pain is significantly beter, pretty much abolished in her leg and less intense/more intermittent in her back pain. She can manage her pain by sitting for a short while. Remains with limited standing time before her back pain increases.    Rehab  Potential Excellent   Clinical Impairments Affecting Rehab Potential 3 hip replacements on the left; osteoporosis; CHF; glaucoma; skin cancer   PT Frequency 2x / week   PT Duration 8 weeks   PT Treatment/Interventions Moist Heat;Therapeutic activities;Therapeutic exercise;Neuromuscular re-education;Manual techniques;Dry needling;Patient/family education   PT Next Visit Plan G codes next, Lumbar mobliity and strength   Consulted and Agree with Plan of Care --      Patient will benefit from skilled therapeutic intervention in order to improve the following deficits and impairments:  Pain, Decreased strength, Decreased activity tolerance, Increased fascial restricitons, Increased muscle spasms  Visit Diagnosis: Muscle weakness (generalized)  Acute right-sided low back pain with right-sided sciatica  Muscle spasm of back     Problem List Patient Active Problem List   Diagnosis Date Noted  . Closed displaced fracture of base of fifth metacarpal bone of left hand 10/12/2016  . Lumbar radiculopathy 08/26/2016  . Iron deficiency anemia 05/20/2016  . Chronic right shoulder pain 02/16/2016  . Acute diastolic heart failure ([place]) 10/30/2015  . Secondary pulmonary hypertension 10/30/2015  . Hypertensive heart disease with heart failure ([place]) 10/30/2015  . TIA (transient ischemic attack)   . 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 Postsurgical hypothyroidism   . OP (osteoporosis)   . Hyperlipidemia   . High blood pressure   . Glaucoma   . CHF (congestive heart failure) ([place])     COCHRAN,JENNIFER,PTA  12/10/2016, 11:31 AM  [facility] [address] [place], [place], [address] Phone: [phone]   Fax:  [phone]  Name: Summer Nielsen MRN: [id number] Date of Birth: 1927/12/09

## 2016-12-15 ENCOUNTER — Ambulatory Visit: Payer: Medicare Other | Admitting: Physical Therapy

## 2016-12-15 DIAGNOSIS — M6281 Muscle weakness (generalized): Secondary | ICD-10-CM | POA: Diagnosis not present

## 2016-12-15 DIAGNOSIS — M62830 Muscle spasm of back: Secondary | ICD-10-CM

## 2016-12-15 DIAGNOSIS — M5441 Lumbago with sciatica, right side: Secondary | ICD-10-CM

## 2016-12-15 NOTE — Therapy (Signed)
[facility] [address], [place], [place], [address] Phone: [phone]   Fax:  [phone]  Physical Therapy Treatment  Patient Details  Name: Summer Nielsen MRN: [id number] Date of Birth: [birth year] Referring Provider: Dr. Magnus Sinning  Encounter Date: 12/15/2016      PT End of Session - 12/15/16 1111    Visit Number 10   Number of Visits 20   Date for PT Re-Evaluation 12/31/16   Authorization Type U[employer] medicare g-code on 10th visit; kx modifier on 15th   PT Start Time 1016   PT Stop Time 1100   PT Time Calculation (min) 44 min   Activity Tolerance Patient tolerated treatment well;No increased pain   Behavior During Therapy WFL for tasks assessed/performed      Past Medical History:  Diagnosis Date  . CHF (congestive heart failure) ([place])   . Glaucoma   . High blood pressure   . Hyperlipidemia   . Hypothyroidism   . OP (osteoporosis)   . Postsurgical hypothyroidism   . 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 Tachycardia   . TIA (transient ischemic attack)     No past surgical history on file.  There were no vitals filed for this visit.      Subjective Assessment - 12/15/16 1019    Subjective Pt reports that things are going well. She continues to have the low back pain, but her leg is much better.    Currently in Pain? Yes   Pain Score 4    Pain Location Back   Pain Orientation Right;Lower   Pain Descriptors / Indicators Dull   Pain Type Acute pain   Pain Radiating Towards none    Pain Onset More than a month ago   Pain Frequency Intermittent   Aggravating Factors  alot of activity   Pain Relieving Factors sitting, heat             [facility] PT Assessment - 12/15/16 0001      Observation/Other Assessments   Focus on Therapeutic Outcomes (FOTO)  47% limited                     [facility] Adult PT Treatment/Exercise - 12/15/16 0001      Lumbar Exercises: Aerobic   Stationary Bike L2 x4 min prior to start of session      Lumbar Exercises: Seated   Other Seated Lumbar Exercises red band horizontal abd with focus on sitting posture and contracting abdominals 2x 10   towel roll for lumbar support     Lumbar Exercises: Supine   Bent Knee Raise 20 reps;2 seconds   Other Supine Lumbar Exercises hooklying abdominal contraction with ball squeeze isometric x15 reps    Other Supine Lumbar Exercises BUE press into beach ball during exhale x20 reps      Moist Heat Therapy   Number Minutes Moist Heat --  during supine therex    Moist Heat Location Lumbar Spine     Manual Therapy   Soft tissue mobilization right gluteus medius/maximus and quadratus in Lt sidelying                 PT Education - 12/15/16 1109    Education provided Yes   Education Details discussed adjustments that can be made with therex at home to allow for better comfort and technique   Person(s) Educated Patient   Methods Explanation   Comprehension Verbalized understanding  PT Short Term Goals - 11/26/16 1103      PT SHORT TERM GOAL #1   Title independent with initial HEP   Time 4   Period Weeks   Status Achieved     PT SHORT TERM GOAL #2   Title walk with pain in right leg decreased >/= 25%   Time 4   Period Weeks   Status Achieved     PT SHORT TERM GOAL #3   Title turn in bed with pain decreased >/= 25%   Time 4   Period Weeks   Status Achieved           PT Long Term Goals - 11/29/16 0945      PT LONG TERM GOAL #1   Title independent with HEP   Baseline still learning   Time 8   Period Weeks   Status On-going     PT LONG TERM GOAL #2   Title turn in bed with pain in lumbar decreased >/= 75%    Baseline 30% less pain   Time 8   Period Weeks   Status On-going     PT LONG TERM GOAL #3   Title pain with walking in right leg decreased >/= 75% due to improved strength and improve muscle mobility   Baseline  no pain in right leg   Time 8   Period Weeks   Status Achieved     PT LONG TERM GOAL #4   Title FOTO score </= 41% limitation   Time 8   Period Weeks   Status New               Plan - Jan 07, 2017 1112    Clinical Impression Statement Pt is making progress towards her goals, with a drop in her FOTO limitation to 47% compared to over 60% that it was during her evaluation. She is demonstrating more upright posture when standing and is able to complete more exercises during her sessions with decreased pain report and signs of fatigue. Continued with therex and manual techniques to address pain and improve abdominal stability. Pt reporting no increase in her symptoms following this session.   Rehab Potential Excellent   Clinical Impairments Affecting Rehab Potential 3 hip replacements on the left; osteoporosis; CHF; glaucoma; skin cancer   PT Frequency 2x / week   PT Duration 8 weeks   PT Treatment/Interventions Moist Heat;Therapeutic activities;Therapeutic exercise;Neuromuscular re-education;Manual techniques;Dry needling;Patient/family education   PT Next Visit Plan Lumbar mobliity and strength; manual techniques as needed for muscle spasm/pain control.    Consulted and Agree with Plan of Care Patient      Patient will benefit from skilled therapeutic intervention in order to improve the following deficits and impairments:  Pain, Decreased strength, Decreased activity tolerance, Increased fascial restricitons, Increased muscle spasms  Visit Diagnosis: Muscle weakness (generalized)  Acute right-sided low back pain with right-sided sciatica  Muscle spasm of back       G-Codes - 2017-01-07 1040    Functional Assessment Tool Used (Outpatient Only) FOTO: 47% limited   Functional Limitation Mobility: Walking and moving around   Mobility: Walking and Moving Around Current Status [phone]) At least 40 percent but less than 60 percent impaired, limited or restricted   Mobility: Walking  and Moving Around Goal Status [phone]) At least 40 percent but less than 60 percent impaired, limited or restricted      Problem List Patient Active Problem List   Diagnosis Date Noted  . Closed displaced fracture  of base of fifth metacarpal bone of left hand 10/12/2016  . Lumbar radiculopathy 08/26/2016  . Iron deficiency anemia 05/20/2016  . Chronic right shoulder pain 02/16/2016  . Acute diastolic heart failure ([place]) 10/30/2015  . Secondary pulmonary hypertension 10/30/2015  . Hypertensive heart disease with heart failure ([place]) 10/30/2015  . TIA (transient ischemic attack)   . 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 Postsurgical hypothyroidism   . OP (osteoporosis)   . Hyperlipidemia   . High blood pressure   . Glaucoma   . CHF (congestive heart failure) ([place])     11:18 AM,12/15/16 Elly Modena PT, DPT [facility] [facility] [address], [place], [place], [address] Phone: [phone]   Fax:  [phone]  Name: Aislee Landgren MRN: [id number] Date of Birth: 10/27/1927

## 2016-12-17 ENCOUNTER — Ambulatory Visit: Payer: Medicare Other | Admitting: Physical Therapy

## 2016-12-17 DIAGNOSIS — M6281 Muscle weakness (generalized): Secondary | ICD-10-CM | POA: Diagnosis not present

## 2016-12-17 DIAGNOSIS — M62830 Muscle spasm of back: Secondary | ICD-10-CM

## 2016-12-17 DIAGNOSIS — M5441 Lumbago with sciatica, right side: Secondary | ICD-10-CM

## 2016-12-17 NOTE — Therapy (Signed)
[facility] [address], [place], [place], [address] Phone: [phone]   Fax:  [phone]  Physical Therapy Treatment  Patient Details  Name: Summer Nielsen MRN: [id number] Date of Birth: [birth year] Referring Provider: Dr. Magnus Sinning  Encounter Date: 12/17/2016      PT End of Session - 12/17/16 1100    Visit Number 11   Number of Visits 20   Date for PT Re-Evaluation 12/31/16   Authorization Type U[employer] medicare g-code on 10th visit; kx modifier on 15th   PT Start Time 1016   PT Stop Time 1058   PT Time Calculation (min) 42 min   Activity Tolerance Patient tolerated treatment well;No increased pain   Behavior During Therapy WFL for tasks assessed/performed      Past Medical History:  Diagnosis Date  . CHF (congestive heart failure) ([place])   . Glaucoma   . High blood pressure   . Hyperlipidemia   . Hypothyroidism   . OP (osteoporosis)   . Postsurgical hypothyroidism   . 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 Tachycardia   . TIA (transient ischemic attack)     No past surgical history on file.  There were no vitals filed for this visit.      Subjective Assessment - 12/17/16 1020    Subjective Pt reports that things are going well. She has some burning in the Rt low back today and is concerned about her spine sticking out more than usual.    Currently in Pain? Yes   Pain Score 4    Pain Location Back   Pain Onset More than a month ago                         [facility] Adult PT Treatment/Exercise - 12/17/16 0001      Lumbar Exercises: Seated   Other Seated Lumbar Exercises trunk rotation Rt and Lt x10 reps each;  pelvic tilts ant/post 2x10 reps (heavy tactile/verbal cues initially);  lateral pelvic tilts x10 reps;  low rows with blue TB x10 reps      Lumbar Exercises: Supine   Other Supine Lumbar Exercises Horizontal shoulder abduction with  red TB 2x15 reps;  red TB D2 flexion diagonal x10 reps each;  red TB shoulder ER with towel squeeze 2x10 reps  with abdominal bracing                 PT Education - 12/17/16 1100    Education provided Yes   Education Details technique with therex    Person(s) Educated Patient   Methods Explanation;Tactile cues;Verbal cues   Comprehension Verbalized understanding;Returned demonstration          PT Short Term Goals - 11/26/16 1103      PT SHORT TERM GOAL #1   Title independent with initial HEP   Time 4   Period Weeks   Status Achieved     PT SHORT TERM GOAL #2   Title walk with pain in right leg decreased >/= 25%   Time 4   Period Weeks   Status Achieved     PT SHORT TERM GOAL #3   Title turn in bed with pain decreased >/= 25%   Time 4   Period Weeks   Status Achieved           PT Long Term Goals - 11/29/16 0945      PT LONG TERM GOAL #1  Title independent with HEP   Baseline still learning   Time 8   Period Weeks   Status On-going     PT LONG TERM GOAL #2   Title turn in bed with pain in lumbar decreased >/= 75%    Baseline 30% less pain   Time 8   Period Weeks   Status On-going     PT LONG TERM GOAL #3   Title pain with walking in right leg decreased >/= 75% due to improved strength and improve muscle mobility   Baseline no pain in right leg   Time 8   Period Weeks   Status Achieved     PT LONG TERM GOAL #4   Title FOTO score </= 41% limitation   Time 8   Period Weeks   Status New               Plan - 12/17/16 1121    Clinical Impression Statement Pt continues to report dull ache in her Rt low buttock/hip region in addition to increased stiffness in the mornings. Session continued with deep abdominal bracing activity with addition of UE exercises, noting significant improvements in pt's ability to activate proper muscles without cuing. Added trunk rotation activity as well as pelvic tilts to improve spine mobility and assist  with improving stiffness in the mornings. Pt demonstrated good understanding of this by the end of today's session without any exacerbation of her pain.    Rehab Potential Excellent   Clinical Impairments Affecting Rehab Potential 3 hip replacements on the left; osteoporosis; CHF; glaucoma; skin cancer   PT Frequency 2x / week   PT Duration 8 weeks   PT Treatment/Interventions Moist Heat;Therapeutic activities;Therapeutic exercise;Neuromuscular re-education;Manual techniques;Dry needling;Patient/family education   PT Next Visit Plan progress Lumbar mobliity and strength in more upright positions; manual techniques as needed for muscle spasm/pain control.    PT Home Exercise Plan progress as needed   Consulted and Agree with Plan of Care Patient      Patient will benefit from skilled therapeutic intervention in order to improve the following deficits and impairments:  Pain, Decreased strength, Decreased activity tolerance, Increased fascial restricitons, Increased muscle spasms  Visit Diagnosis: Muscle weakness (generalized)  Acute right-sided low back pain with right-sided sciatica  Muscle spasm of back     Problem List Patient Active Problem List   Diagnosis Date Noted  . Closed displaced fracture of base of fifth metacarpal bone of left hand 10/12/2016  . Lumbar radiculopathy 08/26/2016  . Iron deficiency anemia 05/20/2016  . Chronic right shoulder pain 02/16/2016  . Acute diastolic heart failure ([place]) 10/30/2015  . Secondary pulmonary hypertension 10/30/2015  . Hypertensive heart disease with heart failure ([place]) 10/30/2015  . TIA (transient ischemic attack)   . 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 Postsurgical hypothyroidism   . OP (osteoporosis)   . Hyperlipidemia   . High blood pressure   . Glaucoma   . CHF (congestive heart failure) [facility])     5:28 PM,12/17/16 Elly Modena PT, DPT [place] [facility] [address] [place], [place], [address] Phone: [phone]   Fax:  [phone]  Name: Chandra Asher MRN: [id number] Date of Birth: Jan 01, 1928

## 2016-12-20 ENCOUNTER — Other Ambulatory Visit (INDEPENDENT_AMBULATORY_CARE_PROVIDER_SITE_OTHER): Payer: Self-pay | Admitting: Orthopaedic Surgery

## 2016-12-21 NOTE — Telephone Encounter (Signed)
Ok to rf?

## 2016-12-21 NOTE — Telephone Encounter (Signed)
Yes #30

## 2016-12-21 NOTE — Telephone Encounter (Signed)
Called to pharmacy 

## 2016-12-22 ENCOUNTER — Ambulatory Visit: Payer: Medicare Other | Admitting: Physical Therapy

## 2016-12-22 ENCOUNTER — Encounter: Payer: Self-pay | Admitting: Physical Therapy

## 2016-12-22 DIAGNOSIS — M62830 Muscle spasm of back: Secondary | ICD-10-CM

## 2016-12-22 DIAGNOSIS — M6281 Muscle weakness (generalized): Secondary | ICD-10-CM

## 2016-12-22 DIAGNOSIS — M5441 Lumbago with sciatica, right side: Secondary | ICD-10-CM

## 2016-12-22 NOTE — Therapy (Signed)
[facility] [address] [place], [place], [address] Phone: [phone]   Fax:  [phone]  Physical Therapy Treatment  Patient Details  Name: Summer Nielsen MRN: [id number] Date of Birth: [birth year] Referring Provider: Dr. Magnus Sinning  Encounter Date: 12/22/2016      PT End of Session - 12/22/16 1059    Visit Number 12   Number of Visits 20   Date for PT Re-Evaluation 12/31/16   Authorization Type [employer]HC medicare g-code on 10th visit; kx modifier on 15th   PT Start Time 1051   PT Stop Time 1135   PT Time Calculation (min) 44 min   Activity Tolerance Patient tolerated treatment well;No increased pain   Behavior During Therapy WFL for tasks assessed/performed      Past Medical History:  Diagnosis Date  . CHF (congestive heart failure) ([place])   . Glaucoma   . High blood pressure   . Hyperlipidemia   . Hypothyroidism   . OP (osteoporosis)   . Postsurgical hypothyroidism   . 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 Tachycardia   . TIA (transient ischemic attack)     History reviewed. No pertinent surgical history.  There were no vitals filed for this visit.      Subjective Assessment - 12/22/16 1101    Subjective I feel really good this AM. 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   Currently in Pain? Yes   Pain Score 2    Pain Location Back   Pain Orientation Lower;Right   Pain Descriptors / Indicators Dull;Aching   Aggravating Factors  Doing too much in standing   Pain Relieving Factors Sitting, heat   Multiple Pain Sites No                         [facility] Adult PT Treatment/Exercise - 12/22/16 0001      Lumbar Exercises: Stretches   Lower Trunk Rotation --  10x side to side slowly     Lumbar Exercises: Aerobic   Stationary Bike Nustep L2 x 10 min     Lumbar Exercises: Standing   Other Standing Lumbar Exercises Standing on black pad: cone stack to  first shelf 1 min 2x with seated rest inbetween    Other Standing Lumbar Exercises Walking around the clinic 2 laps with cane, then seaetd rest, pt was able to repeat  VC to think about posture and contracting lower abs slightly     Lumbar Exercises: Seated   Long Arc Quad on Chair Strengthening;Both;2 sets;10 reps;Weights   LAQ on Chair Weights (lbs) 2   Other Seated Lumbar Exercises trunk rotation 2x 5 VC to stayed lifted and not slouch     Lumbar Exercises: Supine   Bridge 10 reps   Other Supine Lumbar Exercises BUE press into beach ball during exhale x20 reps                   PT Short Term Goals - 11/26/16 1103      PT SHORT TERM GOAL #1   Title independent with initial HEP   Time 4   Period Weeks   Status Achieved     PT SHORT TERM GOAL #2   Title walk with pain in right leg decreased >/= 25%   Time 4   Period Weeks   Status Achieved     PT SHORT TERM GOAL #3   Title turn in bed with  pain decreased >/= 25%   Time 4   Period Weeks   Status Achieved           PT Long Term Goals - 11/29/16 0945      PT LONG TERM GOAL #1   Title independent with HEP   Baseline still learning   Time 8   Period Weeks   Status On-going     PT LONG TERM GOAL #2   Title turn in bed with pain in lumbar decreased >/= 75%    Baseline 30% less pain   Time 8   Period Weeks   Status On-going     PT LONG TERM GOAL #3   Title pain with walking in right leg decreased >/= 75% due to improved strength and improve muscle mobility   Baseline no pain in right leg   Time 8   Period Weeks   Status Achieved     PT LONG TERM GOAL #4   Title FOTO score </= 41% limitation   Time 8   Period Weeks   Status New               Plan - 12/22/16 1100    Clinical Impression Statement Pt's arms were still sore from last session so we challenged her core strength in different ways: we tried to work more in standing as this postion is still most difficult for her. At the end of  the session her low back right was some tighter but no terrible. She reported  she would go home and put heat on it.    Rehab Potential Excellent   Clinical Impairments Affecting Rehab Potential 3 hip replacements on the left; osteoporosis; CHF; glaucoma; skin cancer   PT Frequency 2x / week   PT Duration 8 weeks   PT Treatment/Interventions Moist Heat;Therapeutic activities;Therapeutic exercise;Neuromuscular re-education;Manual techniques;Dry needling;Patient/family education   PT Next Visit Plan progress Lumbar mobliity and strength in more upright positions; manual techniques as needed for muscle spasm/pain control.    Consulted and Agree with Plan of Care Patient      Patient will benefit from skilled therapeutic intervention in order to improve the following deficits and impairments:  Pain, Decreased strength, Decreased activity tolerance, Increased fascial restricitons, Increased muscle spasms  Visit Diagnosis: Muscle weakness (generalized)  Acute right-sided low back pain with right-sided sciatica  Muscle spasm of back     Problem List Patient Active Problem List   Diagnosis Date Noted  . Closed displaced fracture of base of fifth metacarpal bone of left hand 10/12/2016  . Lumbar radiculopathy 08/26/2016  . Iron deficiency anemia 05/20/2016  . Chronic right shoulder pain 02/16/2016  . Acute diastolic heart failure ([place]) 10/30/2015  . Secondary pulmonary hypertension 10/30/2015  . Hypertensive heart disease with heart failure ([place]) 10/30/2015  . TIA (transient ischemic attack)   . 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 Postsurgical hypothyroidism   . OP (osteoporosis)   . Hyperlipidemia   . High blood pressure   . Glaucoma   . CHF (congestive heart failure) ([place])     COCHRAN,JENNIFER, PTA 12/22/2016, 11:38 AM  [facility] [address] [place], [place], [address] Phone:  [phone]   Fax:  [phone]  Name: Summer Nielsen MRN: [id number] Date of Birth: [birth year]

## 2016-12-24 ENCOUNTER — Encounter: Payer: Self-pay | Admitting: Rehabilitation

## 2016-12-24 ENCOUNTER — Ambulatory Visit: Payer: Medicare Other | Admitting: Rehabilitation

## 2016-12-24 DIAGNOSIS — M6281 Muscle weakness (generalized): Secondary | ICD-10-CM | POA: Diagnosis not present

## 2016-12-24 DIAGNOSIS — M62830 Muscle spasm of back: Secondary | ICD-10-CM

## 2016-12-24 DIAGNOSIS — M5441 Lumbago with sciatica, right side: Secondary | ICD-10-CM

## 2016-12-24 NOTE — Therapy (Signed)
[facility] [address] [place], [place], [address] Phone: [phone]   Fax:  [phone]  Physical Therapy Treatment  Patient Details  Name: Summer Nielsen MRN: [id number] Date of Birth: 16-Apr-1927 Referring Provider: Dr. Magnus Sinning  Encounter Date: 12/24/2016      PT End of Session - 12/24/16 1057    Visit Number 13   Number of Visits 20   Date for PT Re-Evaluation 12/31/16   Authorization Type UHC medicare g-code on 10th visit; kx modifier on 15th   PT Start Time 1015   PT Stop Time 1110   PT Time Calculation (min) 55 min   Activity Tolerance Patient tolerated treatment well      Past Medical History:  Diagnosis Date  . CHF (congestive heart failure) ([place])   . Glaucoma   . High blood pressure   . Hyperlipidemia   . Hypothyroidism   . OP (osteoporosis)   . Postsurgical hypothyroidism   . 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 Tachycardia   . TIA (transient ischemic attack)     History reviewed. No pertinent surgical history.  There were no vitals filed for this visit.      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     Currently in Pain? Yes   Pain Score 4    Pain Location Back   Pain Orientation Lower;Right   Pain Descriptors / Indicators Aching   Pain Type Acute pain   Pain Onset More than a month ago   Pain Frequency Intermittent   Aggravating Factors  too much in standing, first thing in the morning   Pain Relieving Factors sitting, heat                         [facility] Adult PT Treatment/Exercise - 12/24/16 0001      Ambulation/Gait   Gait Comments gait around gym x 2 laps with [facility]     Lumbar Exercises: Stretches   Lower Trunk Rotation 10 seconds     Lumbar Exercises: Standing   Other Standing Lumbar Exercises standing on black foam pad cone reach onto 1st shelf x 10  cones one set with feet apart and 2 sets with feet together.    Other Standing Lumbar Exercises side steps at counter x 4 laps     Lumbar Exercises: Seated   Long Arc Quad on Chair Strengthening;Both;2 sets;10 reps;Weights   LAQ on Chair Weights (lbs) 2   Other Seated Lumbar Exercises trunk rotation 2x 5 VC to stayed lifted and not slouch     Lumbar Exercises: Supine   Bridge 10 reps  with ball between knees for positioning   Other Supine Lumbar Exercises BUE press into beach ball during exhale x20 reps      Modalities   Modalities Moist Heat     Moist Heat Therapy   Number Minutes Moist Heat 15 Minutes   Moist Heat Location Lumbar Spine                  PT Short Term Goals - 11/26/16 1103      PT SHORT TERM GOAL #1   Title independent with initial HEP   Time 4   Period Weeks   Status Achieved     PT SHORT TERM GOAL #2   Title walk with pain in right leg decreased >/=  25%   Time 4   Period Weeks   Status Achieved     PT SHORT TERM GOAL #3   Title turn in bed with pain decreased >/= 25%   Time 4   Period Weeks   Status Achieved           PT Long Term Goals - 12/24/16 1059      PT LONG TERM GOAL #1   Title independent with HEP   Status On-going     PT LONG TERM GOAL #2   Title turn in bed with pain in lumbar decreased >/= 75%    Status On-going     PT LONG TERM GOAL #3   Title pain with walking in right leg decreased >/= 75% due to improved strength and improve muscle mobility   Status On-going     PT LONG TERM GOAL #4   Title FOTO score </= 41% limitation   Status On-going               Plan - 12/24/16 1057    Clinical Impression Statement Continued core strengthening and mobility in supine, seated, and standing.  Tolerated all well with some pain with bridging but only the first 2 times.  Ended with heat again today due to some soreness   Rehab Potential Excellent   Clinical Impairments Affecting Rehab Potential 3 hip replacements  on the left; osteoporosis; CHF; glaucoma; skin cancer   PT Frequency 2x / week   PT Duration 8 weeks   PT Treatment/Interventions Moist Heat;Therapeutic activities;Therapeutic exercise;Neuromuscular re-education;Manual techniques;Dry needling;Patient/family education   PT Next Visit Plan progress Lumbar mobliity and strength in more upright positions; manual techniques as needed for muscle spasm/pain control.    PT Home Exercise Plan progress as needed   Consulted and Agree with Plan of Care Patient      Patient will benefit from skilled therapeutic intervention in order to improve the following deficits and impairments:  Pain, Decreased strength, Decreased activity tolerance, Increased fascial restricitons, Increased muscle spasms  Visit Diagnosis: Muscle weakness (generalized)  Acute right-sided low back pain with right-sided sciatica  Muscle spasm of back     Problem List Patient Active Problem List   Diagnosis Date Noted  . Closed displaced fracture of base of fifth metacarpal bone of left hand 10/12/2016  . Lumbar radiculopathy 08/26/2016  . Iron deficiency anemia 05/20/2016  . Chronic right shoulder pain 02/16/2016  . Acute diastolic heart failure ([place]) 10/30/2015  . Secondary pulmonary hypertension 10/30/2015  . Hypertensive heart disease with heart failure ([place]) 10/30/2015  . TIA (transient ischemic attack)   . 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 Postsurgical hypothyroidism   . OP (osteoporosis)   . Hyperlipidemia   . High blood pressure   . Glaucoma   . CHF (congestive heart failure) ([place])     Brenson Hartman, Adrian Prince, DPT, CMP 12/24/2016, 11:00 AM  [facility] [address] [place], [place], [address] Phone: [phone]   Fax:  [phone]  Name: Shantelle Alles MRN: [id number] Date of Birth: 1927-07-20

## 2016-12-27 ENCOUNTER — Ambulatory Visit (INDEPENDENT_AMBULATORY_CARE_PROVIDER_SITE_OTHER): Payer: Medicare Other | Admitting: Specialist

## 2016-12-27 ENCOUNTER — Encounter (INDEPENDENT_AMBULATORY_CARE_PROVIDER_SITE_OTHER): Payer: Self-pay | Admitting: Specialist

## 2016-12-27 VITALS — BP 140/66 | HR 65 | Ht 62.0 in | Wt 144.0 lb

## 2016-12-27 DIAGNOSIS — M48062 Spinal stenosis, lumbar region with neurogenic claudication: Secondary | ICD-10-CM | POA: Diagnosis not present

## 2016-12-27 DIAGNOSIS — M5137 Other intervertebral disc degeneration, lumbosacral region: Secondary | ICD-10-CM

## 2016-12-27 DIAGNOSIS — M419 Scoliosis, unspecified: Secondary | ICD-10-CM

## 2016-12-27 DIAGNOSIS — M5136 Other intervertebral disc degeneration, lumbar region: Secondary | ICD-10-CM | POA: Diagnosis not present

## 2016-12-27 MED ORDER — ONDANSETRON 4 MG PO TBDP: ORAL_TABLET | ORAL | 2 refills | Status: DC

## 2016-12-27 MED ORDER — HYDROCODONE-ACETAMINOPHEN 5-325 MG PO TABS: 1.0000 | ORAL_TABLET | Freq: Four times a day (QID) | ORAL | 0 refills | Status: DC | PRN

## 2016-12-27 NOTE — Patient Instructions (Signed)
Avoid bending, stooping and avoid lifting weights greater than 10 lbs. Avoid prolong standing and walking. Order for a new walker with wheels. Surgery scheduling secretary Kandice Hams, will call you in the next week to schedule for surgery.  Surgery recommended is a one level lumbar fusion L5-S1 and right L4-5 partial hemilaminectomy this would be done with rods, screws and cage with local bone graft and allograft (donor bone graft) and right L4-5 partial hemilaminectomy  Take hydrocodone for for pain. Risk of surgery includes risk of infection 1 in 200 patients, bleeding 1/2% chance you would need a transfusion.   Risk to the nerves is one in 10,000. You will need to use a brace for 3 months and wean from the brace on the 4th month. Expect improved walking and standing tolerance. Expect relief of leg pain but numbness may persist depending on the length and degree of pressure that has been present.

## 2016-12-27 NOTE — Progress Notes (Addendum)
Office Visit Note   Patient: Summer Nielsen           Date of Birth: 26-Mar-1927           MRN: [id number] Visit Date: 12/27/2016              Requested by: Maurice Small, MD [place], [place] [address] PCP: Maurice Small, MD   Assessment & Plan: Visit Diagnoses:  1. Spinal stenosis of lumbar region with neurogenic claudication   2. Scoliosis of thoracolumbar spine, unspecified scoliosis type   3. Degenerative disc disease at L5-S1 level     Plan:Avoid bending, stooping and avoid lifting weights greater than 10 lbs. Avoid prolong standing and walking. Order for a new walker with wheels. Surgery scheduling secretary Kandice Hams, will call you in the next week to schedule for surgery.  Surgery recommended is a one level lumbar fusion L5-S1 and right L4-5 partial hemilaminectomy this would be done with rods, screws and cage with local bone graft and allograft (donor bone graft) and right L4-5 partial hemilaminectomy  Take hydrocodone for for pain. Risk of surgery includes risk of infection 1 in 200 patients, bleeding 1/2% chance you would need a transfusion.   Risk to the nerves is one in 10,000. You will need to use a brace for 3 months and wean from the brace on the 4th month. Expect improved walking and standing tolerance. Expect relief of leg pain but numbness may persist depending on the length and degree of pressure that has been present.  Follow-Up Instructions: Return in about 4 weeks (around 01/24/2017) for post op.   Orders:  No orders of the defined types were placed in this encounter.  No orders of the defined types were placed in this encounter.     Procedures: No procedures performed   Clinical Data: Findings:  MRI of the lumbar spine with severe right L5 foramenal stenosis with reduced interpedicular space, assymetric collapse of the L5-S1 disc. She has a collapsing degenerative scoliosis Pattern with apex to the right at the  L3 level. There is central stenosis at the L4-5 level that is moderately severe right subarticular stenosis greater than left.    Subjective: Chief Complaint  Patient presents with  . Lower Back - Pain    Referred from Dr. Ernestina Patches for second opinion on her back.    83 year old female with thoracolumbar scoliosis and lumbar spinal stenosis at the lumbosacral junction. She has undergone attempts at treatment with a course of 3 ESIs transforamenal right L5. She reports that the ESIs only help for a couple of days then the pain recurrs. It is a sharp pain in the right upper buttock, worse with standing and walking. Pain improves with sitting but it is Beginning to be painful even at periods while she is sitting. She can only go for periods of an hour or so and then has to rest. No bladder incontinence, does has constipation, requests a medication for this. She finds that the tramadol is no longer helping with the pain. She tends to stoop with standing and has limitations of standing on the left leg due to previous pelvis fracture. The right leg is the source of her pain. Her husband has had previous lumbar decompression and a problem with chronic pain and has an indwelling spinal cord stimulator. She is able to sleep  After she get into a comfortable position but her pain worsens with rolling over in bed. She has  severe morning stiffness and difficulty straightening up in the morning. Pain is better though in a sitting position. She has been to physical therapy and tried ESIs. Had a recent bone density test ordered by Dr. Justin Mend, the study was done at [facility], she does not know the results of the test.     Review of Systems  Constitutional: Negative.   HENT: Negative.   Eyes: Negative.   Respiratory: Negative.   Cardiovascular: Negative.   Gastrointestinal: Negative.   Endocrine: Negative.   Genitourinary: Negative.   Musculoskeletal: Negative.   Skin: Negative.     Allergic/Immunologic: Negative.   Neurological: Negative.   Hematological: Negative.   Psychiatric/Behavioral: Negative.      Objective: Vital Signs: BP 140/66 (BP Location: Left Arm, Patient Position: Sitting)   Pulse 65   Ht _0  (1.575 m)   Wt 144 lb (65.3 kg)   LMP  (LMP Unknown)   BMI 26.34 kg/m   Physical Exam  Constitutional: She is oriented to person, place, and time. She appears well-developed and well-nourished.  HENT:  Head: Normocephalic and atraumatic.  Eyes: Pupils are equal, round, and reactive to light. EOM are normal.  Neck: Normal range of motion. Neck supple.  Pulmonary/Chest: Effort normal and breath sounds normal.  Abdominal: Soft. Bowel sounds are normal.  Neurological: She is alert and oriented to person, place, and time.  Skin: Skin is warm and dry.  Psychiatric: She has a normal mood and affect. Her behavior is normal. Judgment and thought content normal.    Back Exam   Tenderness  The patient is experiencing tenderness in the lumbar.  Range of Motion  Extension: abnormal  Flexion: normal  Lateral Bend Right: abnormal  Lateral Bend Left: abnormal  Rotation Right: abnormal  Rotation Left: abnormal   Muscle Strength  Right Quadriceps:  5/5  Left Quadriceps:  5/5  Right Hamstrings:  5/5  Left Hamstrings:  5/5   Tests  Straight leg raise right: negative Straight leg raise left: negative  Reflexes  Patellar: normal Achilles: normal Babinski's sign: normal   Other  Toe Walk: normal Heel Walk: normal Sensation: normal Gait: normal  Erythema: no back redness Scars: absent  Comments:  Right L5 ELH weak 4/5, hip abductors strength is normal.       Specialty Comments:  No specialty comments available.  Imaging: No results found.   PMFS History: Patient Active Problem List   Diagnosis Date Noted  . Closed displaced fracture of base of fifth metacarpal bone of left hand 10/12/2016  . Lumbar radiculopathy 08/26/2016  .  Iron deficiency anemia 05/20/2016  . Chronic right shoulder pain 02/16/2016  . Acute diastolic heart failure ([place]) 10/30/2015  . Secondary pulmonary hypertension 10/30/2015  . Hypertensive heart disease with heart failure ([place]) 10/30/2015  . TIA (transient ischemic attack)   . 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 Postsurgical hypothyroidism   . OP (osteoporosis)   . Hyperlipidemia   . High blood pressure   . Glaucoma   . CHF (congestive heart failure) ([facility])    Past Medical History:  Diagnosis Date  . CHF (congestive heart failure) ([place])   . Glaucoma   . High blood pressure   . Hyperlipidemia   . Hypothyroidism   . OP (osteoporosis)   . Postsurgical hypothyroidism   . 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 Tachycardia   . TIA (transient ischemic attack)     Family History  Problem Relation Age of Onset  . Heart attack Mother   . Cerebral aneurysm Mother   . Stroke Mother   . Hypertension Mother   . Heart attack Father   . Heart disease Sister   . Heart disease Brother     No past surgical history on file. Social History   Occupational History  . Not on file.   Social History Main Topics  . Smoking status: Never Smoker  . Smokeless tobacco: Never Used  . Alcohol use No  . Drug use: No  . Sexual activity: Not on file

## 2016-12-29 ENCOUNTER — Ambulatory Visit: Payer: Medicare Other | Admitting: Physical Therapy

## 2016-12-29 DIAGNOSIS — M6281 Muscle weakness (generalized): Secondary | ICD-10-CM

## 2016-12-29 DIAGNOSIS — M62830 Muscle spasm of back: Secondary | ICD-10-CM

## 2016-12-29 DIAGNOSIS — M5441 Lumbago with sciatica, right side: Secondary | ICD-10-CM

## 2016-12-29 NOTE — Therapy (Signed)
[facility] [address] [place], [place], [address] Phone: [phone]   Fax:  [phone]  Physical Therapy Treatment/Discharge  Patient Details  Name: Summer Nielsen MRN: [id number] Date of Birth: [birth year] Referring Provider: Dr. Magnus Sinning  Encounter Date: 12/29/2016      PT End of Session - 12/29/16 1332    Visit Number 14   Number of Visits 20   Date for PT Re-Evaluation 12/31/16   Authorization Type UHC medicare g-code on 10th visit; kx modifier on 15th   PT Start Time 1016   PT Stop Time 1058   PT Time Calculation (min) 42 min   Activity Tolerance Patient tolerated treatment well   Behavior During Therapy [facility] for tasks assessed/performed      Past Medical History:  Diagnosis Date  . CHF (congestive heart failure) ([place])   . Glaucoma   . High blood pressure   . Hyperlipidemia   . Hypothyroidism   . OP (osteoporosis)   . Postsurgical hypothyroidism   . 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 Tachycardia   . TIA (transient ischemic attack)     No past surgical history on file.  There were no vitals filed for this visit.      Subjective Assessment - 12/29/16 1022    Subjective Pt reports that things are not going well. She saw the surgeon on Monday who decided that she would likely need surgery. She thinks this will be within the next week or so. She continues to complete her exercises at home. She does feel that she is much stronger and is walking much better than she was prior to starting therapy, but the pain continues to be present.    Currently in Pain? Yes   Pain Score 8    Pain Location Back   Pain Orientation Right;Lower   Pain Descriptors / Indicators Aching   Pain Type Chronic pain   Pain Radiating Towards none    Pain Onset More than a month ago   Pain Frequency Intermittent   Aggravating Factors  too much standing and first thing in the  morning   Pain Relieving Factors sitting, heat            [facility] PT Assessment - 12/29/16 0001      Assessment   Medical Diagnosis M54.16 Lumbar radiculopaty; M51.16 radiculopathy due to lumbar intervertbral disc disorder   Onset Date/Surgical Date 09/06/16   Prior Therapy none     Precautions   Precautions Other (comment)   Precaution Comments osteoporosis; skin cancer; 3 hip left replacements     Restrictions   Weight Bearing Restrictions No     [place] residence   Living Arrangements Spouse/significant other   Type of [place]     Prior Function   Level of [place] Retired     Associate Professor   Overall Cognitive Status Within Functional Limits for tasks assessed     Observation/Other Assessments   Focus on Therapeutic Outcomes (FOTO)  56% limited  goal is 41% limitation     AROM   Overall AROM Comments Lumbar extension decreased by 25%                     [facility] Adult PT Treatment/Exercise - 12/29/16 0001      Lumbar Exercises: Seated   Long Arc Quad on Chair Both;2 sets;10 reps   LAQ on  Chair Limitations yellow TB   Other Seated Lumbar Exercises seated clams, red TB on Rt 2x10 reps, Lt without resistance (gentle pt resistance applied manually)      Lumbar Exercises: Supine   Ab Set 10 reps   AB Set Limitations x10 sec hold    Other Supine Lumbar Exercises abdominal bracing with bent knee fallout x15 reps each                PT Education - 12/29/16 1040    Education provided Yes   Education Details discussed PT POC considering upcoming surgery; importance of conitnuing to maintain trunk/LE strength up until the surgery; reviewed and updated HEP   Person(s) Educated Patient   Methods Explanation;Verbal cues;Tactile cues;Handout   Comprehension Verbalized understanding;Returned demonstration          PT Short Term Goals - 12/29/16 1030      PT SHORT TERM GOAL #1    Title independent with initial HEP   Time 4   Period Weeks   Status Achieved     PT SHORT TERM GOAL #2   Title walk with pain in right leg decreased >/= 25%   Time 4   Period Weeks   Status Achieved     PT SHORT TERM GOAL #3   Title turn in bed with pain decreased >/= 25%   Time 4   Period Weeks   Status Achieved           PT Long Term Goals - 12/29/16 1030      PT LONG TERM GOAL #1   Title independent with HEP   Status Achieved     PT LONG TERM GOAL #2   Title turn in bed with pain in lumbar decreased >/= 75%    Baseline "right much improvement" pt unable to provide percentage    Status Partially Met     PT LONG TERM GOAL #3   Title pain with walking in right leg decreased >/= 75% due to improved strength and improve muscle mobility   Baseline no pain in Rt leg    Status Achieved     PT LONG TERM GOAL #4   Title FOTO score </= 41% limitation   Status Not Met               Plan - 12/29/16 1332    Clinical Impression Statement Pt was discharged this session having met several of her short and long term goals. She also demonstrates some improvements in mobility and activity participation since the start of PT several weeks ago, evident by her ability to maintain more upright posture during ambulation. Pt reports improvements in her ability to walk and get around her home, as well as a resolution of her RLE pain. At this time, she does continue to have limitations in core/LE strength which is contributing to her decreased activity tolerance, and she appears to have reached a plateau in her progress. In addition, she reports that she may be undergoing surgery in the upcoming weeks to address her scoliosis. She demonstrates independence and dedication to her HEP, so she is agreeable with d/c from physical therapy to allow her to continue with this on her own until she undergoes surgery.    Rehab Potential Excellent   Clinical Impairments Affecting Rehab Potential 3  hip replacements on the left; osteoporosis; CHF; glaucoma; skin cancer   PT Frequency 2x / week   PT Duration 8 weeks   PT Treatment/Interventions Moist Heat;Therapeutic activities;Therapeutic exercise;Neuromuscular  re-education;Manual techniques;Dry needling;Patient/family education   PT Next Visit Plan progress Lumbar mobliity and strength in more upright positions; manual techniques as needed for muscle spasm/pain control.    PT Home Exercise Plan progress as needed   Consulted and Agree with Plan of Care Patient      Patient will benefit from skilled therapeutic intervention in order to improve the following deficits and impairments:  Pain, Decreased strength, Decreased activity tolerance, Increased fascial restricitons, Increased muscle spasms  Visit Diagnosis: Muscle weakness (generalized)  Muscle spasm of back  Acute right-sided low back pain with right-sided sciatica       G-Codes - 2017/01/26 1341    Functional Assessment Tool Used (Outpatient Only) FOTO: 56% limited    Functional Limitation Mobility: Walking and moving around   Mobility: Walking and Moving Around Goal Status [phone]) At least 40 percent but less than 60 percent impaired, limited or restricted   Mobility: Walking and Moving Around Discharge Status [phone]) At least 40 percent but less than 60 percent impaired, limited or restricted      Problem List Patient Active Problem List   Diagnosis Date Noted  . Closed displaced fracture of base of fifth metacarpal bone of left hand 10/12/2016  . Lumbar radiculopathy 08/26/2016  . Iron deficiency anemia 05/20/2016  . Chronic right shoulder pain 02/16/2016  . Acute diastolic heart failure ([place]) 10/30/2015  . Secondary pulmonary hypertension 10/30/2015  . Hypertensive heart disease with heart failure ([place]) 10/30/2015  . TIA (transient ischemic attack)   . 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 Postsurgical hypothyroidism   . OP  (osteoporosis)   . Hyperlipidemia   . High blood pressure   . Glaucoma   . CHF (congestive heart failure) ([place])    PHYSICAL THERAPY DISCHARGE SUMMARY  Visits from Start of Care: 14  Current functional level related to goals / functional outcomes: See above for more details    Remaining deficits: See above for more details    Education / Equipment: See above for more details   Plan: Patient agrees to discharge.  Patient goals were partially met. Patient is being discharged due to lack of progress.  ?????      1:44 PM,Jan 26, 2017 Elly Modena PT, [facility] [place]  [facility] [address] [place], [place], [address] Phone: [phone]   Fax:  [phone]  Name: Summer Nielsen MRN: [id number] Date of Birth: 20-Jan-1928

## 2016-12-29 NOTE — Patient Instructions (Addendum)
ABDOMINAL BRACING  While lying on your back, tighten your stomach muscles as you draw your navel down towards the floor.   10x10sec hold   KNEE FALL OUT   While lying on your back with both knees bent, stabilize your spine by bracing your abdominal muscles. Hold this contraction as you slowly lower one knee to the side. Your pelvis should not move.   You can place your thumbs on your pelvic bone to get feedback of any movements that occur. If your pelvis moves too much, then next time lower the leg less to maintain good control.  x15 reps each    Seated LAQ with band  Start sitting with band under arch of unaffected leg and around ankle of affected leg. Straighten your leg and slowly return to starting position keeping knees tight together.   x10 reps each, use yellow band    ELASTIC BAND - SEATED CLAMS  While sitting in a chair and an elastic band wrapped around your knees, move both knees to the sides to separate your legs. Keep contact of your feet on the floor the entire time.     Keep tummy tight and sit tall.  Red on the right, use hand to resist movement on the left  x15 reps   ELASTIC BAND ROWS   Holding elastic band with both hands, draw back the band as you bend your elbows. Keep your elbows near the side of your body.  x15 reps, use green band     [facility] [address], [place], [place] [address] Phone # [phone] Fax [phone]

## 2016-12-31 ENCOUNTER — Encounter: Payer: Medicare Other | Admitting: Physical Therapy

## 2017-01-07 ENCOUNTER — Telehealth (INDEPENDENT_AMBULATORY_CARE_PROVIDER_SITE_OTHER): Payer: Self-pay | Admitting: Specialist

## 2017-01-07 NOTE — Telephone Encounter (Signed)
I called and advised that we have to get clearance from her Drs before we can schedule surgery

## 2017-01-07 NOTE — Telephone Encounter (Signed)
Patient called left voicemail message to return call. Patient advised she has questions concerning her surgery. The number to contact patient is [phone]

## 2017-01-18 ENCOUNTER — Encounter: Payer: Self-pay | Admitting: Cardiology

## 2017-01-18 ENCOUNTER — Ambulatory Visit: Payer: Medicare Other | Admitting: Cardiology

## 2017-01-18 VITALS — BP 142/58 | HR 63 | Ht 62.0 in | Wt 142.8 lb

## 2017-01-18 DIAGNOSIS — Z79899 Other long term (current) drug therapy: Secondary | ICD-10-CM

## 2017-01-18 DIAGNOSIS — I10 Essential (primary) hypertension: Secondary | ICD-10-CM

## 2017-01-18 NOTE — Patient Instructions (Signed)
Medication Instructions:  The current medical regimen is effective;  continue present plan and medications.  Labwork: Please have blood work today (BMP)  Follow-Up: Follow up in 6 months with Cecilie Kicks, NP.  You will receive a letter in the mail 2 months before you are due.  Please call us when you receive this letter to schedule your follow up appointment.  Follow up in 1 year with Dr. Marlou Porch.  You will receive a letter in the mail 2 months before you are due.  Please call us when you receive this letter to schedule your follow up appointment.  If you need a refill on your cardiac medications before your next appointment, please call your pharmacy.  Thank you for choosing [facility]!!

## 2017-01-18 NOTE — Progress Notes (Signed)
Cardiology Office Note    Date:  01/18/2017   ID:  Pattie Flaharty, DOB 11-18-1927, MRN [id number]  PCP:  Maurice Small, MD  Cardiologist:   Candee Furbish, MD     History of Present Illness:  Summer Nielsen is a 83 y.o. female here for Follow-up of acute diastolic heart failure with elevated BNP, grade 2 diastolic dysfunction on echocardiogram, pulmonary infiltrate.  In July 2017, she began having more shortness of breath and was diagnosed originally with pneumonia. No fever, no chills. Treated with antibiotic course. She never improved that much. Dr. Justin Mend checked an echocardiogram which showed grade 2 diastolic dysfunction and BNP which was 200. She placed her on Lasix 40 mg once a day. Her lower extremity edema has improved. Her breathing is better but not completely resolved. Definitely improved.  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 They are now here with their grand children.  No squeezing CP up neck as she had previously transiently.    Past Medical History:  Diagnosis Date  . CHF (congestive heart failure) ([place])   . Glaucoma   . High blood pressure   . Hyperlipidemia   . Hypothyroidism   . OP (osteoporosis)   . Postsurgical hypothyroidism   . 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 Tachycardia   . TIA (transient ischemic attack)     No past surgical history on file.  Current Medications: Outpatient Medications Prior to Visit  Medication Sig Dispense Refill  . amLODipine (NORVASC) 5 MG tablet Take 5 mg by mouth daily.    Marland Kitchen atorvastatin (LIPITOR) 20 MG tablet Take 20 mg by mouth daily.    . baclofen (LIORESAL) 10 MG tablet Take 0.5 tablets (5 mg total) by mouth 3 (three) times daily as needed for muscle spasms. 30 each 2  . bimatoprost (LUMIGAN) 0.01 %  SOLN Place 1 drop into both eyes at bedtime.     . calcium-vitamin D (OSCAL WITH D) 500-200 MG-UNIT tablet Take 1 tablet by mouth 2 (two) times daily.    . furosemide (LASIX) 40 MG tablet Take 40 mg by mouth 2 (two) times daily.    Marland Kitchen gabapentin (NEURONTIN) 300 MG capsule Take 1 capsule (300 mg total) by mouth 3 (three) times daily. 270 capsule 3  . HYDROcodone-acetaminophen (NORCO/VICODIN) 5-325 MG tablet Take 1 tablet by mouth every 6 (six) hours as needed for moderate pain. 30 tablet 0  . Iron-FA-B Cmp-C-Biot-Probiotic (FUSION PLUS) CAPS Take 1 capsule by mouth daily.    Marland Kitchen levothyroxine (SYNTHROID, LEVOTHROID) 100 MCG tablet Take 100 mcg daily by mouth.  11  . losartan (COZAAR) 50 MG tablet Take 50 mg by mouth daily.    . meloxicam (MOBIC) 15 MG tablet Take 15 mg by mouth daily.    . Multiple Vitamins-Minerals (CENTRUM SILVER PO) Take 1 tablet by mouth daily.     . naproxen (NAPROSYN) 375 MG tablet Take 1 tablet (375 mg total) by mouth 2 (two) times daily. 6 tablet 0  . omeprazole (PRILOSEC) 20 MG capsule Take 20 mg by mouth daily at 12 noon.     . ondansetron (ZOFRAN-ODT) 4 MG disintegrating tablet 47m PO q 6 hrs as needed for nausea 30 tablet 2  . propranolol (INDERAL) 20 MG tablet Take 20 mg by  mouth 2 (two) times daily.     . traMADol (ULTRAM) 50 MG tablet TAKE 1 TABLET BY MOUTH EVERY 6 HOURS AS NEEDED 30 tablet 0  . levothyroxine (SYNTHROID, LEVOTHROID) 112 MCG tablet Take 112 mcg by mouth daily before breakfast.     No facility-administered medications prior to visit.      Allergies:   Levofloxacin and Percocet [oxycodone-acetaminophen]   Social History   Socioeconomic History  . Marital status: Married    Spouse name: None  . Number of children: None  . Years of education: None  . Highest education level: None  Social Needs  . Financial resource strain: None  . Food insecurity - worry: None  . Food insecurity - inability: None  . Transportation needs - medical: None  .  Transportation needs - non-medical: None  Occupational History  . None  Tobacco Use  . Smoking status: Never Smoker  . Smokeless tobacco: Never Used  Substance and Sexual Activity  . Alcohol use: No  . Drug use: No  . Sexual activity: None  Other Topics Concern  . None  Social History Narrative  . None     Family History:  The patient's father died of MI. 10's.    ROS:   Please see the history of present illness.    Review of Systems  All other systems reviewed and are negative.     PHYSICAL EXAM:   VS:  BP (!) 142/58   Pulse 63   Ht _0  (1.575 m)   Wt 142 lb 12.8 oz (64.8 kg)   LMP  (LMP Unknown)   BMI 26.12 kg/m    GEN: Well nourished, well developed, in no acute distress  HEENT: normal  Neck: no JVD, carotid bruits, or masses Cardiac: RRR; no murmurs, rubs, or gallops, minimal LE edema  Respiratory:  clear to auscultation bilaterally, normal work of breathing GI: soft, nontender, nondistended, + BS MS: no deformity or atrophy  Skin: warm and dry, no rash Neuro:  Alert and Oriented x 3, Strength and sensation are intact Psych: euthymic mood, full affect   Wt Readings from Last 3 Encounters:  01/18/17 142 lb 12.8 oz (64.8 kg)  12/27/16 144 lb (65.3 kg)  10/06/16 140 lb (63.5 kg)      Studies/Labs Reviewed:   EKG:  EKG 01/08/16-sinus bradycardia rate 58 with no other abnormalities. Personally viewed.  Recent Labs: 05/20/2016: ALT 13; B Natriuretic Peptide 427.1 05/27/2016: NT-Pro BNP 354 09/06/2016: BUN 32; Creatinine, Ser 1.24; Hemoglobin 11.1; Platelets 275; Potassium 3.6; Sodium 140   Lipid Panel No results found for: CHOL, TRIG, HDL, CHOLHDL, VLDL, LDLCALC, LDLDIRECT  Additional studies/ records that were reviewed today include:   ECHO 10/24/15: - Left ventricle: The cavity size was normal. Systolic function was   normal. The estimated ejection fraction was in the range of 60%   to 65%. Wall motion was normal; there were no regional wall   motion  abnormalities. Features are consistent with a pseudonormal   left ventricular filling pattern, with concomitant abnormal   relaxation and increased filling pressure (grade 2 diastolic   dysfunction). - Aortic valve: There was mild regurgitation. - Mitral valve: Calcified annulus. - Pulmonary arteries: Systolic pressure was mildly increased. PA   peak pressure: 34 mm Hg (S).  Prior lab work, office notes, EKG reviewed. Hemoglobin 11. Creatinine 0.8. BNP 200.   ASSESSMENT:    No diagnosis found.   PLAN:  In order of problems listed above:  Chronic  diastolic heart failure  - Continue with Lasix 40 mg twice a day.  - I agree with current treatment plan. She seems to be improving. Definitely has lost fluid weight within her lower extremities and for overall body weight is decreased a few pounds.  - Continue with daily weights, salt restriction, fluid restriction 1.5 L a day.  CKD 3  - 1.45  Secondary pulmonary hypertension  - Very mild, likely a result of her diastolic dysfunction. No change   Essential hypertension  - Improved with Lasix. Watch renal function. On amlodipine, losartan, propranolol. Checking BMET.   Bradycardia  - Improved since decreasing beta blocker dose. No syncope, doing well. Stable   Medication Adjustments/Labs and Tests Ordered: Current medicines are reviewed at length with the patient today.  Concerns regarding medicines are outlined above.  Medication changes, Labs and Tests ordered today are listed in the Patient Instructions below. There are no Patient Instructions on file for this visit.   Signed, Candee Furbish, MD  01/18/2017 1:36 PM    [facility] [address], [place], [place]  [address] Phone: [phone]; Fax: [phone]

## 2017-01-19 ENCOUNTER — Telehealth: Payer: Self-pay | Admitting: Cardiology

## 2017-01-19 LAB — BASIC METABOLIC PANEL
BUN/Creatinine Ratio: 18 (ref 12–28)
BUN: 21 mg/dL (ref 8–27)
CALCIUM: 9.1 mg/dL (ref 8.7–10.3)
CHLORIDE: 100 mmol/L (ref 96–106)
CO2: 26 mmol/L (ref 20–29)
Creatinine, Ser: 1.18 mg/dL — ABNORMAL HIGH (ref 0.57–1.00)
GFR calc non Af Amer: 42 mL/min/{1.73_m2} — ABNORMAL LOW (ref >59)
GFR, EST AFRICAN AMERICAN: 48 mL/min/{1.73_m2} — AB (ref >59)
Glucose: 90 mg/dL (ref 65–99)
Potassium: 4.6 mmol/L (ref 3.5–5.2)
Sodium: 143 mmol/L (ref 134–144)

## 2017-01-19 NOTE — Telephone Encounter (Signed)
New Message  Pt call to f/u up on surgical clearance faxed over from [facility]. Please call back to discuss

## 2017-01-20 NOTE — Telephone Encounter (Signed)
Follow Up Call:   Summer Nielsen is calling to inquire about a Pre-op Clearance from [facility] .   Please call

## 2017-01-20 NOTE — Telephone Encounter (Signed)
Please obtain details on surgery (date, MD, what surgery, etc.) so clearance can be properly sent to the correct MD/staff.

## 2017-01-21 NOTE — Telephone Encounter (Signed)
Pt seen by Dr Marlou Porch on 01/18/2017 Preop eval not addressed in the note, but she was stable from a cardiac standpoint. Have sent a text and sent a staff message to see if he feels she needs any further evaluation or testing prior to the surgery.  Rosaria Ferries, PA-C 01/21/2017 5:29 PM Beeper [phone]

## 2017-01-21 NOTE — Telephone Encounter (Signed)
Per Dr. Wandra Feinstein note on 12/27/16:  Surgery scheduling secretary Kandice Hams, will call you in the next week to schedule for surgery.   Surgery recommended is a one level lumbar fusion L5-S1 and right L4-5 partial hemilaminectomy this would be done with rods, screws and cage with local bone graft and allograft (donor bone graft) and right L4-5 partial hemilaminectomy   ** patient was seen by Dr. Marlou Porch on 01/18/17

## 2017-01-24 NOTE — Telephone Encounter (Signed)
Please advice regarding clearance. Reply to P CV DIV PREPO.

## 2017-01-25 NOTE — Telephone Encounter (Signed)
   Chart reviewed as part of pre-operative protocol coverage. Dr Marlou Porch was contacted regarding her since he had seen her 01/18/2017.  His reply: no further testing required prior to back surgery. She would be of intermediate risk based mainly on her age.    I will route this recommendation to the requesting party via Epic fax function and remove from pre-op pool.  Please call with questions.  Rosaria Ferries, PA-C 01/25/2017, 2:12 PM

## 2017-02-08 ENCOUNTER — Other Ambulatory Visit (HOSPITAL_BASED_OUTPATIENT_CLINIC_OR_DEPARTMENT_OTHER): Payer: Medicare Other

## 2017-02-08 DIAGNOSIS — D509 Iron deficiency anemia, unspecified: Secondary | ICD-10-CM

## 2017-02-08 LAB — CBC WITH DIFFERENTIAL/PLATELET
BASO%: 0.6 % (ref 0.0–2.0)
BASOS ABS: 0.1 10*3/uL (ref 0.0–0.1)
EOS%: 4.9 % (ref 0.0–7.0)
Eosinophils Absolute: 0.4 10*3/uL (ref 0.0–0.5)
HCT: 34.8 % (ref 34.8–46.6)
HEMOGLOBIN: 11.5 g/dL — AB (ref 11.6–15.9)
LYMPH%: 20.9 % (ref 14.0–49.7)
MCH: 31.2 pg (ref 25.1–34.0)
MCHC: 33 g/dL (ref 31.5–36.0)
MCV: 94.6 fL (ref 79.5–101.0)
MONO#: 0.8 10*3/uL (ref 0.1–0.9)
MONO%: 10.5 % (ref 0.0–14.0)
NEUT#: 5 10*3/uL (ref 1.5–6.5)
NEUT%: 63.1 % (ref 38.4–76.8)
Platelets: 303 10*3/uL (ref 145–400)
RBC: 3.68 10*6/uL — AB (ref 3.70–5.45)
RDW: 15.1 % — AB (ref 11.2–14.5)
WBC: 8 10*3/uL (ref 3.9–10.3)
lymph#: 1.7 10*3/uL (ref 0.9–3.3)

## 2017-02-08 LAB — IRON AND TIBC
%SAT: 32 % (ref 21–57)
Iron: 98 ug/dL (ref 41–142)
TIBC: 306 ug/dL (ref 236–444)
UIBC: 208 ug/dL (ref 120–384)

## 2017-02-08 LAB — FERRITIN: Ferritin: 58 ng/ml (ref 9–269)

## 2017-02-09 ENCOUNTER — Ambulatory Visit: Payer: Medicare Other | Admitting: Hematology and Oncology

## 2017-02-09 DIAGNOSIS — D509 Iron deficiency anemia, unspecified: Secondary | ICD-10-CM | POA: Diagnosis not present

## 2017-02-09 NOTE — Assessment & Plan Note (Signed)
Chronic anemia since at least 20 years. Patient had blood transfusion 20 years ago and then 5 years ago and then most recently in March 2018 when she presented with hemoglobin of 6.9. She has seen Dr. Watt Climes with gastroenterology who performed upper endoscopy and did not find any cause of bleeding. She was started on fusion plus iron replacement therapy 06/06/2016. She is tolerating it extremely well. ------------------------------------------------------------ Blood work review: Hemoglobin 11.5, MCV 94.6, RDW 15.1 And ferritin 58, iron saturation 32%  Continuing to monitor her hemoglobin and iron studies. Return to clinic in 6 months for follow-up

## 2017-02-09 NOTE — Progress Notes (Signed)
Patient Care Team: Maurice Small, MD as PCP - General (Family Medicine)  DIAGNOSIS:  Encounter Diagnosis  Name Primary?  . Iron deficiency anemia, unspecified iron deficiency anemia type     CHIEF COMPLIANT: Follow-up of iron deficiency anemia  INTERVAL HISTORY: Summer Nielsen is a 83 year old with long-standing history of iron deficiency anemia for which she previously received blood transfusions and was sent to [place] for evaluation.  She was on oral iron therapy.  She had been tolerating oral iron extremely well.  She is here for recheck of her blood counts.  She denies any fatigue or shortness of breath with minimal exertion.  Denies any palpitations. She has been having a lot of back problems and is likely to need surgery to her spine in January.  REVIEW OF SYSTEMS:   Constitutional: Denies fevers, chills or abnormal weight loss Eyes: Denies blurriness of vision Ears, nose, mouth, throat, and face: Denies mucositis or sore throat Respiratory: Denies cough, dyspnea or wheezes Cardiovascular: Denies palpitation, chest discomfort Gastrointestinal:  Denies nausea, heartburn or change in bowel habits Skin: Denies abnormal skin rashes Lymphatics: Denies new lymphadenopathy or easy bruising Neurological: Chronic back pain with sciatica-like symptoms Behavioral/Psych: Mood is stable, no new changes  Extremities: No lower extremity edema  All other systems were reviewed with the patient and are negative.  I have reviewed the past medical history, past surgical history, social history and family history with the patient and they are unchanged from previous note.  ALLERGIES:  is allergic to levofloxacin and percocet [oxycodone-acetaminophen].  MEDICATIONS:  Current Outpatient Medications  Medication Sig Dispense Refill  . amLODipine (NORVASC) 5 MG tablet Take 5 mg by mouth daily.    Marland Kitchen atorvastatin (LIPITOR) 20 MG tablet Take 20 mg by mouth daily.    . baclofen (LIORESAL) 10 MG tablet  Take 0.5 tablets (5 mg total) by mouth 3 (three) times daily as needed for muscle spasms. 30 each 2  . bimatoprost (LUMIGAN) 0.01 % SOLN Place 1 drop into both eyes at bedtime.     . calcium-vitamin D (OSCAL WITH D) 500-200 MG-UNIT tablet Take 1 tablet by mouth 2 (two) times daily.    . furosemide (LASIX) 40 MG tablet Take 40 mg by mouth 2 (two) times daily.    Marland Kitchen gabapentin (NEURONTIN) 300 MG capsule Take 1 capsule (300 mg total) by mouth 3 (three) times daily. 270 capsule 3  . HYDROcodone-acetaminophen (NORCO/VICODIN) 5-325 MG tablet Take 1 tablet by mouth every 6 (six) hours as needed for moderate pain. 30 tablet 0  . Iron-FA-B Cmp-C-Biot-Probiotic (FUSION PLUS) CAPS Take 1 capsule by mouth daily.    Marland Kitchen levothyroxine (SYNTHROID, LEVOTHROID) 100 MCG tablet Take 100 mcg daily by mouth.  11  . losartan (COZAAR) 50 MG tablet Take 50 mg by mouth daily.    . meloxicam (MOBIC) 15 MG tablet Take 15 mg by mouth daily.    . Multiple Vitamins-Minerals (CENTRUM SILVER PO) Take 1 tablet by mouth daily.     . naproxen (NAPROSYN) 375 MG tablet Take 1 tablet (375 mg total) by mouth 2 (two) times daily. 6 tablet 0  . omeprazole (PRILOSEC) 20 MG capsule Take 20 mg by mouth daily at 12 noon.     . ondansetron (ZOFRAN-ODT) 4 MG disintegrating tablet 34m PO q 6 hrs as needed for nausea 30 tablet 2  . propranolol (INDERAL) 20 MG tablet Take 20 mg by mouth 2 (two) times daily.     . traMADol (ULTRAM) 50 MG tablet  TAKE 1 TABLET BY MOUTH EVERY 6 HOURS AS NEEDED 30 tablet 0   No current facility-administered medications for this visit.     PHYSICAL EXAMINATION: ECOG PERFORMANCE STATUS: 1 - Symptomatic but completely ambulatory  Vitals:   02/09/17 1409  BP: (!) 129/58  Pulse: 63  Resp: 15  Temp: 97.8 F (36.6 C)  SpO2: 97%   Filed Weights   02/09/17 1409  Weight: 142 lb 3.2 oz (64.5 kg)    GENERAL:alert, no distress and comfortable SKIN: skin color, texture, turgor are normal, no rashes or significant  lesions EYES: normal, Conjunctiva are pink and non-injected, sclera clear OROPHARYNX:no exudate, no erythema and lips, buccal mucosa, and tongue normal  NECK: supple, thyroid normal size, non-tender, without nodularity LYMPH:  no palpable lymphadenopathy in the cervical, axillary or inguinal LUNGS: clear to auscultation and percussion with normal breathing effort HEART: regular rate & rhythm and no murmurs and no lower extremity edema ABDOMEN:abdomen soft, non-tender and normal bowel sounds MUSCULOSKELETAL:no cyanosis of digits and no clubbing  NEURO: alert & oriented x 3 with fluent speech, no focal motor/sensory deficits EXTREMITIES: No lower extremity edema  LABORATORY DATA:  I have reviewed the data as listed   Chemistry      Component Value Date/Time   NA 143 01/18/2017 1352   K 4.6 01/18/2017 1352   CL 100 01/18/2017 1352   CO2 26 01/18/2017 1352   BUN 21 01/18/2017 1352   CREATININE 1.18 (H) 01/18/2017 1352      Component Value Date/Time   CALCIUM 9.1 01/18/2017 1352   ALKPHOS 36 (L) 05/20/2016 0722   AST 20 05/20/2016 0722   ALT 13 (L) 05/20/2016 0722   BILITOT 0.4 05/20/2016 0722       Lab Results  Component Value Date   WBC 8.0 02/08/2017   HGB 11.5 (L) 02/08/2017   HCT 34.8 02/08/2017   MCV 94.6 02/08/2017   PLT 303 02/08/2017   NEUTROABS 5.0 02/08/2017    ASSESSMENT & PLAN:  Iron deficiency anemia Chronic anemia since at least 20 years. Patient had blood transfusion 20 years ago and then 5 years ago and then most recently in March 2018 when she presented with hemoglobin of 6.9. She has seen Dr. Watt Climes with gastroenterology who performed upper endoscopy and did not find any cause of bleeding. She was started on fusion plus iron replacement therapy 06/06/2016. She is tolerating it extremely well. ------------------------------------------------------------ Blood work review: Hemoglobin 11.5, MCV 94.6, RDW 15.1 Ferritin 58, iron saturation 32%  Back pain:  Patient to be scheduled for spine surgery in January  Return to clinic on an as-needed basis  I spent 15 minutes talking to the patient of which more than half was spent in counseling and coordination of care.  No orders of the defined types were placed in this encounter.  The patient has a good understanding of the overall plan. she agrees with it. she will call with any problems that may develop before the next visit here.   Rulon Eisenmenger, MD 02/09/17

## 2017-02-16 ENCOUNTER — Other Ambulatory Visit (INDEPENDENT_AMBULATORY_CARE_PROVIDER_SITE_OTHER): Payer: Self-pay | Admitting: Specialist

## 2017-02-16 DIAGNOSIS — M5137 Other intervertebral disc degeneration, lumbosacral region: Secondary | ICD-10-CM

## 2017-02-16 DIAGNOSIS — M48062 Spinal stenosis, lumbar region with neurogenic claudication: Secondary | ICD-10-CM

## 2017-02-16 DIAGNOSIS — M51379 Other intervertebral disc degeneration, lumbosacral region without mention of lumbar back pain or lower extremity pain: Secondary | ICD-10-CM

## 2017-02-16 DIAGNOSIS — M419 Scoliosis, unspecified: Secondary | ICD-10-CM

## 2017-02-16 DIAGNOSIS — M5136 Other intervertebral disc degeneration, lumbar region: Secondary | ICD-10-CM

## 2017-02-16 NOTE — Telephone Encounter (Signed)
Patient requests refill on Hydrocodone and also Zofran because it makes her sick to take by itself. Her # is [phone]

## 2017-02-21 ENCOUNTER — Telehealth (INDEPENDENT_AMBULATORY_CARE_PROVIDER_SITE_OTHER): Payer: Self-pay | Admitting: Specialist

## 2017-02-21 NOTE — Telephone Encounter (Signed)
Patient needs refill of hydrocodone. Wants someone to call her back asap says she called about this last week.

## 2017-02-23 MED ORDER — HYDROCODONE-ACETAMINOPHEN 5-325 MG PO TABS: 1.0000 | ORAL_TABLET | Freq: Four times a day (QID) | ORAL | 0 refills | Status: DC | PRN

## 2017-02-23 MED ORDER — ONDANSETRON 4 MG PO TBDP: ORAL_TABLET | ORAL | 2 refills | Status: DC

## 2017-02-23 NOTE — Telephone Encounter (Signed)
IC patient and advised Rx ready for pickup, IC her earlier and LM advising, IC again and LM again for her

## 2017-02-23 NOTE — Telephone Encounter (Signed)
IC pt LMVM for her advising Rx at front for pickup

## 2017-03-08 HISTORY — PX: BACK SURGERY: SHX140

## 2017-03-10 ENCOUNTER — Telehealth (INDEPENDENT_AMBULATORY_CARE_PROVIDER_SITE_OTHER): Payer: Self-pay | Admitting: Physical Medicine and Rehabilitation

## 2017-03-10 NOTE — Telephone Encounter (Signed)
Patient left a message asking if she should continue her Gabapentin 300 mg three times a day. She said she is not really having the same leg pain at this time and she has scheduled surgery with Dr. Louanne Skye next week. Please advise.

## 2017-03-10 NOTE — Telephone Encounter (Signed)
Decrease to Twice a day, at night and in the morning and see if she can decrease the medication. The medication can be of benefit for pain that can be due to the surgery also so I think just a mild decrease but not totally stopping the medication is okay. jen

## 2017-03-10 NOTE — Telephone Encounter (Signed)
See messages below. Please advise patient.

## 2017-03-10 NOTE — Telephone Encounter (Signed)
I would not change anything prior to surgery without his knowledge, you could run by Nitka/Christy

## 2017-03-11 NOTE — Progress Notes (Addendum)
PCP: Maurice Small, MD  Cardiologist: Dr. Marlou Porch  EKG: 09/07/16 in EPIC  Stress test: 06/07/16 in EPIC  ECHO: 10/2015 and 05/20/16 in EPIC  Cardiac Cath: pt denies ever  Chest x-ray:05/20/16 in EPIC

## 2017-03-11 NOTE — Pre-Procedure Instructions (Signed)
Summer Nielsen  03/11/2017      [id number] [place], [place] - [id number]: [phone]: [phone]   Your procedure is scheduled on March 15, 2017.  [facility]  Call this number if you have problems the morning of surgery:  [phone]  Remember:  Do not eat food or drink liquids after midnight.  Take these medicines the morning of surgery with A SIP OF WATER propranolol (inderal), acetaminophen (tylenol), amlodipine (norvasc), levothyroxine (synthroid), ondansetron (zofran)-if needed for nausea, eye drops, hydrocodone-acetaminophen (norco)-if needed for pain,   Beginning now,STOP taking any Aspirin (unless otherwise instructed by your surgeon), Aleve, Naproxen, Ibuprofen, Motrin, Advil, Goody's, BC's, all herbal medications, fish oil, and all vitamins  Continue all other medications as instructed by your physician except follow the above medication instructions before surgery   Do not wear jewelry, make-up or nail polish.  Do not wear lotions, powders, or perfumes, or deodorant.  Do not shave 48 hours prior to surgery.    Do not bring valuables to the hospital.  [facility] or [facility].  Contacts, dentures or bridgework may not be worn into surgery.  Leave your suitcase in the car.  After surgery it may be brought to your room.  For patients admitted to the hospital, discharge time will be determined by your treatment team.  Patients discharged the day of surgery will not be allowed to drive home.   Special instructions:   [name]- Preparing For Surgery  Before surgery, you can play an important role. Because skin is not sterile, your skin needs to be as free of germs as possible. You can reduce the number of germs on your skin by washing with CHG (chlorahexidine gluconate) Soap  before surgery.  CHG is an antiseptic cleaner which kills germs and bonds with the skin to continue killing germs even after washing.  Please do not use if you have an allergy to CHG or antibacterial soaps. If your skin becomes reddened/irritated stop using the CHG.  Do not shave (including legs and underarms) for at least 48 hours prior to first CHG shower. It is OK to shave your face.  Please follow these instructions carefully.   1. Shower the NIGHT BEFORE SURGERY and the MORNING OF SURGERY with CHG.   2. If you chose to wash your hair, wash your hair first as usual with your normal shampoo.  3. After you shampoo, rinse your hair and body thoroughly to remove the shampoo.  4. Use CHG as you would any other liquid soap. You can apply CHG directly to the skin and wash gently with a scrungie or a clean washcloth.   5. Apply the CHG Soap to your body ONLY FROM THE NECK DOWN.  Do not use on open wounds or open sores. Avoid contact with your eyes, ears, mouth and genitals (private parts). Wash Face and genitals (private parts)  with your normal soap.  6. Wash thoroughly, paying special attention to the area where your surgery will be performed.  7. Thoroughly rinse your body with warm water from the neck down.  8. DO NOT shower/wash with your normal soap after using and rinsing off the CHG Soap.  9. Pat yourself dry with a CLEAN TOWEL.  10. Wear CLEAN PAJAMAS to bed the night before surgery, wear comfortable clothes the  morning of surgery  11. Place CLEAN SHEETS on your bed the night of your first shower and DO NOT SLEEP WITH PETS. 12.  Day of Surgery: Do not apply any deodorants/lotions. Please wear clean clothes to the hospital/surgery center.    Please read over the following fact sheets that you were given. Pain Booklet, Coughing and Deep Breathing and Surgical Site Infection Prevention

## 2017-03-11 NOTE — Telephone Encounter (Signed)
I called and she said that she is not having the nerve pain in her leg now.  I advised that per Dr. Louanne Skye she can decrease to 2 pills a day, 1 in the morning and 1 at night.  She states that she understands.

## 2017-03-14 ENCOUNTER — Encounter (HOSPITAL_COMMUNITY)
Admission: RE | Admit: 2017-03-14 | Discharge: 2017-03-14 | Disposition: A | Discharge status: Home / Self Care | Payer: Medicare Other | Source: Ambulatory Visit | Attending: Specialist | Admitting: Specialist

## 2017-03-14 ENCOUNTER — Other Ambulatory Visit: Payer: Self-pay

## 2017-03-14 ENCOUNTER — Encounter (HOSPITAL_COMMUNITY): Payer: Self-pay

## 2017-03-14 ENCOUNTER — Encounter (HOSPITAL_COMMUNITY): Payer: Self-pay | Admitting: Anesthesiology

## 2017-03-14 HISTORY — DX: Gastro-esophageal reflux disease without esophagitis: K21.9

## 2017-03-14 HISTORY — DX: Other specified postprocedural states: Z98.890

## 2017-03-14 HISTORY — DX: Other specified postprocedural states: R11.2

## 2017-03-14 HISTORY — DX: Malignant (primary) neoplasm, unspecified: C80.1

## 2017-03-14 HISTORY — DX: Adverse effect of unspecified anesthetic, initial encounter: T41.45XA

## 2017-03-14 HISTORY — DX: Other complications of anesthesia, initial encounter: T88.59XA

## 2017-03-14 LAB — URINALYSIS, ROUTINE W REFLEX MICROSCOPIC
Bilirubin Urine: NEGATIVE
Glucose, UA: NEGATIVE mg/dL
Hgb urine dipstick: NEGATIVE
Ketones, ur: NEGATIVE mg/dL
LEUKOCYTES UA: NEGATIVE
Nitrite: NEGATIVE
PROTEIN: NEGATIVE mg/dL
Specific Gravity, Urine: 1.009 (ref 1.005–1.030)
pH: 6 (ref 5.0–8.0)

## 2017-03-14 LAB — CBC
HEMATOCRIT: 36.9 % (ref 36.0–46.0)
Hemoglobin: 11.7 g/dL — ABNORMAL LOW (ref 12.0–15.0)
MCH: 30.6 pg (ref 26.0–34.0)
MCHC: 31.7 g/dL (ref 30.0–36.0)
MCV: 96.6 fL (ref 78.0–100.0)
Platelets: 329 10*3/uL (ref 150–400)
RBC: 3.82 MIL/uL — ABNORMAL LOW (ref 3.87–5.11)
RDW: 14.3 % (ref 11.5–15.5)
WBC: 8.1 10*3/uL (ref 4.0–10.5)

## 2017-03-14 LAB — COMPREHENSIVE METABOLIC PANEL
ALT: 14 U/L (ref 14–54)
ANION GAP: 8 (ref 5–15)
AST: 20 U/L (ref 15–41)
Albumin: 3.8 g/dL (ref 3.5–5.0)
Alkaline Phosphatase: 40 U/L (ref 38–126)
BUN: 22 mg/dL — ABNORMAL HIGH (ref 6–20)
CHLORIDE: 102 mmol/L (ref 101–111)
CO2: 28 mmol/L (ref 22–32)
Calcium: 9.5 mg/dL (ref 8.9–10.3)
Creatinine, Ser: 1.05 mg/dL — ABNORMAL HIGH (ref 0.44–1.00)
GFR calc Af Amer: 54 mL/min — ABNORMAL LOW (ref >60)
GFR calc non Af Amer: 46 mL/min — ABNORMAL LOW (ref >60)
Glucose, Bld: 91 mg/dL (ref 65–99)
POTASSIUM: 4.2 mmol/L (ref 3.5–5.1)
Sodium: 138 mmol/L (ref 135–145)
Total Bilirubin: 0.9 mg/dL (ref 0.3–1.2)
Total Protein: 7 g/dL (ref 6.5–8.1)

## 2017-03-14 LAB — APTT: APTT: 35 s (ref 24–36)

## 2017-03-14 LAB — SURGICAL PCR SCREEN
MRSA, PCR: NEGATIVE
Staphylococcus aureus: NEGATIVE

## 2017-03-14 LAB — PROTIME-INR
INR: 0.99
Prothrombin Time: 13 seconds (ref 11.4–15.2)

## 2017-03-14 MED ORDER — CHLORHEXIDINE GLUCONATE 4 % EX LIQD: 60.0000 mL | Freq: Once | CUTANEOUS | Status: DC

## 2017-03-14 NOTE — Progress Notes (Signed)
Anesthesia Chart Review: Patient is a 84 year old female scheduled for right L4-5 partial hemi-laminectomy, right L5-S1 transforaminal lumbar interbody fusion with pedicle screws with cement, rods, cages, local bone graft, Vivigen cancellous chips on 03/15/17 by Dr. [name].   History includes never smoker, post-operative N/V, diastolic CHF, thyroidectomy (s/p post-surgical hypothyroidism), osteoporosis, tachycardia, glaucoma, HTN, SCC excision (LLE), GERD, TIA, HLD, osteoporosis, hysterectomy, cholecystectomy, right TKA, left THA.  - PCP is Dr. Maurice Small. She cleared patient from a medical standpoint, but also recommended cardiac clearance. - Cardiologist is Dr. Candee Furbish. Last visit 01/18/17. According to 01/25/17 telephone encounter by Rosaria Ferries, PA-C, "Dr Marlou Porch was contacted regarding her since he had seen her 01/18/2017.  His reply: no further testing required prior to back surgery. She would be of intermediate risk based mainly on her age." - Hematologist is Dr. Nicholas Lose. Last office visit 02/09/17 for iron deficiency anemia follow-up. Notes indicate that patient had a prior EGD with Dr. Watt Climes which did not find any cause of bleeding.  Meds include amlodipine, Lipitor, Lumigan ophthalmic, Lasix, Neurontin, Norco, Fusion Plus, levothyroxine, losartan, Prilosec, Zofran, propanolol, tramadol.  BP (!) 155/50   Pulse 65   Temp 36.8 C   Resp 18   Ht 5' 2" (1.575 m)   Wt 144 lb 14.4 oz (65.7 kg)   LMP  (LMP Unknown)   SpO2 95%   BMI 26.50 kg/m    EKG 09/06/16: SR, PAC, minimal ST depression, lateral leads.  Nuclear stress test 06/07/16:   Nuclear stress EF: 71%.  No T wave inversion was noted during stress.  There was no ST segment deviation noted during stress.  This is a low risk study.  No reversible ischemia. LVEF 71% with normal wall motion. This is a low risk study.  Echo 05/20/16: Study Conclusions - Left ventricle: The cavity size was normal. Systolic function  was   normal. The estimated ejection fraction was in the range of 55%   to 60%. Wall motion was normal; there were no regional wall   motion abnormalities. There was a reduced contribution of atrial   contraction to ventricular filling, due to increased ventricular   diastolic pressure or atrial contractile dysfunction. Features   are consistent with a pseudonormal left ventricular filling   pattern, with concomitant abnormal relaxation and increased   filling pressure (grade 2 diastolic dysfunction). - Aortic valve: Trileaflet; mildly thickened, mildly calcified   leaflets. There was trivial regurgitation. - Mitral valve: Calcified annulus. Valve area by pressure   half-time: 1.45 cm^2. - Left atrium: The atrium was moderately dilated. - Pulmonary arteries: PA peak pressure: 35 mm Hg (S).  CTA chest 06/11/16: IMPRESSION: - No evidence of pulmonary emboli. - Subtle bibasilar peripheral increased interstitial markings and peripheral linear opacity suggesting chronic change atelectasis. Evidence of previous granulomas disease. - Mild cardiomegaly. Atherosclerotic coronary artery disease. Aortic atherosclerosis.  Preoperative labs noted.   If no acute changes then I anticipate that she can proceed as planned.  [facility] Short Stay Center/Anesthesiology Phone [phone] 03/14/2017 2:12 PM

## 2017-03-14 NOTE — Anesthesia Preprocedure Evaluation (Addendum)
Anesthesia Evaluation  Patient identified by MRN, date of birth, ID band Patient awake    Reviewed: Allergy & Precautions, NPO status , Patient's Chart, lab work & pertinent test results  History of Anesthesia Complications (+) PONV and history of anesthetic complications  Airway Mallampati: II  TM Distance: >3 FB Neck ROM: Full    Dental no notable dental hx. (+) Teeth Intact, Dental Advisory Given   Pulmonary neg pulmonary ROS,    Pulmonary exam normal breath sounds clear to auscultation       Cardiovascular hypertension, Pt. on medications and Pt. on home beta blockers +CHF  negative cardio ROS Normal cardiovascular exam Rhythm:Regular Rate:Normal     Neuro/Psych TIAnegative neurological ROS  negative psych ROS   GI/Hepatic negative GI ROS, Neg liver ROS, GERD  Medicated,  Endo/Other  negative endocrine ROSHypothyroidism   Renal/GU negative Renal ROS  negative genitourinary   Musculoskeletal negative musculoskeletal ROS (+) Arthritis , Osteoarthritis,    Abdominal   Peds negative pediatric ROS (+)  Hematology negative hematology ROS (+)   Anesthesia Other Findings EKG 09/06/16: SR, PAC, minimal ST depression, lateral leads.  Nuclear stress test 06/07/16:   Nuclear stress EF: 71%.  No T wave inversion was noted during stress.  There was no ST segment deviation noted during stress.  This is a low risk study. No reversible ischemia. LVEF 71% with normal wall motion. This is a low risk study.  Echo 05/20/16: Study Conclusions - Left ventricle: The cavity size was normal. Systolic function was normal. The estimated ejection fraction was in the range of 55% to 60%. Wall motion was normal; there were no regional wall motion abnormalities. There was a reduced contribution of atrial contraction to ventricular filling, due to increased ventricular diastolic pressure or atrial contractile dysfunction.  Features are consistent with a pseudonormal left ventricular filling pattern, with concomitant abnormal relaxation and increased filling pressure (grade 2 diastolic dysfunction). - Aortic valve: Trileaflet; mildly thickened, mildly calcified leaflets. There was trivial regurgitation. - Mitral valve: Calcified annulus. Valve area by pressure half-time: 1.45 cm^2. - Left atrium: The atrium was moderately dilated. - Pulmonary arteries: PA peak pressure: 35 mm Hg (S).    Reproductive/Obstetrics negative OB ROS                           Anesthesia Physical Anesthesia Plan  ASA: III  Anesthesia Plan: General   Post-op Pain Management:    Induction: Intravenous  PONV Risk Score and Plan: 3 and Ondansetron, Dexamethasone and Treatment may vary due to age or medical condition  Airway Management Planned: Oral ETT  Additional Equipment: Arterial line  Intra-op Plan:   Post-operative Plan: Extubation in OR  Informed Consent: I have reviewed the patients History and Physical, chart, labs and discussed the procedure including the risks, benefits and alternatives for the proposed anesthesia with the patient or authorized representative who has indicated his/her understanding and acceptance.     Plan Discussed with: Anesthesiologist and CRNA  Anesthesia Plan Comments: (  )        Anesthesia Quick Evaluation

## 2017-03-15 ENCOUNTER — Inpatient Hospital Stay (HOSPITAL_COMMUNITY)
Admission: RE | Admit: 2017-03-15 | Discharge: 2017-03-18 | DRG: 457 | Disposition: A | Discharge status: Skilled Nursing Facility | Payer: Medicare Other | Source: Ambulatory Visit | Attending: Specialist | Admitting: Specialist

## 2017-03-15 ENCOUNTER — Inpatient Hospital Stay (HOSPITAL_COMMUNITY): Payer: Medicare Other

## 2017-03-15 ENCOUNTER — Inpatient Hospital Stay (HOSPITAL_COMMUNITY): Payer: Medicare Other | Admitting: Vascular Surgery

## 2017-03-15 ENCOUNTER — Encounter (HOSPITAL_COMMUNITY)
Admission: RE | Disposition: A | Discharge status: Skilled Nursing Facility | Payer: Self-pay | Source: Ambulatory Visit | Attending: Specialist

## 2017-03-15 ENCOUNTER — Inpatient Hospital Stay (HOSPITAL_COMMUNITY): Payer: Medicare Other | Admitting: Anesthesiology

## 2017-03-15 ENCOUNTER — Encounter (HOSPITAL_COMMUNITY): Payer: Self-pay | Admitting: Anesthesiology

## 2017-03-15 DIAGNOSIS — M5137 Other intervertebral disc degeneration, lumbosacral region: Secondary | ICD-10-CM | POA: Diagnosis present

## 2017-03-15 DIAGNOSIS — Z85828 Personal history of other malignant neoplasm of skin: Secondary | ICD-10-CM | POA: Diagnosis not present

## 2017-03-15 DIAGNOSIS — K219 Gastro-esophageal reflux disease without esophagitis: Secondary | ICD-10-CM | POA: Diagnosis present

## 2017-03-15 DIAGNOSIS — D62 Acute posthemorrhagic anemia: Secondary | ICD-10-CM | POA: Diagnosis not present

## 2017-03-15 DIAGNOSIS — Z419 Encounter for procedure for purposes other than remedying health state, unspecified: Secondary | ICD-10-CM

## 2017-03-15 DIAGNOSIS — E890 Postprocedural hypothyroidism: Secondary | ICD-10-CM | POA: Diagnosis present

## 2017-03-15 DIAGNOSIS — Z96651 Presence of right artificial knee joint: Secondary | ICD-10-CM | POA: Diagnosis present

## 2017-03-15 DIAGNOSIS — E785 Hyperlipidemia, unspecified: Secondary | ICD-10-CM | POA: Diagnosis present

## 2017-03-15 DIAGNOSIS — M5116 Intervertebral disc disorders with radiculopathy, lumbar region: Secondary | ICD-10-CM | POA: Diagnosis present

## 2017-03-15 DIAGNOSIS — M48062 Spinal stenosis, lumbar region with neurogenic claudication: Secondary | ICD-10-CM | POA: Diagnosis not present

## 2017-03-15 DIAGNOSIS — Z981 Arthrodesis status: Secondary | ICD-10-CM

## 2017-03-15 DIAGNOSIS — H409 Unspecified glaucoma: Secondary | ICD-10-CM | POA: Diagnosis present

## 2017-03-15 DIAGNOSIS — M545 Low back pain: Secondary | ICD-10-CM | POA: Diagnosis present

## 2017-03-15 DIAGNOSIS — M5127 Other intervertebral disc displacement, lumbosacral region: Secondary | ICD-10-CM | POA: Diagnosis present

## 2017-03-15 DIAGNOSIS — M4156 Other secondary scoliosis, lumbar region: Secondary | ICD-10-CM | POA: Diagnosis present

## 2017-03-15 DIAGNOSIS — M48061 Spinal stenosis, lumbar region without neurogenic claudication: Secondary | ICD-10-CM | POA: Diagnosis present

## 2017-03-15 LAB — TYPE AND SCREEN
ABO/RH(D): A POS
ANTIBODY SCREEN: NEGATIVE

## 2017-03-15 SURGERY — POSTERIOR LUMBAR FUSION 1 LEVEL
Anesthesia: General

## 2017-03-15 MED ORDER — CEFAZOLIN SODIUM-DEXTROSE 2-4 GM/100ML-% IV SOLN
2.0000 g | INTRAVENOUS | Status: AC
  Administered 2017-03-15: 2 g via INTRAVENOUS
  Filled 2017-03-15: qty 100

## 2017-03-15 MED ORDER — SODIUM CHLORIDE 0.9 % IV SOLN
250.0000 mL | INTRAVENOUS | Status: DC
  Administered 2017-03-15: 21:00:00 via INTRAVENOUS

## 2017-03-15 MED ORDER — FENTANYL CITRATE (PF) 100 MCG/2ML IJ SOLN
INTRAMUSCULAR | Status: AC
  Filled 2017-03-15: qty 2

## 2017-03-15 MED ORDER — GLYCOPYRROLATE 0.2 MG/ML IJ SOLN
INTRAMUSCULAR | Status: DC | PRN
  Administered 2017-03-15: 0.2 mg via INTRAVENOUS

## 2017-03-15 MED ORDER — ACETAMINOPHEN 10 MG/ML IV SOLN
1000.0000 mg | INTRAVENOUS | Status: AC
  Administered 2017-03-15: 1000 mg via INTRAVENOUS
  Filled 2017-03-15: qty 100

## 2017-03-15 MED ORDER — B COMPLEX-C PO TABS
1.0000 | ORAL_TABLET | Freq: Every day | ORAL | Status: DC
  Administered 2017-03-15 – 2017-03-18 (×4): 1 via ORAL
  Filled 2017-03-15 (×4): qty 1

## 2017-03-15 MED ORDER — 0.9 % SODIUM CHLORIDE (POUR BTL) OPTIME
TOPICAL | Status: DC | PRN
  Administered 2017-03-15 (×2): 1000 mL

## 2017-03-15 MED ORDER — ONDANSETRON HCL 4 MG/2ML IJ SOLN
INTRAMUSCULAR | Status: AC
  Filled 2017-03-15: qty 2

## 2017-03-15 MED ORDER — KETOROLAC TROMETHAMINE 15 MG/ML IJ SOLN
7.5000 mg | Freq: Four times a day (QID) | INTRAMUSCULAR | Status: AC
  Administered 2017-03-15 – 2017-03-16 (×4): 7.5 mg via INTRAVENOUS
  Filled 2017-03-15 (×4): qty 1

## 2017-03-15 MED ORDER — FENTANYL CITRATE (PF) 250 MCG/5ML IJ SOLN
INTRAMUSCULAR | Status: AC
  Filled 2017-03-15: qty 5

## 2017-03-15 MED ORDER — EPHEDRINE SULFATE 50 MG/ML IJ SOLN
INTRAMUSCULAR | Status: DC | PRN
  Administered 2017-03-15 (×4): 5 mg via INTRAVENOUS

## 2017-03-15 MED ORDER — POLYSACCHARIDE IRON COMPLEX 150 MG PO CAPS
150.0000 mg | ORAL_CAPSULE | Freq: Every day | ORAL | Status: DC
  Administered 2017-03-15 – 2017-03-16 (×2): 150 mg via ORAL
  Filled 2017-03-15 (×3): qty 1

## 2017-03-15 MED ORDER — DEXAMETHASONE SODIUM PHOSPHATE 10 MG/ML IJ SOLN
INTRAMUSCULAR | Status: DC | PRN
  Administered 2017-03-15: 10 mg via INTRAVENOUS

## 2017-03-15 MED ORDER — MENTHOL 3 MG MT LOZG: 1.0000 | LOZENGE | OROMUCOSAL | Status: DC | PRN

## 2017-03-15 MED ORDER — HYDROCODONE-ACETAMINOPHEN 7.5-325 MG PO TABS: 2.0000 | ORAL_TABLET | ORAL | Status: DC | PRN

## 2017-03-15 MED ORDER — EPHEDRINE 5 MG/ML INJ
INTRAVENOUS | Status: AC
  Filled 2017-03-15: qty 10

## 2017-03-15 MED ORDER — ACETAMINOPHEN 325 MG PO TABS: 650.0000 mg | ORAL_TABLET | ORAL | Status: DC | PRN

## 2017-03-15 MED ORDER — ONDANSETRON HCL 4 MG PO TABS: 4.0000 mg | ORAL_TABLET | Freq: Four times a day (QID) | ORAL | Status: DC | PRN

## 2017-03-15 MED ORDER — SODIUM CHLORIDE 0.9 % IV SOLN
INTRAVENOUS | Status: DC
  Administered 2017-03-15: 21:00:00 via INTRAVENOUS

## 2017-03-15 MED ORDER — SUGAMMADEX SODIUM 200 MG/2ML IV SOLN
INTRAVENOUS | Status: DC | PRN
  Administered 2017-03-15: 150 mg via INTRAVENOUS

## 2017-03-15 MED ORDER — GABAPENTIN 300 MG PO CAPS
300.0000 mg | ORAL_CAPSULE | Freq: Three times a day (TID) | ORAL | Status: DC
  Administered 2017-03-15 – 2017-03-18 (×8): 300 mg via ORAL
  Filled 2017-03-15 (×8): qty 1

## 2017-03-15 MED ORDER — LEVOTHYROXINE SODIUM 100 MCG PO TABS
100.0000 ug | ORAL_TABLET | Freq: Every day | ORAL | Status: DC
  Administered 2017-03-16 – 2017-03-18 (×3): 100 ug via ORAL
  Filled 2017-03-15 (×3): qty 1

## 2017-03-15 MED ORDER — ACETAMINOPHEN 650 MG RE SUPP: 650.0000 mg | RECTAL | Status: DC | PRN

## 2017-03-15 MED ORDER — POLYETHYLENE GLYCOL 3350 17 G PO PACK
17.0000 g | PACK | Freq: Every day | ORAL | Status: DC | PRN
  Filled 2017-03-15: qty 1

## 2017-03-15 MED ORDER — BUPIVACAINE HCL 0.5 % IJ SOLN
INTRAMUSCULAR | Status: DC | PRN
  Administered 2017-03-15: 20 mL

## 2017-03-15 MED ORDER — DOCUSATE SODIUM 100 MG PO CAPS
100.0000 mg | ORAL_CAPSULE | Freq: Two times a day (BID) | ORAL | Status: DC
  Administered 2017-03-15 – 2017-03-18 (×6): 100 mg via ORAL
  Filled 2017-03-15 (×6): qty 1

## 2017-03-15 MED ORDER — FENTANYL CITRATE (PF) 100 MCG/2ML IJ SOLN
25.0000 ug | INTRAMUSCULAR | Status: DC | PRN
  Administered 2017-03-15 (×3): 25 ug via INTRAVENOUS

## 2017-03-15 MED ORDER — PANTOPRAZOLE SODIUM 40 MG PO TBEC
40.0000 mg | DELAYED_RELEASE_TABLET | Freq: Every day | ORAL | Status: DC
  Administered 2017-03-15 – 2017-03-18 (×4): 40 mg via ORAL
  Filled 2017-03-15 (×4): qty 1

## 2017-03-15 MED ORDER — SURGIFOAM 100 EX MISC
CUTANEOUS | Status: DC | PRN
  Administered 2017-03-15: 1 via TOPICAL

## 2017-03-15 MED ORDER — ROCURONIUM BROMIDE 100 MG/10ML IV SOLN
INTRAVENOUS | Status: DC | PRN
  Administered 2017-03-15: 20 mg via INTRAVENOUS
  Administered 2017-03-15: 40 mg via INTRAVENOUS

## 2017-03-15 MED ORDER — LATANOPROST 0.005 % OP SOLN
1.0000 [drp] | Freq: Every day | OPHTHALMIC | Status: DC
  Administered 2017-03-15 – 2017-03-17 (×3): 1 [drp] via OPHTHALMIC
  Filled 2017-03-15: qty 2.5

## 2017-03-15 MED ORDER — PROPOFOL 10 MG/ML IV BOLUS
INTRAVENOUS | Status: DC | PRN
  Administered 2017-03-15: 100 mg via INTRAVENOUS

## 2017-03-15 MED ORDER — LACTATED RINGERS IV SOLN
INTRAVENOUS | Status: DC | PRN
  Administered 2017-03-15 (×2): via INTRAVENOUS

## 2017-03-15 MED ORDER — CALCIUM CARBONATE-VITAMIN D 500-200 MG-UNIT PO TABS
1.0000 | ORAL_TABLET | Freq: Every day | ORAL | Status: DC
  Administered 2017-03-15 – 2017-03-18 (×4): 1 via ORAL
  Filled 2017-03-15 (×4): qty 1

## 2017-03-15 MED ORDER — FLEET ENEMA 7-19 GM/118ML RE ENEM: 1.0000 | ENEMA | Freq: Once | RECTAL | Status: DC | PRN

## 2017-03-15 MED ORDER — HYDROCODONE-ACETAMINOPHEN 5-325 MG PO TABS
1.0000 | ORAL_TABLET | ORAL | Status: DC | PRN
  Administered 2017-03-15: 1 via ORAL
  Filled 2017-03-15: qty 1

## 2017-03-15 MED ORDER — PROPOFOL 10 MG/ML IV BOLUS
INTRAVENOUS | Status: AC
  Filled 2017-03-15: qty 20

## 2017-03-15 MED ORDER — FOLIC ACID 1 MG PO TABS
1.0000 mg | ORAL_TABLET | Freq: Every day | ORAL | Status: DC
  Administered 2017-03-15 – 2017-03-18 (×4): 1 mg via ORAL
  Filled 2017-03-15 (×4): qty 1

## 2017-03-15 MED ORDER — ALBUMIN HUMAN 5 % IV SOLN
INTRAVENOUS | Status: DC | PRN
  Administered 2017-03-15: 11:00:00 via INTRAVENOUS

## 2017-03-15 MED ORDER — METHOCARBAMOL 500 MG PO TABS
500.0000 mg | ORAL_TABLET | Freq: Four times a day (QID) | ORAL | Status: DC | PRN
  Administered 2017-03-17: 500 mg via ORAL
  Filled 2017-03-15: qty 1

## 2017-03-15 MED ORDER — THROMBIN (RECOMBINANT) 20000 UNITS EX SOLR
CUTANEOUS | Status: AC
  Filled 2017-03-15: qty 20000

## 2017-03-15 MED ORDER — LOSARTAN POTASSIUM 50 MG PO TABS
50.0000 mg | ORAL_TABLET | Freq: Every day | ORAL | Status: DC
  Administered 2017-03-16 – 2017-03-18 (×2): 50 mg via ORAL
  Filled 2017-03-15 (×3): qty 1

## 2017-03-15 MED ORDER — BUPIVACAINE LIPOSOME 1.3 % IJ SUSP
INTRAMUSCULAR | Status: DC | PRN
  Administered 2017-03-15: 20 mL

## 2017-03-15 MED ORDER — FUSION PLUS PO CAPS: 1.0000 | ORAL_CAPSULE | Freq: Every day | ORAL | Status: DC

## 2017-03-15 MED ORDER — FENTANYL CITRATE (PF) 100 MCG/2ML IJ SOLN
INTRAMUSCULAR | Status: DC | PRN
  Administered 2017-03-15 (×5): 50 ug via INTRAVENOUS

## 2017-03-15 MED ORDER — ADULT MULTIVITAMIN W/MINERALS CH
1.0000 | ORAL_TABLET | Freq: Every day | ORAL | Status: DC
  Administered 2017-03-15 – 2017-03-18 (×4): 1 via ORAL
  Filled 2017-03-15 (×4): qty 1

## 2017-03-15 MED ORDER — AMLODIPINE BESYLATE 5 MG PO TABS
5.0000 mg | ORAL_TABLET | Freq: Every day | ORAL | Status: DC
  Administered 2017-03-16 – 2017-03-18 (×2): 5 mg via ORAL
  Filled 2017-03-15 (×3): qty 1

## 2017-03-15 MED ORDER — LIDOCAINE 2% (20 MG/ML) 5 ML SYRINGE
INTRAMUSCULAR | Status: AC
Start: 2017-03-15 — End: ?
  Filled 2017-03-15: qty 5

## 2017-03-15 MED ORDER — HEMOSTATIC AGENTS (NO CHARGE) OPTIME
TOPICAL | Status: DC | PRN
  Administered 2017-03-15 (×2): 1 via TOPICAL

## 2017-03-15 MED ORDER — PHENOL 1.4 % MT LIQD: 1.0000 | OROMUCOSAL | Status: DC | PRN

## 2017-03-15 MED ORDER — ATORVASTATIN CALCIUM 10 MG PO TABS
20.0000 mg | ORAL_TABLET | Freq: Every day | ORAL | Status: DC
  Administered 2017-03-15 – 2017-03-18 (×4): 20 mg via ORAL
  Filled 2017-03-15 (×5): qty 2

## 2017-03-15 MED ORDER — BUPIVACAINE LIPOSOME 1.3 % IJ SUSP
20.0000 mL | Freq: Once | INTRAMUSCULAR | Status: DC
  Filled 2017-03-15: qty 20

## 2017-03-15 MED ORDER — DEXTROSE 5 % IV SOLN: 500.0000 mg | Freq: Four times a day (QID) | INTRAVENOUS | Status: DC | PRN

## 2017-03-15 MED ORDER — CEFAZOLIN SODIUM-DEXTROSE 2-4 GM/100ML-% IV SOLN
2.0000 g | Freq: Three times a day (TID) | INTRAVENOUS | Status: AC
  Administered 2017-03-15 – 2017-03-16 (×2): 2 g via INTRAVENOUS
  Filled 2017-03-15 (×2): qty 100

## 2017-03-15 MED ORDER — MORPHINE SULFATE (PF) 4 MG/ML IV SOLN
1.0000 mg | INTRAVENOUS | Status: DC | PRN
  Administered 2017-03-17: 1 mg via INTRAVENOUS
  Filled 2017-03-15: qty 1

## 2017-03-15 MED ORDER — BISACODYL 5 MG PO TBEC
5.0000 mg | DELAYED_RELEASE_TABLET | Freq: Every day | ORAL | Status: DC | PRN
  Administered 2017-03-18: 5 mg via ORAL
  Filled 2017-03-15: qty 1

## 2017-03-15 MED ORDER — DEXAMETHASONE SODIUM PHOSPHATE 10 MG/ML IJ SOLN
INTRAMUSCULAR | Status: AC
  Filled 2017-03-15: qty 1

## 2017-03-15 MED ORDER — THROMBIN (RECOMBINANT) 20000 UNITS EX SOLR
CUTANEOUS | Status: DC | PRN
  Administered 2017-03-15 (×2): 20000 [IU] via TOPICAL

## 2017-03-15 MED ORDER — LIDOCAINE HCL (CARDIAC) 20 MG/ML IV SOLN
INTRAVENOUS | Status: DC | PRN
  Administered 2017-03-15: 80 mg via INTRAVENOUS

## 2017-03-15 MED ORDER — FUROSEMIDE 40 MG PO TABS
40.0000 mg | ORAL_TABLET | Freq: Two times a day (BID) | ORAL | Status: DC
  Administered 2017-03-15 – 2017-03-18 (×6): 40 mg via ORAL
  Filled 2017-03-15 (×6): qty 1

## 2017-03-15 MED ORDER — SODIUM CHLORIDE 0.9% FLUSH: 3.0000 mL | INTRAVENOUS | Status: DC | PRN

## 2017-03-15 MED ORDER — ALUM & MAG HYDROXIDE-SIMETH 200-200-20 MG/5ML PO SUSP: 30.0000 mL | Freq: Four times a day (QID) | ORAL | Status: DC | PRN

## 2017-03-15 MED ORDER — SUGAMMADEX SODIUM 200 MG/2ML IV SOLN
INTRAVENOUS | Status: AC
  Filled 2017-03-15: qty 2

## 2017-03-15 MED ORDER — PROPRANOLOL HCL 20 MG PO TABS
20.0000 mg | ORAL_TABLET | Freq: Two times a day (BID) | ORAL | Status: DC
  Administered 2017-03-16 – 2017-03-18 (×4): 20 mg via ORAL
  Filled 2017-03-15 (×6): qty 1

## 2017-03-15 MED ORDER — HYDROCODONE-ACETAMINOPHEN 7.5-325 MG PO TABS
1.0000 | ORAL_TABLET | Freq: Four times a day (QID) | ORAL | Status: DC
  Administered 2017-03-15 – 2017-03-18 (×12): 1 via ORAL
  Filled 2017-03-15 (×11): qty 1

## 2017-03-15 MED ORDER — ROCURONIUM BROMIDE 10 MG/ML (PF) SYRINGE
PREFILLED_SYRINGE | INTRAVENOUS | Status: AC
  Filled 2017-03-15: qty 5

## 2017-03-15 MED ORDER — ONDANSETRON HCL 4 MG/2ML IJ SOLN
4.0000 mg | Freq: Four times a day (QID) | INTRAMUSCULAR | Status: DC | PRN
  Administered 2017-03-15 – 2017-03-16 (×3): 4 mg via INTRAVENOUS
  Filled 2017-03-15 (×3): qty 2

## 2017-03-15 MED ORDER — BUPIVACAINE HCL (PF) 0.25 % IJ SOLN
INTRAMUSCULAR | Status: AC
  Filled 2017-03-15: qty 30

## 2017-03-15 MED ORDER — BUPIVACAINE HCL (PF) 0.5 % IJ SOLN
INTRAMUSCULAR | Status: AC
  Filled 2017-03-15: qty 30

## 2017-03-15 MED ORDER — MEPERIDINE HCL 25 MG/ML IJ SOLN: 6.2500 mg | INTRAMUSCULAR | Status: DC | PRN

## 2017-03-15 MED ORDER — SODIUM CHLORIDE 0.9% FLUSH
3.0000 mL | Freq: Two times a day (BID) | INTRAVENOUS | Status: DC
  Administered 2017-03-15 – 2017-03-17 (×5): 3 mL via INTRAVENOUS

## 2017-03-15 SURGICAL SUPPLY — 84 items
BENZOIN TINCTURE PRP APPL 2/3 (GAUZE/BANDAGES/DRESSINGS) ×3 IMPLANT
BLADE CLIPPER SURG (BLADE) IMPLANT
BONE CANC CHIPS 20CC PCAN1/4 (Bone Implant) ×3 IMPLANT
BONE MATRIX VIVIGEN 5CC (Bone Implant) ×3 IMPLANT
BUR MATCHSTICK NEURO 3.0 LAGG (BURR) ×3 IMPLANT
BUR SABER RD CUTTING 3.0 (BURR) IMPLANT
BUR SABER RD CUTTING 3.0MM (BURR)
CAGE CONCORDE LIFT 11X21 (Cage) ×2 IMPLANT
CAGE CONCORDE LIFT 11X21MM (Cage) ×1 IMPLANT
CANNULA FEN OPEN EXPEDIUM (MISCELLANEOUS) ×15 IMPLANT
CHIPS CANC BONE 20CC PCAN1/4 (Bone Implant) ×1 IMPLANT
CLOSURE STERI-STRIP 1/2X4 (GAUZE/BANDAGES/DRESSINGS) ×1
CLSR STERI-STRIP ANTIMIC 1/2X4 (GAUZE/BANDAGES/DRESSINGS) ×2 IMPLANT
COVER BACK TABLE 80X110 HD (DRAPES) ×3 IMPLANT
COVER SURGICAL LIGHT HANDLE (MISCELLANEOUS) ×3 IMPLANT
DECANTER SPIKE VIAL GLASS SM (MISCELLANEOUS) ×3 IMPLANT
DERMABOND ADHESIVE PROPEN (GAUZE/BANDAGES/DRESSINGS) ×2
DERMABOND ADVANCED (GAUZE/BANDAGES/DRESSINGS) ×2
DERMABOND ADVANCED .7 DNX12 (GAUZE/BANDAGES/DRESSINGS) ×1 IMPLANT
DERMABOND ADVANCED .7 DNX6 (GAUZE/BANDAGES/DRESSINGS) ×1 IMPLANT
DRAPE C-ARM 42X72 X-RAY (DRAPES) ×3 IMPLANT
DRAPE C-ARMOR (DRAPES) ×3 IMPLANT
DRAPE MICROSCOPE LEICA (MISCELLANEOUS) ×3 IMPLANT
DRAPE POUCH INSTRU U-SHP 10X18 (DRAPES) ×3 IMPLANT
DRAPE SURG 17X23 STRL (DRAPES) ×12 IMPLANT
DRSG MEPILEX BORDER 4X4 (GAUZE/BANDAGES/DRESSINGS) IMPLANT
DRSG MEPILEX BORDER 4X8 (GAUZE/BANDAGES/DRESSINGS) ×3 IMPLANT
DURAPREP 26ML APPLICATOR (WOUND CARE) ×3 IMPLANT
ELECT BLADE 4.0 EZ CLEAN MEGAD (MISCELLANEOUS) ×3
ELECT BLADE 6.5 EXT (BLADE) IMPLANT
ELECT CAUTERY BLADE 6.4 (BLADE) ×3 IMPLANT
ELECT REM PT RETURN 9FT ADLT (ELECTROSURGICAL) ×3
ELECTRODE BLDE 4.0 EZ CLN MEGD (MISCELLANEOUS) ×1 IMPLANT
ELECTRODE REM PT RTRN 9FT ADLT (ELECTROSURGICAL) ×1 IMPLANT
EVACUATOR 1/8 PVC DRAIN (DRAIN) IMPLANT
GAUZE SPONGE 4X4 12PLY STRL (GAUZE/BANDAGES/DRESSINGS) ×3 IMPLANT
GLOVE BIOGEL PI IND STRL 8 (GLOVE) ×1 IMPLANT
GLOVE BIOGEL PI INDICATOR 8 (GLOVE) ×2
GLOVE ECLIPSE 9.0 STRL (GLOVE) ×3 IMPLANT
GLOVE ORTHO TXT STRL SZ7.5 (GLOVE) ×3 IMPLANT
GLOVE SURG 8.5 LATEX PF (GLOVE) ×3 IMPLANT
GOWN STRL REUS W/ TWL LRG LVL3 (GOWN DISPOSABLE) ×1 IMPLANT
GOWN STRL REUS W/TWL 2XL LVL3 (GOWN DISPOSABLE) ×6 IMPLANT
GOWN STRL REUS W/TWL LRG LVL3 (GOWN DISPOSABLE) ×2
KIT BASIN OR (CUSTOM PROCEDURE TRAY) ×3 IMPLANT
KIT POSITION SURG JACKSON T1 (MISCELLANEOUS) ×3 IMPLANT
KIT ROOM TURNOVER OR (KITS) ×3 IMPLANT
NEEDLE 22X1 1/2 (OR ONLY) (NEEDLE) ×3 IMPLANT
NEEDLE SPNL 18GX3.5 QUINCKE PK (NEEDLE) ×3 IMPLANT
NS IRRIG 1000ML POUR BTL (IV SOLUTION) ×3 IMPLANT
PACK LAMINECTOMY ORTHO (CUSTOM PROCEDURE TRAY) ×3 IMPLANT
PAD ARMBOARD 7.5X6 YLW CONV (MISCELLANEOUS) ×6 IMPLANT
PATTIES SURGICAL .75X.75 (GAUZE/BANDAGES/DRESSINGS) ×3 IMPLANT
PATTIES SURGICAL 1X1 (DISPOSABLE) ×3 IMPLANT
ROD PRE BENT EXPEDIUM 35MM (Rod) ×3 IMPLANT
ROD PRE LORDOSED 5.5X45 (Rod) ×3 IMPLANT
SCREW CORT FIX FEN 5.5X7X35MM (Screw) ×6 IMPLANT
SCREW SET SINGLE INNER (Screw) ×12 IMPLANT
SCREW VIPER 7X45MM (Screw) ×3 IMPLANT
SCREW VIPER 7X50MM (Screw) ×3 IMPLANT
SPOGE SURGIFLO 8M (HEMOSTASIS) ×2
SPONGE LAP 4X18 X RAY DECT (DISPOSABLE) ×3 IMPLANT
SPONGE SURGIFLO 8M (HEMOSTASIS) ×1 IMPLANT
SPONGE SURGIFOAM ABS GEL 100 (HEMOSTASIS) IMPLANT
SUT VIC AB 0 CT1 27 (SUTURE) ×2
SUT VIC AB 0 CT1 27XBRD ANBCTR (SUTURE) ×1 IMPLANT
SUT VIC AB 1 CTX 36 (SUTURE) ×4
SUT VIC AB 1 CTX36XBRD ANBCTR (SUTURE) ×2 IMPLANT
SUT VIC AB 2-0 CT1 27 (SUTURE) ×2
SUT VIC AB 2-0 CT1 TAPERPNT 27 (SUTURE) ×1 IMPLANT
SUT VIC AB 3-0 X1 27 (SUTURE) ×3 IMPLANT
SYR 20CC LL (SYRINGE) ×3 IMPLANT
SYR CONTROL 10ML LL (SYRINGE) ×9 IMPLANT
SYS SPINAL CEMT CONFIDENCE 11C (Cement) ×3 IMPLANT
SYSTEM SPINAL CEMT CONFDNC 11C (Cement) ×1 IMPLANT
TAP CANN VIPER2 DL 5.0 (TAP) ×3 IMPLANT
TAP CANN VIPER2 DL 6.0 (TAP) ×3 IMPLANT
TAP CANN VIPER2 DL 7.0 (TAP) ×3 IMPLANT
TOWEL OR 17X24 6PK STRL BLUE (TOWEL DISPOSABLE) ×3 IMPLANT
TOWEL OR 17X26 10 PK STRL BLUE (TOWEL DISPOSABLE) ×3 IMPLANT
TRAY FOLEY CATH 14FR (SET/KITS/TRAYS/PACK) ×3 IMPLANT
TRAY FOLEY W/METER SILVER 16FR (SET/KITS/TRAYS/PACK) ×3 IMPLANT
WATER STERILE IRR 1000ML POUR (IV SOLUTION) ×3 IMPLANT
YANKAUER SUCT BULB TIP NO VENT (SUCTIONS) ×3 IMPLANT

## 2017-03-15 NOTE — Progress Notes (Signed)
Could not find blood bracelet in pt's chart. Another type and screen ordered.

## 2017-03-15 NOTE — H&P (Signed)
Summer Nielsen is an 84 y.o. female.   Chief Complaint: back pain and right LE radiculopathy HPI: patient with hx of L4-5 and L5-S1 stenosis and above complaint presents for surgical intervention.  Progressively worsening symptoms.  Failed conservative treatment.    Past Medical History:  Diagnosis Date  . Cancer (HCC)    squamous cell skin cancer on left leg-removed  . CHF (congestive heart failure) (HCC)   . Complication of anesthesia   . GERD (gastroesophageal reflux disease)   . Glaucoma   . High blood pressure   . Hyperlipidemia   . Hypothyroidism   . OP (osteoporosis)   . PONV (postoperative nausea and vomiting)   . Postsurgical hypothyroidism   . 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 Tachycardia   . TIA (transient ischemic attack)     Past Surgical History:  Procedure Laterality Date  . ABDOMINAL HYSTERECTOMY    . CHOLECYSTECTOMY    . EYE SURGERY     removed cateracts on both eyes  . JOINT REPLACEMENT     3 hip replacement on left, right knee replacement  . THYROIDECTOMY      Family History  Problem Relation Age of Onset  . Heart attack Mother   . Cerebral aneurysm Mother   . Stroke Mother   . Hypertension Mother   . Heart attack Father   . Heart disease Sister   . Heart disease Brother    Social History:  reports that  has never smoked. she has never used smokeless tobacco. She reports that she does not drink alcohol or use drugs.  Allergies:  Allergies  Allergen Reactions  . Levofloxacin Nausea Only    Dizzy, hot, foot pain, insomnia  . Percocet [Oxycodone-Acetaminophen] Nausea And Vomiting and Other (See Comments)    sick    Medications Prior to Admission  Medication Sig Dispense Refill  . acetaminophen (TYLENOL) 500 MG tablet Take 1,000 mg by mouth daily as needed for moderate pain or headache.    . amLODipine (NORVASC) 5 MG tablet Take 5 mg by mouth daily.    . atorvastatin (LIPITOR) 20 MG tablet Take 20 mg by  mouth daily.  3  . bimatoprost (LUMIGAN) 0.01 % SOLN Place 1 drop into both eyes at bedtime.     . calcium-vitamin D (OSCAL WITH D) 500-200 MG-UNIT tablet Take 1 tablet by mouth daily.     . furosemide (LASIX) 40 MG tablet Take 40 mg by mouth 2 (two) times daily.    . gabapentin (NEURONTIN) 300 MG capsule Take 1 capsule (300 mg total) by mouth 3 (three) times daily. 270 capsule 3  . HYDROcodone-acetaminophen (NORCO/VICODIN) 5-325 MG tablet Take 1 tablet by mouth every 6 (six) hours as needed for moderate pain. 30 tablet 0  . Iron-FA-B Cmp-C-Biot-Probiotic (FUSION PLUS) CAPS Take 1 capsule by mouth daily.    . levothyroxine (SYNTHROID, LEVOTHROID) 100 MCG tablet Take 100 mcg daily by mouth.  11  . losartan (COZAAR) 50 MG tablet Take 50 mg by mouth daily.    . Multiple Vitamins-Minerals (CENTRUM SILVER PO) Take 1 tablet by mouth daily.     . omeprazole (PRILOSEC) 20 MG capsule Take 20 mg by mouth daily.     . ondansetron (ZOFRAN-ODT) 4 MG disintegrating tablet 4mg PO q 6 hrs as needed for nausea 30 tablet 2  . propranolol (INDERAL) 20 MG tablet Take 20 mg by mouth 2 (two) times daily.     . naproxen (NAPROSYN) 375   MG tablet Take 1 tablet (375 mg total) by mouth 2 (two) times daily. (Patient not taking: Reported on 03/07/2017) 6 tablet 0  . traMADol (ULTRAM) 50 MG tablet TAKE 1 TABLET BY MOUTH EVERY 6 HOURS AS NEEDED (Patient not taking: Reported on 03/07/2017) 30 tablet 0    Results for orders placed or performed during the hospital encounter of 03/14/17 (from the past 48 hour(s))  Surgical pcr screen     Status: None   Collection Time: 03/14/17  9:58 AM  Result Value Ref Range   MRSA, PCR NEGATIVE NEGATIVE   Staphylococcus aureus NEGATIVE NEGATIVE    Comment: (NOTE) The Xpert SA Assay (FDA approved for NASAL specimens in patients 58 years of age and older), is one component of a comprehensive surveillance program. It is not intended to diagnose infection nor to guide or monitor treatment.    Urinalysis, Routine w reflex microscopic     Status: None   Collection Time: 03/14/17 10:11 AM  Result Value Ref Range   Color, Urine YELLOW YELLOW   APPearance CLEAR CLEAR   Specific Gravity, Urine 1.009 1.005 - 1.030   pH 6.0 5.0 - 8.0   Glucose, UA NEGATIVE NEGATIVE mg/dL   Hgb urine dipstick NEGATIVE NEGATIVE   Bilirubin Urine NEGATIVE NEGATIVE   Ketones, ur NEGATIVE NEGATIVE mg/dL   Protein, ur NEGATIVE NEGATIVE mg/dL   Nitrite NEGATIVE NEGATIVE   Leukocytes, UA NEGATIVE NEGATIVE  APTT     Status: None   Collection Time: 03/14/17 10:12 AM  Result Value Ref Range   aPTT 35 24 - 36 seconds  CBC     Status: Abnormal   Collection Time: 03/14/17 10:12 AM  Result Value Ref Range   WBC 8.1 4.0 - 10.5 K/uL   RBC 3.82 (L) 3.87 - 5.11 MIL/uL   Hemoglobin 11.7 (L) 12.0 - 15.0 g/dL   HCT 36.9 36.0 - 46.0 %   MCV 96.6 78.0 - 100.0 fL   MCH 30.6 26.0 - 34.0 pg   MCHC 31.7 30.0 - 36.0 g/dL   RDW 14.3 11.5 - 15.5 %   Platelets 329 150 - 400 K/uL  Comprehensive metabolic panel     Status: Abnormal   Collection Time: 03/14/17 10:12 AM  Result Value Ref Range   Sodium 138 135 - 145 mmol/L   Potassium 4.2 3.5 - 5.1 mmol/L   Chloride 102 101 - 111 mmol/L   CO2 28 22 - 32 mmol/L   Glucose, Bld 91 65 - 99 mg/dL   BUN 22 (H) 6 - 20 mg/dL   Creatinine, Ser 1.05 (H) 0.44 - 1.00 mg/dL   Calcium 9.5 8.9 - 10.3 mg/dL   Total Protein 7.0 6.5 - 8.1 g/dL   Albumin 3.8 3.5 - 5.0 g/dL   AST 20 15 - 41 U/L   ALT 14 14 - 54 U/L   Alkaline Phosphatase 40 38 - 126 U/L   Total Bilirubin 0.9 0.3 - 1.2 mg/dL   GFR calc non Af Amer 46 (L) >60 mL/min   GFR calc Af Amer 54 (L) >60 mL/min    Comment: (NOTE) The eGFR has been calculated using the CKD EPI equation. This calculation has not been validated in all clinical situations. eGFR's persistently <60 mL/min signify possible Chronic Kidney Disease.    Anion gap 8 5 - 15  Protime-INR     Status: None   Collection Time: 03/14/17 10:12 AM  Result  Value Ref Range   Prothrombin Time 13.0 11.4 -  15.2 seconds   INR 0.99   Type and screen [place]     Status: None   Collection Time: 03/14/17 10:30 AM  Result Value Ref Range   ABO/RH(D) A POS    Antibody Screen NEG    Sample Expiration 03/28/2017    Extend sample reason NO TRANSFUSIONS OR PREGNANCY IN THE PAST 3 MONTHS    No results found.  Review of Systems  Constitutional: Negative.   HENT: Negative.   Respiratory: Negative.   Cardiovascular: Negative.   Gastrointestinal: Negative.   Genitourinary: Negative.   Musculoskeletal: Positive for back pain.  Skin: Negative.   Neurological: Positive for tingling.  Psychiatric/Behavioral: Negative.     There were no vitals taken for this visit. Physical Exam  Constitutional: She is oriented to person, place, and time. No distress.  HENT:  Head: Normocephalic and atraumatic.  Eyes: Pupils are equal, round, and reactive to light.  Neck: Normal range of motion.  Respiratory: No respiratory distress.  GI: She exhibits no distension.  Musculoskeletal:  Back Exam   Tenderness  The patient is experiencing tenderness in the lumbar.  Range of Motion  Extension: abnormal  Flexion: normal  Lateral Bend Right: abnormal  Lateral Bend Left: abnormal  Rotation Right: abnormal  Rotation Left: abnormal   Muscle Strength  Right Quadriceps:  5/5  Left Quadriceps:  5/5  Right Hamstrings:  5/5  Left Hamstrings:  5/5   Tests  Straight leg raise right: negative Straight leg raise left: negative  Reflexes  Patellar: normal Achilles: normal Babinski's sign: normal   Other  Toe Walk: normal Heel Walk: normal Sensation: normal Gait: normal  Erythema: no back redness Scars: absent  Comments:  Right L5 ELH weak 4/5, hip abductors strength is normal.      Neurological: She is alert and oriented to person, place, and time.  Skin: Skin is warm and dry.  Psychiatric: She has a normal mood and  affect.     Assessment/Plan L4-5 and L5-S1 HNP/stenosis    We will proceed with RIGHT L4-5 PARTIAL HEMILAMINECTOMY, RIGHT L5-S1 TRANSFORAMINAL LUMBAR INTERBODY FUSION WITH PEDICLE SCREWS WITH CEMENT, RODS, CAGES, LOCAL BONE GRAFT, VIVIGEN CANCELLOUS CHIPS as scheduled.  Surgical procedure along with possible risks and complications discussed.  All questions answered and wishes to proceed.   Benjiman Core, PA-C 03/15/2017, 5:54 AM

## 2017-03-15 NOTE — Interval H&P Note (Signed)
History and Physical Interval Note:  03/15/2017 7:25 AM  Summer Nielsen  has presented today for surgery, with the diagnosis of collapsing lumbar scoliosis, central stenosis L4-5, right foraminal stenosis L5-S1  The various methods of treatment have been discussed with the patient and family. After consideration of risks, benefits and other options for treatment, the patient has consented to  Procedure(s): RIGHT L4-5 PARTIAL HEMILAMINECTOMY, RIGHT L5-S1 TRANSFORAMINAL LUMBAR INTERBODY FUSION WITH PEDICLE SCREWS WITH CEMENT, RODS, CAGES, LOCAL BONE GRAFT, VIVIGEN CANCELLOUS CHIPS (N/A) as a surgical intervention .  The patient's history has been reviewed, patient examined, no change in status, stable for surgery.  I have reviewed the patient's chart and labs.  Questions were answered to the patient's satisfaction.     [name]

## 2017-03-15 NOTE — Anesthesia Procedure Notes (Signed)
Procedure Name: Intubation Date/Time: 03/15/2017 7:48 AM Performed by: Jenne Campus, CRNA Pre-anesthesia Checklist: Patient identified, Emergency Drugs available, Suction available and Patient being monitored Patient Re-evaluated:Patient Re-evaluated prior to induction Oxygen Delivery Method: Circle System Utilized Preoxygenation: Pre-oxygenation with 100% oxygen Induction Type: IV induction Ventilation: Mask ventilation without difficulty Laryngoscope Size: Friedl and 2 Grade View: Grade I Tube type: Oral Tube size: 7.5 mm Number of attempts: 1 Airway Equipment and Method: Stylet and Oral airway Placement Confirmation: ETT inserted through vocal cords under direct vision,  positive ETCO2 and breath sounds checked- equal and bilateral Secured at: 22 cm Tube secured with: Tape Dental Injury: Teeth and Oropharynx as per pre-operative assessment

## 2017-03-15 NOTE — Anesthesia Postprocedure Evaluation (Signed)
Anesthesia Post Note  Patient: Laraina Sulton  Procedure(s) Performed: RIGHT L4-5 PARTIAL HEMILAMINECTOMY, RIGHT L5-S1 TRANSFORAMINAL LUMBAR INTERBODY FUSION WITH PEDICLE SCREWS WITH CEMENT, RODS, CAGES, LOCAL BONE GRAFT, VIVIGEN CANCELLOUS CHIPS (N/A )     Anesthesia Post Evaluation  Last Vitals:  Vitals:   03/15/17 0555 03/15/17 1324  BP: (!) 154/56   Pulse: (!) 57   Resp: 17   Temp: 36.6 C 36.6 C  SpO2: 94%     Last Pain:  Vitals:   03/15/17 1324  TempSrc:   PainSc: 0-No pain                 Shelbe Haglund

## 2017-03-15 NOTE — Anesthesia Procedure Notes (Signed)
Arterial Line Insertion Start/End1/10/2017 7:05 AM, 03/15/2017 7:15 AM Performed by: Jenne Campus, CRNA, CRNA  Patient location: Pre-op. Lidocaine 1% used for infiltration and patient sedated Left, radial was placed Catheter size: 20 G Hand hygiene performed  and maximum sterile barriers used   Attempts: 2 Procedure performed without using ultrasound guided technique. Following insertion, Biopatch and dressing applied. Post procedure assessment: normal  Patient tolerated the procedure well with no immediate complications.

## 2017-03-15 NOTE — Transfer of Care (Signed)
Immediate Anesthesia Transfer of Care Note  Patient: Summer Nielsen  Procedure(s) Performed: RIGHT L4-5 PARTIAL HEMILAMINECTOMY, RIGHT L5-S1 TRANSFORAMINAL LUMBAR INTERBODY FUSION WITH PEDICLE SCREWS WITH CEMENT, RODS, CAGES, LOCAL BONE GRAFT, VIVIGEN CANCELLOUS CHIPS (N/A )  Patient Location: PACU  Anesthesia Type:General  Level of Consciousness: awake, oriented and patient cooperative  Airway & Oxygen Therapy: Patient Spontanous Breathing and Patient connected to face mask oxygen  Post-op Assessment: Report given to RN and Post -op Vital signs reviewed and stable  Post vital signs: Reviewed  Last Vitals:  Vitals:   03/15/17 0555 03/15/17 1324  BP: (!) 154/56   Pulse: (!) 57   Resp: 17   Temp: 36.6 C (P) 36.6 C  SpO2: 94%     Last Pain:  Vitals:   03/15/17 0555  TempSrc: Oral      Patients Stated Pain Goal: 4 (34/06/84 0335)  Complications: No apparent anesthesia complications

## 2017-03-15 NOTE — Discharge Instructions (Addendum)
° ° °  Call if there is increasing drainage, fever greater than 101.5, severe head aches, and worsening nausea or light sensitivity. If shortness of breath, bloody cough or chest tightness or pain go to an emergency room. No lifting greater than 10 lbs. Avoid bending, stooping and twisting. Use brace when sitting and out of bed even to go to bathroom. Walk in house for first 2 weeks then may start to get out slowly increasing distances up to one mile by 4-6 weeks post op. After 5 days may shower and change dressing following bathing with shower.When bathing remove the brace shower and replace brace before getting out of the shower. If drainage, keep dry dressing and do not bathe the incision, use an moisture impervious dressing. Please call and return for scheduled follow up appointment 2 weeks from the time of surgery.

## 2017-03-15 NOTE — Op Note (Signed)
03/15/2017  1:57 PM  PATIENT:  Summer Nielsen  84 y.o. female  MRN: [id number]  OPERATIVE REPORT  PRE-OPERATIVE DIAGNOSIS:  foraminal stenosis L5-S1  POST-OPERATIVE DIAGNOSIS:  foraminal stenosis L5-S1  PROCEDURE:  Procedure(s): RIGHT L4-5 PARTIAL HEMILAMINECTOMY, RIGHT L5-S1 TRANSFORAMINAL LUMBAR INTERBODY FUSION WITH PEDICLE SCREWS WITH CEMENT, RODS, CAGES, LOCAL BONE GRAFT, VIVIGEN CANCELLOUS CHIPS    SURGEON:  Jessy Oto, MD     ASSISTANT:  Benjiman Core, PA-C  (Present throughout the entire procedure and necessary for completion of procedure in a timely manner)     ANESTHESIA:  General, jsupplemented with local anesthesia, marcaine 0.5% 1:1 exparel 1.3% total 30 cc. [employer], Immunologist. Dr. Glennon Mac.      COMPLICATIONS:  None.  EBL: 450CC     COMPONENTS  Implant Name Type Inv. Item Serial No. Manufacturer Lot No. LRB No. Used  SYS SPINAL CEMT CONFIDENCE 11C - [id number] Cement SYS SPINAL CEMT CONFIDENCE 11C [id number] [employer] SPINE [id number] N/A 1  CAGE CONCORDE LIFT 11X21MM - [id number] C Cage CAGE CONCORDE LIFT 11X21MM [id number] [facility] [employer] SPINE [id number] C N/A 1  SCREW VIPER 7X45MM - [id number] Screw SCREW VIPER 7X45MM [id number] [employer] [facility] SPINE [id number] N/A 1  SCREW CORT FIX FEN 5.5X7X35MM - [id number] Screw SCREW CORT FIX FEN 5.5X7X35MM [id number] [employer] [facility] DE[employer] SPINE [id number] N/A 2  SCREW VIPER 7X50MM - [id number] Screw SCREW VIPER 7X50MM [id number] [employer] [facility] DE[employer] SPINE [id number] N/A 1  ROD PRE BENT EXPEDIUM 35MM - [id number] Rod ROD PRE BENT EXPEDIUM 35MM [id number] [employer] [facility] DE[employer] SPINE [id number] N/A 1  SCREW SET SINGLE INNER - [id number] Screw SCREW SET SINGLE INNER [id number] [employer] [facility] DE[employer] SPINE [id number] N/A 4  SCREW VIPER 7X45MM - [id number] Screw SCREW VIPER 7X45MM [id number] [facility] [place] [id number] N/A 1  ROD PRE BENT EXPEDIUM 35MM - [id number] Rod ROD PRE BENT EXPEDIUM 35MM [id number] [facility] DE[employer] SPINE  [id number] N/A 1  ROD PRE LORDOSED 5.5X45 - [id number] Rod ROD PRE LORDOSED 5.5X45 [id number] [facility] [place] [id number] N/A 1  BONE [facility] CHIPS 20CC - [id number] Bone Implant BONE [facility] CHIPS 20CC [id number] [facility] [place]  N/A 1  BONE MATRIX VIVIGEN 5CC - [id number] Bone Implant BONE [employer] [place] [id number] [facility]  N/A 1  :   PROCEDURE: The patient was met in the holding area, and the appropriate lumbar levels right L4-5 and L5-S1 identified and marked with an "X" and my initials. I had discussion with the patient in the preop holding area regarding a change of consent form.The fusion levels are reidentified as L5-S1. Patient understands the rationale to perform TLIF at one level to decompress the right L4-5 and L5-S1 lateral recess and foramenal stenosis and to allow for some improved correction of her overall foramenal stenosis due to lumbar scoliosis and degenerative disc disease. The patient was then transported to OR and was placed under general anesthetic without difficulty. The patient received appropriate preoperative antibiotic prophylaxis 2 gm Ancef.  Nursing staff inserted a Foley catheter under sterile conditions. The patient was then turned to a prone position using the [place] spine frame. PAS. all pressure points well padded the arms at the side to 90 90. Standard prep with DuraPrep solution draped in the usual manner from the lower dorsal spine the mid sacral segment. Iodine Vi-Drape was used and the old incision scar was marked. Time-out procedure was called and correct. Skin in the midline between L4 to S2 was then infiltrated with local anesthesia,  marcaine 1/2% 1:1 exparel 1.3% total 30 cc used. Incision was then made  extending from L4-S2  through the skin and subcutaneous layers down to the patient's lumbodorsal fascia and spinous processes. The incision then carried sharply excising the supraspinous ligament and then continuing  the lateral aspect of the spinous processes of L4, L5 and S1. Cobb elevator used to carefully elevate the paralumbar muscles off of the posterior elements using electrocautery carefully drilled bleeding and perform dissection of the muscle tissues of the preserving the facet capsule at the L4-5. Continuing the exposure out laterally to expose the lateral margin of the facet joint line at L4-5 and L5-S1. Incision was carried in the midline down to the S1 level area bleeders controlled using electrocautery monopolar electrocautery.  Spinous processes of L4 and L5 marked with  OR marking pen sterilely Viper self retaining retractor was used for the upper part of the incision and the cerebellar retractor distally. C-arm fluoroscopy was then brought into the field and using C-arm fluoroscopy then a hole made into the medial aspect of the pedicle of L5 observed in the pedicle using C arm at the 5 oclock position on the left L4 pedicle nerve probe initial entry was determined on fluoroscopy to be good position alignment so that a 4.43m tap was passed to 30 mm within the left L5 pedicle to a depth of nearly 45 mm observed on C-arm fluoroscopy to be beyond the midpoint of the lumbar vertebra and then position alignment within the left L5 pedicle this was then removed and the pedicle channel probed demonstrating patency no sign of rupture the cortex of the pedicle. Tapping with a 4 mm screw tap then 5 mm tap then 6.0 mm and then 7.0 mm tap and then 7.0 mm x 45 mm screw was placed on the left side at the L5 level. C-arm fluoroscopy was then brought into the field and using C-arm fluoroscopy then a hole made into the posterior medial aspect of the pedicle of right L4 observed in the pedicle using ball tipped nerve hook and hockey stick nerve probe initial entry was determined on fluoroscopy to be good position alignment so that 4.057mtap was then used to tap the right L4 pedicle to a depth of nearly 45 mm observed on C-arm  fluoroscopy to be beyond the midpoint of the lumbar vertebra and then position alignment within the right L5 pedicle this was then removed and the pedicle channel probed demonstrating patency no sign of rupture the cortex of the pedicle. Tapping with a 5 mm screw tap, then a 6.0 mm tap and then a 7.0 mm tap and  then 7.0 mm x 45 mm screw was chosen to be placed later following decompression and TLIF.  C-arm fluoroscopy was then brought into the field and using C-arm fluoroscopy then the hole into the pedicle of left S1 was placed and observed with ball-tipped probe the S1 pedicle on this side was 7.0 mm x 35 mm.Tapping was then performed from 4.75 mm to 6.0 mm and a 7.47m59m 52m27mACT pedicle screw carefully passed down the center of the left S1 pedicle to a depth of nearly 35 mm. Observed on C-arm fluoroscopy to be in good position alignment channel was probed with a ball-tipped probe ensure patency no sign of cortical disruption. Following tapping with a 5 mm tap and a 6.0 x 35 mm screw was placed on the left side pedicle at S1. C-arm fluoroscopy was used to localize the hole  made in the medial aspect of the pedicle of S1 on the right localizing the pedicle within the spinal canal with nerve hook and hockey-stick nerve probe carefully passed down the center of the S1 pedicle to a depth of nearly 35 mm. Observed on C-arm fluoroscopy to be in good position alignment channel was probed with a ball-tipped probe ensure predicle screw  to be placed on the right side at the S1 level following decompression and TLIFs..C-arm fluoroscopy was used to localize the hole made in the lateral aspect of the pedicle of S1 on the right localizing the pedicle within the spinal canal with nerve hook and hockey-stick nerve probe re patency no sign of cortical disruption. Following tapping with a 4 mm, 1m and 6 mm taps a 7.0 x 35 mm screw was placed on the table to be inserted oon the right side at S1 following decompression and TLIF.    The right side decompression was carried out but complete facetectomies were perform on the right at L5-S1 to provide for exposure of the right side L5-S1 neuroforamen for ease of placement of TLIF (transforaminal lumbar interbody fusion) at L5 level inferior portions of the lamina and pars were also resected first beginning with the Leksell rongeur and osteotomes and then resecting using 2 and 3 mm Kerrisons. The right inferior lamina of L4 was partially resected with an osteotome and the high speed burr performing and partial hemilaminectomy of the right L4-5 level. Continued laminectomy was carried out resecting the central right portions of the lamina of L4 and L5, The ligamentum flavum then resected on the right side at L4-5 and L5-S1 foraminotomies performed on the right side at the L4, L5 and S1levels. The inferior articular process L5 was resected on the right side. The right L5 nerve root identified at the medial aspect of the L5 pedicle. Superior articular process of S1 was then resected from the right side further decompressing the right L5 nerve and providing for exposure of the area just superior to the S1 pedicle for a placement of cage.A large amount of hypertrophic ligmentum flavum was found impressing on the right lateral recesses at L4-5 and L5-S1 and narrowing the respective L4, L5 and S1neuroforamen. Loupe magnification and headlight were used during the initial portion of this part  procedure then the OR Microscope carefully draped and brought into the field.  Attention then turned to placement of the transforaminal lumbar interbody fusion cage right L5-S1. Using a Penfield 4 the right lateral aspect of the thecal sac at the L5-S1 disc space was carefully freed up The thecal sac could then easily be retracted in the posterior lateral aspect of the L5-S1 disc was exposed 15 blade scalpel used to incise the posterolateral disc and an osteotome used to resect a small portion of bone off the  superior aspect of the posterior superior vertebral body of S1 in order to ease the entry into the L5-S1 disc space. A  288mkerrison rongeur was then able to be introduced in the disc space debrided it was quite narrow. 7 mm dilator shaver was used to dialate the L5-S1 disc space on the right side further dilation cared out to 10 mm successfully and using small curettes and the disc space was debrided a moderate degenerative disc present in the endplates debrided to bleeding endplate bone. Shavers were inserted to trial the intervertebral disc space. A 11 mm x 2148mepuy [place] LIFT cage was carefully packed with morcellized bone graft and the been  harvested from previous laminotomies.The cage was then inserted with the articulating insertion handle.  Additional bone graft, vivigen, local bone graft and cancellous chips were then packed into the intervertebral disc space.Bone grafting performed before cage insertion using a sleeve and also following the placement and deploying of the LIFT cage and elevation to 11 mm.  Bleeding controlled using bipolar electrocautery thrombin soaked gel cottonoids. Observed on C-arm fluoroscopy to be in good position alignment. The cage at L5-S1 was placed anteriorly as best as possible the correct patient's kyphosis that was present. With this then the transforaminal lumbar interbody fusion portion of the case was completed bleeders were controlled using bipolar electrocautery thrombin-soaked Gelfoam were appropriate.Decortication of the facet joints carried out bilateral L5-S1. These were packed with cancellous local bone graft. 2 pedicle screws on the left already in place, the 2 viper corticofixation screws on the right were each placed and then each fastener carefully aligned  to allow for placement of rods.At this point cement fixation of the screws was performed. This was done by placing the adaptor to the screw fasteners first the left S1 and the right L5. Then mixing the  cement and placing into the cannula inserted into the posterior screws via a tubing and screw injection apparatus. After performing the first 2 screws the adaptors was placed into the left L5 and right S1 and these pedicle screws were injected and cement used to fix the screws.Careful C-arm evaluation was carried out with the injections to ensure proper fixation. No significant extrapedicular extravasation occurred.  The right side first quarter inch titanium rod was then carefully contoured using the french benders and a precontoured 77m rod. This was then placed into the pedicle screws on the right extending from L5-S1 each of the caps were carefully placed loosely tightened. Attention turned to the left side were similarly and then screws were carefully adjusted to allow for a better pattern screws to allow for placement of fixation of the rod a quarter inch 45 mm precontoured titanium rod was then carefully contoured. This was able to be inserted into the left L5 and S1pedicle screw fasteners, Caps onto the L5 fasteners were tightened to 80 foot lbs. Across the right side L5-S1 screw fasteners compression was obtained on the right side between L5 andS1 by compressing between the fasteners and tightening the screw caps 85 pounds. Similarly this was done on the left side at L5-S1 obtaining compression and tightened 85 pounds. Irrigation was carried out with copious amounts of saline solution this was done throughout the case. Cell Saver was used during the case. No cell saver blood was returned.Hockey stick neuroprobe was used to probe the neuroforamen right L4, L5 and S1, these were determined to be well decompressed. Permanent C-arm images were obtained in AP and lateral plane and oblique planes. Remaining local bone graft, vivigen and cancellous chips were then applied along the left lateral posterior lateral region extending from L5 toS1 posterior lamina and facet beds.Gelfoam was then removed spinal canal  the lumbodorsal musculature carefully exam debrided of any devitalized tissue following removal of Insight retractors were the bleeders were controlled using electrocautery and the area dorsal lumbar muscle were then approximated in the midline with interrupted #1 Vicryl sutures loose the dorsal fascia was reattached to the spinous process of L3 to superiorly and S1 inferiorly this was done with #1 Vicryl sutures. Subcutaneous layers then approximated using interrupted 0 Vicryl sutures and 2-0 Vicryl sutures. Skin was closed with a running subcutaneous stitch  of 4-0 Vicryl Dermabond was applied then MedPlex bandage. All instrument and sponge counts were correct. The patient was then returned to a supine position on her bed reactivated extubated and returned to the recovery room in satisfactory condition.   Benjiman Core, PA-C perform the duties of assistant surgeon during this case. He was present from the beginning of the case to the end of the case assisting in transfer the patient from his stretcher to the OR table and back to the stretcher at the end of the case. Assisted in careful retraction and suction of the laminectomy site delicate neural structures operating under the operating room microscope. He performed closure of the incision from the fascia to the skin applying the dressing.         [name]  03/15/2017, 1:57 PM

## 2017-03-15 NOTE — Brief Op Note (Signed)
03/15/2017  1:01 PM  PATIENT:  Summer Nielsen  84 y.o. female  PRE-OPERATIVE DIAGNOSIS:  foraminal stenosis L5-S1  POST-OPERATIVE DIAGNOSIS:  foraminal stenosis L5-S1  PROCEDURE:  Procedure(s): RIGHT L4-5 PARTIAL HEMILAMINECTOMY, RIGHT L5-S1 TRANSFORAMINAL LUMBAR INTERBODY FUSION WITH PEDICLE SCREWS WITH CEMENT, RODS, CAGES, LOCAL BONE GRAFT, VIVIGEN CANCELLOUS CHIPS (N/A)  SURGEON:  Surgeon(s) and Role:    * Jessy Oto, MD - Primary  PHYSICIAN ASSISTANT: Eugenia Mcalpine  ANESTHESIA:   local and general  EBL:  450 mL   BLOOD ADMINISTERED:0 CC CELLSAVER  DRAINS: Urinary Catheter (Foley)   LOCAL MEDICATIONS USED:  MARCAINE 0% 1:1 EXPAREL 1.3%, Amount: 30 ml  SPECIMEN:  No Specimen  DISPOSITION OF SPECIMEN:  N/A  COUNTS:  YES  TOURNIQUET:  * No tourniquets in log *  DICTATION: .Dragon Dictation  PLAN OF CARE: Admit to inpatient   PATIENT DISPOSITION:  PACU - hemodynamically stable.   Delay start of Pharmacological VTE agent (>24hrs) due to surgical blood loss or risk of bleeding: yes

## 2017-03-16 DIAGNOSIS — M4156 Other secondary scoliosis, lumbar region: Secondary | ICD-10-CM | POA: Diagnosis present

## 2017-03-16 LAB — CBC
HCT: 24.1 % — ABNORMAL LOW (ref 36.0–46.0)
Hemoglobin: 8 g/dL — ABNORMAL LOW (ref 12.0–15.0)
MCH: 31.9 pg (ref 26.0–34.0)
MCHC: 33.2 g/dL (ref 30.0–36.0)
MCV: 96 fL (ref 78.0–100.0)
Platelets: 234 10*3/uL (ref 150–400)
RBC: 2.51 MIL/uL — AB (ref 3.87–5.11)
RDW: 14.7 % (ref 11.5–15.5)
WBC: 9.3 10*3/uL (ref 4.0–10.5)

## 2017-03-16 LAB — BASIC METABOLIC PANEL
Anion gap: 10 (ref 5–15)
BUN: 18 mg/dL (ref 6–20)
CHLORIDE: 102 mmol/L (ref 101–111)
CO2: 26 mmol/L (ref 22–32)
CREATININE: 1.16 mg/dL — AB (ref 0.44–1.00)
Calcium: 8.2 mg/dL — ABNORMAL LOW (ref 8.9–10.3)
GFR calc Af Amer: 48 mL/min — ABNORMAL LOW (ref >60)
GFR calc non Af Amer: 41 mL/min — ABNORMAL LOW (ref >60)
Glucose, Bld: 110 mg/dL — ABNORMAL HIGH (ref 65–99)
Potassium: 3.8 mmol/L (ref 3.5–5.1)
SODIUM: 138 mmol/L (ref 135–145)

## 2017-03-16 LAB — GLUCOSE, CAPILLARY: Glucose-Capillary: 89 mg/dL (ref 65–99)

## 2017-03-16 LAB — PREPARE RBC (CROSSMATCH)

## 2017-03-16 LAB — HEMOGLOBIN AND HEMATOCRIT, BLOOD
HCT: 24.7 % — ABNORMAL LOW (ref 36.0–46.0)
Hemoglobin: 8.3 g/dL — ABNORMAL LOW (ref 12.0–15.0)

## 2017-03-16 MED ORDER — SODIUM CHLORIDE 0.9 % IV SOLN
Freq: Once | INTRAVENOUS | Status: AC
  Administered 2017-03-16: 15:00:00 via INTRAVENOUS

## 2017-03-16 MED ORDER — FUROSEMIDE 10 MG/ML IJ SOLN
20.0000 mg | Freq: Once | INTRAMUSCULAR | Status: AC
  Administered 2017-03-16: 20 mg via INTRAVENOUS
  Filled 2017-03-16: qty 4

## 2017-03-16 MED FILL — Heparin Sodium (Porcine) Inj 1000 Unit/ML: INTRAMUSCULAR | Qty: 30 | Status: AC

## 2017-03-16 MED FILL — Sodium Chloride IV Soln 0.9%: INTRAVENOUS | Qty: 1000 | Status: AC

## 2017-03-16 NOTE — Progress Notes (Signed)
Orthopedic Tech Progress Note Patient Details:  Summer Nielsen May 28, 1927 [id number]  Patient ID: Summer Nielsen, female   DOB: 03-29-1927, 84 y.o.   MRN: [id number]   Summer Nielsen 03/16/2017, 9:11 AM Called in bio-tech brace order; spoke with Summer Nielsen

## 2017-03-16 NOTE — Progress Notes (Signed)
Blood transfusion started, tnx reactions reviewed once again with patient. Family at bedside. Will continue to monitor.   Ave Filter, RN

## 2017-03-16 NOTE — Care Management Note (Signed)
Case Management Note  Patient Details  Name: Summer Nielsen MRN: [id number] Date of Birth: 04-14-1927  Subjective/Objective:    Pt is s/p lumbar surgery. She is from home with her spouse.                Action/Plan: Awaiting PT/OT evals. CM following for d/c needs, physician orders.   Expected Discharge Date:                  Expected Discharge Plan:     In-House Referral:     Discharge planning Services  CM Consult  Post Acute Care Choice:  Durable Medical Equipment Choice offered to:     DME Arranged:    DME Agency:     HH Arranged:    [place] Agency:     Status of Service:  In process, will continue to follow  If discussed at Long Length of Stay Meetings, dates discussed:    Additional Comments:  Pollie Friar, RN 03/16/2017, 12:27 PM

## 2017-03-16 NOTE — Evaluation (Addendum)
Occupational Therapy Evaluation Patient Details Name: Summer Nielsen MRN: [id number] DOB: May 09, 1927 Today's Date: 03/16/2017    History of Present Illness 84 yo female with c/o back pain and RLE radiculopathy diagnosis of collapsing lumbar scoliosis, central stenosis L4-5, right foraminal stenosis L5-S1. s/p 03/15/17 RIGHT L4-5 PARTIAL HEMILAMINECTOMY, RIGHT L5-S1 TRANSFORAMINAL LUMBAR INTERBODY FUSION WITH PEDICLE SCREWS WITH CEMENT, RODS, CAGES, LOCAL BONE GRAFT, VIVIGEN CANCELLOUS CHIPS   Clinical Impression   PTA, pt was living with her husband and independent with ADLs and IADLs. Pt currently requiring Min A for LB ADLs and Min Guard for functional mobility with RW. Providing education and handout on AE for LB ADLs. Pt donning underwear and socks with AE and Min A. Pt would benefit form further acute OT to facilitate safe dc. Recommend dc home with HHOT to optimize safety and independence with ADLs and functional mobility.     Follow Up Recommendations  Home health OT;Supervision/Assistance - 24 hour    Equipment Recommendations  None recommended by OT    Recommendations for Other Services PT consult     Precautions / Restrictions Precautions Precautions: Back;Fall Precaution Booklet Issued: Yes (comment) Precaution Comments: Pt able to recall 3/3 back precautions Required Braces or Orthoses: Spinal Brace(applied in sitting) Spinal Brace: Applied in sitting position;Lumbar corset Restrictions Weight Bearing Restrictions: No      Mobility Bed Mobility Overal bed mobility: Needs Assistance Bed Mobility: Rolling;Sidelying to Sit;Sit to Sidelying Rolling: Min assist Sidelying to sit: Min guard   Sit to supine: Min assist Sit to sidelying: Min assist General bed mobility comments: Min A for rolling safety and then again for returning to supien without twisting.   Transfers Overall transfer level: Needs assistance Equipment used: Rolling walker (2 wheeled) Transfers: Sit  to/from Stand Sit to Stand: Min guard         General transfer comment: Min Guard for safety    Balance Overall balance assessment: Needs assistance Sitting-balance support: Feet supported;No upper extremity supported Sitting balance-Leahy Scale: Fair     Standing balance support: Bilateral upper extremity supported Standing balance-Leahy Scale: Fair Standing balance comment: Able to perform LB dressing with UE support                            ADL either performed or assessed with clinical judgement   ADL Overall ADL's : Needs assistance/impaired Eating/Feeding: Set up;Sitting   Grooming: Min guard;Standing   Upper Body Bathing: Set up;Supervision/ safety;With adaptive equipment;Sitting   Lower Body Bathing: With adaptive equipment;Sit to/from stand;Minimal assistance Lower Body Bathing Details (indicate cue type and reason): Educated pt on use of AE for LB bathing Upper Body Dressing : Set up;Supervision/safety;Standing Upper Body Dressing Details (indicate cue type and reason): doff gown with supervision. Pt brace malfunctioning and RN ordered new one, so focused session on ADLs with AE. Lower Body Dressing: With adaptive equipment;Sit to/from stand;Minimal assistance Lower Body Dressing Details (indicate cue type and reason): Educated pt on use of AE for LB dressing. Pt donning undwear and socks with AE with Min A to manage AE and sequence correctly. Provided handout on AE information for carry over to home.              Functional mobility during ADLs: Min guard;Rolling walker General ADL Comments: Providing education on back precautions and LB ADLs with AE. Pt demonstrating understanding and very thanksful for information.      Vision  Perception     Praxis      Pertinent Vitals/Pain Pain Assessment: Faces Pain Score: 4  Faces Pain Scale: Hurts little more Pain Location: inscisional site Pain Descriptors / Indicators: Sore;Operative  site guarding Pain Intervention(s): Monitored during session;Repositioned     Hand Dominance Right   Extremity/Trunk Assessment Upper Extremity Assessment Upper Extremity Assessment: Overall WFL for tasks assessed   Lower Extremity Assessment Lower Extremity Assessment: Defer to PT evaluation   Cervical / Trunk Assessment Cervical / Trunk Assessment: Kyphotic;Other exceptions Cervical / Trunk Exceptions: s/p spinal surgery   Communication Communication Communication: No difficulties   Cognition Arousal/Alertness: Awake/alert Behavior During Therapy: WFL for tasks assessed/performed Overall Cognitive Status: Within Functional Limits for tasks assessed                                     General Comments  Husband, son, and daughter in law present    Exercises     Shoulder Instructions      Home Living Family/patient expects to be discharged to:: Private residence Living Arrangements: Spouse/significant other Available Help at Discharge: Family;Available 24 hours/day Type of Home: Apartment Home Access: Level entry     Home Layout: One level     Bathroom Shower/Tub: Teacher, early years/pre: Handicapped height Bathroom Accessibility: Yes   Home Equipment: Walker - 2 wheels;Grab bars - tub/shower;Hand held shower head;Cane - single point;Shower seat          Prior Functioning/Environment Level of Independence: Independent with assistive device(s)        Comments: limited community ambulation with cane and use of buggy and drives, independent with iADLs 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        OT Problem List: Decreased strength;Decreased range of motion;Decreased activity tolerance;Impaired balance (sitting and/or standing);Decreased safety awareness;Decreased knowledge of use of DME or AE;Decreased knowledge of precautions;Pain      OT Treatment/Interventions: Self-care/ADL training;Therapeutic exercise;Energy conservation;DME  and/or AE instruction;Therapeutic activities;Patient/family education    OT Goals(Current goals can be found in the care plan section) Acute Rehab OT Goals Patient Stated Goal: go home  OT Goal Formulation: With patient Time For Goal Achievement: 03/30/17 Potential to Achieve Goals: Good  OT Frequency: Min 2X/week   Barriers to D/C:            Co-evaluation              AM-PAC PT "6 Clicks" Daily Activity     Outcome Measure Help from another person eating meals?: None Help from another person taking care of personal grooming?: A Little Help from another person toileting, which includes using toliet, bedpan, or urinal?: A Little Help from another person bathing (including washing, rinsing, drying)?: A Little Help from another person to put on and taking off regular upper body clothing?: A Little Help from another person to put on and taking off regular lower body clothing?: A Little 6 Click Score: 19   End of Session Equipment Utilized During Treatment: Rolling walker Nurse Communication: Mobility status;Precautions  Activity Tolerance: Patient tolerated treatment well Patient left: in bed;with call bell/phone within reach;with bed alarm set;with family/visitor present  OT Visit Diagnosis: Unsteadiness on feet (R26.81);Other abnormalities of gait and mobility (R26.89);Muscle weakness (generalized) (M62.81);Pain Pain - part of body: (Back)                Time: 1610-9604 OT Time Calculation (min): 24 min Charges:  OT General Charges $OT Visit: 1 Visit OT Evaluation $OT Eval Moderate Complexity: 1 Mod OT Treatments $Self Care/Home Management : 8-22 mins G-Codes:     Summer Nielsen MSOT, OTR/L Acute Rehab Pager: [phone] Office: [place] 03/16/2017, 3:41 PM

## 2017-03-16 NOTE — Progress Notes (Signed)
Consent for blood transfusion in chart. Transfusion reaction and side effects reviewed with patient/ffamily. Will start transfusion once blood product ready.  Ave Filter, RN

## 2017-03-16 NOTE — Progress Notes (Addendum)
Subjective: Doing well.  Good pain control.  preop right leg pain gone.  C/o feeling fatigued, tired when up with PT. no c/o chest pain or SOB.    Objective: Vital signs in last 24 hours: Temp:  [97.8 F (36.6 C)-98.9 F (37.2 C)] 97.8 F (36.6 C) (01/09 1038) Pulse Rate:  [64-82] 64 (01/09 1038) Resp:  [8-21] 18 (01/09 1038) BP: (103-143)/(44-95) 123/44 (01/09 1038) SpO2:  [90 %-100 %] 91 % (01/09 1038)  Intake/Output from previous day: 01/08 0701 - 01/09 0700 In: 2746 [P.O.:120; I.V.:2276; IV Piggyback:350] Out: 1850 [Urine:1400; Blood:450] Intake/Output this shift: Total I/O In: 360 [P.O.:360] Out: -   Recent Labs    03/14/17 1012 03/16/17 0500  HGB 11.7* 8.0*   Recent Labs    03/14/17 1012 03/16/17 0500  WBC 8.1 9.3  RBC 3.82* 2.51*  HCT 36.9 24.1*  PLT 329 234   Recent Labs    03/14/17 1012 03/16/17 0500  NA 138 138  K 4.2 3.8  CL 102 102  CO2 28 26  BUN 22* 18  CREATININE 1.05* 1.16*  GLUCOSE 91 110*  CALCIUM 9.5 8.2*   Recent Labs    03/14/17 1012  INR 0.99    Exam: Very pleasant WF alert and oriented.  NAD.  Wound looks good.  No drainage or signs of infection.  Dressing changed.  bilat calves nontender.  NVI.  No focal motor deficits.    Assessment/Plan: Will transfuse one unit PRBC's for symptomatic anemia.  May need second unit.  Patient is wanting to d/c home when stable but we did discuss possible short SNF placement.  Family members present and we will see how she does.     Benjiman Core 03/16/2017, 1:34 PM    Patient seen 1915, doing well, VSS, post 1 unit PRBCs transfusion. No leg pain. Dressing has been changed. Requests assessment for SNF post hospitalization as her husband is not able to be of assistance and home situations of family not able to accomodate patient in the post hospitalization recuperation.  Will ask for Social Service eval.

## 2017-03-16 NOTE — Progress Notes (Signed)
Blood transfusion completed. Patient denied any distress. Post  trnx VSS. No other distress noted. H/H ordered. Will continue to monitor.   Ave Filter, RN

## 2017-03-16 NOTE — Evaluation (Signed)
Physical Therapy Evaluation Patient Details Name: Summer Nielsen MRN: [id number] DOB: [birth year] Today's Date: 03/16/2017   History of Present Illness  84 yo female with c/o back pain and RLE radiculopathy diagnosis of collapsing lumbar scoliosis, central stenosis L4-5, right foraminal stenosis L5-S1. s/p 03/15/17 RIGHT L4-5 PARTIAL HEMILAMINECTOMY, RIGHT L5-S1 TRANSFORAMINAL LUMBAR INTERBODY FUSION WITH PEDICLE SCREWS WITH CEMENT, RODS, CAGES, LOCAL BONE GRAFT, VIVIGEN CANCELLOUS CHIPS  Clinical Impression  Patient is s/p above surgery resulting in functional limitations due to the deficits listed below (see PT Problem List). PTA pt independent with ambulation with cane for limited community distances, drives and independent with ADLS, and iADLs. Pt is currently limited in her functional mobility by generalized weakness, pain and decreased endurance. Pt is currently minA for bed mobility, transfers and ambulation of 50 feet with RW. Patient will benefit from skilled PT to increase their independence and safety with mobility to allow discharge to the venue listed below.       Follow Up Recommendations Home health PT;Supervision/Assistance - 24 hour    Equipment Recommendations  None recommended by PT    Recommendations for Other Services OT consult     Precautions / Restrictions Precautions Precautions: Back;Fall Precaution Booklet Issued: Yes (comment) Precaution Comments: No bending, lifting, twisting booklet issued Required Braces or Orthoses: Spinal Brace(applied in sitting) Spinal Brace: Applied in sitting position;Lumbar corset Restrictions Weight Bearing Restrictions: No      Mobility  Bed Mobility Overal bed mobility: Needs Assistance Bed Mobility: Sit to Supine       Sit to supine: Min assist   General bed mobility comments: minA for LE management into bed  Transfers Overall transfer level: Needs assistance Equipment used: Rolling walker (2 wheeled) Transfers:  Sit to/from Stand Sit to Stand: Min assist         General transfer comment: minA for power up, vc for hand placement, good steadying in standing   Ambulation/Gait Ambulation/Gait assistance: Min assist Ambulation Distance (Feet): 50 Feet Assistive device: Rolling walker (2 wheeled) Gait Pattern/deviations: Decreased step length - right;Decreased step length - left;Shuffle Gait velocity: slowed Gait velocity interpretation: Below normal speed for age/gender General Gait Details: minA for steadying with RW, c/o of dizziness that decreased with movement vc for maintaining shoulders over hips and looking up and out with gait     Balance Overall balance assessment: Needs assistance Sitting-balance support: Feet supported;No upper extremity supported Sitting balance-Leahy Scale: Fair     Standing balance support: Bilateral upper extremity supported Standing balance-Leahy Scale: Poor Standing balance comment: requires RW assist to maintain balance                             Pertinent Vitals/Pain Pain Assessment: 0-10 Pain Score: 4  Pain Location: inscisional site Pain Descriptors / Indicators: Sore;Operative site guarding    Home Living Family/patient expects to be discharged to:: Private residence Living Arrangements: Spouse/significant other Available Help at Discharge: Family;Available 24 hours/day Type of Home: Apartment Home Access: Level entry     Home Layout: One level Home Equipment: Walker - 2 wheels;Tub bench;Grab bars - tub/shower;Hand held shower head;Cane - single point      Prior Function Level of Independence: Independent with assistive device(s)         Comments: limited community ambulation with cane and use of buggy and drives, independent with iADLs 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     Hand Dominance        Extremity/Trunk  Assessment   Upper Extremity Assessment Upper Extremity Assessment: Defer to OT evaluation    Lower  Extremity Assessment Lower Extremity Assessment: Overall WFL for tasks assessed    Cervical / Trunk Assessment Cervical / Trunk Assessment: Kyphotic  Communication   Communication: No difficulties  Cognition Arousal/Alertness: Awake/alert Behavior During Therapy: WFL for tasks assessed/performed Overall Cognitive Status: Within Functional Limits for tasks assessed                                        General Comments General comments (skin integrity, edema, etc.): Husband, son and daughter in-law present during session, pt brace buckle does does not stay secure nursing notified and vendor being contacted        Assessment/Plan    PT Assessment Patient needs continued PT services  PT Problem List Decreased range of motion;Decreased activity tolerance;Decreased balance;Decreased mobility;Pain       PT Treatment Interventions DME instruction;Gait training;Functional mobility training;Therapeutic activities;Therapeutic exercise;Balance training;Cognitive remediation;Patient/family education    PT Goals (Current goals can be found in the Care Plan section)  Acute Rehab PT Goals Patient Stated Goal: go home  PT Goal Formulation: With patient/family Time For Goal Achievement: 03/30/17 Potential to Achieve Goals: Good    Frequency Min 5X/week    AM-PAC PT "6 Clicks" Daily Activity  Outcome Measure Difficulty turning over in bed (including adjusting bedclothes, sheets and blankets)?: Unable Difficulty moving from lying on back to sitting on the side of the bed? : Unable Difficulty sitting down on and standing up from a chair with arms (e.g., wheelchair, bedside commode, etc,.)?: Unable Help needed moving to and from a bed to chair (including a wheelchair)?: A Little Help needed walking in hospital room?: A Little Help needed climbing 3-5 steps with a railing? : Total 6 Click Score: 10    End of Session Equipment Utilized During Treatment: Gait belt;Back  brace Activity Tolerance: Patient limited by fatigue Patient left: in bed;with call bell/phone within reach;with family/visitor present Nurse Communication: Mobility status PT Visit Diagnosis: Unsteadiness on feet (R26.81);Other abnormalities of gait and mobility (R26.89);Difficulty in walking, not elsewhere classified (R26.2);Pain;Dizziness and giddiness (R42) Pain - part of body: (back )    Time: 1219-7588 PT Time Calculation (min) (ACUTE ONLY): 40 min   Charges:   PT Evaluation $PT Eval Moderate Complexity: 1 Mod PT Treatments $Gait Training: 8-22 mins $Therapeutic Activity: 8-22 mins   PT G Codes:        Devonne Lalani B. Migdalia Dk PT, DPT Acute Rehabilitation  [phone] Pager [phone]    [place] 03/16/2017, 1:31 PM

## 2017-03-17 DIAGNOSIS — D62 Acute posthemorrhagic anemia: Secondary | ICD-10-CM | POA: Diagnosis not present

## 2017-03-17 LAB — CBC WITH DIFFERENTIAL/PLATELET
BASOS ABS: 0 10*3/uL (ref 0.0–0.1)
Basophils Relative: 0 %
EOS ABS: 0.3 10*3/uL (ref 0.0–0.7)
EOS PCT: 4 %
HCT: 26.3 % — ABNORMAL LOW (ref 36.0–46.0)
Hemoglobin: 8.4 g/dL — ABNORMAL LOW (ref 12.0–15.0)
LYMPHS ABS: 1.6 10*3/uL (ref 0.7–4.0)
LYMPHS PCT: 17 %
MCH: 30.1 pg (ref 26.0–34.0)
MCHC: 31.9 g/dL (ref 30.0–36.0)
MCV: 94.3 fL (ref 78.0–100.0)
MONO ABS: 1.4 10*3/uL — AB (ref 0.1–1.0)
Monocytes Relative: 16 %
Neutro Abs: 5.9 10*3/uL (ref 1.7–7.7)
Neutrophils Relative %: 63 %
Platelets: 234 10*3/uL (ref 150–400)
RBC: 2.79 MIL/uL — ABNORMAL LOW (ref 3.87–5.11)
RDW: 15 % (ref 11.5–15.5)
WBC: 9.2 10*3/uL (ref 4.0–10.5)

## 2017-03-17 LAB — TYPE AND SCREEN
ABO/RH(D): A POS
Antibody Screen: NEGATIVE
UNIT DIVISION: 0

## 2017-03-17 LAB — BPAM RBC
BLOOD PRODUCT EXPIRATION DATE: 201902012359
ISSUE DATE / TIME: 201901091516
UNIT TYPE AND RH: 6200

## 2017-03-17 MED ORDER — FERROUS GLUCONATE 324 (38 FE) MG PO TABS
324.0000 mg | ORAL_TABLET | Freq: Three times a day (TID) | ORAL | Status: DC
  Administered 2017-03-17 – 2017-03-18 (×4): 324 mg via ORAL
  Filled 2017-03-17 (×6): qty 1

## 2017-03-17 NOTE — NC FL2 (Signed)
[place] MEDICAID FL2 LEVEL OF CARE SCREENING TOOL     IDENTIFICATION  Patient Name: Summer Nielsen Birthdate: 08-Jun-1927 Sex: female Admission Date (Current Location): 03/15/2017  [facility] and [place] Number:  Herbalist and Address:  [facility], [place] [address] [place],  [address]      Provider Number: [id number]  Attending Physician Name and Address:  Jessy Oto, MD  Relative Name and Phone Number:       Current Level of Care: Hospital Recommended Level of Care: [place] Prior Approval Number:    Date Approved/Denied:   PASRR Number: [id number]  Discharge Plan: SNF    Current Diagnoses: Patient Active Problem List   Diagnosis Date Noted  . Other secondary scoliosis, lumbar region 03/16/2017    Class: Chronic  . Spinal stenosis of lumbar region 03/15/2017    Class: Chronic  . Status post lumbar spinal fusion 03/15/2017  . Closed displaced fracture of base of fifth metacarpal bone of left hand 10/12/2016  . Lumbar radiculopathy 08/26/2016  . Iron deficiency anemia 05/20/2016  . Chronic right shoulder pain 02/16/2016  . Acute diastolic heart failure ([place]) 10/30/2015  . Secondary pulmonary hypertension 10/30/2015  . Hypertensive heart disease with heart failure ([place]) 10/30/2015  . TIA (transient ischemic attack)   . 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 Postsurgical hypothyroidism   . OP (osteoporosis)   . Hyperlipidemia   . High blood pressure   . Glaucoma   . CHF (congestive heart failure) (HCC)     Orientation RESPIRATION BLADDER Height & Weight     Self, Time, Situation, Place  Normal Incontinent, Continent(incontinent at times) Weight: 143 lb 4.8 oz (65 kg) Height:  _0  (157.5 cm)  BEHAVIORAL SYMPTOMS/MOOD NEUROLOGICAL BOWEL NUTRITION STATUS      Continent Diet(heart healthy)  AMBULATORY STATUS COMMUNICATION OF NEEDS Skin   Limited Assist Verbally  Surgical wounds(closed back incision, 03/10/43; silicone dressing (mepilex))                       Personal Care Assistance Level of Assistance  Bathing, Feeding, Dressing Bathing Assistance: Limited assistance Feeding assistance: Independent Dressing Assistance: Limited assistance     Functional Limitations Info  Sight, Hearing, Speech Sight Info: Adequate Hearing Info: Adequate Speech Info: Adequate    SPECIAL CARE FACTORS FREQUENCY  PT (By licensed PT), OT (By licensed OT)     PT Frequency: 5x/wk OT Frequency: 5x/wk            Contractures Contractures Info: Not present    Additional Factors Info  Code Status, Allergies Code Status Info: Full Allergies Info: Levofloxacin, Percocet Oxycodone-acetaminophen           Current Medications (03/17/2017):  This is the current hospital active medication list Current Facility-Administered Medications  Medication Dose Route Frequency Provider Last Rate Last Dose  . 0.9 %  sodium chloride infusion  250 mL Intravenous Continuous Jessy Oto, MD 1 mL/hr at 03/15/17 2052    . 0.9 %  sodium chloride infusion   Intravenous Continuous Jessy Oto, MD 75 mL/hr at 03/15/17 2052    . acetaminophen (TYLENOL) tablet 650 mg  650 mg Oral Q4H PRN Jessy Oto, MD       Or  . acetaminophen (TYLENOL) suppository 650 mg  650 mg Rectal Q4H PRN Jessy Oto, MD      . alum & mag hydroxide-simeth (MAALOX/MYLANTA) 200-200-20  MG/5ML suspension 30 mL  30 mL Oral Q6H PRN Jessy Oto, MD      . amLODipine (NORVASC) tablet 5 mg  5 mg Oral Daily Jessy Oto, MD   5 mg at 03/16/17 0905  . atorvastatin (LIPITOR) tablet 20 mg  20 mg Oral Daily Jessy Oto, MD   20 mg at 03/17/17 0820  . B-complex with vitamin C tablet 1 tablet  1 tablet Oral Daily Jessy Oto, MD   1 tablet at 03/17/17 0102   And  . folic acid (FOLVITE) tablet 1 mg  1 mg Oral Daily Jessy Oto, MD   1 mg at 03/17/17 7253  . bisacodyl (DULCOLAX) EC tablet 5 mg   5 mg Oral Daily PRN Jessy Oto, MD      . calcium-vitamin D (OSCAL WITH D) 500-200 MG-UNIT per tablet 1 tablet  1 tablet Oral Daily Jessy Oto, MD   1 tablet at 03/17/17 0820  . docusate sodium (COLACE) capsule 100 mg  100 mg Oral BID Jessy Oto, MD   100 mg at 03/17/17 0820  . ferrous gluconate (FERGON) tablet 324 mg  324 mg Oral TID WC Jessy Oto, MD   324 mg at 03/17/17 1217  . furosemide (LASIX) tablet 40 mg  40 mg Oral BID Jessy Oto, MD   40 mg at 03/17/17 6644  . gabapentin (NEURONTIN) capsule 300 mg  300 mg Oral TID Jessy Oto, MD   300 mg at 03/17/17 0347  . HYDROcodone-acetaminophen (NORCO) 7.5-325 MG per tablet 1 tablet  1 tablet Oral Q6H Jessy Oto, MD   1 tablet at 03/17/17 1217  . HYDROcodone-acetaminophen (NORCO) 7.5-325 MG per tablet 2 tablet  2 tablet Oral Q4H PRN Jessy Oto, MD      . HYDROcodone-acetaminophen (NORCO/VICODIN) 5-325 MG per tablet 1 tablet  1 tablet Oral Q4H PRN Jessy Oto, MD   1 tablet at 03/15/17 2054  . latanoprost (XALATAN) 0.005 % ophthalmic solution 1 drop  1 drop Both Eyes QHS Jessy Oto, MD   1 drop at 03/16/17 2222  . levothyroxine (SYNTHROID, LEVOTHROID) tablet 100 mcg  100 mcg Oral QAC breakfast Jessy Oto, MD   100 mcg at 03/17/17 0543  . losartan (COZAAR) tablet 50 mg  50 mg Oral Daily Jessy Oto, MD   50 mg at 03/16/17 0905  . menthol-cetylpyridinium (CEPACOL) lozenge 3 mg  1 lozenge Oral PRN Jessy Oto, MD       Or  . phenol (CHLORASEPTIC) mouth spray 1 spray  1 spray Mouth/Throat PRN Jessy Oto, MD      . methocarbamol (ROBAXIN) tablet 500 mg  500 mg Oral Q6H PRN Jessy Oto, MD   500 mg at 03/17/17 4259   Or  . methocarbamol (ROBAXIN) 500 mg in dextrose 5 % 50 mL IVPB  500 mg Intravenous Q6H PRN Jessy Oto, MD      . morphine 4 MG/ML injection 1 mg  1 mg Intravenous Q2H PRN Jessy Oto, MD   1 mg at 03/17/17 0932  . multivitamin with minerals tablet 1 tablet  1 tablet Oral Daily  Jessy Oto, MD   1 tablet at 03/17/17 [phone]  . ondansetron (ZOFRAN) tablet 4 mg  4 mg Oral Q6H PRN Jessy Oto, MD       Or  . ondansetron [facility]) injection 4 mg  4 mg Intravenous Q6H PRN  Jessy Oto, MD   4 mg at 03/16/17 1154  . pantoprazole (PROTONIX) EC tablet 40 mg  40 mg Oral Daily Jessy Oto, MD   40 mg at 03/17/17 2395  . polyethylene glycol (MIRALAX / GLYCOLAX) packet 17 g  17 g Oral Daily PRN Jessy Oto, MD      . propranolol (INDERAL) tablet 20 mg  20 mg Oral BID Jessy Oto, MD   20 mg at 03/17/17 0820  . sodium chloride flush (NS) 0.9 % injection 3 mL  3 mL Intravenous Q12H Jessy Oto, MD   3 mL at 03/16/17 2224  . sodium chloride flush (NS) 0.9 % injection 3 mL  3 mL Intravenous PRN Jessy Oto, MD      . sodium phosphate (FLEET) 7-19 GM/118ML enema 1 enema  1 enema Rectal Once PRN Jessy Oto, MD         Discharge Medications: Please see discharge summary for a list of discharge medications.  Relevant Imaging Results:  Relevant Lab Results:   Additional Information SS#: [id number]  Geralynn Ochs, LCSW

## 2017-03-17 NOTE — Progress Notes (Signed)
CSW received call from patient's son that they had toured facilities and would like to choose [facility] in [place]. CSW confirmed bed availability with Admissions. Admissions at [facility] will begin insurance authorization process.  CSW will continue to follow.  Laveda Abbe, [place] Clinical Social Worker [phone]

## 2017-03-17 NOTE — Progress Notes (Signed)
Physical Therapy Treatment Patient Details Name: Summer Nielsen MRN: [id number] DOB: 1927-09-18 Today's Date: 03/17/2017    History of Present Illness 84 yo female with c/o back pain and RLE radiculopathy diagnosis of collapsing lumbar scoliosis, central stenosis L4-5, right foraminal stenosis L5-S1. s/p 03/15/17 RIGHT L4-5 PARTIAL HEMILAMINECTOMY, RIGHT L5-S1 TRANSFORAMINAL LUMBAR INTERBODY FUSION WITH PEDICLE SCREWS WITH CEMENT, RODS, CAGES, LOCAL BONE GRAFT, VIVIGEN CANCELLOUS CHIPS    PT Comments     Pt is making slow progress towards her goals, however continues to be limited in her safe mobility by pain and generalized weakness and fatigue. Pt is currently minA for bed mobility, min A for proper donning of back brace, minA for transfer to RW and minA for ambulation of 80 feet with RW. Given need for assist with all mobility pt would benefit from the more intensive rehab and 24 hour assist provided by SNF level care to quickly return to her prior independent level of function. PT will continue to follow pt until discharge.   Follow Up Recommendations  SNF     Equipment Recommendations  None recommended by PT    Recommendations for Other Services OT consult     Precautions / Restrictions Precautions Precautions: Back;Fall Precaution Booklet Issued: Yes (comment) Precaution Comments: No bending, lifting, twisting booklet issued Required Braces or Orthoses: Spinal Brace(applied in sitting) Spinal Brace: Applied in sitting position;Lumbar corset(requires assist to get tight enough ) Restrictions Weight Bearing Restrictions: No    Mobility  Bed Mobility Overal bed mobility: Needs Assistance Bed Mobility: Sit to Supine Rolling: Min assist Sidelying to sit: Min assist       General bed mobility comments: minA for bringing pt all the way onto her side before attempting sidelying to sit, vc for LE management off of bed and minA for assisting trunk to upright    Transfers Overall transfer level: Needs assistance Equipment used: Rolling walker (2 wheeled) Transfers: Sit to/from Stand Sit to Stand: Min assist         General transfer comment: minA for power up, vc for hand placement, good steadying in standing   Ambulation/Gait Ambulation/Gait assistance: Min assist Ambulation Distance (Feet): 80 Feet Assistive device: Rolling walker (2 wheeled) Gait Pattern/deviations: Decreased step length - right;Decreased step length - left;Shuffle Gait velocity: slowed Gait velocity interpretation: Below normal speed for age/gender General Gait Details: minA for steadying with RW, vc for proximity to RW and upright posture, decreased L knee flexion and increased hip ER and hip hike for L LE advancement as pt fatigued        Balance Overall balance assessment: Needs assistance Sitting-balance support: Feet supported;No upper extremity supported Sitting balance-Leahy Scale: Fair     Standing balance support: Bilateral upper extremity supported Standing balance-Leahy Scale: Poor Standing balance comment: requires RW assist to maintain balance                            Cognition Arousal/Alertness: Awake/alert Behavior During Therapy: WFL for tasks assessed/performed Overall Cognitive Status: Within Functional Limits for tasks assessed                                           General Comments General comments (skin integrity, edema, etc.): Husband present during session      Pertinent Vitals/Pain Pain Assessment: Faces Faces Pain Scale: Hurts little more Pain Location:  inscisional site Pain Descriptors / Indicators: Sore;Operative site guarding Pain Intervention(s): Monitored during session;Limited activity within patient's tolerance;Repositioned           PT Goals (current goals can now be found in the care plan section) Acute Rehab PT Goals Patient Stated Goal: go home  PT Goal Formulation: With  patient/family Time For Goal Achievement: 03/30/17 Potential to Achieve Goals: Good Progress towards PT goals: Progressing toward goals    Frequency    Min 2X/week      PT Plan Discharge plan needs to be updated    Co-evaluation              AM-PAC PT "6 Clicks" Daily Activity  Outcome Measure  Difficulty turning over in bed (including adjusting bedclothes, sheets and blankets)?: Unable Difficulty moving from lying on back to sitting on the side of the bed? : Unable Difficulty sitting down on and standing up from a chair with arms (e.g., wheelchair, bedside commode, etc,.)?: Unable Help needed moving to and from a bed to chair (including a wheelchair)?: A Little Help needed walking in hospital room?: A Little Help needed climbing 3-5 steps with a railing? : Total 6 Click Score: 10    End of Session Equipment Utilized During Treatment: Gait belt;Back brace Activity Tolerance: Patient limited by fatigue Patient left: in bed;with call bell/phone within reach;with family/visitor present Nurse Communication: Mobility status PT Visit Diagnosis: Unsteadiness on feet (R26.81);Other abnormalities of gait and mobility (R26.89);Difficulty in walking, not elsewhere classified (R26.2);Pain;Dizziness and giddiness (R42) Pain - part of body: (back )     Time: 9802-2179 PT Time Calculation (min) (ACUTE ONLY): 25 min  Charges:  $Gait Training: 8-22 mins $Therapeutic Exercise: 8-22 mins                    G Codes:     Summer Nielsen B. Migdalia Dk PT, DPT Acute Rehabilitation  [phone] Pager [phone]     [place] 03/17/2017, 4:08 PM

## 2017-03-17 NOTE — Progress Notes (Signed)
     Subjective: 2 Days Post-Op Procedure(s) (LRB): RIGHT L4-5 PARTIAL HEMILAMINECTOMY, RIGHT L5-S1 TRANSFORAMINAL LUMBAR INTERBODY FUSION WITH PEDICLE SCREWS WITH CEMENT, RODS, CAGES, LOCAL BONE GRAFT, VIVIGEN CANCELLOUS CHIPS (N/A) Awake, alert and oriented x 4. Sitting at bedside in recliner with breakfast, no numbness or leg pain, dressing dry, wearing Aspen brace. Discussed concern about returning to home with  Her and will ask for Social Service Consult. She has history of chronic anemia, uses a fusion supplement with iron.  No BM.  Patient reports pain as moderate.    Objective:   VITALS:  Temp:  [97.6 F (36.4 C)-98.4 F (36.9 C)] 98.2 F (36.8 C) (01/10 0416) Pulse Rate:  [62-78] 63 (01/10 0416) Resp:  [16-18] 18 (01/10 0416) BP: (100-137)/(31-95) 137/58 (01/10 0416) SpO2:  [91 %-96 %] 96 % (01/10 0416) Weight:  [143 lb 4.8 oz (65 kg)] 143 lb 4.8 oz (65 kg) (01/09 2130)  Neurologically intact ABD soft Neurovascular intact Sensation intact distally Intact pulses distally Dorsiflexion/Plantar flexion intact Incision: dressing C/D/I   LABS Recent Labs    03/14/17 1012 03/16/17 0500 03/16/17 2136  HGB 11.7* 8.0* 8.3*  WBC 8.1 9.3  --   PLT 329 234  --    Recent Labs    03/14/17 1012 03/16/17 0500  NA 138 138  K 4.2 3.8  CL 102 102  CO2 28 26  BUN 22* 18  CREATININE 1.05* 1.16*  GLUCOSE 91 110*   Recent Labs    03/14/17 1012  INR 0.99     Assessment/Plan: 2 Days Post-Op Procedure(s) (LRB): RIGHT L4-5 PARTIAL HEMILAMINECTOMY, RIGHT L5-S1 TRANSFORAMINAL LUMBAR INTERBODY FUSION WITH PEDICLE SCREWS WITH CEMENT, RODS, CAGES, LOCAL BONE GRAFT, VIVIGEN CANCELLOUS CHIPS (N/A)  Anemia due to acute blood loss, recheck H/H this afternoon and start iron supplements.   Advance diet Up with therapy Discharge to SNF social service consult placed.   [name] 03/17/2017, 8:12 AMPatient ID: Summer Nielsen, female   DOB: Nov 30, 1927, 84 y.o.   MRN:  [id number]

## 2017-03-17 NOTE — Clinical Social Work Note (Signed)
Clinical Social Work Assessment  Patient Details  Name: Summer Nielsen MRN: [id number] Date of Birth: [birth year]  Date of referral:  03/17/17               Reason for consult:  Facility Placement                Permission sought to share information with:  Facility Sport and exercise psychologist, Family Supports Permission granted to share information::  Yes, Verbal Permission Granted  Name::     [facility]  Agency::  SNF  Relationship::  Husband, Son  Sport and exercise psychologist Information:     Housing/Transportation Living arrangements for the past 2 months:  Apartment Source of Information:  Patient, Adult Children, Spouse Patient Interpreter Needed:  None Criminal Activity/Legal Involvement Pertinent to Current Situation/Hospitalization:  No - Comment as needed Significant Relationships:  Adult Children, Spouse Lives with:  Self, Spouse Do you feel safe going back to the place where you live?  Yes Need for family participation in patient care:  No (Coment)  Care giving concerns:  Patient lives at home with spouse, but is primary caretaker for the spouse at home. Patient will need SNF at discharge to improve ability to care for herself before she returns home so that she can adequately provide care for the spouse.   Social Worker assessment / plan:  CSW met with patient, patient's son, and patient's husband at bedside to discuss recommendation for SNF. CSW discussed referral process and provided facility list. CSW encouraged patient's family to research facilities and to determine preferences. CSW faxed out referral and will continue to follow.  Employment status:  Retired Nurse, adult PT Recommendations:  [place] / Referral to community resources:  [place]  Patient/Family's Response to care:  Patient and family agreeable to SNF placement.  Patient/Family's Understanding of and Emotional Response to Diagnosis, Current  Treatment, and Prognosis:  Patient indicated agreement with needing rehab prior to returning home, because she isn't allowed to twist or bend for a while and no one is home to assist her with things that she can't do herself. Patient and family discussed facility options, and patient's son said he would go and research facility options.   Emotional Assessment Appearance:  Appears stated age Attitude/Demeanor/Rapport:  Engaged Affect (typically observed):  Appropriate, Pleasant, Calm Orientation:  Oriented to Self, Oriented to  Time, Oriented to Place, Oriented to Situation Alcohol / Substance use:  Not Applicable Psych involvement (Current and /or in the community):  No (Comment)  Discharge Needs  Concerns to be addressed:  Care Coordination Readmission within the last 30 days:  No Current discharge risk:  Physical Impairment, Dependent with Mobility Barriers to Discharge:  Continued Medical Work up, [place], [place] 03/17/2017, 5:44 PM

## 2017-03-18 MED ORDER — GABAPENTIN 300 MG PO CAPS: 300.0000 mg | ORAL_CAPSULE | Freq: Every day | ORAL | 1 refills | Status: DC

## 2017-03-18 MED ORDER — FERROUS GLUCONATE 324 (38 FE) MG PO TABS: 324.0000 mg | ORAL_TABLET | Freq: Three times a day (TID) | ORAL | 1 refills | Status: DC

## 2017-03-18 MED ORDER — POLYETHYLENE GLYCOL 3350 17 G PO PACK
17.0000 g | PACK | Freq: Every day | ORAL | Status: DC
  Administered 2017-03-18: 17 g via ORAL

## 2017-03-18 MED ORDER — METHOCARBAMOL 500 MG PO TABS: 500.0000 mg | ORAL_TABLET | Freq: Four times a day (QID) | ORAL | 1 refills | Status: DC | PRN

## 2017-03-18 MED ORDER — HYDROCODONE-ACETAMINOPHEN 5-325 MG PO TABS: 1.0000 | ORAL_TABLET | ORAL | 0 refills | Status: DC | PRN

## 2017-03-18 NOTE — Discharge Summary (Signed)
Physician Discharge Summary      Patient ID: Summer Nielsen MRN: [id number] DOB/AGE: 82-Sep-1929 82 y.o.  Admit date: 03/15/2017 Discharge date:03/18/2017   Admission Diagnoses:  Active Problems:   Spinal stenosis of lumbar region   Other secondary scoliosis, lumbar region   Anemia due to acute blood loss   Status post lumbar spinal fusion   Discharge Diagnoses:  Same  Past Medical History:  Diagnosis Date  . Cancer ([place])    squamous cell skin cancer on left leg-removed  . CHF (congestive heart failure) ([place])   . Complication of anesthesia   . GERD (gastroesophageal reflux disease)   . Glaucoma   . High blood pressure   . Hyperlipidemia   . Hypothyroidism   . OP (osteoporosis)   . PONV (postoperative nausea and vomiting)   . Postsurgical hypothyroidism   . 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 Tachycardia   . TIA (transient ischemic attack)     Surgeries: Procedure(s): RIGHT L4-5 PARTIAL HEMILAMINECTOMY, RIGHT L5-S1 TRANSFORAMINAL LUMBAR INTERBODY FUSION WITH PEDICLE SCREWS WITH CEMENT, RODS, CAGES, LOCAL BONE GRAFT, VIVIGEN CANCELLOUS CHIPS on 03/15/2017   Consultants:   Discharged Condition: Improved  Hospital Course: Summer Nielsen is an 84 y.o. female who was admitted 03/15/2017 with a chief complaint of No chief complaint on file. , and found to have a diagnosis of <principal problem not specified>.  She was brought to the operating room on 03/15/2017 and underwent the above named procedures.    She was given perioperative antibiotics:  Anti-infectives (From admission, onward)   Start     Dose/Rate Route Frequency Ordered Stop   03/15/17 1900  ceFAZolin (ANCEF) IVPB 2g/100 mL premix     2 g 200 mL/hr over 30 Minutes Intravenous Every 8 hours 03/15/17 1738 03/16/17 0344   03/15/17 0633  ceFAZolin (ANCEF) IVPB 2g/100 mL premix     2 g 200 mL/hr over 30 Minutes Intravenous On call to O.R. 03/15/17 6015 03/15/17 0830      Recovered uneventfully in PACU and was transferred to 3W progression unit for neurosurgery, her leg pain relieve, neurovascular exam normal. VSS on POD#1. She noted fatigue and some dizziness with beginning PT. Hgb was decreased due to operative and periopertive blood loss and she received one unit of PRBCs on POD#1. POD#2 social services consulted as patient's home situation was not adequate to allow for assistance In her ADLs 24 hours per day. Family consulted and decision for [facility] SNF made. Family visited the facility and gave approval. Started on Ferrous gluconate for anemia. VSS neurologically normal post op exam with relief of claudication and radicular pain. Pain due to surgery controlled with po narcotic of hydrocodone. POD#3 awake alert and oriented x 4. Her incision dry without signs of infection of abnormality. Legs NV normal, no BM as yet and a daily laxative of Miralax ordered. SNF was obtained and Insurance authorization undertaken. She is stable on POD#3 for discharge to the SNF. Will continue with brace when out of bed. May bath and change dressing following bathing. A follow up visit with Dr. Louanne Skye in 2 weeks is recommended as is Follow up with her primary care physician, Dr. Justin Mend to surveillance her anemia. All  Questions answered.   She was given sequential compression devices, early ambulation, and chemoprophylaxis for DVT prophylaxis.  She benefited maximally from their hospital stay and there were no complications.    Recent vital signs:  Vitals:   03/17/17 2100 03/18/17 0439  BP: (!) 128/57 112/67  Pulse: 83 80  Resp: 18 16  Temp: 99 F (37.2 C) 99.2 F (37.3 C)  SpO2: 93% 92%    Recent laboratory studies:  Results for orders placed or performed during the hospital encounter of 03/15/17  CBC  Result Value Ref Range   WBC 9.3 4.0 - 10.5 K/uL   RBC 2.51 (L) 3.87 - 5.11 MIL/uL   Hemoglobin 8.0 (L) 12.0 - 15.0 g/dL   HCT 24.1 (L) 36.0 - 46.0 %   MCV 96.0 78.0 -  100.0 fL   MCH 31.9 26.0 - 34.0 pg   MCHC 33.2 30.0 - 36.0 g/dL   RDW 14.7 11.5 - 15.5 %   Platelets 234 150 - 400 K/uL  Basic Metabolic Panel  Result Value Ref Range   Sodium 138 135 - 145 mmol/L   Potassium 3.8 3.5 - 5.1 mmol/L   Chloride 102 101 - 111 mmol/L   CO2 26 22 - 32 mmol/L   Glucose, Bld 110 (H) 65 - 99 mg/dL   BUN 18 6 - 20 mg/dL   Creatinine, Ser 1.16 (H) 0.44 - 1.00 mg/dL   Calcium 8.2 (L) 8.9 - 10.3 mg/dL   GFR calc non Af Amer 41 (L) >60 mL/min   GFR calc Af Amer 48 (L) >60 mL/min   Anion gap 10 5 - 15  Glucose, capillary  Result Value Ref Range   Glucose-Capillary 89 65 - 99 mg/dL   Comment 1 Notify RN    Comment 2 Document in Chart   Hemoglobin and hematocrit, blood  Result Value Ref Range   Hemoglobin 8.3 (L) 12.0 - 15.0 g/dL   HCT 24.7 (L) 36.0 - 46.0 %  CBC with Differential/Platelet  Result Value Ref Range   WBC 9.2 4.0 - 10.5 K/uL   RBC 2.79 (L) 3.87 - 5.11 MIL/uL   Hemoglobin 8.4 (L) 12.0 - 15.0 g/dL   HCT 26.3 (L) 36.0 - 46.0 %   MCV 94.3 78.0 - 100.0 fL   MCH 30.1 26.0 - 34.0 pg   MCHC 31.9 30.0 - 36.0 g/dL   RDW 15.0 11.5 - 15.5 %   Platelets 234 150 - 400 K/uL   Neutrophils Relative % 63 %   Neutro Abs 5.9 1.7 - 7.7 K/uL   Lymphocytes Relative 17 %   Lymphs Abs 1.6 0.7 - 4.0 K/uL   Monocytes Relative 16 %   Monocytes Absolute 1.4 (H) 0.1 - 1.0 K/uL   Eosinophils Relative 4 %   Eosinophils Absolute 0.3 0.0 - 0.7 K/uL   Basophils Relative 0 %   Basophils Absolute 0.0 0.0 - 0.1 K/uL  Type and screen [place]  Result Value Ref Range   ABO/RH(D) A POS    Antibody Screen NEG    Sample Expiration 03/18/2017    Unit Number [id number]    Blood Component Type RED CELLS,LR    Unit division 00    Status of Unit ISSUED,FINAL    Transfusion Status OK TO TRANSFUSE    Crossmatch Result Compatible   Prepare RBC  Result Value Ref Range   Order Confirmation ORDER PROCESSED BY BLOOD BANK   BPAM RBC  Result Value Ref Range     ISSUE DATE / TIME [id number]    Blood Product Unit Number [id number]    PRODUCT CODE [id number]    Unit Type and Rh 2774    Blood Product Expiration Date [id number]     Discharge Medications:  Allergies as of 03/18/2017      Reactions   Levofloxacin Nausea Only   Dizzy, hot, foot pain, insomnia   Percocet [oxycodone-acetaminophen] Nausea And Vomiting, Other (See Comments)   sick      Medication List    TAKE these medications   acetaminophen 500 MG tablet Commonly known as:  TYLENOL Take 1,000 mg by mouth daily as needed for moderate pain or headache.   amLODipine 5 MG tablet Commonly known as:  NORVASC Take 5 mg by mouth daily.   atorvastatin 20 MG tablet Commonly known as:  LIPITOR Take 20 mg by mouth daily.   bimatoprost 0.01 % Soln Commonly known as:  LUMIGAN Place 1 drop into both eyes at bedtime.   calcium-vitamin D 500-200 MG-UNIT tablet Commonly known as:  OSCAL WITH D Take 1 tablet by mouth daily.   CENTRUM SILVER PO Take 1 tablet by mouth daily.   ferrous gluconate 324 MG tablet Commonly known as:  FERGON Take 1 tablet (324 mg total) by mouth 3 (three) times daily with meals.   furosemide 40 MG tablet Commonly known as:  LASIX Take 40 mg by mouth 2 (two) times daily.   FUSION PLUS Caps Take 1 capsule by mouth daily.   gabapentin 300 MG capsule Commonly known as:  NEURONTIN Take 1 capsule (300 mg total) by mouth 3 (three) times daily. What changed:  Another medication with the same name was added. Make sure you understand how and when to take each.   gabapentin 300 MG capsule Commonly known as:  NEURONTIN Take 1 capsule (300 mg total) by mouth at bedtime. What changed:  You were already taking a medication with the same name, and this prescription was added. Make sure you understand how and when to take each.   HYDROcodone-acetaminophen 5-325 MG tablet Commonly known as:  NORCO/VICODIN Take 1 tablet by mouth every 6 (six) hours as  needed for moderate pain. What changed:  Another medication with the same name was added. Make sure you understand how and when to take each.   HYDROcodone-acetaminophen 5-325 MG tablet Commonly known as:  NORCO/VICODIN Take 1 tablet by mouth every 4 (four) hours as needed for moderate pain ((score 4 to 6)). What changed:  You were already taking a medication with the same name, and this prescription was added. Make sure you understand how and when to take each.   levothyroxine 100 MCG tablet Commonly known as:  SYNTHROID, LEVOTHROID Take 100 mcg daily by mouth.   losartan 50 MG tablet Commonly known as:  COZAAR Take 50 mg by mouth daily.   methocarbamol 500 MG tablet Commonly known as:  ROBAXIN Take 1 tablet (500 mg total) by mouth every 6 (six) hours as needed for muscle spasms.   naproxen 375 MG tablet Commonly known as:  NAPROSYN Take 1 tablet (375 mg total) by mouth 2 (two) times daily.   omeprazole 20 MG capsule Commonly known as:  PRILOSEC Take 20 mg by mouth daily.   ondansetron 4 MG disintegrating tablet Commonly known as:  ZOFRAN-ODT 57m PO q 6 hrs as needed for nausea   propranolol 20 MG tablet Commonly known as:  INDERAL Take 20 mg by mouth 2 (two) times daily.   traMADol 50 MG tablet Commonly known as:  ULTRAM TAKE 1 TABLET BY MOUTH EVERY 6 HOURS AS NEEDED            Durable Medical Equipment  (From admission, onward)  Start     Ordered   03/15/17 1849  DME Walker rolling  Once    Question:  Patient needs a walker to treat with the following condition  Answer:  S/P lumbar spinal fusion   03/15/17 1848   03/15/17 1849  DME 3 n 1  Once     03/15/17 1848      Diagnostic Studies: Dg Lumbar Spine Complete  Result Date: 03/15/2017 CLINICAL DATA:  Intraoperative imaging for L5-S1 discectomy and fusion. EXAM: DG C-ARM 61-120 MIN; LUMBAR SPINE - COMPLETE 4+ VIEW COMPARISON:  MRI lumbar spine 10/06/2016. FINDINGS: Four fluoroscopic intraoperative  spot views of the lumbar spine demonstrate pedicle screws, stabilization bars and interbody spacer in place at L5-S1. There is partial visualization of a left hip replacement and plate and screw fixation of the left acetabulum. IMPRESSION: Intraoperative imaging for L5-S1 fusion.  No acute abnormality. Electronically Signed   By: Inge Rise M.D.   On: 03/15/2017 17:49   Dg C-arm 1-60 Min  Result Date: 03/15/2017 CLINICAL DATA:  Intraoperative imaging for L5-S1 discectomy and fusion. EXAM: DG C-ARM 61-120 MIN; LUMBAR SPINE - COMPLETE 4+ VIEW COMPARISON:  MRI lumbar spine 10/06/2016. FINDINGS: Four fluoroscopic intraoperative spot views of the lumbar spine demonstrate pedicle screws, stabilization bars and interbody spacer in place at L5-S1. There is partial visualization of a left hip replacement and plate and screw fixation of the left acetabulum. IMPRESSION: Intraoperative imaging for L5-S1 fusion.  No acute abnormality. Electronically Signed   By: Inge Rise M.D.   On: 03/15/2017 17:49   Dg C-arm 1-60 Min  Result Date: 03/15/2017 CLINICAL DATA:  Intraoperative imaging for L5-S1 discectomy and fusion. EXAM: DG C-ARM 61-120 MIN; LUMBAR SPINE - COMPLETE 4+ VIEW COMPARISON:  MRI lumbar spine 10/06/2016. FINDINGS: Four fluoroscopic intraoperative spot views of the lumbar spine demonstrate pedicle screws, stabilization bars and interbody spacer in place at L5-S1. There is partial visualization of a left hip replacement and plate and screw fixation of the left acetabulum. IMPRESSION: Intraoperative imaging for L5-S1 fusion.  No acute abnormality. Electronically Signed   By: Inge Rise M.D.   On: 03/15/2017 17:49   Dg C-arm 1-60 Min  Result Date: 03/15/2017 CLINICAL DATA:  Intraoperative imaging for L5-S1 discectomy and fusion. EXAM: DG C-ARM 61-120 MIN; LUMBAR SPINE - COMPLETE 4+ VIEW COMPARISON:  MRI lumbar spine 10/06/2016. FINDINGS: Four fluoroscopic intraoperative spot views of the lumbar  spine demonstrate pedicle screws, stabilization bars and interbody spacer in place at L5-S1. There is partial visualization of a left hip replacement and plate and screw fixation of the left acetabulum. IMPRESSION: Intraoperative imaging for L5-S1 fusion.  No acute abnormality. Electronically Signed   By: Inge Rise M.D.   On: 03/15/2017 17:49    Disposition: 01-Home or Self Care  Discharge Instructions    Call MD / Call 911   Complete by:  As directed    If you experience chest pain or shortness of breath, CALL 911 and be transported to the hospital emergency room.  If you develope a fever above 101 F, pus (white drainage) or increased drainage or redness at the wound, or calf pain, call your surgeon's office.   Constipation Prevention   Complete by:  As directed    Drink plenty of fluids.  Prune juice may be helpful.  You may use a stool softener, such as Colace (over the counter) 100 mg twice a day.  Use MiraLax (over the counter) for constipation as needed.   Diet -  low sodium heart healthy   Complete by:  As directed    Discharge instructions   Complete by:  As directed    Call if there is increasing drainage, fever greater than 101.5, severe head aches, and worsening nausea or light sensitivity. If shortness of breath, bloody cough or chest tightness or pain go to an emergency room. No lifting greater than 10 lbs. Avoid bending, stooping and twisting. Use brace when sitting and out of bed even to go to bathroom. Walk in house for first 2 weeks then may start to get out slowly increasing distances up to one mile by 4-6 weeks post op. After 5 days may shower and change dressing following bathing with shower.When bathing remove the brace shower and replace brace before getting out of the shower. If drainage, keep dry dressing and do not bathe the incision, use an moisture impervious dressing. Please call and return for scheduled follow up appointment 2 weeks from the time of  surgery.   Driving restrictions   Complete by:  As directed    No driving for 4 weeks   Increase activity slowly as tolerated   Complete by:  As directed    Lifting restrictions   Complete by:  As directed    No lifting for 8 weeks      Follow-up Information    Jessy Oto, MD In 2 weeks.   Specialty:  Orthopedic Surgery Why:  For wound re-check Contact information: [place] [address] [phone]            Signed: [name] 03/18/2017, 8:34 AM

## 2017-03-18 NOTE — Progress Notes (Signed)
Pt being discharged from hospital per orders from MD. Pt and family aware of discharge. Pt's IV removed prior to discharge. Pt exited hospital via stretcher. RN called and gave report to [facility] at [facility] in [place].

## 2017-03-18 NOTE — Progress Notes (Signed)
     Subjective: 3 Days Post-Op Procedure(s) (LRB): RIGHT L4-5 PARTIAL HEMILAMINECTOMY, RIGHT L5-S1 TRANSFORAMINAL LUMBAR INTERBODY FUSION WITH PEDICLE SCREWS WITH CEMENT, RODS, CAGES, LOCAL BONE GRAFT, VIVIGEN CANCELLOUS CHIPS (N/A) Awake alert and oriented x 4. I'm ready to go. Walked in hallway this AM. H/H stable Hgb 8.4 on iron. No BM as yet.   Patient reports pain as moderate.    Objective:   VITALS:  Temp:  [99 F (37.2 C)-99.5 F (37.5 C)] 99.2 F (37.3 C) (01/11 0439) Pulse Rate:  [74-83] 80 (01/11 0439) Resp:  [16-18] 16 (01/11 0439) BP: (112-128)/(41-67) 112/67 (01/11 0439) SpO2:  [91 %-97 %] 92 % (01/11 0439)  Neurologically intact ABD soft Neurovascular intact Sensation intact distally Intact pulses distally Dorsiflexion/Plantar flexion intact Incision: no drainage No cellulitis present   LABS Recent Labs    03/16/17 0500 03/16/17 2136 03/17/17 1828  HGB 8.0* 8.3* 8.4*  WBC 9.3  --  9.2  PLT 234  --  234   Recent Labs    03/16/17 0500  NA 138  K 3.8  CL 102  CO2 26  BUN 18  CREATININE 1.16*  GLUCOSE 110*   No results for input(s): LABPT, INR in the last 72 hours.   Assessment/Plan: 3 Days Post-Op Procedure(s) (LRB): RIGHT L4-5 PARTIAL HEMILAMINECTOMY, RIGHT L5-S1 TRANSFORAMINAL LUMBAR INTERBODY FUSION WITH PEDICLE SCREWS WITH CEMENT, RODS, CAGES, LOCAL BONE GRAFT, VIVIGEN CANCELLOUS CHIPS (N/A)  Advance diet Up with therapy Discharge to SNF  Discharge summary done. Meds on chart for discharge. Iron for 1 month.  [name] 03/18/2017, 8:27 AMPatient ID: Summer Nielsen, female   DOB: 05-Jun-1927, 84 y.o.   MRN: [id number]

## 2017-03-18 NOTE — Care Management Note (Signed)
Case Management Note  Patient Details  Name: Summer Nielsen MRN: [id number] Date of Birth: [birth year]  Subjective/Objective:                    Action/Plan: Pt discharging to [facility] today. No further needs per CM.  Expected Discharge Date:  03/18/17               Expected Discharge Plan:  Skilled Nursing Facility  In-House Referral:  Clinical Social Work  Discharge planning Services  CM Consult  Post Acute Care Choice:  Durable Medical Equipment Choice offered to:     DME Arranged:    DME Agency:     HH Arranged:    [place] Agency:     Status of Service:  Completed, signed off  If discussed at [employer], dates discussed:    Additional Comments:  Pollie Friar, RN 03/18/2017, 11:10 AM

## 2017-03-18 NOTE — Clinical Social Work Note (Signed)
Clinical Social Worker facilitated patient discharge including contacting patient family and facility to confirm patient discharge plans.  Clinical information faxed to facility and family agreeable with plan.  CSW arranged ambulance transport via [facility] to [facility] .  RN to call [phone] for report prior to discharge. Patient will go to room 206.  Clinical Social Worker will sign off for now as social work intervention is no longer needed. Please consult [place] again if new need arises.  [place], [place]

## 2017-03-18 NOTE — Clinical Social Work Placement (Signed)
CLINICAL SOCIAL WORK PLACEMENT  NOTE  Date:  03/18/2017  Patient Details  Name: Summer Nielsen MRN: [id number] Date of Birth: 1927/12/30  Clinical Social Work is seeking post-discharge placement for this patient at the [place] level of care (*CSW will initial, date and re-position this form in  chart as items are completed):      Patient/family provided with [place] Work Department's list of facilities offering this level of care within the geographic area requested by the patient (or if unable, by the patient's family).  Yes   Patient/family informed of their freedom to choose among providers that offer the needed level of care, that participate in Medicare, Medicaid or managed care program needed by the patient, have an available bed and are willing to accept the patient.      Patient/family informed of [facility]'s ownership interest in [facility] and [facility], as well as of the fact that they are under no obligation to receive care at these facilities.  PASRR submitted to EDS on       PASRR number received on 03/17/17     Existing PASRR number confirmed on       FL2 transmitted to all facilities in geographic area requested by pt/family on 03/17/17     FL2 transmitted to all facilities within larger geographic area on       Patient informed that his/her managed care company has contracts with or will negotiate with certain facilities, including the following:        Yes   Patient/family informed of bed offers received.  Patient chooses bed at [place], [place]     Physician recommends and patient chooses bed at      Patient to be transferred to [facility] on 03/18/17.  Patient to be transferred to facility by [facility]     Patient family notified on 03/18/17 of transfer.  Name of family member notified:  Ronalee Belts     PHYSICIAN Please prepare prescriptions     Additional Comment:     _______________________________________________ Eileen Stanford, LCSW 03/18/2017, 11:23 AM

## 2017-03-21 ENCOUNTER — Encounter (INDEPENDENT_AMBULATORY_CARE_PROVIDER_SITE_OTHER): Payer: Self-pay | Admitting: Specialist

## 2017-03-21 ENCOUNTER — Ambulatory Visit (INDEPENDENT_AMBULATORY_CARE_PROVIDER_SITE_OTHER): Payer: Medicare Other | Admitting: Specialist

## 2017-03-21 ENCOUNTER — Ambulatory Visit (INDEPENDENT_AMBULATORY_CARE_PROVIDER_SITE_OTHER): Payer: Medicare Other

## 2017-03-21 VITALS — BP 136/58 | HR 65 | Ht 62.0 in | Wt 144.0 lb

## 2017-03-21 DIAGNOSIS — D62 Acute posthemorrhagic anemia: Secondary | ICD-10-CM

## 2017-03-21 DIAGNOSIS — Z981 Arthrodesis status: Secondary | ICD-10-CM

## 2017-03-21 NOTE — Progress Notes (Signed)
Post-Op Visit Note   Patient: Summer Nielsen           Date of Birth: [birth year]           MRN: [id number] Visit Date: 03/21/2017 PCP: Maurice Small, MD   Assessment & Plan: 6 days post L5-S1 fusion with laminectomy L4-5 for stenosis with severe foramenal stenosis L5-S1 with shortened interpedicular distance.  Chief Complaint:  Chief Complaint  Patient presents with  . Lower Back - Routine Post Op, Open Wound  Incision is dry,  No open areas. Left paradorsal area with an area of skin irritation due to adhesive tear or blistering. There is ecchymosis bilateral Lumbosacral areas and paralumbar. Motor is normal and sensory is normal.  Previous Hgb 8.4 at discharge.   Visit Diagnoses:  1. S/P lumbar fusion     Plan:    Call if there is increasing drainage, fever greater than 101.5, severe head aches, and worsening nausea or light sensitivity. If shortness of breath, bloody cough or chest tightness or pain go to an emergency room. No lifting greater than 10 lbs. Avoid bending, stooping and twisting. Use brace when sitting and out of bed even to go to bathroom. Walk in house for first 2 weeks then may start to get out slowly increasing distances up to one quarter mile by 4-6 weeks post op. After 5 days may shower and change dressing following bathing with shower.When bathing remove the brace shower and replace brace before getting out of the shower. If drainage, keep dry dressing and do not bathe the incision, use an moisture impervious dressing. Please call and return for scheduled follow up appointment 2 weeks from the time of surgery.    Follow-Up Instructions: No Follow-up on file.   Orders:  Orders Placed This Encounter  Procedures  . XR Lumbar Spine 2-3 Views   No orders of the defined types were placed in this encounter.   Imaging: No results found.  PMFS History: Patient Active Problem List   Diagnosis Date Noted  . Anemia due to acute blood loss 03/17/2017      Priority: High    Class: Acute  . Other secondary scoliosis, lumbar region 03/16/2017    Priority: High    Class: Chronic  . Spinal stenosis of lumbar region 03/15/2017    Priority: High    Class: Chronic  . Status post lumbar spinal fusion 03/15/2017  . Closed displaced fracture of base of fifth metacarpal bone of left hand 10/12/2016  . Lumbar radiculopathy 08/26/2016  . Iron deficiency anemia 05/20/2016  . Chronic right shoulder pain 02/16/2016  . Acute diastolic heart failure ([place]) 10/30/2015  . Secondary pulmonary hypertension 10/30/2015  . Hypertensive heart disease with heart failure ([place]) 10/30/2015  . TIA (transient ischemic attack)   . 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 Postsurgical hypothyroidism   . OP (osteoporosis)   . Hyperlipidemia   . High blood pressure   . Glaucoma   . CHF (congestive heart failure) (HCC)    Past Medical History:  Diagnosis Date  . Cancer ([place])    squamous cell skin cancer on left leg-removed  . CHF (congestive heart failure) ([place])   . Complication of anesthesia   . GERD (gastroesophageal reflux disease)   . Glaucoma   . High blood pressure   . Hyperlipidemia   . Hypothyroidism   . OP (osteoporosis)   . PONV (postoperative nausea and vomiting)   . Postsurgical hypothyroidism   .  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 Tachycardia   . TIA (transient ischemic attack)     Family History  Problem Relation Age of Onset  . Heart attack Mother   . Cerebral aneurysm Mother   . Stroke Mother   . Hypertension Mother   . Heart attack Father   . Heart disease Sister   . Heart disease Brother     Past Surgical History:  Procedure Laterality Date  . ABDOMINAL HYSTERECTOMY    . CHOLECYSTECTOMY    . EYE SURGERY     removed cateracts on both eyes  . JOINT REPLACEMENT     3 hip replacement on left, right knee replacement  . THYROIDECTOMY     Social History    Occupational History  . Not on file  Tobacco Use  . Smoking status: Never Smoker  . Smokeless tobacco: Never Used  Substance and Sexual Activity  . Alcohol use: No  . Drug use: No  . Sexual activity: Not on file

## 2017-03-22 LAB — CBC WITH DIFFERENTIAL/PLATELET
BASOS ABS: 49 {cells}/uL (ref 0–200)
Basophils Relative: 0.5 %
EOS ABS: 490 {cells}/uL (ref 15–500)
Eosinophils Relative: 5 %
HEMATOCRIT: 27.4 % — AB (ref 35.0–45.0)
HEMOGLOBIN: 9.1 g/dL — AB (ref 11.7–15.5)
LYMPHS ABS: 1686 {cells}/uL (ref 850–3900)
MCH: 30.7 pg (ref 27.0–33.0)
MCHC: 33.2 g/dL (ref 32.0–36.0)
MCV: 92.6 fL (ref 80.0–100.0)
MPV: 10.6 fL (ref 7.5–12.5)
Monocytes Relative: 14.1 %
NEUTROS ABS: 6194 {cells}/uL (ref 1500–7800)
Neutrophils Relative %: 63.2 %
Platelets: 359 10*3/uL (ref 140–400)
RBC: 2.96 10*6/uL — ABNORMAL LOW (ref 3.80–5.10)
RDW: 12.7 % (ref 11.0–15.0)
Total Lymphocyte: 17.2 %
WBC mixed population: 1382 cells/uL — ABNORMAL HIGH (ref 200–950)
WBC: 9.8 10*3/uL (ref 3.8–10.8)

## 2017-03-22 LAB — TIQ-NTM

## 2017-03-31 ENCOUNTER — Ambulatory Visit (INDEPENDENT_AMBULATORY_CARE_PROVIDER_SITE_OTHER): Payer: Medicare Other | Admitting: Surgery

## 2017-03-31 ENCOUNTER — Ambulatory Visit (INDEPENDENT_AMBULATORY_CARE_PROVIDER_SITE_OTHER): Payer: Medicare Other

## 2017-03-31 ENCOUNTER — Encounter (INDEPENDENT_AMBULATORY_CARE_PROVIDER_SITE_OTHER): Payer: Self-pay | Admitting: Surgery

## 2017-03-31 DIAGNOSIS — Z981 Arthrodesis status: Secondary | ICD-10-CM

## 2017-03-31 DIAGNOSIS — R5383 Other fatigue: Secondary | ICD-10-CM

## 2017-03-31 NOTE — Progress Notes (Signed)
Post-Op Visit Note   Patient: Summer Nielsen           Date of Birth: 1927-12-12           MRN: [id number] Visit Date: 03/31/2017 PCP: Maurice Small, MD   Assessment & Plan:  Chief Complaint:  Chief Complaint  Patient presents with  . Lower Back - Routine Post Op  Patient returns for recheck. Status post right L4-5 partial hemilaminectomy, right L5-S1 transforaminal lumbar interbody fusion 03/15/2017 returns. States that her back is doing well. Occasionally has some right leg pain better than preop symptoms. She states that she continues to have fatigue and gets tired fast.    Visit Diagnoses:  1. Status post lumbar spinal fusion   2. Fatigue, unspecified type     Plan: With patient's ongoing symptoms recommend getting blood work to check CBC and complete metabolic profile. We'll follow-up with Dr. Louanne Skye in a couple weeks for recheck. The patient's symptoms worsen she should go to the emergency room. I did speak with her son and husband in the office today and advised him to keep a close eye on her to make sure that she is not having any issues.    Follow-Up Instructions: Return in about 2 weeks (around 04/14/2017) for Dr Louanne Skye.   Orders:  Orders Placed This Encounter  Procedures  . CBC  . Comprehensive Metabolic Panel (CMET)   No orders of the defined types were placed in this encounter.   Imaging: No results found.  PMFS History: Patient Active Problem List   Diagnosis Date Noted  . Anemia due to acute blood loss 03/17/2017    Class: Acute  . Other secondary scoliosis, lumbar region 03/16/2017    Class: Chronic  . Spinal stenosis of lumbar region 03/15/2017    Class: Chronic  . Status post lumbar spinal fusion 03/15/2017  . Closed displaced fracture of base of fifth metacarpal bone of left hand 10/12/2016  . Lumbar radiculopathy 08/26/2016  . Iron deficiency anemia 05/20/2016  . Chronic right shoulder pain 02/16/2016  . Acute diastolic heart failure ([place])  10/30/2015  . Secondary pulmonary hypertension 10/30/2015  . Hypertensive heart disease with heart failure ([place]) 10/30/2015  . TIA (transient ischemic attack)   . 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 Postsurgical hypothyroidism   . OP (osteoporosis)   . Hyperlipidemia   . High blood pressure   . Glaucoma   . CHF (congestive heart failure) (HCC)    Past Medical History:  Diagnosis Date  . Cancer ([place])    squamous cell skin cancer on left leg-removed  . CHF (congestive heart failure) ([place])   . Complication of anesthesia   . GERD (gastroesophageal reflux disease)   . Glaucoma   . High blood pressure   . Hyperlipidemia   . Hypothyroidism   . OP (osteoporosis)   . PONV (postoperative nausea and vomiting)   . Postsurgical hypothyroidism   . 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 Tachycardia   . TIA (transient ischemic attack)     Family History  Problem Relation Age of Onset  . Heart attack Mother   . Cerebral aneurysm Mother   . Stroke Mother   . Hypertension Mother   . Heart attack Father   . Heart disease Sister   . Heart disease Brother     Past Surgical History:  Procedure Laterality Date  . ABDOMINAL HYSTERECTOMY    . CHOLECYSTECTOMY    .  EYE SURGERY     removed cateracts on both eyes  . JOINT REPLACEMENT     3 hip replacement on left, right knee replacement  . THYROIDECTOMY     Social History   Occupational History  . Not on file  Tobacco Use  . Smoking status: Never Smoker  . Smokeless tobacco: Never Used  Substance and Sexual Activity  . Alcohol use: No  . Drug use: No  . Sexual activity: Not on file

## 2017-04-01 ENCOUNTER — Telehealth (INDEPENDENT_AMBULATORY_CARE_PROVIDER_SITE_OTHER): Payer: Self-pay | Admitting: Specialist

## 2017-04-01 LAB — CBC
HCT: 29.6 % — ABNORMAL LOW (ref 35.0–45.0)
HEMOGLOBIN: 9.7 g/dL — AB (ref 11.7–15.5)
MCH: 30.2 pg (ref 27.0–33.0)
MCHC: 32.8 g/dL (ref 32.0–36.0)
MCV: 92.2 fL (ref 80.0–100.0)
MPV: 10.7 fL (ref 7.5–12.5)
PLATELETS: 482 10*3/uL — AB (ref 140–400)
RBC: 3.21 10*6/uL — AB (ref 3.80–5.10)
RDW: 13.1 % (ref 11.0–15.0)
WBC: 8.6 10*3/uL (ref 3.8–10.8)

## 2017-04-01 LAB — COMPREHENSIVE METABOLIC PANEL
AG Ratio: 1.4 (calc) (ref 1.0–2.5)
ALKALINE PHOSPHATASE (APISO): 66 U/L (ref 33–130)
ALT: 9 U/L (ref 6–29)
AST: 15 U/L (ref 10–35)
Albumin: 3.9 g/dL (ref 3.6–5.1)
BUN/Creatinine Ratio: 24 (calc) — ABNORMAL HIGH (ref 6–22)
BUN: 32 mg/dL — ABNORMAL HIGH (ref 7–25)
CHLORIDE: 101 mmol/L (ref 98–110)
CO2: 30 mmol/L (ref 20–32)
CREATININE: 1.32 mg/dL — AB (ref 0.60–0.88)
Calcium: 9.6 mg/dL (ref 8.6–10.4)
GLOBULIN: 2.7 g/dL (ref 1.9–3.7)
GLUCOSE: 98 mg/dL (ref 65–99)
Potassium: 4.4 mmol/L (ref 3.5–5.3)
Sodium: 141 mmol/L (ref 135–146)
Total Bilirubin: 0.4 mg/dL (ref 0.2–1.2)
Total Protein: 6.6 g/dL (ref 6.1–8.1)

## 2017-04-01 NOTE — Telephone Encounter (Signed)
Newell Coral, PT with [facility] needs verbal orders for patient:   1 x for 1 week 3 x for 2 weeks 5 x for 2 weeks  CB # [id number]

## 2017-04-04 ENCOUNTER — Telehealth (INDEPENDENT_AMBULATORY_CARE_PROVIDER_SITE_OTHER): Payer: Self-pay | Admitting: Radiology

## 2017-04-04 NOTE — Telephone Encounter (Signed)
I called and lmom giving verbal ok for this order

## 2017-04-04 NOTE — Telephone Encounter (Signed)
Patient is calling requesting her lab results. Please advise

## 2017-04-05 ENCOUNTER — Telehealth (INDEPENDENT_AMBULATORY_CARE_PROVIDER_SITE_OTHER): Payer: Self-pay | Admitting: Surgery

## 2017-04-05 NOTE — Telephone Encounter (Signed)
Error, message already in system

## 2017-04-11 ENCOUNTER — Other Ambulatory Visit (INDEPENDENT_AMBULATORY_CARE_PROVIDER_SITE_OTHER): Payer: Self-pay | Admitting: Specialist

## 2017-04-11 MED ORDER — FERROUS GLUCONATE 324 (38 FE) MG PO TABS: 324.0000 mg | ORAL_TABLET | Freq: Every day | ORAL | 0 refills | Status: DC

## 2017-04-14 ENCOUNTER — Ambulatory Visit (INDEPENDENT_AMBULATORY_CARE_PROVIDER_SITE_OTHER): Payer: Medicare Other | Admitting: Specialist

## 2017-04-14 ENCOUNTER — Ambulatory Visit (INDEPENDENT_AMBULATORY_CARE_PROVIDER_SITE_OTHER): Payer: Medicare Other

## 2017-04-14 ENCOUNTER — Inpatient Hospital Stay (INDEPENDENT_AMBULATORY_CARE_PROVIDER_SITE_OTHER): Payer: Medicare Other | Admitting: Specialist

## 2017-04-14 ENCOUNTER — Encounter (INDEPENDENT_AMBULATORY_CARE_PROVIDER_SITE_OTHER): Payer: Self-pay | Admitting: Specialist

## 2017-04-14 VITALS — BP 147/63 | HR 64 | Ht 62.0 in | Wt 144.0 lb

## 2017-04-14 DIAGNOSIS — Z981 Arthrodesis status: Secondary | ICD-10-CM

## 2017-04-14 MED ORDER — FUSION PLUS PO CAPS: 1.0000 | ORAL_CAPSULE | Freq: Every day | ORAL | Status: AC

## 2017-04-14 NOTE — Patient Instructions (Signed)
Avoid frequent bending and stooping  No lifting greater than 10 lbs. May use ice or moist heat for pain. Weight loss is of benefit. Handicap license is approved.   

## 2017-04-14 NOTE — Progress Notes (Signed)
Post-Op Visit Note   Patient: Summer Nielsen           Date of Birth: 07-11-1927           MRN: [id number] Visit Date: 04/14/2017 PCP: Maurice Small, MD   Assessment & Plan:L5-S1 TLIF  And decompressive laminectoy L4-5,  4 weeks post op. Doing well  Chief Complaint:  Chief Complaint  Patient presents with  . Lower Back - Routine Post Op, Wound Check, Follow-up  Incision is healed and she may discontinued the bandaid. Motor iis normal. Plain radiographs are with out acute changes hardware in good condition, old compression fractures L4 and at T12. Visit Diagnoses:  1. Status post lumbar spinal fusion     Plan: Avoid frequent bending and stooping  No lifting greater than 10 lbs. May use ice or moist heat for pain. Weight loss is of benefit. Handicap license is approved.  Follow-Up Instructions: No Follow-up on file.   Orders:  Orders Placed This Encounter  Procedures  . XR Lumbar Spine 2-3 Views   No orders of the defined types were placed in this encounter.   Imaging: Xr Lumbar Spine 2-3 Views  Result Date: 04/14/2017 AP and lateral of the lumbar spine with cage L5-S1 with pedicle screws and rods L5-S1 cemmented into place, no abnormalities noted.    PMFS History: Patient Active Problem List   Diagnosis Date Noted  . Anemia due to acute blood loss 03/17/2017    Priority: High    Class: Acute  . Other secondary scoliosis, lumbar region 03/16/2017    Priority: High    Class: Chronic  . Spinal stenosis of lumbar region 03/15/2017    Priority: High    Class: Chronic  . Status post lumbar spinal fusion 03/15/2017  . Closed displaced fracture of base of fifth metacarpal bone of left hand 10/12/2016  . Lumbar radiculopathy 08/26/2016  . Iron deficiency anemia 05/20/2016  . Chronic right shoulder pain 02/16/2016  . Acute diastolic heart failure ([place]) 10/30/2015  . Secondary pulmonary hypertension 10/30/2015  . Hypertensive heart disease with heart failure ([place])  10/30/2015  . TIA (transient ischemic attack)   . 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 Postsurgical hypothyroidism   . OP (osteoporosis)   . Hyperlipidemia   . High blood pressure   . Glaucoma   . CHF (congestive heart failure) (HCC)    Past Medical History:  Diagnosis Date  . Cancer ([place])    squamous cell skin cancer on left leg-removed  . CHF (congestive heart failure) ([place])   . Complication of anesthesia   . GERD (gastroesophageal reflux disease)   . Glaucoma   . High blood pressure   . Hyperlipidemia   . Hypothyroidism   . OP (osteoporosis)   . PONV (postoperative nausea and vomiting)   . Postsurgical hypothyroidism   . 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 Tachycardia   . TIA (transient ischemic attack)     Family History  Problem Relation Age of Onset  . Heart attack Mother   . Cerebral aneurysm Mother   . Stroke Mother   . Hypertension Mother   . Heart attack Father   . Heart disease Sister   . Heart disease Brother     Past Surgical History:  Procedure Laterality Date  . ABDOMINAL HYSTERECTOMY    . CHOLECYSTECTOMY    . EYE SURGERY     removed cateracts on both eyes  . JOINT  REPLACEMENT     3 hip replacement on left, right knee replacement  . THYROIDECTOMY     Social History   Occupational History  . Not on file  Tobacco Use  . Smoking status: Never Smoker  . Smokeless tobacco: Never Used  Substance and Sexual Activity  . Alcohol use: No  . Drug use: No  . Sexual activity: Not on file

## 2017-06-02 ENCOUNTER — Ambulatory Visit (INDEPENDENT_AMBULATORY_CARE_PROVIDER_SITE_OTHER): Payer: Medicare Other

## 2017-06-02 ENCOUNTER — Ambulatory Visit (INDEPENDENT_AMBULATORY_CARE_PROVIDER_SITE_OTHER): Payer: Medicare Other | Admitting: Specialist

## 2017-06-02 ENCOUNTER — Encounter (INDEPENDENT_AMBULATORY_CARE_PROVIDER_SITE_OTHER): Payer: Self-pay | Admitting: Specialist

## 2017-06-02 VITALS — BP 149/65 | HR 64 | Ht 62.0 in | Wt 144.0 lb

## 2017-06-02 DIAGNOSIS — Z981 Arthrodesis status: Secondary | ICD-10-CM | POA: Diagnosis not present

## 2017-06-02 NOTE — Progress Notes (Signed)
Post-Op Visit Note   Patient: Summer Nielsen           Date of Birth: 1927/06/30           MRN: [id number] Visit Date: 06/02/2017 PCP: Maurice Small, MD   Assessment & Plan:  Chief Complaint: 2 3/4 month post decompression and fusion L5-S1.  Chief Complaint  Patient presents with  . Lower Back - Routine Post Op   Visit Diagnoses:  1. Status post lumbar spinal fusion   Legs NV intact except left hip flexion weakness that is chronic due to left hip dysplasia post THR. Radiographs with less than 1.0 mm of motion between similar points with flexion and extension. Plan:Avoid frequent bending and stooping  No lifting greater than 10 lbs. May use ice or moist heat for pain. Weight loss is of benefit. Handicap license is approved. May wean from brace wear half days for 2 weeks Then discontinue the brace.  Walking for exercise.     Follow-Up Instructions: No follow-ups on file.   Orders:  Orders Placed This Encounter  Procedures  . XR Lumbar Spine 2-3 Views   No orders of the defined types were placed in this encounter.   Imaging: No results found.  PMFS History: Patient Active Problem List   Diagnosis Date Noted  . Anemia due to acute blood loss 03/17/2017    Priority: High    Class: Acute  . Other secondary scoliosis, lumbar region 03/16/2017    Priority: High    Class: Chronic  . Spinal stenosis of lumbar region 03/15/2017    Priority: High    Class: Chronic  . Status post lumbar spinal fusion 03/15/2017  . Closed displaced fracture of base of fifth metacarpal bone of left hand 10/12/2016  . Lumbar radiculopathy 08/26/2016  . Iron deficiency anemia 05/20/2016  . Chronic right shoulder pain 02/16/2016  . Acute diastolic heart failure ([place]) 10/30/2015  . Secondary pulmonary hypertension 10/30/2015  . Hypertensive heart disease with heart failure ([place]) 10/30/2015  . TIA (transient ischemic attack)   . Spondylosis of cervical region without myelopathy or  radiculopathy   . Postsurgical hypothyroidism   . OP (osteoporosis)   . Hyperlipidemia   . High blood pressure   . Glaucoma   . CHF (congestive heart failure) (HCC)    Past Medical History:  Diagnosis Date  . Cancer ([place])    squamous cell skin cancer on left leg-removed  . CHF (congestive heart failure) ([place])   . Complication of anesthesia   . GERD (gastroesophageal reflux disease)   . Glaucoma   . High blood pressure   . Hyperlipidemia   . Hypothyroidism   . OP (osteoporosis)   . PONV (postoperative nausea and vomiting)   . Postsurgical hypothyroidism   . 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 Tachycardia   . TIA (transient ischemic attack)     Family History  Problem Relation Age of Onset  . Heart attack Mother   . Cerebral aneurysm Mother   . Stroke Mother   . Hypertension Mother   . Heart attack Father   . Heart disease Sister   . Heart disease Brother     Past Surgical History:  Procedure Laterality Date  . ABDOMINAL HYSTERECTOMY    . CHOLECYSTECTOMY    . EYE SURGERY     removed cateracts on both eyes  . JOINT REPLACEMENT     3 hip replacement on left, right knee replacement  . THYROIDECTOMY  Social History   Occupational History  . Not on file  Tobacco Use  . Smoking status: Never Smoker  . Smokeless tobacco: Never Used  Substance and Sexual Activity  . Alcohol use: No  . Drug use: No  . Sexual activity: Not on file

## 2017-06-02 NOTE — Patient Instructions (Signed)
Purewick placed on pt.   Avoid frequent bending and stooping  No lifting greater than 10 lbs. May use ice or moist heat for pain. Weight loss is of benefit. Handicap license is approved. May wean from brace wear half days for 2 weeks Then discontinue the brace.  Walking for exercise.

## 2017-06-07 ENCOUNTER — Encounter (INDEPENDENT_AMBULATORY_CARE_PROVIDER_SITE_OTHER): Payer: Self-pay | Admitting: Specialist

## 2017-08-04 ENCOUNTER — Telehealth: Payer: Self-pay | Admitting: Cardiology

## 2017-08-04 NOTE — Telephone Encounter (Signed)
Spoke with patient who is c/o some increased swelling more recently over the past several weeks.  The increased SOB occurs with walking, making her bed, etc..  She has not been weighing daily and I advised her to do so, keeping a log of her daily wts.  She has had some occasional edema but it's "not bad."  Pt has been scheduled to be seen on Monday and will keep that appt.  Advised to limit NA+ and fluid intake and elevate feet and legs if any edema noted.  She states understanding and is in agreement.

## 2017-08-04 NOTE — Telephone Encounter (Signed)
New message    Pt is calling about her SOB. She asked for appt, scheduled with Cecille Rubin on Monday at [facility].  Pt c/o Shortness Of Breath: STAT if SOB developed within the last 24 hours or pt is noticeably SOB on the phone  1. Are you currently SOB (can you hear that pt is SOB on the phone)? Yes, can not hear on phone  2. How long have you been experiencing SOB? For a while- 3 months.   3. Are you SOB when sitting or when up moving around? Moving around  4. Are you currently experiencing any other symptoms? No

## 2017-08-08 ENCOUNTER — Ambulatory Visit: Payer: Medicare Other | Admitting: Nurse Practitioner

## 2017-08-08 ENCOUNTER — Ambulatory Visit
Admission: RE | Admit: 2017-08-08 | Discharge: 2017-08-08 | Disposition: A | Discharge status: Home / Self Care | Payer: Medicare Other | Source: Ambulatory Visit | Attending: Nurse Practitioner | Admitting: Nurse Practitioner

## 2017-08-08 ENCOUNTER — Encounter: Payer: Self-pay | Admitting: Nurse Practitioner

## 2017-08-08 VITALS — BP 128/66 | HR 62 | Ht 62.0 in | Wt 145.8 lb

## 2017-08-08 DIAGNOSIS — I5032 Chronic diastolic (congestive) heart failure: Secondary | ICD-10-CM | POA: Diagnosis not present

## 2017-08-08 DIAGNOSIS — I10 Essential (primary) hypertension: Secondary | ICD-10-CM

## 2017-08-08 DIAGNOSIS — R0600 Dyspnea, unspecified: Secondary | ICD-10-CM

## 2017-08-08 LAB — CBC
Hematocrit: 35.7 % (ref 34.0–46.6)
Hemoglobin: 12.1 g/dL (ref 11.1–15.9)
MCH: 32.2 pg (ref 26.6–33.0)
MCHC: 33.9 g/dL (ref 31.5–35.7)
MCV: 95 fL (ref 79–97)
Platelets: 319 10*3/uL (ref 150–450)
RBC: 3.76 x10E6/uL — ABNORMAL LOW (ref 3.77–5.28)
RDW: 14 % (ref 12.3–15.4)
WBC: 7.2 10*3/uL (ref 3.4–10.8)

## 2017-08-08 LAB — TSH: TSH: 9.9 u[IU]/mL — ABNORMAL HIGH (ref 0.450–4.500)

## 2017-08-08 LAB — BASIC METABOLIC PANEL
BUN/Creatinine Ratio: 18 (ref 12–28)
BUN: 20 mg/dL (ref 8–27)
CO2: 26 mmol/L (ref 20–29)
Calcium: 9.8 mg/dL (ref 8.7–10.3)
Chloride: 101 mmol/L (ref 96–106)
Creatinine, Ser: 1.1 mg/dL — ABNORMAL HIGH (ref 0.57–1.00)
GFR calc Af Amer: 52 mL/min/{1.73_m2} — ABNORMAL LOW (ref >59)
GFR calc non Af Amer: 45 mL/min/{1.73_m2} — ABNORMAL LOW (ref >59)
Glucose: 105 mg/dL — ABNORMAL HIGH (ref 65–99)
Potassium: 4.4 mmol/L (ref 3.5–5.2)
Sodium: 141 mmol/L (ref 134–144)

## 2017-08-08 LAB — HEPATIC FUNCTION PANEL
ALT: 15 IU/L (ref 0–32)
AST: 16 IU/L (ref 0–40)
Albumin: 4.4 g/dL (ref 3.5–4.7)
Alkaline Phosphatase: 45 IU/L (ref 39–117)
Bilirubin Total: 0.4 mg/dL (ref 0.0–1.2)
Bilirubin, Direct: 0.12 mg/dL (ref 0.00–0.40)
Total Protein: 7 g/dL (ref 6.0–8.5)

## 2017-08-08 LAB — PRO B NATRIURETIC PEPTIDE: NT-Pro BNP: 684 pg/mL (ref 0–738)

## 2017-08-08 NOTE — Patient Instructions (Addendum)
We will be checking the following labs today - BMET, CBC, HPF, TSH and BNP  Please go to [facility] to [facility] [place] on the first floor for a chest Xray - you may walk in.     Medication Instructions:    Continue with your current medicines for now.      Testing/Procedures To Be Arranged:  N/A  Follow-Up:   Will see what your tests look like and then decide about our next step.     Other Special Instructions:   N/A    If you need a refill on your cardiac medications before your next appointment, please call your pharmacy.   Call the [place] office at [phone] if you have any questions, problems or concerns.

## 2017-08-08 NOTE — Progress Notes (Signed)
CARDIOLOGY OFFICE NOTE  Date:  08/08/2017    Summer Nielsen Date of Birth: 01-02-1928 Medical Record #[id number]  PCP:  Maurice Small, MD  Cardiologist:  [facility]  Chief Complaint  Patient presents with  . Congestive Heart Failure    Work in visit - seen for Dr. Marlou Porch    History of Present Illness: Summer Nielsen is a 84 y.o. female who presents today for a work in visit. Seen for Dr. Marlou Porch.   She has a history of chronic diastolic HF, grade 2 DD on echo. She has had bradycardia with beta blocker therapy.  Other issues include a history of HTN, chronic diastolic CHF with secondary pulmonary HTN, HLD and prior TIA.    In July 2017, she began having more shortness of breath and was diagnosed originally with pneumonia. No fever, no chills. Treated with antibiotic course. She never improved that much. Dr. Justin Mend checked an echocardiogram which showed grade 2 diastolic dysfunction and BNP which was 200. She placed her on Lasix 40 mg once a day. Her lower extremity edema has improved.   Last seen back in November - felt to be doing ok.   Phone call last week - Spoke with patient who is c/o some increased swelling more recently over the past several weeks.  The increased SOB occurs with walking, making her bed, etc..  She has not been weighing daily and I advised her to do so, keeping a log of her daily wts.  She has had some occasional edema but it's "not bad."  Pt has been scheduled to be seen on Monday and will keep that appt.  Advised to limit NA+ and fluid intake and elevate feet and legs if any edema noted.  She states understanding and is in agreement.   Thus added to my schedule for today.   Comes in today. Here with her husband - Summer Nielsen. She notes that her breathing is getting worse over the past several months - gets short of breath with minimal activity - even with making the bed. She is fine at rest. Her weight has been fairly stable. No cough. No fever. No real swelling. She  has been tracing her weight at home and it has been stable there as well. They do eat out quite frequently - lots of [employer]. She is worried about her anemia - apparently has been as low as hemoglobin of 6 - no real clear cut etiology found apparently per her report. She remains on iron. She is also on chronic NSAID.   Past Medical History:  Diagnosis Date  . Cancer ([place])    squamous cell skin cancer on left leg-removed  . CHF (congestive heart failure) ([place])   . Complication of anesthesia   . GERD (gastroesophageal reflux disease)   . Glaucoma   . High blood pressure   . Hyperlipidemia   . Hypothyroidism   . OP (osteoporosis)   . PONV (postoperative nausea and vomiting)   . Postsurgical hypothyroidism   . 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 Tachycardia   . TIA (transient ischemic attack)     Past Surgical History:  Procedure Laterality Date  . ABDOMINAL HYSTERECTOMY    . CHOLECYSTECTOMY    . EYE SURGERY     removed cateracts on both eyes  . JOINT REPLACEMENT     3 hip replacement on left, right knee replacement  . THYROIDECTOMY       Medications: Current Meds  Medication  Sig  . acetaminophen (TYLENOL) 500 MG tablet Take 1,000 mg by mouth daily as needed for moderate pain or headache.  Marland Kitchen amLODipine (NORVASC) 5 MG tablet Take 5 mg by mouth daily.  Marland Kitchen atorvastatin (LIPITOR) 20 MG tablet Take 20 mg by mouth daily.  . bimatoprost (LUMIGAN) 0.01 % SOLN Place 1 drop into both eyes at bedtime.   . calcium-vitamin D (OSCAL WITH D) 500-200 MG-UNIT tablet Take 1 tablet by mouth daily.   . furosemide (LASIX) 40 MG tablet Take 40 mg by mouth 2 (two) times daily.  Marland Kitchen gabapentin (NEURONTIN) 300 MG capsule Take 1 capsule (300 mg total) by mouth 3 (three) times daily.  Marland Kitchen ibuprofen (ADVIL,MOTRIN) 200 MG tablet Take 200 mg by mouth every 6 (six) hours as needed for moderate pain (back surgery).  . Iron-FA-B Cmp-C-Biot-Probiotic (FUSION PLUS) CAPS Take 1  capsule by mouth daily.  Marland Kitchen levothyroxine (SYNTHROID, LEVOTHROID) 100 MCG tablet Take 100 mcg daily by mouth.  . losartan (COZAAR) 50 MG tablet Take 50 mg by mouth daily.  . methocarbamol (ROBAXIN) 500 MG tablet Take 1 tablet (500 mg total) by mouth every 6 (six) hours as needed for muscle spasms.  . Multiple Vitamins-Minerals (CENTRUM SILVER PO) Take 1 tablet by mouth daily.   . naproxen (NAPROSYN) 375 MG tablet Take 1 tablet (375 mg total) by mouth 2 (two) times daily.  Marland Kitchen omeprazole (PRILOSEC) 20 MG capsule Take 20 mg by mouth daily.   . ondansetron (ZOFRAN) 4 MG tablet Take 4 mg by mouth every 6 (six) hours as needed for nausea or vomiting.  . propranolol (INDERAL) 20 MG tablet Take 20 mg by mouth 2 (two) times daily.      Allergies: Allergies  Allergen Reactions  . Levofloxacin Nausea Only    Dizzy, hot, foot pain, insomnia  . Percocet [Oxycodone-Acetaminophen] Nausea And Vomiting and Other (See Comments)    sick    Social History: The patient  reports that she has never smoked. She has never used smokeless tobacco. She reports that she does not drink alcohol or use drugs.   Family History: The patient's family history includes Cerebral aneurysm in her mother; Heart attack in her father and mother; Heart disease in her brother and sister; Hypertension in her mother; Stroke in her mother.   Review of Systems: Please see the history of present illness.   Otherwise, the review of systems is positive for none.   All other systems are reviewed and negative.   Physical Exam: VS:  BP 128/66 (BP Location: Left Arm, Patient Position: Sitting, Cuff Size: Normal)   Pulse 62   Ht _0  (1.575 m)   Wt 145 lb 12.8 oz (66.1 kg)   LMP  (LMP Unknown)   SpO2 90% Comment: walking in than rest 95  BMI 26.67 kg/m  .  BMI Body mass index is 26.67 kg/m.  Wt Readings from Last 3 Encounters:  08/08/17 145 lb 12.8 oz (66.1 kg)  06/02/17 144 lb (65.3 kg)  04/14/17 144 lb (65.3 kg)    General:  Pleasant. She looks younger than her stated age. She is alert and in no acute distress.   HEENT: Normal.  Neck: Supple, no JVD, carotid bruits, or masses noted.  Cardiac: Regular rate and rhythm. No murmurs, rubs, or gallops. No edema.  Respiratory:  She has quite crisp rales - predominately in the right lung. The left lung seems clear. She has normal work of breathing.  GI: Soft and nontender.  MS:  No deformity or atrophy. Gait and ROM intact.  Skin: Warm and dry. Color is normal. Color is not pale.  Neuro:  Strength and sensation are intact and no gross focal deficits noted.  Psych: Alert, appropriate and with normal affect.   LABORATORY DATA:  EKG:  EKG is not ordered today.   Lab Results  Component Value Date   WBC 8.6 03/31/2017   HGB 9.7 (L) 03/31/2017   HCT 29.6 (L) 03/31/2017   PLT 482 (H) 03/31/2017   GLUCOSE 98 03/31/2017   ALT 9 03/31/2017   AST 15 03/31/2017   NA 141 03/31/2017   K 4.4 03/31/2017   CL 101 03/31/2017   CREATININE 1.32 (H) 03/31/2017   BUN 32 (H) 03/31/2017   CO2 30 03/31/2017   INR 0.99 03/14/2017       BNP (last 3 results) No results for input(s): BNP in the last 8760 hours.  ProBNP (last 3 results) No results for input(s): PROBNP in the last 8760 hours.   Other Studies Reviewed Today:  ECHO 10/24/15: - Left ventricle: The cavity size was normal. Systolic function was normal. The estimated ejection fraction was in the range of 60% to 65%. Wall motion was normal; there were no regional wall motion abnormalities. Features are consistent with a pseudonormal left ventricular filling pattern, with concomitant abnormal relaxation and increased filling pressure (grade 2 diastolic dysfunction). - Aortic valve: There was mild regurgitation. - Mitral valve: Calcified annulus. - Pulmonary arteries: Systolic pressure was mildly increased. PA peak pressure: 34 mm Hg (S).   Assessment/Plan:  1. Shortness of breath -  exertional related - does not sound like heart failure - ?lung sounds noted on exam seem more like fibrosis - will get CXR and labs today - further disposition to follow. Lowest oxygen sat noted here today was 90%.   2. Chronic diastolic heart failure - checking lab today. Her weight is stable. No swelling on exam.   3. CKD - checking labs today.   4. Secondary pulmonary hypertension   5. Essential hypertension - BP looks great on current therapy. No changes made today.   6. Bradycardia - no symptoms noted.   7. Anemia - rechecking lab today.   Current medicines are reviewed with the patient today.  The patient does not have concerns regarding medicines other than what has been noted above.  The following changes have been made:  See above.  Labs/ tests ordered today include:    Orders Placed This Encounter  Procedures  . DG Chest 2 View  . Basic metabolic panel  . CBC  . Hepatic function panel  . TSH  . Pro b natriuretic peptide (BNP)     Disposition:   Further disposition pending.   Patient is agreeable to this plan and will call if any problems develop in the interim.   SignedTruitt Merle, NP  08/08/2017 9:55 AM  [facility] [address] [place], [place]  [address] Phone: [phone] Fax: [phone]

## 2017-08-09 ENCOUNTER — Other Ambulatory Visit: Payer: Self-pay | Admitting: *Deleted

## 2017-08-09 DIAGNOSIS — I5031 Acute diastolic (congestive) heart failure: Secondary | ICD-10-CM

## 2017-08-09 DIAGNOSIS — I509 Heart failure, unspecified: Secondary | ICD-10-CM

## 2017-08-16 ENCOUNTER — Other Ambulatory Visit: Payer: Medicare Other

## 2017-08-16 DIAGNOSIS — I5031 Acute diastolic (congestive) heart failure: Secondary | ICD-10-CM

## 2017-08-16 DIAGNOSIS — I509 Heart failure, unspecified: Secondary | ICD-10-CM

## 2017-08-16 LAB — PRO B NATRIURETIC PEPTIDE: NT-Pro BNP: 655 pg/mL (ref 0–738)

## 2017-08-16 LAB — BASIC METABOLIC PANEL
BUN/Creatinine Ratio: 25 (ref 12–28)
BUN: 26 mg/dL (ref 8–27)
CO2: 26 mmol/L (ref 20–29)
Calcium: 9.3 mg/dL (ref 8.7–10.3)
Chloride: 101 mmol/L (ref 96–106)
Creatinine, Ser: 1.05 mg/dL — ABNORMAL HIGH (ref 0.57–1.00)
GFR calc Af Amer: 55 mL/min/{1.73_m2} — ABNORMAL LOW (ref >59)
GFR calc non Af Amer: 48 mL/min/{1.73_m2} — ABNORMAL LOW (ref >59)
Glucose: 94 mg/dL (ref 65–99)
Potassium: 4.4 mmol/L (ref 3.5–5.2)
Sodium: 144 mmol/L (ref 134–144)

## 2017-08-17 ENCOUNTER — Other Ambulatory Visit: Payer: Self-pay | Admitting: *Deleted

## 2017-08-17 DIAGNOSIS — R0602 Shortness of breath: Secondary | ICD-10-CM

## 2017-08-30 ENCOUNTER — Ambulatory Visit: Payer: Medicare Other | Admitting: Internal Medicine

## 2017-08-30 ENCOUNTER — Encounter: Payer: Self-pay | Admitting: Internal Medicine

## 2017-08-30 VITALS — BP 132/74 | HR 73 | Ht 62.0 in | Wt 145.6 lb

## 2017-08-30 DIAGNOSIS — R0609 Other forms of dyspnea: Secondary | ICD-10-CM | POA: Diagnosis not present

## 2017-08-30 MED ORDER — OMEPRAZOLE 20 MG PO CPDR: DELAYED_RELEASE_CAPSULE | ORAL | Status: DC

## 2017-08-30 NOTE — Patient Instructions (Addendum)
Please remember to go to the lab department downstairs in the basement  for your tests - we will call you with the results when they are available.  Avoid all bird exposure   Change prilosec to 20 mg Take 30- 60 min before your first and last meals of the day   GERD (REFLUX)  is an extremely common cause of respiratory symptoms just like yours , many times with no obvious heartburn at all.    It can be treated with medication, but also with lifestyle changes including elevation of the head of your bed (ideally with 6 inch  bed blocks),  Smoking cessation, avoidance of late meals, excessive alcohol, and avoid fatty foods, chocolate, peppermint, colas, red wine, and acidic juices such as orange juice.  NO MINT OR MENTHOL PRODUCTS SO NO COUGH DROPS   USE SUGARLESS CANDY INSTEAD (Jolley ranchers or Stover's or Life Savers) or even ice chips will also do - the key is to swallow to prevent all throat clearing. NO OIL BASED VITAMINS - use powdered substitutes.  Please see patient coordinator before you leave today  to schedule   Please schedule a follow up office visit in 4 weeks, sooner if needed with full pfts

## 2017-08-30 NOTE — Progress Notes (Signed)
Subjective:     Patient ID: Summer Nielsen, female   DOB: 09/27/1927,    MRN: [id number]  HPI  27 yowf never smoker prev worked at [employer] moved to [employer] around 2016  with new onset doe indolent onset spring 2018 gradually worse to point where   HT shopping leaning on cart / hc parking is a struggle > cardiology eval neg so referred to pulmonary clinic 08/30/2017 by Truitt Merle where followed for diastolic dysfunction with last NT PROBNP 655 08/16/17     08/30/2017 1st [place] [facility] office visit/ Ellanor Feuerstein   Chief Complaint  Patient presents with  . Pulmonary Consult    Referred by Dr. Servando Snare. Pt c/o SOB x 2 months.  She states she gets SOB when she gets in a hurry walking short distances.    pattern of doe is indolent onset / progressive/ predictable and proportionate to workload never at rest or hs and s assoc cough   pt around birds all her life, has bird feeder on patio and sweeps up after them regularly  No h/o amiodarone or chemo or collagen vasc dz though does have djd / no h/o macrodantin rx or uti's at all   No obvious day to day or daytime variability or assoc excess/ purulent sputum or mucus plugs or hemoptysis or cp or chest tightness, subjective wheeze or overt sinus or hb symptoms.   Sleeps fine flat  without nocturnal  or early am exacerbation  of respiratory  c/o's or need for noct saba. Also denies any obvious fluctuation of symptoms with weather or environmental changes or other aggravating or alleviating factors except as outlined above   No unusual exposure hx or h/o childhood pna/ asthma or knowledge of premature birth.  Current Allergies, Complete Past Medical History, Past Surgical History, Family History, and Social History were reviewed in [employer] record.  ROS  The following are not active complaints unless bolded Hoarseness, sore throat, dysphagia, dental problems, itching, sneezing,  nasal congestion or discharge of excess mucus or  purulent secretions, ear ache,   fever, chills, sweats, unintended wt loss or wt gain, classically pleuritic or exertional cp,  orthopnea pnd or arm/hand swelling  or leg swelling, presyncope, palpitations, abdominal pain, anorexia, nausea, vomiting, diarrhea  or change in bowel habits or change in bladder habits, change in stools or change in urine, dysuria, hematuria,  rash, arthralgias, visual complaints, headache, numbness, weakness or ataxia or problems with walking or coordination,  change in mood or  memory.        Current Meds  Medication Sig  . acetaminophen (TYLENOL) 500 MG tablet Take 1,000 mg by mouth daily as needed for moderate pain or headache.  Marland Kitchen amLODipine (NORVASC) 5 MG tablet Take 5 mg by mouth daily.  Marland Kitchen atorvastatin (LIPITOR) 20 MG tablet Take 20 mg by mouth daily.  . bimatoprost (LUMIGAN) 0.01 % SOLN Place 1 drop into both eyes at bedtime.   . calcium-vitamin D (OSCAL WITH D) 500-200 MG-UNIT tablet Take 1 tablet by mouth daily.   . furosemide (LASIX) 40 MG tablet Take 40 mg by mouth 2 (two) times daily.  Marland Kitchen gabapentin (NEURONTIN) 300 MG capsule Take 1 capsule (300 mg total) by mouth 3 (three) times daily.  Marland Kitchen ibuprofen (ADVIL,MOTRIN) 200 MG tablet Take 200 mg by mouth every 6 (six) hours as needed for moderate pain (back surgery).  . Iron-FA-B Cmp-C-Biot-Probiotic (FUSION PLUS) CAPS Take 1 capsule by mouth daily.  Marland Kitchen levothyroxine (SYNTHROID, LEVOTHROID) 100 MCG  tablet Take 100 mcg daily by mouth.  . losartan (COZAAR) 50 MG tablet Take 50 mg by mouth daily.  . Multiple Vitamins-Minerals (CENTRUM SILVER PO) Take 1 tablet by mouth daily.   . naproxen (NAPROSYN) 375 MG tablet Take 1 tablet (375 mg total) by mouth 2 (two) times daily.  Marland Kitchen omeprazole (PRILOSEC) 20 MG capsule  one daily with supper   . ondansetron (ZOFRAN) 4 MG tablet Take 4 mg by mouth every 6 (six) hours as needed for nausea or vomiting.  . propranolol (INDERAL) 20 MG tablet Take 20 mg by mouth 2 (two) times daily.    .          Review of Systems     Objective:   Physical Exam amb very pleasant elderly femaled nad  / Wt Readings from Last 3 Encounters:  08/30/17 145 lb 9.6 oz (66 kg)  08/08/17 145 lb 12.8 oz (66.1 kg)  06/02/17 144 lb (65.3 kg)     Vital signs reviewed - Note on arrival 02 sats  90% on RA   HEENT: nl dentition, turbinates bilaterally, and oropharynx. Nl external ear canals without cough reflex   NECK :  without JVD/Nodes/TM/ nl carotid upstrokes bilaterally   LUNGS: no acc muscle use,  Nl contour chest with insp crackles bases bilaterally without cough on insp or exp maneuvers   CV:  RRR  no s3 or murmur or increase in P2, and no edema   ABD:  soft and nontender with nl inspiratory excursion in the supine position. No bruits or organomegaly appreciated, bowel sounds nl  MS:  Walks moderate pace with cane/  ext warm without deformities, calf tenderness, cyanosis or clubbing DJD changes both hands  SKIN: warm and dry without lesions    NEURO:  alert, approp, nl sensorium with  no motor or cerebellar deficits apparent.           I personally reviewed images and agree with radiology impression as follows:  CXR:   08/08/17  Chronic lung changes similar to prior exam without segmental consolidation or pulmonary edema.  Mild cardiomegaly.  Aortic Atherosclerosis (ICD10-I70.0).  Anterior wedge compression fracture T12 with 60% loss of height and mild retropulsion/kyphosis similar to 06/02/2017 exam.    Assessment:

## 2017-08-30 NOTE — Assessment & Plan Note (Signed)
08/30/2017   Walked RA x one lap @ 185 stopped due to  Sob/ moderate pace sats 90% at end  - HSP serology 08/30/2017  - Collagen vascular dz serology 08/30/2017  - HRCT 08/30/2017   Mostly likely this is due to incipient PF.   DDx for pulmonary fibrosis  includes idiopathic pulmonary fibrosis, pulmonary fibrosis associated with rheumatologic diseases (which have a relatively benign course in most cases) , adverse effect from  drugs such as chemotherapy or amiodarone exposure, nonspecific interstitial pneumonia which is typically steroid responsive, and chronic hypersensitivity pneumonitis which fits nicely given bird exposure and the lack of impressive plain cxr findings so HRCT/ serologies next step.   In active  smokers Langerhan's Cell  Histiocyctosis (eosinophilic granuomatosis),  DIP,  and Respiratory Bronchiolitis ILD also need to be considered,  But can be ruled out here    Use of PPI is associated with improved survival time and with decreased radiologic fibrosis per King's study published in [facility] vol 184 p1390.  Dec 2011 and also may have other beneficial effects as per the latest review in [place] vol 193 J6811 Jun 20016.  This may not always be cause and effect, but given how universally unimpressive and expensive  all the other  Drugs developed to day  have been for pf,   rec start  rx ppi / diet/ lifestyle modification and f/u with serial walking sats and lung volumes for now to put more points on the curve / establish firm baseline before considering additional measures.    Total time devoted to counseling  > 50 % of initial 60 min office visit:  review case with pt/husband  discussion of options/alternatives/ personally creating written customized instructions  in presence of pt  then going over those specific  Instructions directly with the pt including how to use all of the meds but in particular covering each new medication in detail and the difference between the maintenance=  "automatic" meds and the prns using an action plan format for the latter (If this problem/symptom => do that organization reading Left to right).  Please see AVS from this visit for a full list of these instructions which I personally wrote for this pt and  are unique to this visit.

## 2017-08-31 ENCOUNTER — Other Ambulatory Visit (INDEPENDENT_AMBULATORY_CARE_PROVIDER_SITE_OTHER): Payer: Medicare Other

## 2017-08-31 DIAGNOSIS — R0609 Other forms of dyspnea: Secondary | ICD-10-CM | POA: Diagnosis not present

## 2017-08-31 LAB — CBC WITH DIFFERENTIAL/PLATELET
BASOS ABS: 0 10*3/uL (ref 0.0–0.1)
Basophils Relative: 0.4 % (ref 0.0–3.0)
EOS ABS: 0.6 10*3/uL (ref 0.0–0.7)
EOS PCT: 7.4 % — AB (ref 0.0–5.0)
HCT: 32.4 % — ABNORMAL LOW (ref 36.0–46.0)
Hemoglobin: 11.1 g/dL — ABNORMAL LOW (ref 12.0–15.0)
LYMPHS ABS: 1.8 10*3/uL (ref 0.7–4.0)
Lymphocytes Relative: 22.3 % (ref 12.0–46.0)
MCHC: 34.1 g/dL (ref 30.0–36.0)
MCV: 95.7 fl (ref 78.0–100.0)
MONO ABS: 0.9 10*3/uL (ref 0.1–1.0)
Monocytes Relative: 10.9 % (ref 3.0–12.0)
NEUTROS PCT: 59 % (ref 43.0–77.0)
Neutro Abs: 4.7 10*3/uL (ref 1.4–7.7)
Platelets: 280 10*3/uL (ref 150.0–400.0)
RBC: 3.39 Mil/uL — AB (ref 3.87–5.11)
RDW: 13.9 % (ref 11.5–15.5)
WBC: 8 10*3/uL (ref 4.0–10.5)

## 2017-08-31 LAB — SEDIMENTATION RATE: SED RATE: 59 mm/h — AB (ref 0–30)

## 2017-09-05 ENCOUNTER — Ambulatory Visit (INDEPENDENT_AMBULATORY_CARE_PROVIDER_SITE_OTHER): Payer: Medicare Other | Admitting: Specialist

## 2017-09-05 ENCOUNTER — Encounter (INDEPENDENT_AMBULATORY_CARE_PROVIDER_SITE_OTHER): Payer: Self-pay | Admitting: Specialist

## 2017-09-05 ENCOUNTER — Ambulatory Visit (INDEPENDENT_AMBULATORY_CARE_PROVIDER_SITE_OTHER): Payer: Medicare Other

## 2017-09-05 VITALS — BP 129/57 | HR 61 | Ht 62.0 in | Wt 142.0 lb

## 2017-09-05 DIAGNOSIS — Z981 Arthrodesis status: Secondary | ICD-10-CM | POA: Diagnosis not present

## 2017-09-05 DIAGNOSIS — S22080A Wedge compression fracture of T11-T12 vertebra, initial encounter for closed fracture: Secondary | ICD-10-CM | POA: Diagnosis not present

## 2017-09-05 LAB — CYCLIC CITRUL PEPTIDE ANTIBODY, IGG: Cyclic Citrullin Peptide Ab: 30 UNITS — ABNORMAL HIGH

## 2017-09-05 LAB — HYPERSENSITIVITY PNUEMONITIS PROFILE
ASPERGILLUS FUMIGATUS: NEGATIVE
Faenia retivirgula: NEGATIVE
PIGEON SERUM: NEGATIVE
S. VIRIDIS: NEGATIVE
T. CANDIDUS: NEGATIVE
T. VULGARIS: NEGATIVE

## 2017-09-05 LAB — ANA: ANA: NEGATIVE

## 2017-09-05 LAB — RHEUMATOID FACTOR: Rhuematoid fact SerPl-aCnc: <14 IU/mL (ref <14)

## 2017-09-05 MED ORDER — TRAMADOL-ACETAMINOPHEN 37.5-325 MG PO TABS: 1.0000 | ORAL_TABLET | Freq: Four times a day (QID) | ORAL | 0 refills | Status: DC | PRN

## 2017-09-05 NOTE — Patient Instructions (Signed)
Avoid frequent bending and stooping  No lifting greater than 10 lbs. May use ice or moist heat for pain. Weight loss is of benefit. Handicap license is approved.   

## 2017-09-05 NOTE — Progress Notes (Signed)
Office Visit Note   Patient: Sindy Mccune           Date of Birth: 1927/09/18           MRN: [id number] Visit Date: 09/05/2017              Requested by: Maurice Small, MD [place], [place] [address] PCP: Maurice Small, MD   Assessment & Plan: Visit Diagnoses:  1. Status post lumbar spinal fusion   2. Compression fracture of T12 vertebra (HCC)     Plan: Avoid frequent bending and stooping  No lifting greater than 10 lbs. May use ice or moist heat for pain. Weight loss is of benefit. Handicap license is approved.  Follow-Up Instructions: Return in about 3 weeks (around 09/26/2017).   Orders:  Orders Placed This Encounter  Procedures  . XR Lumbar Spine 2-3 Views   No orders of the defined types were placed in this encounter.     Procedures: No procedures performed   Clinical Data: No additional findings.   Subjective: Chief Complaint  Patient presents with  . Lower Back - Follow-up    6 month post op visit, c/o right side low back pain    84 year old female with history of L5-S1 fusion and TLIF for sever foramenal stensos with decompression she is experiencing right lower lumbar pain with bending and stooping and with getting out of bed.    Review of Systems   Objective: Vital Signs: BP (!) 129/57 (BP Location: Left Arm, Patient Position: Sitting)   Pulse 61   Ht _0  (1.575 m)   Wt 142 lb (64.4 kg)   LMP  (LMP Unknown)   BMI 25.97 kg/m   Physical Exam  Ortho Exam  Specialty Comments:  No specialty comments available.  Imaging: Xr Lumbar Spine 2-3 Views  Result Date: 09/05/2017 T12 compression fracture, this deformity dates back to the first post op xray, not seen on either plain radiographs 09/14/2016 or the MRI in 10/2016. So that it has been between 10/2016 and 03/2017. The position and alignment of the hardware at L5-S1 appears intact and stable. She has chronic scalloping of the end plates likely secondary to  Osteoporosis.     PMFS History: Patient Active Problem List   Diagnosis Date Noted  . Anemia due to acute blood loss 03/17/2017    Priority: High    Class: Acute  . Other secondary scoliosis, lumbar region 03/16/2017    Priority: High    Class: Chronic  . Spinal stenosis of lumbar region 03/15/2017    Priority: High    Class: Chronic  . DOE (dyspnea on exertion) 08/30/2017  . Status post lumbar spinal fusion 03/15/2017  . Closed displaced fracture of base of fifth metacarpal bone of left hand 10/12/2016  . Lumbar radiculopathy 08/26/2016  . Iron deficiency anemia 05/20/2016  . Chronic right shoulder pain 02/16/2016  . Acute diastolic heart failure ([place]) 10/30/2015  . Secondary pulmonary hypertension 10/30/2015  . Hypertensive heart disease with heart failure ([place]) 10/30/2015  . TIA (transient ischemic attack)   . 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 Postsurgical hypothyroidism   . OP (osteoporosis)   . Hyperlipidemia   . High blood pressure   . Glaucoma   . CHF (congestive heart failure) (HCC)    Past Medical History:  Diagnosis Date  . Cancer ([place])    squamous cell skin cancer on left leg-removed  . CHF (  congestive heart failure) ([place])   . Complication of anesthesia   . GERD (gastroesophageal reflux disease)   . Glaucoma   . High blood pressure   . Hyperlipidemia   . Hypothyroidism   . OP (osteoporosis)   . PONV (postoperative nausea and vomiting)   . Postsurgical hypothyroidism   . 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 Tachycardia   . TIA (transient ischemic attack)     Family History  Problem Relation Age of Onset  . Heart attack Mother   . Cerebral aneurysm Mother   . Stroke Mother   . Hypertension Mother   . Heart attack Father   . Heart disease Sister   . Heart disease Brother     Past Surgical History:  Procedure Laterality Date  . ABDOMINAL HYSTERECTOMY    . CHOLECYSTECTOMY      . EYE SURGERY     removed cateracts on both eyes  . JOINT REPLACEMENT     3 hip replacement on left, right knee replacement  . THYROIDECTOMY     Social History   Occupational History  . Not on file  Tobacco Use  . Smoking status: Never Smoker  . Smokeless tobacco: Never Used  Substance and Sexual Activity  . Alcohol use: No  . Drug use: No  . Sexual activity: Not on file

## 2017-09-06 ENCOUNTER — Telehealth (INDEPENDENT_AMBULATORY_CARE_PROVIDER_SITE_OTHER): Payer: Self-pay | Admitting: Specialist

## 2017-09-06 NOTE — Telephone Encounter (Signed)
Patient would like a call if there are any cancellations, she needs for an MRI review -# [phone]

## 2017-09-06 NOTE — Telephone Encounter (Signed)
I put her on the cancellation list as long as her MRI is done we can move her up sooner

## 2017-09-07 ENCOUNTER — Encounter: Payer: Self-pay | Admitting: Internal Medicine

## 2017-09-07 ENCOUNTER — Ambulatory Visit (INDEPENDENT_AMBULATORY_CARE_PROVIDER_SITE_OTHER)
Admission: RE | Admit: 2017-09-07 | Discharge: 2017-09-07 | Disposition: A | Discharge status: Home / Self Care | Payer: Medicare Other | Source: Ambulatory Visit | Attending: Internal Medicine | Admitting: Internal Medicine

## 2017-09-07 DIAGNOSIS — R0609 Other forms of dyspnea: Secondary | ICD-10-CM | POA: Diagnosis not present

## 2017-09-07 DIAGNOSIS — R160 Hepatomegaly, not elsewhere classified: Secondary | ICD-10-CM | POA: Insufficient documentation

## 2017-09-09 ENCOUNTER — Other Ambulatory Visit: Payer: Self-pay | Admitting: Internal Medicine

## 2017-09-09 ENCOUNTER — Telehealth: Payer: Self-pay | Admitting: Internal Medicine

## 2017-09-09 ENCOUNTER — Encounter (INDEPENDENT_AMBULATORY_CARE_PROVIDER_SITE_OTHER): Payer: Self-pay | Admitting: Specialist

## 2017-09-09 DIAGNOSIS — R160 Hepatomegaly, not elsewhere classified: Secondary | ICD-10-CM

## 2017-09-09 NOTE — Telephone Encounter (Signed)
Spoke with Opal Sidles with [facility] Radiology Call report on patient's 7.3.19 HRCT  Please review IMPRESSION copied below, note #s 1 and 2 per Opal Sidles: IMPRESSION: 1. Hypodense 4.1 cm anterior liver mass, new since 07/22/2016 CT abdomen study, concerning for a liver metastasis. MRI abdomen without and with IV contrast recommended for further evaluation. 2. Patchy subpleural and peribronchovascular reticulation and ground-glass attenuation with minimal traction bronchiolectasis, mild architectural distortion and prominent patchy air trapping, mildly worsened since 06/11/2016 chest CT. Findings are most compatible with chronic hypersensitivity pneumonitis. Findings are not compatible with usual interstitial pneumonia (UIP). 3. Mild mediastinal lymphadenopathy, stable since 06/11/2016, most compatible with benign reactive adenopathy. 4. Left main and 3 vessel coronary atherosclerosis. 5. Stable dilated main pulmonary artery, suggesting chronic pulmonary arterial hypertension.

## 2017-09-09 NOTE — Telephone Encounter (Signed)
Aware, see result note

## 2017-09-12 IMAGING — CT CT ANGIO CHEST
2 of 7 series · 19 of 36 positions shown · IV contrast (isovue)
Comparison: Chest x-ray 05/20/2016 and 10/17/2015

CLINICAL DATA: Shortness of breath 4-6 weeks

EXAM:
CT ANGIOGRAPHY CHEST WITH CONTRAST
TECHNIQUE: Multidetector CT imaging of the chest was performed using the
standard protocol during bolus administration of intravenous
contrast. Multiplanar CT image reconstructions and MIPs were
obtained to evaluate the vascular anatomy.
CONTRAST:  75 mL Isovue 370 IV.

[Series 5: thins · axial · 0.65mm/px · z∈[-320,-65]mm · 18 of 285 slices shown]
[im 15/285  lung]
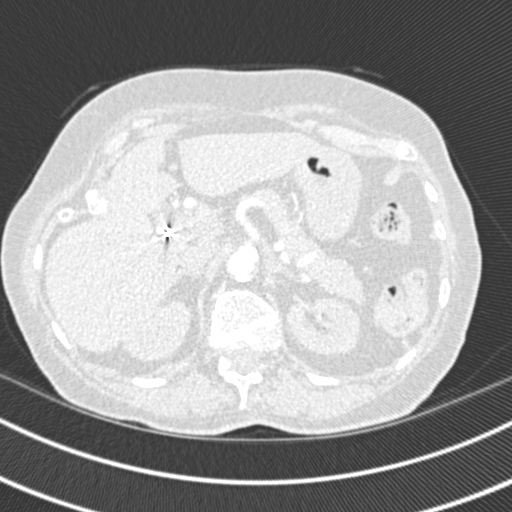
[im 29/285  mediastinal]
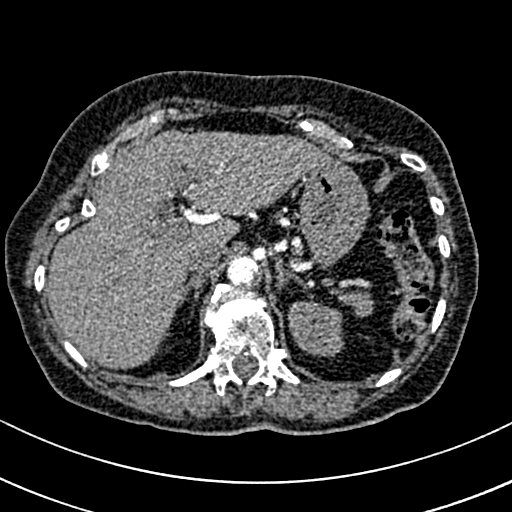
[im 43/285  lung]
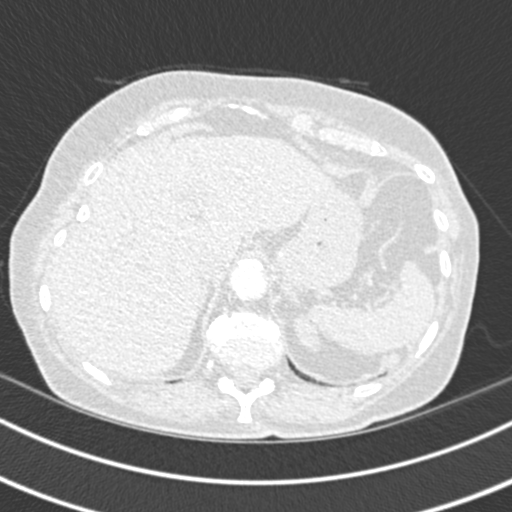
[im 57/285  mediastinal]
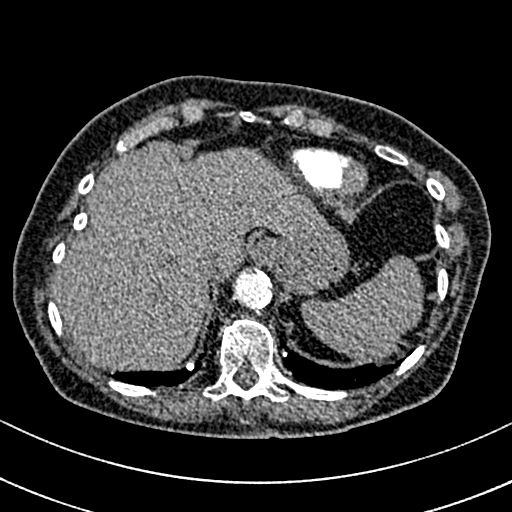
[im 72/285  lung]
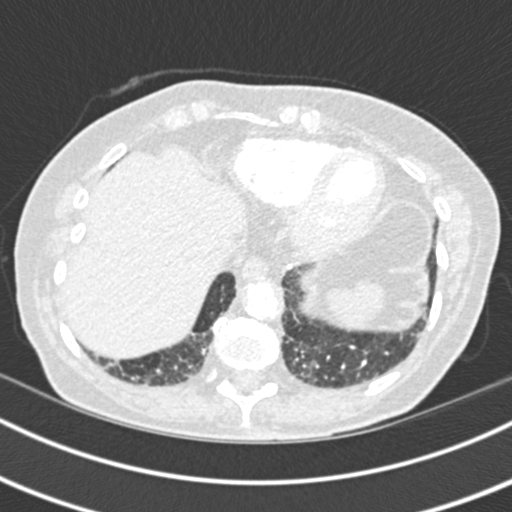
[im 86/285  mediastinal]
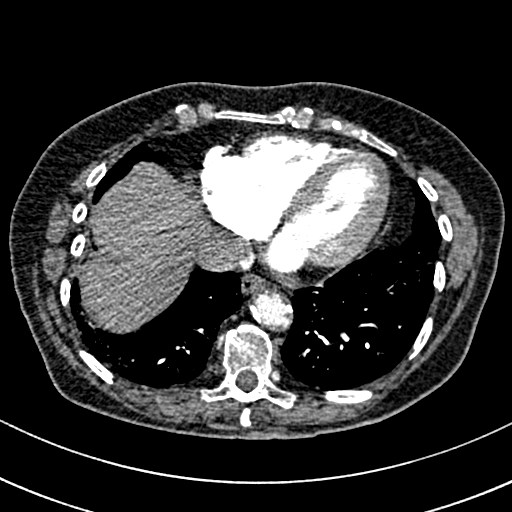
[im 100/285  lung]
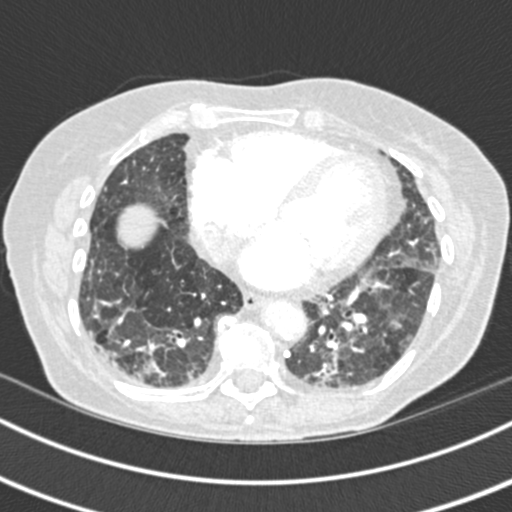
[im 114/285  mediastinal]
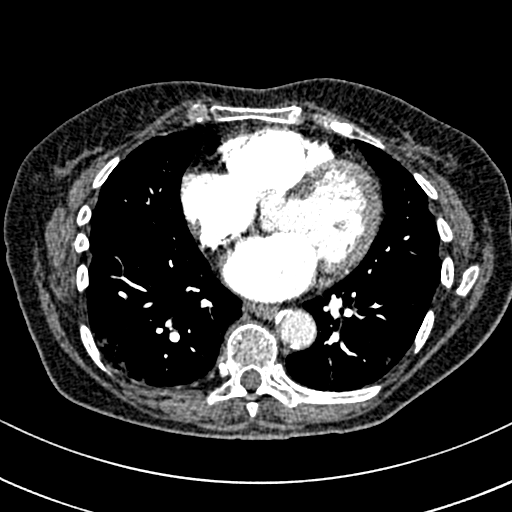
[im 128/285  lung]
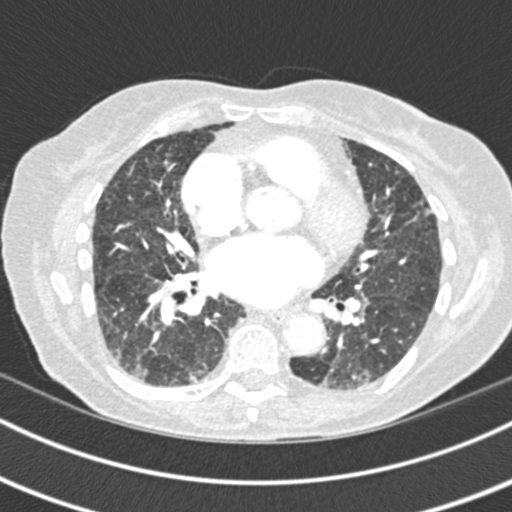
[im 157/285  mediastinal]
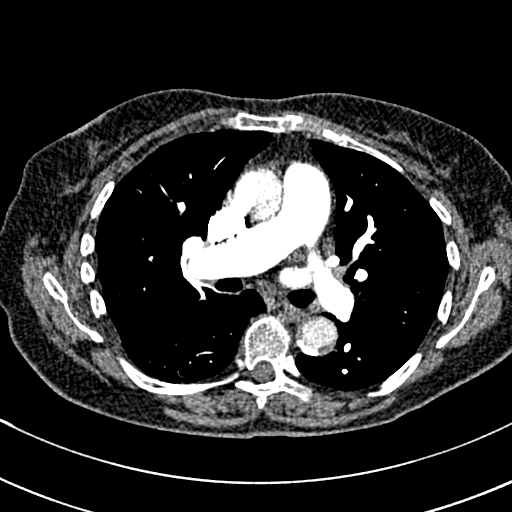
[im 171/285  lung]
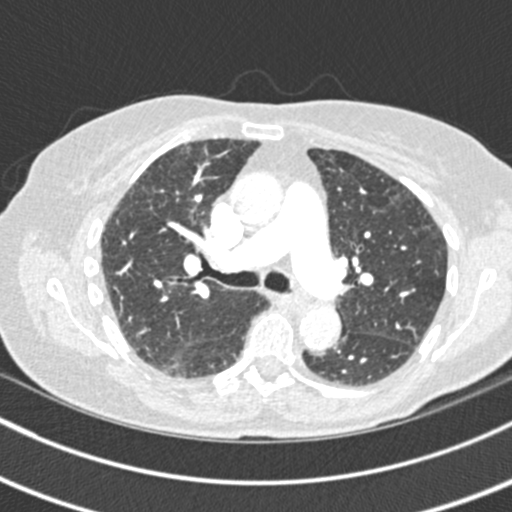
[im 185/285  mediastinal]
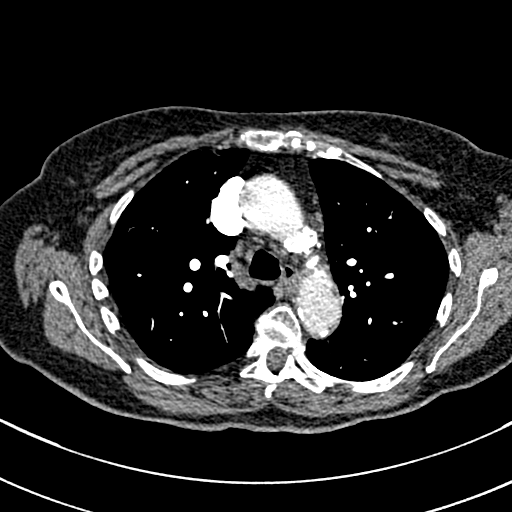
[im 199/285  lung]
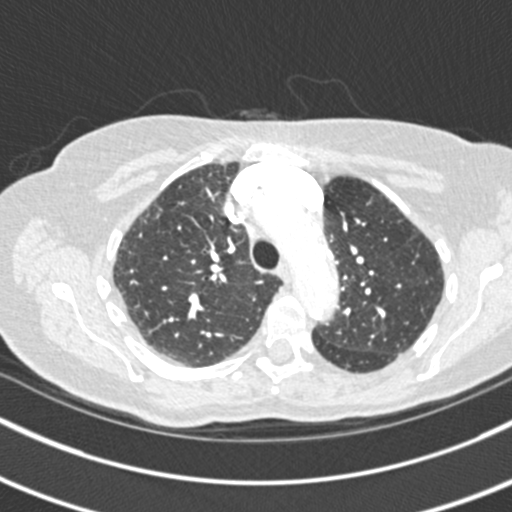
[im 214/285  mediastinal]
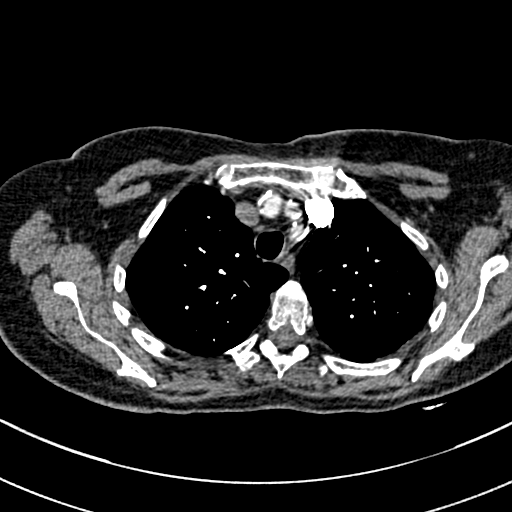
[im 228/285  lung]
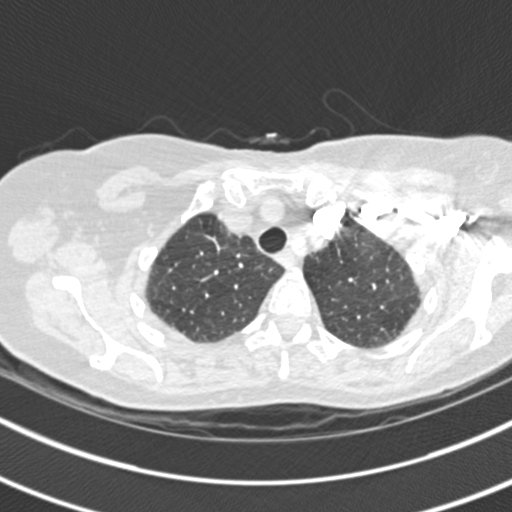
[im 242/285  mediastinal]
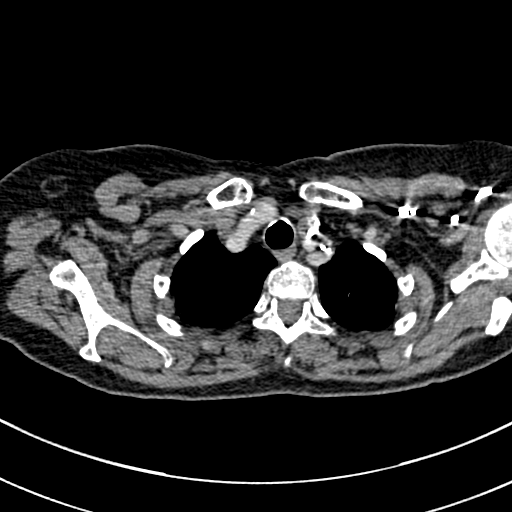
[im 256/285  lung]
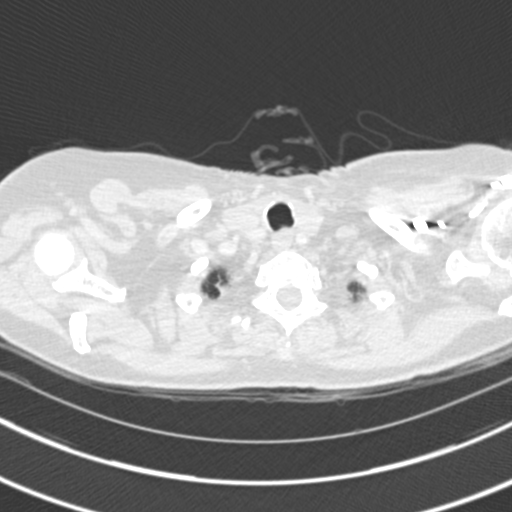
[im 270/285  mediastinal]
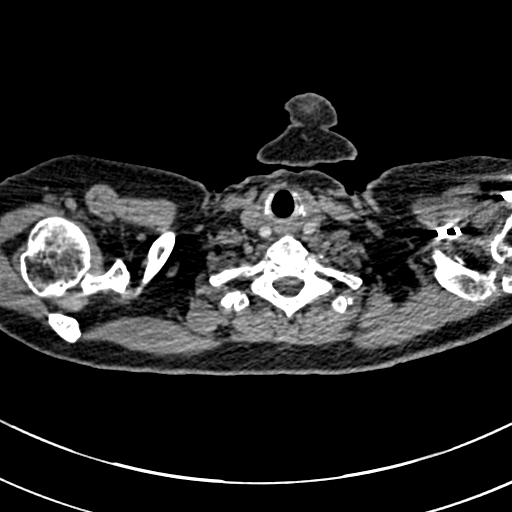

[Series 7: coronal mpr · coronal · 0.57mm/px · 1 of 108 slices shown]
[im 54/108  mediastinal]
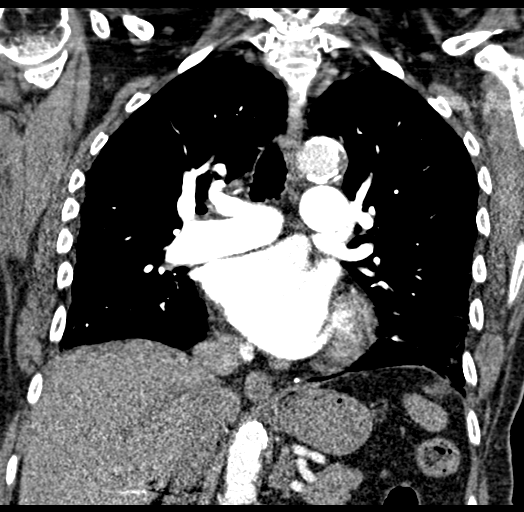

[19 of 36 positions shown; findings below may reference images not displayed]

FINDINGS: Cardiovascular: Mild cardiomegaly. Minimal calcified plaque over the
left main, left anterior descending and lateral circumflex coronary
arteries. Mild calcified plaque over the thoracic aorta and origin
of the great vessels. Pulmonary arterial system is normal without
emboli.

Mediastinum/Nodes: No evidence of mediastinal or hilar adenopathy.
Few calcified right hilar lymph nodes. Remaining mediastinal
structures are unremarkable.

Lungs/Pleura: Lungs are well inflated with subtle patchy peripheral
linear density in mild peripheral interstitial prominence within the
lung bases likely chronic no evidence of lobar consolidation or
effusion. Several small calcified granulomas within the lung bases.
Airways are normal.

Upper Abdomen: Previous cholecystectomy. Partially visualized cyst
over the mid to upper pole right kidney.

Musculoskeletal: Degenerative change of the spine. Slight curvature
of the thoracic spine convex right.

Review of the MIP images confirms the above findings.
IMPRESSION: No evidence of pulmonary emboli.

Subtle bibasilar peripheral increased interstitial markings and
peripheral linear opacity suggesting chronic change atelectasis.
Evidence of previous granulomas disease.

Mild cardiomegaly. Atherosclerotic coronary artery disease. Aortic
atherosclerosis.

## 2017-09-13 ENCOUNTER — Telehealth: Payer: Self-pay | Admitting: Internal Medicine

## 2017-09-13 ENCOUNTER — Encounter (HOSPITAL_COMMUNITY): Payer: Self-pay | Admitting: Radiology

## 2017-09-13 ENCOUNTER — Ambulatory Visit (HOSPITAL_COMMUNITY)
Admission: RE | Admit: 2017-09-13 | Discharge: 2017-09-13 | Disposition: A | Discharge status: Home / Self Care | Payer: Medicare Other | Source: Ambulatory Visit | Attending: Internal Medicine | Admitting: Internal Medicine

## 2017-09-13 DIAGNOSIS — R160 Hepatomegaly, not elsewhere classified: Secondary | ICD-10-CM

## 2017-09-13 DIAGNOSIS — I700 Atherosclerosis of aorta: Secondary | ICD-10-CM | POA: Insufficient documentation

## 2017-09-13 DIAGNOSIS — R932 Abnormal findings on diagnostic imaging of liver and biliary tract: Secondary | ICD-10-CM | POA: Diagnosis not present

## 2017-09-13 MED ORDER — GADOBENATE DIMEGLUMINE 529 MG/ML IV SOLN
13.0000 mL | Freq: Once | INTRAVENOUS | Status: AC | PRN
  Administered 2017-09-13: 13 mL via INTRAVENOUS

## 2017-09-13 NOTE — Telephone Encounter (Signed)
New order has been placed for MR abdomen w and wo contrast per radiology's request.    atc radiology to make aware.  Nothing further needed.

## 2017-09-14 ENCOUNTER — Other Ambulatory Visit: Payer: Self-pay | Admitting: Internal Medicine

## 2017-09-14 DIAGNOSIS — R160 Hepatomegaly, not elsewhere classified: Secondary | ICD-10-CM

## 2017-09-16 ENCOUNTER — Other Ambulatory Visit (HOSPITAL_COMMUNITY): Payer: Self-pay | Admitting: Gastroenterology

## 2017-09-16 DIAGNOSIS — R160 Hepatomegaly, not elsewhere classified: Secondary | ICD-10-CM

## 2017-09-17 ENCOUNTER — Ambulatory Visit
Admission: RE | Admit: 2017-09-17 | Discharge: 2017-09-17 | Disposition: A | Discharge status: Home / Self Care | Payer: Medicare Other | Source: Ambulatory Visit | Attending: Specialist | Admitting: Specialist

## 2017-09-17 DIAGNOSIS — S22080A Wedge compression fracture of T11-T12 vertebra, initial encounter for closed fracture: Secondary | ICD-10-CM

## 2017-09-17 DIAGNOSIS — Z981 Arthrodesis status: Secondary | ICD-10-CM

## 2017-09-17 MED ORDER — GADOBENATE DIMEGLUMINE 529 MG/ML IV SOLN
13.0000 mL | Freq: Once | INTRAVENOUS | Status: AC | PRN
  Administered 2017-09-17: 13 mL via INTRAVENOUS

## 2017-09-19 ENCOUNTER — Other Ambulatory Visit (HOSPITAL_COMMUNITY): Payer: Self-pay | Admitting: Gastroenterology

## 2017-09-19 DIAGNOSIS — R160 Hepatomegaly, not elsewhere classified: Secondary | ICD-10-CM

## 2017-09-20 ENCOUNTER — Ambulatory Visit (INDEPENDENT_AMBULATORY_CARE_PROVIDER_SITE_OTHER): Payer: Medicare Other | Admitting: Specialist

## 2017-09-20 ENCOUNTER — Encounter (INDEPENDENT_AMBULATORY_CARE_PROVIDER_SITE_OTHER): Payer: Self-pay | Admitting: Specialist

## 2017-09-20 VITALS — BP 135/64 | HR 65 | Ht 62.0 in | Wt 142.0 lb

## 2017-09-20 DIAGNOSIS — M8000XD Age-related osteoporosis with current pathological fracture, unspecified site, subsequent encounter for fracture with routine healing: Secondary | ICD-10-CM | POA: Diagnosis not present

## 2017-09-20 DIAGNOSIS — M4326 Fusion of spine, lumbar region: Secondary | ICD-10-CM | POA: Diagnosis not present

## 2017-09-20 DIAGNOSIS — S22000A Wedge compression fracture of unspecified thoracic vertebra, initial encounter for closed fracture: Secondary | ICD-10-CM

## 2017-09-20 NOTE — Progress Notes (Signed)
Office Visit Note   Patient: Summer Nielsen           Date of Birth: 1927-03-15           MRN: [id number] Visit Date: 09/20/2017              Requested by: Maurice Small, MD [address] [place], [place] [address] PCP: Maurice Small, MD   Assessment & Plan: Visit Diagnoses:  1. Fusion of lumbar spine   2. Age-related osteoporosis with current pathological fracture with routine healing, subsequent encounter     Plan: Avoid frequent bending and stooping  No lifting greater than 10 lbs. May use ice or moist heat for pain. Weight loss is of benefit. Handicap license is approved.  Follow-Up Instructions: Return in about 3 months (around 12/21/2017).   Orders:  No orders of the defined types were placed in this encounter.  No orders of the defined types were placed in this encounter.     Procedures: No procedures performed   Clinical Data: No additional findings.   Subjective: Chief Complaint  Patient presents with  . Lower Back - Follow-up    Lumbar MRI Review    84 year old female with history of left leg weakness post left hip replacement with persistent weakness left quadriceps, She has has a T 12 compression fracture 03/2017   Review of Systems  Constitutional: Negative.   HENT: Negative.   Eyes: Negative.   Respiratory: Negative.   Cardiovascular: Negative.   Gastrointestinal: Negative.   Endocrine: Negative.   Genitourinary: Negative.   Musculoskeletal: Negative.   Skin: Negative.   Allergic/Immunologic: Negative.   Neurological: Negative.   Hematological: Negative.   Psychiatric/Behavioral: Negative.      Objective: Vital Signs: BP 135/64 (BP Location: Left Arm, Patient Position: Sitting)   Pulse 65   Ht _0  (1.575 m)   Wt 142 lb (64.4 kg)   LMP  (LMP Unknown)   BMI 25.97 kg/m   Physical Exam  Constitutional: She is oriented to person, place, and time. She appears well-developed and well-nourished.  HENT:  Head:  Normocephalic and atraumatic.  Eyes: Pupils are equal, round, and reactive to light. EOM are normal.  Neck: Normal range of motion. Neck supple.  Pulmonary/Chest: Effort normal and breath sounds normal.  Abdominal: Soft. Bowel sounds are normal.  Neurological: She is alert and oriented to person, place, and time.  Skin: Skin is warm and dry.  Psychiatric: She has a normal mood and affect. Her behavior is normal. Judgment and thought content normal.    Back Exam   Tenderness  The patient is experiencing tenderness in the lumbar.  Range of Motion  Extension: abnormal  Flexion: abnormal  Lateral bend right: normal  Lateral bend left: normal  Rotation right: normal  Rotation left: normal   Muscle Strength  Right Quadriceps:  5/5  Left Quadriceps:  5/5  Right Hamstrings:  5/5  Left Hamstrings:  5/5   Tests  Straight leg raise right: negative Straight leg raise left: negative  Reflexes  Patellar: normal Achilles: normal Babinski's sign: normal   Other  Toe walk: normal Heel walk: normal Sensation: normal Gait: normal  Erythema: no back redness Scars: absent  Comments:  Left quadriceps weakness that is preexisting.       Specialty Comments:  No specialty comments available.  Imaging: No results found.   PMFS History: Patient Active Problem List   Diagnosis Date Noted  . Anemia due to acute  blood loss 03/17/2017    Priority: High    Class: Acute  . Other secondary scoliosis, lumbar region 03/16/2017    Priority: High    Class: Chronic  . Spinal stenosis of lumbar region 03/15/2017    Priority: High    Class: Chronic  . Liver mass 09/07/2017  . DOE (dyspnea on exertion) 08/30/2017  . Status post lumbar spinal fusion 03/15/2017  . Closed displaced fracture of base of fifth metacarpal bone of left hand 10/12/2016  . Lumbar radiculopathy 08/26/2016  . Iron deficiency anemia 05/20/2016  . Chronic right shoulder pain 02/16/2016  . Acute diastolic heart  failure ([facility]) 10/30/2015  . Secondary pulmonary hypertension 10/30/2015  . Hypertensive heart disease with heart failure ([place]) 10/30/2015  . TIA (transient ischemic attack)   . 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 Postsurgical hypothyroidism   . OP (osteoporosis)   . Hyperlipidemia   . High blood pressure   . Glaucoma   . CHF (congestive heart failure) ([facility])    Past Medical History:  Diagnosis Date  . Cancer ([place])    squamous cell skin cancer on left leg-removed  . CHF (congestive heart failure) ([facility])   . Complication of anesthesia   . GERD (gastroesophageal reflux disease)   . Glaucoma   . High blood pressure   . Hyperlipidemia   . Hypothyroidism   . OP (osteoporosis)   . PONV (postoperative nausea and vomiting)   . Postsurgical hypothyroidism   . 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 Tachycardia   . TIA (transient ischemic attack)     Family History  Problem Relation Age of Onset  . Heart attack Mother   . Cerebral aneurysm Mother   . Stroke Mother   . Hypertension Mother   . Heart attack Father   . Heart disease Sister   . Heart disease Brother     Past Surgical History:  Procedure Laterality Date  . ABDOMINAL HYSTERECTOMY    . CHOLECYSTECTOMY    . EYE SURGERY     removed cateracts on both eyes  . JOINT REPLACEMENT     3 hip replacement on left, right knee replacement  . THYROIDECTOMY     Social History   Occupational History  . Not on file  Tobacco Use  . Smoking status: Never Smoker  . Smokeless tobacco: Never Used  Substance and Sexual Activity  . Alcohol use: No  . Drug use: No  . Sexual activity: Not on file

## 2017-09-20 NOTE — Patient Instructions (Signed)
Avoid frequent bending and stooping  No lifting greater than 10 lbs. May use ice or moist heat for pain. Weight loss is of benefit. Handicap license is approved.

## 2017-09-22 ENCOUNTER — Encounter (HOSPITAL_COMMUNITY): Payer: Self-pay

## 2017-09-22 ENCOUNTER — Ambulatory Visit (HOSPITAL_COMMUNITY)
Admission: RE | Admit: 2017-09-22 | Discharge: 2017-09-22 | Disposition: A | Discharge status: Home / Self Care | Payer: Medicare Other | Source: Ambulatory Visit | Attending: Gastroenterology | Admitting: Gastroenterology

## 2017-09-22 ENCOUNTER — Other Ambulatory Visit: Payer: Self-pay

## 2017-09-22 DIAGNOSIS — E890 Postprocedural hypothyroidism: Secondary | ICD-10-CM | POA: Diagnosis not present

## 2017-09-22 DIAGNOSIS — K219 Gastro-esophageal reflux disease without esophagitis: Secondary | ICD-10-CM | POA: Diagnosis not present

## 2017-09-22 DIAGNOSIS — Z791 Long term (current) use of non-steroidal anti-inflammatories (NSAID): Secondary | ICD-10-CM | POA: Insufficient documentation

## 2017-09-22 DIAGNOSIS — Z79899 Other long term (current) drug therapy: Secondary | ICD-10-CM | POA: Diagnosis not present

## 2017-09-22 DIAGNOSIS — E785 Hyperlipidemia, unspecified: Secondary | ICD-10-CM | POA: Diagnosis not present

## 2017-09-22 DIAGNOSIS — Z85828 Personal history of other malignant neoplasm of skin: Secondary | ICD-10-CM | POA: Diagnosis not present

## 2017-09-22 DIAGNOSIS — Z96642 Presence of left artificial hip joint: Secondary | ICD-10-CM | POA: Diagnosis not present

## 2017-09-22 DIAGNOSIS — C227 Other specified carcinomas of liver: Secondary | ICD-10-CM | POA: Insufficient documentation

## 2017-09-22 DIAGNOSIS — Z96651 Presence of right artificial knee joint: Secondary | ICD-10-CM | POA: Insufficient documentation

## 2017-09-22 DIAGNOSIS — H409 Unspecified glaucoma: Secondary | ICD-10-CM | POA: Insufficient documentation

## 2017-09-22 DIAGNOSIS — I509 Heart failure, unspecified: Secondary | ICD-10-CM | POA: Insufficient documentation

## 2017-09-22 DIAGNOSIS — R160 Hepatomegaly, not elsewhere classified: Secondary | ICD-10-CM

## 2017-09-22 DIAGNOSIS — K7689 Other specified diseases of liver: Secondary | ICD-10-CM | POA: Diagnosis present

## 2017-09-22 DIAGNOSIS — Z79890 Hormone replacement therapy (postmenopausal): Secondary | ICD-10-CM | POA: Diagnosis not present

## 2017-09-22 DIAGNOSIS — Z8673 Personal history of transient ischemic attack (TIA), and cerebral infarction without residual deficits: Secondary | ICD-10-CM | POA: Diagnosis not present

## 2017-09-22 LAB — PROTIME-INR
INR: 0.95
Prothrombin Time: 12.6 seconds (ref 11.4–15.2)

## 2017-09-22 LAB — CBC
HCT: 37.2 % (ref 36.0–46.0)
Hemoglobin: 12.1 g/dL (ref 12.0–15.0)
MCH: 31.6 pg (ref 26.0–34.0)
MCHC: 32.5 g/dL (ref 30.0–36.0)
MCV: 97.1 fL (ref 78.0–100.0)
PLATELETS: 361 10*3/uL (ref 150–400)
RBC: 3.83 MIL/uL — AB (ref 3.87–5.11)
RDW: 13.1 % (ref 11.5–15.5)
WBC: 8.7 10*3/uL (ref 4.0–10.5)

## 2017-09-22 LAB — APTT: APTT: 34 s (ref 24–36)

## 2017-09-22 MED ORDER — LIDOCAINE HCL (PF) 1 % IJ SOLN
INTRAMUSCULAR | Status: AC | PRN
  Administered 2017-09-22: 10 mL

## 2017-09-22 MED ORDER — FENTANYL CITRATE (PF) 100 MCG/2ML IJ SOLN
INTRAMUSCULAR | Status: AC | PRN
  Administered 2017-09-22: 25 ug via INTRAVENOUS
  Administered 2017-09-22: 50 ug via INTRAVENOUS
  Administered 2017-09-22: 25 ug via INTRAVENOUS

## 2017-09-22 MED ORDER — LIDOCAINE HCL 1 % IJ SOLN
INTRAMUSCULAR | Status: AC
  Filled 2017-09-22: qty 10

## 2017-09-22 MED ORDER — FENTANYL CITRATE (PF) 100 MCG/2ML IJ SOLN
INTRAMUSCULAR | Status: AC
  Filled 2017-09-22: qty 2

## 2017-09-22 MED ORDER — MIDAZOLAM HCL 2 MG/2ML IJ SOLN
INTRAMUSCULAR | Status: AC
  Filled 2017-09-22: qty 4

## 2017-09-22 MED ORDER — GELATIN ABSORBABLE 12-7 MM EX MISC
CUTANEOUS | Status: AC
  Filled 2017-09-22: qty 1

## 2017-09-22 MED ORDER — SODIUM CHLORIDE 0.9 % IV SOLN
INTRAVENOUS | Status: DC
  Administered 2017-09-22: 11:00:00 via INTRAVENOUS

## 2017-09-22 MED ORDER — MIDAZOLAM HCL 2 MG/2ML IJ SOLN
INTRAMUSCULAR | Status: AC | PRN
  Administered 2017-09-22 (×2): 0.5 mg via INTRAVENOUS
  Administered 2017-09-22: 1 mg via INTRAVENOUS

## 2017-09-22 NOTE — Procedures (Signed)
Interventional Radiology Procedure Note  Procedure: US guided liver mass biopsy  Complications: None  Estimated Blood Loss: < 10 mL  Findings: US guided 18 G core biopsy x 2 via 17 G needle of anterior hepatic mass, roughly 5 cm maximum diameter.  Venetia Night. Kathlene Cote, M.D Pager:  [phone]

## 2017-09-22 NOTE — Discharge Instructions (Signed)
Moderate Conscious Sedation, Adult, Care After These instructions provide you with information about caring for yourself after your procedure. Your health care provider may also give you more specific instructions. Your treatment has been planned according to current medical practices, but problems sometimes occur. Call your health care provider if you have any problems or questions after your procedure. What can I expect after the procedure? After your procedure, it is common:  To feel sleepy for several hours.  To feel clumsy and have poor balance for several hours.  To have poor judgment for several hours.  To vomit if you eat too soon.  Follow these instructions at home: For at least 24 hours after the procedure:   Do not: ? Participate in activities where you could fall or become injured. ? Drive. ? Use heavy machinery. ? Drink alcohol. ? Take sleeping pills or medicines that cause drowsiness. ? Make important decisions or sign legal documents. ? Take care of children on your own.  Rest. Eating and drinking  Follow the diet recommended by your health care provider.  If you vomit: ? Drink water, juice, or soup when you can drink without vomiting. ? Make sure you have little or no nausea before eating solid foods. General instructions  Have a responsible adult stay with you until you are awake and alert.  Take over-the-counter and prescription medicines only as told by your health care provider.  If you smoke, do not smoke without supervision.  Keep all follow-up visits as told by your health care provider. This is important. Contact a health care provider if:  You keep feeling nauseous or you keep vomiting.  You feel light-headed.  You develop a rash.  You have a fever. Get help right away if:  You have trouble breathing. This information is not intended to replace advice given to you by your health care provider. Make sure you discuss any questions you have  with your health care provider. Document Released: 12/13/2012 Document Revised: 07/28/2015 Document Reviewed: 06/14/2015 Elsevier Interactive Patient Education  2018 [employer].   Liver Biopsy, Care After These instructions give you information on caring for yourself after your procedure. Your doctor may also give you more specific instructions. Call your doctor if you have any problems or questions after your procedure. Follow these instructions at home:  Rest at home for 1-2 days or as told by your doctor.  Have someone stay with you for at least 24 hours.  Do not do these things in the first 24 hours: ? Drive. ? Use machinery. ? Take care of other people. ? Sign legal documents. ? Take a bath or shower.  There are many different ways to close and cover a cut (incision). For example, a cut can be closed with stitches, skin glue, or adhesive strips. Follow your doctor's instructions on: ? Taking care of your cut. ? Changing and removing your bandage (dressing). ? Removing whatever was used to close your cut.  Do not drink alcohol in the first week.  Do not lift more than 5 pounds or play contact sports for the first 2 weeks.  Take medicines only as told by your doctor. For 1 week, do not take medicine that has aspirin in it or medicines like ibuprofen.  Get your test results. Contact a doctor if:  A cut bleeds and leaves more than just a small spot of blood.  A cut is red, puffs up (swells), or hurts more than before.  Fluid or something else  comes from a cut.  A cut smells bad.  You have a fever or chills. Get help right away if:  You have swelling, bloating, or pain in your belly (abdomen).  You get dizzy or faint.  You have a rash.  You feel sick to your stomach (nauseous) or throw up (vomit).  You have trouble breathing, feel short of breath, or feel faint.  Your chest hurts.  You have problems talking or seeing.  You have trouble balancing or moving  your arms or legs. This information is not intended to replace advice given to you by your health care provider. Make sure you discuss any questions you have with your health care provider. Document Released: 12/02/2007 Document Revised: 07/31/2015 Document Reviewed: 04/20/2013 Elsevier Interactive Patient Education  [employer].

## 2017-09-22 NOTE — H&P (Signed)
Chief Complaint: Patient was seen in consultation today for liver mass biopsy at the request of Magod,Marc  Referring Physician(s): Magod,Marc  Supervising Physician: Aletta Edouard  Patient Status: [facility] - In-pt  History of Present Illness: Summer Nielsen is a 84 y.o. female who was found to have a large liver mass on chest CT as an incidental finding. She has since had MRI which further characterized the mass as suspicious for malignancy. She is now referred for biopsy PMHx, meds, labs, imaging, allergies reviewed. Feels well, no recent fevers, chills, illness. Has been NPO today as directed.    Past Medical History:  Diagnosis Date  . Cancer ([place])    squamous cell skin cancer on left leg-removed  . CHF (congestive heart failure) ([place])   . Complication of anesthesia   . GERD (gastroesophageal reflux disease)   . Glaucoma   . High blood pressure   . Hyperlipidemia   . Hypothyroidism   . OP (osteoporosis)   . PONV (postoperative nausea and vomiting)   . Postsurgical hypothyroidism   . 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 Tachycardia   . TIA (transient ischemic attack)     Past Surgical History:  Procedure Laterality Date  . ABDOMINAL HYSTERECTOMY    . CHOLECYSTECTOMY    . EYE SURGERY     removed cateracts on both eyes  . JOINT REPLACEMENT     3 hip replacement on left, right knee replacement  . THYROIDECTOMY      Allergies: Levofloxacin and Percocet [oxycodone-acetaminophen]  Medications: Prior to Admission medications   Medication Sig Start Date End Date Taking? Authorizing Provider  acetaminophen (TYLENOL) 500 MG tablet Take 1,000 mg by mouth daily as needed for moderate pain or headache.   Yes [provider]  amLODipine (NORVASC) 5 MG tablet Take 5 mg by mouth daily.   Yes [provider]  atorvastatin (LIPITOR) 20 MG tablet Take 20 mg by mouth daily. 02/17/17  Yes [provider]    bimatoprost (LUMIGAN) 0.01 % SOLN Place 1 drop into both eyes at bedtime.    Yes [provider]  calcium-vitamin D (OSCAL WITH D) 500-200 MG-UNIT tablet Take 1 tablet by mouth daily.    Yes [provider]  furosemide (LASIX) 40 MG tablet Take 40 mg by mouth 2 (two) times daily.   Yes [provider]  gabapentin (NEURONTIN) 300 MG capsule Take 1 capsule (300 mg total) by mouth 3 (three) times daily. 11/25/16  Yes Magnus Sinning, MD  ibuprofen (ADVIL,MOTRIN) 200 MG tablet Take 200 mg by mouth every 6 (six) hours as needed for moderate pain (back surgery).   Yes [provider]  Iron-FA-B Cmp-C-Biot-Probiotic (FUSION PLUS) CAPS Take 1 capsule by mouth daily. 04/14/17  Yes Jessy Oto, MD  levothyroxine (SYNTHROID, LEVOTHROID) 100 MCG tablet Take 100 mcg daily by mouth. 01/09/17  Yes [provider]  losartan (COZAAR) 50 MG tablet Take 50 mg by mouth daily.   Yes [provider]  Multiple Vitamins-Minerals (CENTRUM SILVER PO) Take 1 tablet by mouth daily.    Yes [provider]  naproxen (NAPROSYN) 375 MG tablet Take 1 tablet (375 mg total) by mouth 2 (two) times daily. 10/06/16  Yes Varney Biles, MD  omeprazole (PRILOSEC) 20 MG capsule Take 30- 60 min before your first and last meals of the day 08/30/17  Yes Tanda Rockers, MD  ondansetron (ZOFRAN) 4 MG tablet Take 4 mg by mouth every 6 (six)  hours as needed for nausea or vomiting.   Yes [provider]  propranolol (INDERAL) 20 MG tablet Take 20 mg by mouth 2 (two) times daily.    Yes [provider]  traMADol-acetaminophen (ULTRACET) 37.5-325 MG tablet Take 1 tablet by mouth every 6 (six) hours as needed. 09/05/17  Yes Jessy Oto, MD     Family History  Problem Relation Age of Onset  . Heart attack Mother   . Cerebral aneurysm Mother   . Stroke Mother   . Hypertension Mother   . Heart attack Father   . Heart disease Sister   . Heart disease Brother      Social History   Socioeconomic History  . Marital status: Married    Spouse name: Not on file  . Number of children: Not on file  . Years of education: Not on file  . Highest education level: Not on file  Occupational History  . Not on file  Social Needs  . Financial resource strain: Not on file  . Food insecurity:    Worry: Not on file    Inability: Not on file  . Transportation needs:    Medical: Not on file    Non-medical: Not on file  Tobacco Use  . Smoking status: Never Smoker  . Smokeless tobacco: Never Used  Substance and Sexual Activity  . Alcohol use: No  . Drug use: No  . Sexual activity: Not on file  Lifestyle  . Physical activity:    Days per week: Not on file    Minutes per session: Not on file  . Stress: Not on file  Relationships  . Social connections:    Talks on phone: Not on file    Gets together: Not on file    Attends religious service: Not on file    Active member of club or organization: Not on file    Attends meetings of clubs or organizations: Not on file    Relationship status: Not on file  Other Topics Concern  . Not on file  Social History Narrative  . Not on file     Review of Systems: A 12 point ROS discussed and pertinent positives are indicated in the HPI above.  All other systems are negative.  Review of Systems  Vital Signs: BP (!) 143/57   Pulse (!) 57   Temp 98.6 F (37 C) (Oral)   Resp 16   LMP  (LMP Unknown)   SpO2 95%   Physical Exam  Constitutional: She is oriented to person, place, and time. She appears well-developed. No distress.  HENT:  Head: Normocephalic.  Mouth/Throat: Oropharynx is clear and moist.  Neck: Normal range of motion. No JVD present.  Cardiovascular: Normal rate, regular rhythm and normal heart sounds.  Pulmonary/Chest: Effort normal and breath sounds normal. No respiratory distress.  Abdominal: Soft. She exhibits no distension and no mass. There is no tenderness.  Neurological: She is  alert and oriented to person, place, and time.  Skin: Skin is warm and dry.  Psychiatric: She has a normal mood and affect.    Imaging: Mr Lumbar Spine W Wo Contrast  Result Date: 09/17/2017 CLINICAL DATA:  [place] in March of this year. Previous spinal fusion in January of this year. Right-sided back pain. History of T12 fracture. EXAM: MRI LUMBAR SPINE WITHOUT AND WITH CONTRAST TECHNIQUE: Multiplanar and multiecho pulse sequences of the lumbar spine were obtained without and with intravenous contrast. CONTRAST:  56m MULTIHANCE GADOBENATE DIMEGLUMINE 529  MG/ML IV SOLN COMPARISON:  Radiography 09/05/2017.  MRI 10/06/2016. FINDINGS: Segmentation:  5 lumbar type vertebral bodies. Alignment: Increased kyphotic curvature at the thoracolumbar junction region. Mild scoliotic curvature convex to the right. 2 mm retrolisthesis T12-L1. Vertebrae: Old compression fracture at T12 with loss of height of 50%. Mild residual edema and enhancement without evidence of frank progression. Mild edema and enhancement of the anterior inferior endplate of Q68, likely secondary to impingement stress. Old superior endplate depression at L4 which is completely healed. Previous discectomy and fusion procedure at L5-S1. Conus medullaris and cauda equina: Conus extends to the L1-2 level. Conus and cauda equina appear normal. Paraspinal and other soft tissues: Negative Disc levels: T11-12: Bulging of the disc. No compressive stenosis. At the fracture level of T12, there is mild posterior bowing of the posterior margin of the vertebral body but no significant encroachment upon the canal. T12-L1: 2 mm retrolisthesis. Mild bulging of the disc. No stenosis. L1-2: Mild bulging of the disc.  No stenosis. L2-3: Bulging of the disc more towards the left. Mild narrowing of left lateral recess without definite neural compression. L3-4: Mild bulging of the disc. Mild facet hypertrophy. Mild narrowing of the lateral recesses without visible neural  compression. L4-5: Bulging of the disc. Artifact from the L5-S1 fusion. There is probably no significant stenosis at this level. L5-S1: Previous posterior decompression, diskectomy and fusion. Considerable artifact. No significant finding suspected. IMPRESSION: Old fracture at T12 with loss of height of 50%. Mild posterior bowing but no significant encroachment upon the spinal canal. Bulging of the T11-12 and T12-L1 discs but no compressive stenosis at those levels. There is mild persistent edema and enhancement at the T12 fracture site, consistent with some ongoing healing or remodeling. Mild edema and enhancement at the anterior inferior corner of T11, probably secondary to the fracture. Certainly, the findings in this region could contribute to regional back pain. We do not demonstrate neural compression however. Old healed minor superior endplate fracture at L4 without residual edema or enhancement. Non-compressive disc bulges at other levels as outlined above. Discectomy and fusion at L5-S1. Considerable artifact related to the fusion hardware. No complicating feature identified. Electronically Signed   By: Nelson Chimes M.D.   On: 09/17/2017 16:46   Mr Abdomen Wwo Contrast  Result Date: 09/13/2017 CLINICAL DATA:  Incidental new liver mass on recent chest CT study. EXAM: MRI ABDOMEN WITHOUT AND WITH CONTRAST TECHNIQUE: Multiplanar multisequence MR imaging of the abdomen was performed both before and after the administration of intravenous contrast. CONTRAST:  76m MULTIHANCE GADOBENATE DIMEGLUMINE 529 MG/ML IV SOLN COMPARISON:  09/07/2017 chest CT.  07/22/2016 CT abdomen/pelvis. FINDINGS: Lower chest: Patchy reticular opacities at the lung bases, unchanged. Hepatobiliary: Normal liver size and configuration. No hepatic steatosis. There is a 4.7 x 3.8 cm anterior liver mass involving segments 4B and 5, which demonstrates heterogeneous mild T2 hyperintensity, restricted diffusion and heterogeneous progressive  enhancement, and which appears new since 07/22/2016 CT abdomen/pelvis study. No additional liver masses. Cholecystectomy. Bile ducts are stable and within normal post cholecystectomy limits. Common bile duct diameter 8 mm. No choledocholithiasis. No biliary stricture or mass. Pancreas: No pancreatic mass or duct dilation.  No pancreas divisum. Spleen: Normal size. No mass. Adrenals/Urinary Tract: Normal adrenals. No hydronephrosis. Stable asymmetric moderate left renal atrophy. Stable mild fullness of the right renal collecting system since 07/22/2016 CT. No left hydronephrosis. Bilateral simple renal cysts, largest 2.5 cm in the lateral upper right kidney. No suspicious renal masses. Stomach/Bowel: Normal non-distended  stomach. Visualized small and large bowel is normal caliber, with no bowel wall thickening. Vascular/Lymphatic: Atherosclerotic nonaneurysmal abdominal aorta. Patent portal, splenic, hepatic and renal veins. No pathologically enlarged lymph nodes in the abdomen. Other: No abdominal ascites or focal fluid collection. Musculoskeletal: No aggressive appearing focal osseous lesions. Posterior spinal fusion hardware in the lower lumbar spine. IMPRESSION: 1. Anterior liver 4.7 cm mass involving segment 4B and 5 with progressive heterogeneous enhancement, new since 07/22/2016 CT abdomen study. Liver malignancy is suspected. Differential includes intrahepatic cholangiocarcinoma or solitary liver metastasis from an occult primary. Tissue sampling advised. 2.  Aortic Atherosclerosis (ICD10-I70.0). Electronically Signed   By: Ilona Sorrel M.D.   On: 09/13/2017 17:30   Ct Chest High Resolution  Result Date: 09/07/2017 CLINICAL DATA:  Dyspnea on exertion, worsening for several weeks. Clinical concern for interstitial lung disease. EXAM: CT CHEST WITHOUT CONTRAST TECHNIQUE: Multidetector CT imaging of the chest was performed following the standard protocol without intravenous contrast. High resolution imaging  of the lungs, as well as inspiratory and expiratory imaging, was performed. COMPARISON:  08/08/2017 chest radiograph. 06/11/2016 chest CT angiogram. FINDINGS: Cardiovascular: Borderline mild cardiomegaly. No significant pericardial effusion/thickening. Left main and 3 vessel coronary atherosclerosis. Atherosclerotic nonaneurysmal thoracic aorta. Dilated main pulmonary artery (3.6 cm diameter), stable. Mediastinum/Nodes: No discrete thyroid nodules. Unremarkable esophagus. No axillary adenopathy. Mildly enlarged 1.2 cm right paratracheal node (series 2/image 51) is stable since 06/11/2016. No new pathologically enlarged mediastinal nodes. No discrete hilar adenopathy on these noncontrast images. Stable coarsely calcified right infrahilar nodes from prior granulomatous disease. Lungs/Pleura: No pneumothorax. No pleural effusion. Prominent patchy air trapping throughout both lungs on the expiration sequence. There is patchy subpleural and peribronchovascular reticulation and ground-glass attenuation throughout both lungs with associated scattered minimal traction bronchiolectasis and with mild architectural distortion. No frank honeycombing. Scattered tiny calcified granulomas in the lower lobes. No acute consolidative airspace disease or lung masses. Findings appear mildly worsened since 06/11/2016 chest CT. Upper abdomen: Hypodense 4.1 x 3.8 cm anterior liver mass (series 2/image 125), which appears new since 07/22/2016 CT abdomen study. Colonic diverticulosis. Musculoskeletal: No aggressive appearing focal osseous lesions. Severe T12 vertebral compression fracture, stable since 03/21/2017 lumbar spine radiographs. Moderate thoracic spondylosis. IMPRESSION: 1. Hypodense 4.1 cm anterior liver mass, new since 07/22/2016 CT abdomen study, concerning for a liver metastasis. MRI abdomen without and with IV contrast recommended for further evaluation. 2. Patchy subpleural and peribronchovascular reticulation and  ground-glass attenuation with minimal traction bronchiolectasis, mild architectural distortion and prominent patchy air trapping, mildly worsened since 06/11/2016 chest CT. Findings are most compatible with chronic hypersensitivity pneumonitis. Findings are not compatible with usual interstitial pneumonia (UIP). 3. Mild mediastinal lymphadenopathy, stable since 06/11/2016, most compatible with benign reactive adenopathy. 4. Left main and 3 vessel coronary atherosclerosis. 5. Stable dilated main pulmonary artery, suggesting chronic pulmonary arterial hypertension. Aortic Atherosclerosis (ICD10-I70.0). These results will be called to the ordering clinician or representative by the Radiologist Assistant, and communication documented in the PACS or zVision Dashboard. Electronically Signed   By: Ilona Sorrel M.D.   On: 09/07/2017 16:21   Xr Lumbar Spine 2-3 Views  Result Date: 09/05/2017 T12 compression fracture, this deformity dates back to the first post op xray, not seen on either plain radiographs 09/14/2016 or the MRI in 10/2016. So that it has been between 10/2016 and 03/2017. The position and alignment of the hardware at L5-S1 appears intact and stable. She has chronic scalloping of the end plates likely secondary to Osteoporosis.    Labs:  CBC: Recent Labs    03/31/17 1005 08/08/17 1000 08/31/17 1357 09/22/17 1115  WBC 8.6 7.2 8.0 8.7  HGB 9.7* 12.1 11.1* 12.1  HCT 29.6* 35.7 32.4* 37.2  PLT 482* 319 280.0 361    COAGS: Recent Labs    03/14/17 1012  INR 0.99  APTT 35    BMP: Recent Labs    03/14/17 1012 03/16/17 0500 03/31/17 1005 08/08/17 1000 08/16/17 1046  NA 138 138 141 141 144  K 4.2 3.8 4.4 4.4 4.4  CL 102 102 101 101 101  CO2 _0 GLUCOSE 91 110* 98 105* 94  BUN 22* 18 32* 20 26  CALCIUM 9.5 8.2* 9.6 9.8 9.3  CREATININE 1.05* 1.16* 1.32* 1.10* 1.05*  GFRNONAA 46* 41*  --  45* 48*  GFRAA 54* 48*  --  52* 55*    LIVER FUNCTION TESTS: Recent Labs     03/14/17 1012 03/31/17 1005 08/08/17 1000  BILITOT 0.9 0.4 0.4  AST _1 ALT _2 ALKPHOS 40  --  45  PROT 7.0 6.6 7.0  ALBUMIN 3.8  --  4.4    TUMOR MARKERS: No results for input(s): AFPTM, CEA, CA199, CHROMGRNA in the last 8760 hours.  Assessment and Plan: Liver mass For US guided liver mass biopsy Labs pending Risks and benefits discussed with the patient including, but not limited to bleeding, infection, damage to adjacent structures or low yield requiring additional tests.  All of the patient's questions were answered, patient is agreeable to proceed. Consent signed and in chart.    Thank you for this interesting consult.  I greatly enjoyed meeting Yaneli Keithley and look forward to participating in their care.  A copy of this report was sent to the requesting provider on this date.  Electronically Signed: Ascencion Dike, PA-C 09/22/2017, 12:08 PM   I spent a total of 20 minutes in face to face in clinical consultation, greater than 50% of which was counseling/coordinating care for liver biopsy

## 2017-09-23 ENCOUNTER — Encounter: Payer: Self-pay | Admitting: Cardiology

## 2017-09-23 ENCOUNTER — Ambulatory Visit: Payer: Medicare Other | Admitting: Cardiology

## 2017-09-23 VITALS — BP 112/60 | HR 57 | Ht 62.0 in | Wt 142.0 lb

## 2017-09-23 DIAGNOSIS — N189 Chronic kidney disease, unspecified: Secondary | ICD-10-CM | POA: Diagnosis not present

## 2017-09-23 DIAGNOSIS — I10 Essential (primary) hypertension: Secondary | ICD-10-CM

## 2017-09-23 DIAGNOSIS — I5032 Chronic diastolic (congestive) heart failure: Secondary | ICD-10-CM | POA: Diagnosis not present

## 2017-09-23 NOTE — Patient Instructions (Signed)
Medication Instructions:  The current medical regimen is effective;  continue present plan and medications.  Follow-Up: Follow up in 6 months with Truitt Merle, NP.  You will receive a letter in the mail 2 months before you are due.  Please call us when you receive this letter to schedule your follow up appointment.  Follow up in 1 year with Dr. Marlou Porch.  You will receive a letter in the mail 2 months before you are due.  Please call us when you receive this letter to schedule your follow up appointment.  If you need a refill on your cardiac medications before your next appointment, please call your pharmacy.  Thank you for choosing [facility]!!

## 2017-09-23 NOTE — Progress Notes (Signed)
Cardiology Office Note    Date:  09/23/2017   ID:  Summer Nielsen, DOB 1927-09-05, MRN [id number]  PCP:  Maurice Small, MD  Cardiologist:   Candee Furbish, MD     History of Present Illness:  Summer Nielsen is a 84 y.o. female here for Follow-up of acute diastolic heart failure with elevated BNP, grade 2 diastolic dysfunction on echocardiogram, pulmonary infiltrate.  In July 2017, she began having more shortness of breath and was diagnosed originally with pneumonia. No fever, no chills. Treated with antibiotic course. She never improved that much. Dr. Justin Mend checked an echocardiogram which showed grade 2 diastolic dysfunction and BNP which was 200. She placed her on Lasix 40 mg once a day. Her lower extremity edema has improved. Her breathing is better but not completely resolved. Definitely improved.  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 They are now here with their grand children.  No squeezing CP up neck as she had previously transiently.   09/23/17 -was seeing Dr. Melvyn Novas for evaluation of potential pulmonary fibrosis.  Had liver biopsy, for a 4 cm liver mass seen on CT scan that was being performed for lungs.  She also had coronary artery calcification three-vessel noted.  She has a low risk no ischemia on her nuclear stress test. She is a bit worried about the potential results of this liver mass.  She is going to be seeing Dr. Watt Climes for further evaluation as well.  She is happy however that she did very well with fusion of her back earlier this year.  Overall she is still feeling shortness of breath with activity.  She is not having any chest pain, no syncope, no bleeding.  Past Medical History:  Diagnosis Date  . Cancer ([place])    squamous cell skin cancer on left leg-removed  . CHF (congestive heart  failure) ([place])   . Complication of anesthesia   . GERD (gastroesophageal reflux disease)   . Glaucoma   . High blood pressure   . Hyperlipidemia   . Hypothyroidism   . OP (osteoporosis)   . PONV (postoperative nausea and vomiting)   . Postsurgical hypothyroidism   . 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 Tachycardia   . TIA (transient ischemic attack)     Past Surgical History:  Procedure Laterality Date  . ABDOMINAL HYSTERECTOMY    . CHOLECYSTECTOMY    . EYE SURGERY     removed cateracts on both eyes  . JOINT REPLACEMENT     3 hip replacement on left, right knee replacement  . THYROIDECTOMY      Current Medications: Outpatient Medications Prior to Visit  Medication Sig Dispense Refill  . acetaminophen (TYLENOL) 500 MG tablet Take 1,000 mg by mouth daily as needed for moderate pain or headache.    Marland Kitchen amLODipine (NORVASC) 5 MG tablet Take 5 mg by mouth daily.    Marland Kitchen atorvastatin (LIPITOR) 20 MG tablet Take 20 mg by mouth daily.  3  . bimatoprost (LUMIGAN) 0.01 % SOLN Place 1 drop into both eyes at bedtime.     . calcium-vitamin D (OSCAL WITH D) 500-200 MG-UNIT tablet Take 1 tablet by mouth daily.     . furosemide (LASIX) 40 MG tablet Take 40 mg by mouth 2 (two) times daily.    Marland Kitchen  gabapentin (NEURONTIN) 300 MG capsule Take 1 capsule (300 mg total) by mouth 3 (three) times daily. 270 capsule 3  . ibuprofen (ADVIL,MOTRIN) 200 MG tablet Take 200 mg by mouth every 6 (six) hours as needed for moderate pain (back surgery).    . Iron-FA-B Cmp-C-Biot-Probiotic (FUSION PLUS) CAPS Take 1 capsule by mouth daily.    Marland Kitchen levothyroxine (SYNTHROID, LEVOTHROID) 100 MCG tablet Take 100 mcg daily by mouth.  11  . losartan (COZAAR) 50 MG tablet Take 50 mg by mouth daily.    . Multiple Vitamins-Minerals (CENTRUM SILVER PO) Take 1 tablet by mouth daily.     . naproxen (NAPROSYN) 375 MG tablet Take 1 tablet (375 mg total) by mouth 2 (two) times daily. 6 tablet 0  .  omeprazole (PRILOSEC) 20 MG capsule Take 30- 60 min before your first and last meals of the day    . ondansetron (ZOFRAN) 4 MG tablet Take 4 mg by mouth every 6 (six) hours as needed for nausea or vomiting.    . propranolol (INDERAL) 20 MG tablet Take 20 mg by mouth 2 (two) times daily.     . traMADol-acetaminophen (ULTRACET) 37.5-325 MG tablet Take 1 tablet by mouth every 6 (six) hours as needed. 30 tablet 0   No facility-administered medications prior to visit.      Allergies:   Levofloxacin and Percocet [oxycodone-acetaminophen]   Social History   Socioeconomic History  . Marital status: Married    Spouse name: Not on file  . Number of children: Not on file  . Years of education: Not on file  . Highest education level: Not on file  Occupational History  . Not on file  Social Needs  . Financial resource strain: Not on file  . Food insecurity:    Worry: Not on file    Inability: Not on file  . Transportation needs:    Medical: Not on file    Non-medical: Not on file  Tobacco Use  . Smoking status: Never Smoker  . Smokeless tobacco: Never Used  Substance and Sexual Activity  . Alcohol use: No  . Drug use: No  . Sexual activity: Not on file  Lifestyle  . Physical activity:    Days per week: Not on file    Minutes per session: Not on file  . Stress: Not on file  Relationships  . Social connections:    Talks on phone: Not on file    Gets together: Not on file    Attends religious service: Not on file    Active member of club or organization: Not on file    Attends meetings of clubs or organizations: Not on file    Relationship status: Not on file  Other Topics Concern  . Not on file  Social History Narrative  . Not on file     Family History:  The patient's father died of MI. 19's.    ROS:   Please see the history of present illness.    Review of Systems  All other systems reviewed and are negative.     PHYSICAL EXAM:   VS:  BP 112/60   Pulse (!) 57   Ht  _0  (1.575 m)   Wt 142 lb (64.4 kg)   LMP  (LMP Unknown)   SpO2 95%   BMI 25.97 kg/m    GEN: Well nourished, well developed, in no acute distress  HEENT: normal  Neck: no JVD, carotid bruits, or masses Cardiac: RRR; no murmurs,  rubs, or gallops,no edema  Respiratory: Mild crackles heard bilaterally, normal work of breathing GI: soft, nontender, nondistended, + BS, liver biopsy performed yesterday MS: no deformity or atrophy  Skin: warm and dry, no rash Neuro:  Alert and Oriented x 3, Strength and sensation are intact Psych: euthymic mood, full affect    Wt Readings from Last 3 Encounters:  09/23/17 142 lb (64.4 kg)  09/20/17 142 lb (64.4 kg)  09/05/17 142 lb (64.4 kg)      Studies/Labs Reviewed:   EKG: 09/23/2017-sinus bradycardia 57 with no other specific abnormalities personally viewed.  01/08/16-sinus bradycardia rate 58 with no other abnormalities. Personally viewed.  Recent Labs: 08/08/2017: ALT 15; TSH 9.900 08/16/2017: BUN 26; Creatinine, Ser 1.05; NT-Pro BNP 655; Potassium 4.4; Sodium 144 09/22/2017: Hemoglobin 12.1; Platelets 361   Lipid Panel No results found for: CHOL, TRIG, HDL, CHOLHDL, VLDL, LDLCALC, LDLDIRECT  Additional studies/ records that were reviewed today include:   ECHO 10/24/15: - Left ventricle: The cavity size was normal. Systolic function was   normal. The estimated ejection fraction was in the range of 60%   to 65%. Wall motion was normal; there were no regional wall   motion abnormalities. Features are consistent with a pseudonormal   left ventricular filling pattern, with concomitant abnormal   relaxation and increased filling pressure (grade 2 diastolic   dysfunction). - Aortic valve: There was mild regurgitation. - Mitral valve: Calcified annulus. - Pulmonary arteries: Systolic pressure was mildly increased. PA   peak pressure: 34 mm Hg (S).  Nuclear stress test 06/07/2016-low risk with no ischemia  CT scan, high-resolution on 09/07/2017-4  cm liver mass noted.  Also three-vessel coronary artery calcification noted as well.  Prior lab work, office notes, EKG reviewed. Hemoglobin 11. Creatinine 0.8. BNP 200.   ASSESSMENT:    1. Essential hypertension   2. Chronic diastolic CHF (congestive heart failure) ([place])   3. Chronic kidney disease, unspecified CKD stage      PLAN:  In order of problems listed above:  Chronic diastolic heart failure  - Continue with Lasix 40 mg twice a day.  -Previously lost a significant amount of fluid weight in her lower extremities, no changes made.  - Continue with daily weights, salt restriction, fluid restriction 1.5 L a day.  Stable on current medications including losartan 50, amlodipine 5, atorvastatin 20, Inderal 20 twice a day, Lasix 40 mg twice a day.  Coronary artery calcification three-vessel - Nuclear stress test was reassuring with no evidence of ischemia.  Given her current liver biopsy, continue with aggressive management medically.  She is not having any anginal symptoms.  CKD 3  - 1.45, continuing to monitor.  Secondary pulmonary hypertension  - Very mild, likely a result of her diastolic dysfunction and perhaps underlying pulmonary disease. No change   Essential hypertension  - Improved with Lasix. Watch renal function. On amlodipine, losartan, propranolol.  Basic metabolic profile stable.  Continue to monitor..   Bradycardia  - Improved since decreasing beta blocker dose. No syncope, doing well. Stable, no changes made.  Awaiting results of liver biopsy.   Medication Adjustments/Labs and Tests Ordered: Current medicines are reviewed at length with the patient today.  Concerns regarding medicines are outlined above.  Medication changes, Labs and Tests ordered today are listed in the Patient Instructions below. Patient Instructions  Medication Instructions:  The current medical regimen is effective;  continue present plan and medications.  Follow-Up: Follow up in 6  months with Truitt Merle, NP.  You will receive a letter in the mail 2 months before you are due.  Please call us when you receive this letter to schedule your follow up appointment.  Follow up in 1 year with Dr. Marlou Porch.  You will receive a letter in the mail 2 months before you are due.  Please call us when you receive this letter to schedule your follow up appointment.  If you need a refill on your cardiac medications before your next appointment, please call your pharmacy.  Thank you for choosing [facility]!!        Signed, Candee Furbish, MD  09/23/2017 9:47 AM    [facility] [place], [place], [place]  [address] Phone: [phone]; Fax: [phone]

## 2017-09-27 ENCOUNTER — Ambulatory Visit: Payer: Medicare Other | Admitting: Internal Medicine

## 2017-10-04 ENCOUNTER — Encounter: Payer: Self-pay | Admitting: *Deleted

## 2017-10-05 ENCOUNTER — Ambulatory Visit (INDEPENDENT_AMBULATORY_CARE_PROVIDER_SITE_OTHER): Payer: Medicare Other | Admitting: Specialist

## 2017-10-05 ENCOUNTER — Telehealth: Payer: Self-pay | Admitting: Oncology

## 2017-10-05 ENCOUNTER — Inpatient Hospital Stay: Payer: Medicare Other | Attending: Oncology | Admitting: Oncology

## 2017-10-05 ENCOUNTER — Inpatient Hospital Stay: Payer: Medicare Other

## 2017-10-05 ENCOUNTER — Encounter: Payer: Self-pay | Admitting: Radiation Oncology

## 2017-10-05 VITALS — BP 162/66 | HR 69 | Temp 99.0°F | Resp 17 | Ht 62.0 in | Wt 140.9 lb

## 2017-10-05 DIAGNOSIS — I5032 Chronic diastolic (congestive) heart failure: Secondary | ICD-10-CM | POA: Diagnosis not present

## 2017-10-05 DIAGNOSIS — Z8673 Personal history of transient ischemic attack (TIA), and cerebral infarction without residual deficits: Secondary | ICD-10-CM | POA: Insufficient documentation

## 2017-10-05 DIAGNOSIS — R1310 Dysphagia, unspecified: Secondary | ICD-10-CM | POA: Diagnosis not present

## 2017-10-05 DIAGNOSIS — I110 Hypertensive heart disease with heart failure: Secondary | ICD-10-CM | POA: Diagnosis not present

## 2017-10-05 DIAGNOSIS — Z90710 Acquired absence of both cervix and uterus: Secondary | ICD-10-CM | POA: Diagnosis not present

## 2017-10-05 DIAGNOSIS — Z79899 Other long term (current) drug therapy: Secondary | ICD-10-CM | POA: Diagnosis not present

## 2017-10-05 DIAGNOSIS — H409 Unspecified glaucoma: Secondary | ICD-10-CM | POA: Diagnosis not present

## 2017-10-05 DIAGNOSIS — C801 Malignant (primary) neoplasm, unspecified: Secondary | ICD-10-CM

## 2017-10-05 DIAGNOSIS — R1011 Right upper quadrant pain: Secondary | ICD-10-CM | POA: Insufficient documentation

## 2017-10-05 DIAGNOSIS — Z85828 Personal history of other malignant neoplasm of skin: Secondary | ICD-10-CM

## 2017-10-05 DIAGNOSIS — C227 Other specified carcinomas of liver: Secondary | ICD-10-CM

## 2017-10-05 DIAGNOSIS — K219 Gastro-esophageal reflux disease without esophagitis: Secondary | ICD-10-CM | POA: Insufficient documentation

## 2017-10-05 DIAGNOSIS — E785 Hyperlipidemia, unspecified: Secondary | ICD-10-CM | POA: Diagnosis not present

## 2017-10-05 NOTE — Progress Notes (Signed)
Organ New Patient Consult   Requesting [id number]  Summer Nielsen 84 y.o.  04-25-1927    Reason for Consult: Liver mass-adenocarcinoma   HPI: Summer Nielsen was referred to Dr. Fransico Setters for evaluation of dyspnea.  His evaluation was suspicious for pulmonary fibrosis.  Summer Nielsen was referred for CT of the chest on 09/07/2017.  A mildly enlarged 1.2 cm right paratracheal node was unchanged compared to April 2018.  Patchy subpleural and peribronchovascular reticulation and groundglass attenuation was noted in both lungs.  No lung masses.  A hypodense mass was noted in the anterior liver measuring 4.1 x 3.8 cm, new compared to a CT from 07/22/2016. Summer Nielsen was referred for an MRI of the abdomen on 09/13/2017.  This confirmed a 4.7 x 3.8 cm anterior liver mass.  The mass is new compared to a CT from May 2018.  No additional liver lesions.  No pancreas mass.  No pathologically enlarged lymph nodes in the abdomen.  No ascites.  No aggressive osseous lesions.  The differential diagnosis includes an intrahepatic cholangiocarcinoma or a solitary liver metastasis.  Summer Nielsen was referred for an ultrasound-guided biopsy of the liver mass on 09/22/2017.  The pathology revealed adenocarcinoma.  The tumor stained positive for cytokeratin 7.  The tumor cells stained and negative for cytokeratin 20, CDX 2, TTF-1, and napsin A Differential diagnosis includes pancreaticobiliary, upper gastrointestinal, and lung primaries.  .  Past Medical History:  Diagnosis Date  . Cancer ([place])    squamous cell skin cancer on left leg-removed  . CHF (congestive heart failure) ([place])   . Complication of anesthesia   . GERD (gastroesophageal reflux disease)   . Glaucoma   . High blood pressure   . Hyperlipidemia   . Hypothyroidism   . OP (osteoporosis)   . PONV (postoperative nausea and vomiting)   . Postsurgical hypothyroidism   . 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 Tachycardia   . TIA  (transient ischemic attack)     .  G3, P3 History of anemia  .    Past Surgical History:  Procedure Laterality Date  . ABDOMINAL HYSTERECTOMY    . CHOLECYSTECTOMY    . EYE SURGERY     removed cateracts on both eyes  . JOINT REPLACEMENT     3 hip replacement on left, right knee replacement  . THYROIDECTOMY      .   Colonoscopy approximately 2 years ago  Medications: Reviewed  Allergies: Allergen Reactions  . Levofloxacin Nausea Only    Dizzy, hot, foot pain, insomnia  . Percocet [Oxycodone-Acetaminophen] Nausea And Vomiting and Other (See Comments)    sick    Family history: A brother had colon cancer.  No other family history of cancer  Social History:   Summer Nielsen lives with her husband in [place].  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  Summer Nielsen does not use cigarettes.  Rare alcohol use.  Summer Nielsen has received a red cell transfusion in the past.  ROS:   Positives include:intermittent choking when drinking liquids-present for years, sore throat a few weeks ago, pain at the rt. Upper abdomen-worse when getting out of bed,   A complete ROS was otherwise negative.  Physical Exam:  Blood pressure (!) 162/66, pulse 69, temperature 99 F (37.2 C), temperature source Oral, resp. rate 17, height _0  (1.575 m), weight 140 lb 14.4 oz (63.9 kg), SpO2 94 %.  HEENT:oropharynx without mass, neck without mass Lungs: rales at  the lower posterior chest bilaterally Cardiac:  RRR Abdomen: tender in the rt. Subcostal area, no HSM, no mass, no ascites Breasts:bilateral breast without mass  Vascular: no leg edema Lymph nodes: no cervical, supracalvicular, axillary, or inguinal nodes Neurologic: motor exam intact in upper and lower extemities Skin: no rash Musculoskeletal: no spine tenderness   LAB:  CBC  Lab Results  Component Value Date   WBC 8.7 09/22/2017   HGB 12.1 09/22/2017   HCT 37.2 09/22/2017   MCV 97.1 09/22/2017   PLT 361 09/22/2017    NEUTROABS 4.7 08/31/2017        CMP  Lab Results  Component Value Date   NA 144 08/16/2017   K 4.4 08/16/2017   CL 101 08/16/2017   CO2 26 08/16/2017   GLUCOSE 94 08/16/2017   BUN 26 08/16/2017   CREATININE 1.05 (H) 08/16/2017   CALCIUM 9.3 08/16/2017   PROT 7.0 08/08/2017   ALBUMIN 4.4 08/08/2017   AST 16 08/08/2017   ALT 15 08/08/2017   ALKPHOS 45 08/08/2017   BILITOT 0.4 08/08/2017   GFRNONAA 48 (L) 08/16/2017   GFRAA 55 (L) 08/16/2017      Imaging: CT 09/07/2017 and MRi 09/13/2017- images reviewed with patient and son   Assessment/Plan:   1. Anterior liver mass, ultrasound-guided biopsy 09/22/2017-adenocarcinoma  CT chest 09/07/2017, 4.1 cm hypodense anterior liver mass, changes of chronic hypersensitivity pneumonitis  MRI abdomen 09/13/2017-isolated liver mass measuring 4.7 x 3.8 cm involving segments 4B and 5, no additional liver masses 2. Pain secondary to #1 3. History of diastolic heart failure 4. Hypertension 5. G3, P3 6. History of anemia   Disposition:   Summer Nielsen has been diagnosed with adenocarcinoma involving a liver mass.  There is an isolated liver mass based on the staging evaluation to date.  The differential diagnosis includes an intrahepatic cholangiocarcinoma versus metastatic disease from another primary tumor site.  There is no clinical or x-ray evidence of a primary tumor site outside of the liver.  I suspect the mass is related to an intrahepatic cholangiocarcinoma.  We checked a CEA and CA 19-9 today.  I will present her case at the GI tumor conference 10/12/2017.  I will refer her to Dr. Lisbeth Renshaw to consider definitive radiation.  We also discussed radiofrequency ablation, hepatic embolization therapy, and systemic chemotherapy options.  Summer Nielsen will continue tramadol as needed for pain.  Summer Nielsen will return for an office visit in 1 month.  I am available to see her sooner as needed.    Betsy Coder, MD  10/05/2017, 3:54 PM

## 2017-10-05 NOTE — Telephone Encounter (Signed)
Scheduled appt per 7/31 los - gave patient AVS and calender per los.

## 2017-10-06 LAB — CANCER ANTIGEN 19-9: CA 19-9: 49 U/mL — ABNORMAL HIGH (ref 0–35)

## 2017-10-06 LAB — CEA (IN HOUSE-CHCC): CEA (CHCC-In House): 2.04 ng/mL (ref 0.00–5.00)

## 2017-10-06 NOTE — Progress Notes (Signed)
Met with patient at initial oncology consult with Dr. Benay Spice. Provided my contact information and explained the role of nurse navigator. Patient lives with her husband and her son livess close by, she has transportation and health insurance. No barriers to treatment identified. I will  Reach out to patient as her plan of treatment progresses to assess for education needs.

## 2017-10-07 ENCOUNTER — Telehealth: Payer: Self-pay

## 2017-10-07 NOTE — Telephone Encounter (Addendum)
Pt voiced understanding of message below   ----- Message from Ladell Pier, MD sent at 10/06/2017  9:34 AM EDT ----- Please call patient, the tumor marker for bile duct cancer is mildly elevated, proceed with radiation evaluation

## 2017-10-12 NOTE — Progress Notes (Signed)
Histology and Location of Primary Cancer: Unknown primary location.  Location(s) of Symptomatic tumor(s): Liver  Patient's main complaints related to symptomatic tumor(s) are: Patient presented to Dr. Fransico Setters for evaluation of dyspnea.  Cardiologist sent the patient to the pulmonologist.  CT Chest 09/07/2017: mildly enlarged 1.2 cm right paratracheal node was unchanged compared to April 2018.  Patchy subpleural and peribronchovascular reticulation and groundglass attenuation was noted in both lungs.  No lung masses.  A hypodense mass was noted in the anterior liver measuring 4.1 x 3.8 cm, new compared to a CT from 07/22/2016.  MRI abdomen 09/13/2017: 4.7 x 3.8 cm anterior liver mass.  No additional liver lesions, no pancreas mass, no pathologically enlarged lymph nodes in the abdomen.  No ascites, no aggressive osseous lesions.   The differential diagnosis includes an intrahepatic cholangiocarcinoma or a solitary liver metastasis.  Biopsy: Liver 09/22/2017   * Tumor stained positive for cytokeratin 7.  The tumor cells stained and negative for cytokeratin 20, CDX 2, TTF-1, and napsin, a differential diagnosis includes pancreaticobiliary, upper gastrointestinal, and lung primaries.  Past/Anticipated chemotherapy by medical oncology, if any:  Dr. Benay Spice 10/05/2017 -The differential diagnosis includes an intrahepatic cholangiocarcinoma versus metastatic disease from another primary tumor site.  There is no clinical or x-ray evidence of a primary tumor site outside of the liver.  I suspect the mass is related to an intrahepatic cholangiocarcinoma. -We checked a CEA and CA 19-9 today.  I will present her case at the GI tumor conference 10/12/2017.  I will refer her to Dr. Lisbeth Renshaw to consider definitive radiation.  We also discussed radiofrequency ablation, hepatic embolization therapy, and systemic chemotherapy options.   Pain on a scale of 0-10 is: Just having some right side pain getting in and out of bed.   After getting up and moving around the pain goes away.  BP (!) 152/62 (BP Location: Left Arm, Patient Position: Sitting)   Pulse 68   Temp 98.3 F (36.8 C) (Oral)   Resp 20   Ht _0  (1.575 m)   Wt 141 lb 12.8 oz (64.3 kg)   LMP  (LMP Unknown)   SpO2 97%   BMI 25.94 kg/m    Wt Readings from Last 3 Encounters:  10/13/17 141 lb 12.8 oz (64.3 kg)  10/05/17 140 lb 14.4 oz (63.9 kg)  09/23/17 142 lb (64.4 kg)   SAFETY ISSUES:  Prior radiation? No  Pacemaker/ICD? NoPossible current pregnancy? No  Is the patient on methotrexate? No  Additional Complaints / other details:  Patient was having some pain getting in and out of bed.

## 2017-10-13 ENCOUNTER — Other Ambulatory Visit: Payer: Self-pay

## 2017-10-13 ENCOUNTER — Encounter: Payer: Self-pay | Admitting: Radiation Oncology

## 2017-10-13 ENCOUNTER — Ambulatory Visit
Admission: RE | Admit: 2017-10-13 | Discharge: 2017-10-13 | Disposition: A | Discharge status: Home / Self Care | Payer: Medicare Other | Source: Ambulatory Visit | Attending: Radiation Oncology | Admitting: Radiation Oncology

## 2017-10-13 VITALS — BP 152/62 | HR 68 | Temp 98.3°F | Resp 20 | Ht 62.0 in | Wt 141.8 lb

## 2017-10-13 DIAGNOSIS — Z885 Allergy status to narcotic agent status: Secondary | ICD-10-CM | POA: Insufficient documentation

## 2017-10-13 DIAGNOSIS — C229 Malignant neoplasm of liver, not specified as primary or secondary: Secondary | ICD-10-CM

## 2017-10-13 DIAGNOSIS — Z96642 Presence of left artificial hip joint: Secondary | ICD-10-CM | POA: Insufficient documentation

## 2017-10-13 DIAGNOSIS — Z85828 Personal history of other malignant neoplasm of skin: Secondary | ICD-10-CM | POA: Diagnosis not present

## 2017-10-13 DIAGNOSIS — E039 Hypothyroidism, unspecified: Secondary | ICD-10-CM | POA: Insufficient documentation

## 2017-10-13 DIAGNOSIS — C221 Intrahepatic bile duct carcinoma: Secondary | ICD-10-CM | POA: Insufficient documentation

## 2017-10-13 DIAGNOSIS — I509 Heart failure, unspecified: Secondary | ICD-10-CM | POA: Insufficient documentation

## 2017-10-13 DIAGNOSIS — M810 Age-related osteoporosis without current pathological fracture: Secondary | ICD-10-CM | POA: Diagnosis not present

## 2017-10-13 DIAGNOSIS — Z8673 Personal history of transient ischemic attack (TIA), and cerebral infarction without residual deficits: Secondary | ICD-10-CM | POA: Insufficient documentation

## 2017-10-13 DIAGNOSIS — Z79899 Other long term (current) drug therapy: Secondary | ICD-10-CM | POA: Insufficient documentation

## 2017-10-13 DIAGNOSIS — I110 Hypertensive heart disease with heart failure: Secondary | ICD-10-CM | POA: Diagnosis not present

## 2017-10-13 NOTE — Progress Notes (Addendum)
Radiation Oncology         [phone] ________________________________  Name: Summer Nielsen        MRN: [id number]  Date of Service: 10/13/2017 DOB: 1927/06/23  [id number]:[id number], [name], MD  Ladell Pier, MD     REFERRING PHYSICIAN: Ladell Pier, MD     Attestation Please see the note from Shona Simpson, PA-C from today's visit for more details of today's encounter.  I have personally performed a face to face diagnostic evaluation on this patient and devised the following assessment and plan.  The patient was seen today regarding her diagnosis of a cholangiocarcinoma.  She was discussed in multidisciplinary GI tumor board.  Given the size, location, and age of the patient, it was felt that a course of focal radiation treatment would be a good option for her at this time.  I therefore have discussed with her stereotactic body radiation treatment which I will believe would be feasible.  She is interested in proceeding with this treatment.  All of her questions were answered.  She will undergo simulation next week for treatment planning.  Staging -stage I cholangiocarcinoma  Kyung Rudd, MD      DIAGNOSIS: The encounter diagnosis was Adenocarcinoma of liver ([place]).   HISTORY OF PRESENT ILLNESS: Summer Nielsen is a 84 y.o. female seen at the request of Dr. Malachy Mood for a newly diagnosed adenocarcinoma most consistent with cholangiocarcinoma.  The patient presented for evaluation to pulmonary medicine with dyspnea on exertion.  A CT scan of the chest was ordered which revealed a 1.2 cm paratracheal node, noted lung mass, fibrotic changes of the lung, and a possible mass within the liver.  Of note in retrospect, she has been complaining of pain with twisting and turning in the right upper quadrant for some time following a spinal surgery.  She underwent an MRI of the abdomen on 09/13/2017 revealed a 4.7 x 3.8 cm mass involving segments 4B and 5, and her bile ducts were stable within normal  postcholecystectomy limits.  Her common bile duct measured 8 mm.  No additional adenopathy or concerns for disease elsewhere in the abdomen were noted at the time.  She underwent ultrasound-guided biopsy of this mass on 09/22/2017 revealing adenocarcinoma consistent with either pancreatobiliary, GI, or lung primary.  She was discussed yesterday in GI conference, and the conclusion is that her cancer represents cholangiocarcinoma.  She comes today to discuss options of treatment.    PREVIOUS RADIATION THERAPY: No   PAST MEDICAL HISTORY:  Past Medical History:  Diagnosis Date  . Cancer ([place])    squamous cell skin cancer on left leg-removed  . CHF (congestive heart failure) ([place])   . Complication of anesthesia   . GERD (gastroesophageal reflux disease)   . Glaucoma   . High blood pressure   . Hyperlipidemia   . Hypothyroidism   . OP (osteoporosis)   . PONV (postoperative nausea and vomiting)   . Postsurgical hypothyroidism   . 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 Tachycardia   . TIA (transient ischemic attack)        PAST SURGICAL HISTORY: Past Surgical History:  Procedure Laterality Date  . ABDOMINAL HYSTERECTOMY    . BACK SURGERY N/A 03/2017   Lumbar  . CHOLECYSTECTOMY    . EYE SURGERY     removed cateracts on both eyes  . JOINT REPLACEMENT     3 hip replacement on left, right knee replacement  . THYROIDECTOMY  FAMILY HISTORY:  Family History  Problem Relation Age of Onset  . Heart attack Mother   . Cerebral aneurysm Mother   . Stroke Mother   . Hypertension Mother   . Heart attack Father   . Heart disease Sister   . Heart disease Brother      SOCIAL HISTORY:  reports that she has never smoked. She has never used smokeless tobacco. She reports that she does not drink alcohol or use drugs.   ALLERGIES: Levofloxacin and Percocet [oxycodone-acetaminophen]   MEDICATIONS:  Current Outpatient Medications  Medication Sig  Dispense Refill  . acetaminophen (TYLENOL) 500 MG tablet Take 1,000 mg by mouth daily as needed for moderate pain or headache.    Marland Kitchen amLODipine (NORVASC) 5 MG tablet Take 5 mg by mouth daily.    Marland Kitchen atorvastatin (LIPITOR) 20 MG tablet Take 20 mg by mouth daily.  3  . bimatoprost (LUMIGAN) 0.01 % SOLN Place 1 drop into both eyes at bedtime.     . calcium-vitamin D (OSCAL WITH D) 500-200 MG-UNIT tablet Take 1 tablet by mouth daily.     . furosemide (LASIX) 40 MG tablet Take 40 mg by mouth 2 (two) times daily.    Marland Kitchen gabapentin (NEURONTIN) 300 MG capsule Take 1 capsule (300 mg total) by mouth 3 (three) times daily. 270 capsule 3  . ibuprofen (ADVIL,MOTRIN) 200 MG tablet Take 200 mg by mouth every 6 (six) hours as needed for moderate pain (back surgery).    . Iron-FA-B Cmp-C-Biot-Probiotic (FUSION PLUS) CAPS Take 1 capsule by mouth daily.    Marland Kitchen levothyroxine (SYNTHROID, LEVOTHROID) 100 MCG tablet Take 100 mcg daily by mouth.  11  . losartan (COZAAR) 50 MG tablet Take 50 mg by mouth daily.    . Multiple Vitamins-Minerals (CENTRUM SILVER PO) Take 1 tablet by mouth daily.     . naproxen (NAPROSYN) 375 MG tablet Take 1 tablet (375 mg total) by mouth 2 (two) times daily. 6 tablet 0  . omeprazole (PRILOSEC) 20 MG capsule Take 30- 60 min before your first and last meals of the day    . ondansetron (ZOFRAN) 4 MG tablet Take 4 mg by mouth every 6 (six) hours as needed for nausea or vomiting.    . propranolol (INDERAL) 20 MG tablet Take 20 mg by mouth 2 (two) times daily.     . traMADol-acetaminophen (ULTRACET) 37.5-325 MG tablet Take 1 tablet by mouth every 6 (six) hours as needed. 30 tablet 0   No current facility-administered medications for this encounter.      REVIEW OF SYSTEMS: On review of systems, the patient reports that she is doing well overall.  She reports discomfort when twisting or turning particularly after getting out of her car. She denies any chest pain, shortness of breath, cough, fevers,  chills, night sweats, unintended weight changes. She denies any bowel or bladder disturbances, and denies nausea or vomiting. She denies any new musculoskeletal or joint aches or pains. A complete review of systems is obtained and is otherwise negative.     PHYSICAL EXAM:  Wt Readings from Last 3 Encounters:  10/13/17 141 lb 12.8 oz (64.3 kg)  10/05/17 140 lb 14.4 oz (63.9 kg)  09/23/17 142 lb (64.4 kg)   Temp Readings from Last 3 Encounters:  10/13/17 98.3 F (36.8 C) (Oral)  10/05/17 99 F (37.2 C) (Oral)  09/22/17 97.8 F (36.6 C) (Oral)   BP Readings from Last 3 Encounters:  10/13/17 (!) 152/62  10/05/17 (!) 162/66  09/23/17 112/60   Pulse Readings from Last 3 Encounters:  10/13/17 68  10/05/17 69  09/23/17 (!) 57   Pain Assessment Pain Score: 0-No pain/10 In general this is a well appearing Caucasian female in no acute distress.  She's alert and oriented x4 and appropriate throughout the examination. Cardiopulmonary assessment is negative for acute distress and she exhibits normal effort.    ECOG = 1  0 - Asymptomatic (Fully active, able to carry on all predisease activities without restriction)  1 - Symptomatic but completely ambulatory (Restricted in physically strenuous activity but ambulatory and able to carry out work of a light or sedentary nature. For example, light housework, office work)  2 - Symptomatic, <50% in bed during the day (Ambulatory and capable of all self care but unable to carry out any work activities. Up and about more than 50% of waking hours)  3 - Symptomatic, >50% in bed, but not bedbound (Capable of only limited self-care, confined to bed or chair 50% or more of waking hours)  4 - Bedbound (Completely disabled. Cannot carry on any self-care. Totally confined to bed or chair)  5 - Death   Eustace Pen MM, Creech RH, Tormey DC, et al. [id number]). "Toxicity and response criteria of the [facility]". [place] Oncol. 5 (6):  649-55    LABORATORY DATA:  Lab Results  Component Value Date   WBC 8.7 09/22/2017   HGB 12.1 09/22/2017   HCT 37.2 09/22/2017   MCV 97.1 09/22/2017   PLT 361 09/22/2017   Lab Results  Component Value Date   NA 144 08/16/2017   K 4.4 08/16/2017   CL 101 08/16/2017   CO2 26 08/16/2017   Lab Results  Component Value Date   ALT 15 08/08/2017   AST 16 08/08/2017   ALKPHOS 45 08/08/2017   BILITOT 0.4 08/08/2017      RADIOGRAPHY: Mr Lumbar Spine W Wo Contrast  Result Date: 09/17/2017 CLINICAL DATA:  [place] in March of this year. Previous spinal fusion in January of this year. Right-sided back pain. History of T12 fracture. EXAM: MRI LUMBAR SPINE WITHOUT AND WITH CONTRAST TECHNIQUE: Multiplanar and multiecho pulse sequences of the lumbar spine were obtained without and with intravenous contrast. CONTRAST:  20m MULTIHANCE GADOBENATE DIMEGLUMINE 529 MG/ML IV SOLN COMPARISON:  Radiography 09/05/2017.  MRI 10/06/2016. FINDINGS: Segmentation:  5 lumbar type vertebral bodies. Alignment: Increased kyphotic curvature at the thoracolumbar junction region. Mild scoliotic curvature convex to the right. 2 mm retrolisthesis T12-L1. Vertebrae: Old compression fracture at T12 with loss of height of 50%. Mild residual edema and enhancement without evidence of frank progression. Mild edema and enhancement of the anterior inferior endplate of TR48 likely secondary to impingement stress. Old superior endplate depression at L4 which is completely healed. Previous discectomy and fusion procedure at L5-S1. Conus medullaris and cauda equina: Conus extends to the L1-2 level. Conus and cauda equina appear normal. Paraspinal and other soft tissues: Negative Disc levels: T11-12: Bulging of the disc. No compressive stenosis. At the fracture level of T12, there is mild posterior bowing of the posterior margin of the vertebral body but no significant encroachment upon the canal. T12-L1: 2 mm retrolisthesis. Mild bulging of  the disc. No stenosis. L1-2: Mild bulging of the disc.  No stenosis. L2-3: Bulging of the disc more towards the left. Mild narrowing of left lateral recess without definite neural compression. L3-4: Mild bulging of the disc. Mild facet hypertrophy. Mild narrowing of the lateral recesses without  visible neural compression. L4-5: Bulging of the disc. Artifact from the L5-S1 fusion. There is probably no significant stenosis at this level. L5-S1: Previous posterior decompression, diskectomy and fusion. Considerable artifact. No significant finding suspected. IMPRESSION: Old fracture at T12 with loss of height of 50%. Mild posterior bowing but no significant encroachment upon the spinal canal. Bulging of the T11-12 and T12-L1 discs but no compressive stenosis at those levels. There is mild persistent edema and enhancement at the T12 fracture site, consistent with some ongoing healing or remodeling. Mild edema and enhancement at the anterior inferior corner of T11, probably secondary to the fracture. Certainly, the findings in this region could contribute to regional back pain. We do not demonstrate neural compression however. Old healed minor superior endplate fracture at L4 without residual edema or enhancement. Non-compressive disc bulges at other levels as outlined above. Discectomy and fusion at L5-S1. Considerable artifact related to the fusion hardware. No complicating feature identified. Electronically Signed   By: Nelson Chimes M.D.   On: 09/17/2017 16:46   Mr Abdomen Wwo Contrast  Result Date: 09/13/2017 CLINICAL DATA:  Incidental new liver mass on recent chest CT study. EXAM: MRI ABDOMEN WITHOUT AND WITH CONTRAST TECHNIQUE: Multiplanar multisequence MR imaging of the abdomen was performed both before and after the administration of intravenous contrast. CONTRAST:  34m MULTIHANCE GADOBENATE DIMEGLUMINE 529 MG/ML IV SOLN COMPARISON:  09/07/2017 chest CT.  07/22/2016 CT abdomen/pelvis. FINDINGS: Lower chest:  Patchy reticular opacities at the lung bases, unchanged. Hepatobiliary: Normal liver size and configuration. No hepatic steatosis. There is a 4.7 x 3.8 cm anterior liver mass involving segments 4B and 5, which demonstrates heterogeneous mild T2 hyperintensity, restricted diffusion and heterogeneous progressive enhancement, and which appears new since 07/22/2016 CT abdomen/pelvis study. No additional liver masses. Cholecystectomy. Bile ducts are stable and within normal post cholecystectomy limits. Common bile duct diameter 8 mm. No choledocholithiasis. No biliary stricture or mass. Pancreas: No pancreatic mass or duct dilation.  No pancreas divisum. Spleen: Normal size. No mass. Adrenals/Urinary Tract: Normal adrenals. No hydronephrosis. Stable asymmetric moderate left renal atrophy. Stable mild fullness of the right renal collecting system since 07/22/2016 CT. No left hydronephrosis. Bilateral simple renal cysts, largest 2.5 cm in the lateral upper right kidney. No suspicious renal masses. Stomach/Bowel: Normal non-distended stomach. Visualized small and large bowel is normal caliber, with no bowel wall thickening. Vascular/Lymphatic: Atherosclerotic nonaneurysmal abdominal aorta. Patent portal, splenic, hepatic and renal veins. No pathologically enlarged lymph nodes in the abdomen. Other: No abdominal ascites or focal fluid collection. Musculoskeletal: No aggressive appearing focal osseous lesions. Posterior spinal fusion hardware in the lower lumbar spine. IMPRESSION: 1. Anterior liver 4.7 cm mass involving segment 4B and 5 with progressive heterogeneous enhancement, new since 07/22/2016 CT abdomen study. Liver malignancy is suspected. Differential includes intrahepatic cholangiocarcinoma or solitary liver metastasis from an occult primary. Tissue sampling advised. 2.  Aortic Atherosclerosis (ICD10-I70.0). Electronically Signed   By: JIlona SorrelM.D.   On: 09/13/2017 17:30   [place]  Result Date:  09/22/2017 CLINICAL DATA:  5 cm hepatic mass of the anterior liver. EXAM: ULTRASOUND GUIDED CORE BIOPSY OF LIVER MEDICATIONS: 2.0 mg IV Versed; 100 mcg IV Fentanyl Total Moderate Sedation Time: 16 minutes. The patient's level of consciousness and physiologic status were continuously monitored during the procedure by Radiology nursing. PROCEDURE: The procedure, risks, benefits, and alternatives were explained to the patient. Questions regarding the procedure were encouraged and answered. The patient understands and consents to the procedure. A time  out was performed prior to initiating the procedure. Ultrasound was performed of the liver. The anterior abdominal wall was prepped with chlorhexidine in a sterile fashion, and a sterile drape was applied covering the operative field. A sterile gown and sterile gloves were used for the procedure. Local anesthesia was provided with 1% Lidocaine. Under ultrasound guidance, a 17 gauge needle was advanced into the liver. Two separate 18 gauge core biopsy samples were obtained of a hepatic mass. Gel-Foam pledgets were advanced through the outer needle as it was retracted. Additional ultrasound was performed. COMPLICATIONS: None. FINDINGS: Anterior hepatic mass straddling between the juncture of right and left lobes measures approximately 5 cm in greatest diameter by ultrasound. Solid tissue was obtained. IMPRESSION: Ultrasound-guided core biopsy performed a 5 cm anterior hepatic mass between the junction of right and left lobes. Electronically Signed   By: Aletta Edouard M.D.   On: 09/22/2017 15:33       IMPRESSION/PLAN: 1. Cholangiocarcinoma. Dr. Lisbeth Renshaw discusses the pathology findings and reviews the nature of cholangiocarcinoma and reviewed the findings of her MRI and CT based imaging.  The discussion yesterday in GI conference was to consider localized therapy with either Y 90 or ablative therapy with stereotactic body radiation (SBRT). We discussed the risks,  benefits, short, and long term effects of radiotherapy, and the patient is interested in proceeding. Dr. Lisbeth Renshaw discusses the delivery and logistics of radiotherapy and anticipates a course of 5-8 fractions of radiotherapy. Written consent is obtained and placed in the chart, a copy was provided to the patient.   The above documentation reflects my direct findings during this shared patient visit. Please see the separate note by Dr. Lisbeth Renshaw on this date for the remainder of the patient's plan of care.    Carola Rhine, PAC

## 2017-10-13 NOTE — Addendum Note (Signed)
Encounter addended by: Kyung Rudd, MD on: 10/13/2017 6:06 PM  Actions taken: Problem List modified, Visit diagnoses modified

## 2017-10-18 ENCOUNTER — Other Ambulatory Visit: Payer: Self-pay | Admitting: *Deleted

## 2017-10-18 ENCOUNTER — Telehealth: Payer: Self-pay | Admitting: *Deleted

## 2017-10-18 DIAGNOSIS — C229 Malignant neoplasm of liver, not specified as primary or secondary: Secondary | ICD-10-CM

## 2017-10-18 NOTE — Telephone Encounter (Signed)
CALLED PATIENT TO INFORM OF LAB FOR 10-19-17 @ 9:30 AM, SPOKE WITH PATIENT'S HUSBAND WILLIAM AND HE IS AWARE OF THIS APPT.

## 2017-10-19 ENCOUNTER — Ambulatory Visit
Admission: RE | Admit: 2017-10-19 | Discharge: 2017-10-19 | Disposition: A | Discharge status: Home / Self Care | Payer: Medicare Other | Source: Ambulatory Visit | Attending: Radiation Oncology | Admitting: Radiation Oncology

## 2017-10-19 ENCOUNTER — Other Ambulatory Visit: Payer: Self-pay | Admitting: *Deleted

## 2017-10-19 VITALS — BP 142/62 | HR 61 | Temp 98.2°F | Resp 20 | Ht 62.0 in | Wt 143.2 lb

## 2017-10-19 DIAGNOSIS — Z510 Encounter for antineoplastic radiation therapy: Secondary | ICD-10-CM | POA: Insufficient documentation

## 2017-10-19 DIAGNOSIS — R160 Hepatomegaly, not elsewhere classified: Secondary | ICD-10-CM

## 2017-10-19 DIAGNOSIS — C229 Malignant neoplasm of liver, not specified as primary or secondary: Secondary | ICD-10-CM

## 2017-10-19 DIAGNOSIS — C801 Malignant (primary) neoplasm, unspecified: Secondary | ICD-10-CM | POA: Diagnosis not present

## 2017-10-19 DIAGNOSIS — C221 Intrahepatic bile duct carcinoma: Secondary | ICD-10-CM

## 2017-10-19 LAB — BUN & CREATININE (CHCC)
BUN: 27 mg/dL — ABNORMAL HIGH (ref 8–23)
Creatinine: 1.22 mg/dL — ABNORMAL HIGH (ref 0.44–1.00)
GFR, EST AFRICAN AMERICAN: 44 mL/min — AB (ref >60)
GFR, EST NON AFRICAN AMERICAN: 38 mL/min — AB (ref >60)

## 2017-10-19 MED ORDER — SODIUM CHLORIDE 0.9% FLUSH
10.0000 mL | Freq: Once | INTRAVENOUS | Status: AC
  Administered 2017-10-19: 10 mL via INTRAVENOUS

## 2017-10-19 NOTE — Progress Notes (Signed)
Has armband been applied?  Yes  Does patient have an allergy to IV contrast dye?: No   Has patient ever received premedication for IV contrast dye?:  No  Does patient take metformin?: No  If patient does take metformin when was the last dose: N/A  Date of lab work: 10/19/2017 BUN: 27 CR: 1.22 EGfr: 38  IV site: Right AC  Has IV site been added to flowsheet?  Yes  BP (!) 142/62 (Patient Position: Sitting)   Pulse 61   Temp 98.2 F (36.8 C)   Resp 20   Ht _0  (1.575 m)   Wt 143 lb 3.2 oz (65 kg)   LMP  (LMP Unknown)   SpO2 93%   BMI 26.19 kg/m

## 2017-10-20 NOTE — Addendum Note (Signed)
Encounter addended by: Kyung Rudd, MD on: 10/20/2017 2:56 PM  Actions taken: Sign clinical note

## 2017-10-27 DIAGNOSIS — C801 Malignant (primary) neoplasm, unspecified: Secondary | ICD-10-CM | POA: Diagnosis not present

## 2017-10-31 ENCOUNTER — Ambulatory Visit
Admission: RE | Admit: 2017-10-31 | Discharge: 2017-10-31 | Disposition: A | Discharge status: Home / Self Care | Payer: Medicare Other | Source: Ambulatory Visit | Attending: Radiation Oncology | Admitting: Radiation Oncology

## 2017-10-31 DIAGNOSIS — C801 Malignant (primary) neoplasm, unspecified: Secondary | ICD-10-CM | POA: Diagnosis not present

## 2017-11-01 ENCOUNTER — Ambulatory Visit: Payer: Medicare Other | Admitting: Radiation Oncology

## 2017-11-01 ENCOUNTER — Telehealth: Payer: Self-pay | Admitting: Oncology

## 2017-11-01 ENCOUNTER — Inpatient Hospital Stay: Payer: Medicare Other | Attending: Oncology | Admitting: Oncology

## 2017-11-01 VITALS — BP 152/64 | HR 62 | Temp 98.2°F | Resp 19 | Ht 62.0 in | Wt 142.6 lb

## 2017-11-01 DIAGNOSIS — R0609 Other forms of dyspnea: Secondary | ICD-10-CM | POA: Insufficient documentation

## 2017-11-01 DIAGNOSIS — G8918 Other acute postprocedural pain: Secondary | ICD-10-CM | POA: Diagnosis not present

## 2017-11-01 DIAGNOSIS — C44729 Squamous cell carcinoma of skin of left lower limb, including hip: Secondary | ICD-10-CM | POA: Insufficient documentation

## 2017-11-01 DIAGNOSIS — I110 Hypertensive heart disease with heart failure: Secondary | ICD-10-CM | POA: Insufficient documentation

## 2017-11-01 DIAGNOSIS — I5032 Chronic diastolic (congestive) heart failure: Secondary | ICD-10-CM | POA: Diagnosis not present

## 2017-11-01 DIAGNOSIS — C801 Malignant (primary) neoplasm, unspecified: Secondary | ICD-10-CM

## 2017-11-01 DIAGNOSIS — D649 Anemia, unspecified: Secondary | ICD-10-CM | POA: Diagnosis not present

## 2017-11-01 DIAGNOSIS — C227 Other specified carcinomas of liver: Secondary | ICD-10-CM | POA: Diagnosis present

## 2017-11-01 NOTE — Progress Notes (Signed)
  [place]  OFFICE PROGRESS NOTE   Diagnosis: Adenocarcinoma  INTERVAL HISTORY:   Summer Nielsen returns for a scheduled visit.  She reports stable exertional dyspnea.  No abdominal pain or nausea.  She began SBRT relation to the liver mass on 10/31/2017.  She is scheduled for 5 treatments.  She has burning discomfort at the left lower leg since undergoing removal of a squamous cell carcinoma.  Objective:  Vital signs in last 24 hours:  Blood pressure (!) 152/64, pulse 62, temperature 98.2 F (36.8 C), temperature source Oral, resp. rate 19, height _0  (1.575 m), weight 142 lb 9.6 oz (64.7 kg), SpO2 97 %.     Resp: Inspiratory rales at the lower posterior chest bilaterally, no respiratory distress Cardio: Regular rate and rhythm GI: No hepatosplenomegaly, no mass, nontender Vascular: No leg edema  Skin: Healing biopsy site at the left lower leg    Lab Results:  Lab Results  Component Value Date   WBC 8.7 09/22/2017   HGB 12.1 09/22/2017   HCT 37.2 09/22/2017   MCV 97.1 09/22/2017   PLT 361 09/22/2017   NEUTROABS 4.7 08/31/2017    CMP  Lab Results  Component Value Date   NA 144 08/16/2017   K 4.4 08/16/2017   CL 101 08/16/2017   CO2 26 08/16/2017   GLUCOSE 94 08/16/2017   BUN 27 (H) 10/19/2017   CREATININE 1.22 (H) 10/19/2017   CALCIUM 9.3 08/16/2017   PROT 7.0 08/08/2017   ALBUMIN 4.4 08/08/2017   AST 16 08/08/2017   ALT 15 08/08/2017   ALKPHOS 45 08/08/2017   BILITOT 0.4 08/08/2017   GFRNONAA 38 (L) 10/19/2017   GFRAA 44 (L) 10/19/2017    Medications: I have reviewed the patient's current medications.   Assessment/Plan: 1. Anterior liver mass, ultrasound-guided biopsy 09/22/2017-adenocarcinoma ? CT chest 09/07/2017, 4.1 cm hypodense anterior liver mass, changes of chronic hypersensitivity pneumonitis ? MRI abdomen 09/13/2017-isolated liver mass measuring 4.7 x 3.8 cm involving segments 4B and 5, no additional liver masses ? SBRT to the liver  mass beginning 10/31/2017 2. Pain secondary to #1 3. History of diastolic heart failure 4. Hypertension 5. G3, P3 6. History of anemia    Disposition: Summer Nielsen appears stable.  She will complete the course of radiation over the next week.  She will return for an office visit here in approximately 6 weeks.  We will plan for a restaging CT or MRI of the abdomen 3-4 months after the completion of radiation.  15 minutes were spent with the patient today.  The majority of the time was used for counseling and coordination of care.  Betsy Coder, MD  11/01/2017  10:57 AM

## 2017-11-01 NOTE — Telephone Encounter (Signed)
Appts scheduled AVS/Calendar printed per 8/27 los °

## 2017-11-02 ENCOUNTER — Ambulatory Visit
Admission: RE | Admit: 2017-11-02 | Discharge: 2017-11-02 | Disposition: A | Discharge status: Home / Self Care | Payer: Medicare Other | Source: Ambulatory Visit | Attending: Radiation Oncology | Admitting: Radiation Oncology

## 2017-11-02 DIAGNOSIS — C801 Malignant (primary) neoplasm, unspecified: Secondary | ICD-10-CM | POA: Diagnosis not present

## 2017-11-03 ENCOUNTER — Ambulatory Visit: Payer: Medicare Other | Admitting: Radiation Oncology

## 2017-11-04 ENCOUNTER — Other Ambulatory Visit (INDEPENDENT_AMBULATORY_CARE_PROVIDER_SITE_OTHER): Payer: Self-pay | Admitting: Specialist

## 2017-11-04 ENCOUNTER — Ambulatory Visit
Admission: RE | Admit: 2017-11-04 | Discharge: 2017-11-04 | Disposition: A | Discharge status: Home / Self Care | Payer: Medicare Other | Source: Ambulatory Visit | Attending: Radiation Oncology | Admitting: Radiation Oncology

## 2017-11-04 DIAGNOSIS — C801 Malignant (primary) neoplasm, unspecified: Secondary | ICD-10-CM | POA: Diagnosis not present

## 2017-11-04 MED ORDER — TRAMADOL-ACETAMINOPHEN 37.5-325 MG PO TABS: 1.0000 | ORAL_TABLET | Freq: Four times a day (QID) | ORAL | 0 refills | Status: DC | PRN

## 2017-11-04 NOTE — Telephone Encounter (Signed)
Patient called requesting an RX refill on her Tramadol.  She uses the [facility] on the [address] of [address]

## 2017-11-04 NOTE — Telephone Encounter (Signed)
OK thanks

## 2017-11-04 NOTE — Telephone Encounter (Signed)
Called to pharmacy. I called patient and advised.

## 2017-11-08 ENCOUNTER — Ambulatory Visit
Admission: RE | Admit: 2017-11-08 | Discharge: 2017-11-08 | Disposition: A | Discharge status: Home / Self Care | Payer: Medicare Other | Source: Ambulatory Visit | Attending: Radiation Oncology | Admitting: Radiation Oncology

## 2017-11-08 DIAGNOSIS — C801 Malignant (primary) neoplasm, unspecified: Secondary | ICD-10-CM | POA: Diagnosis present

## 2017-11-08 DIAGNOSIS — Z510 Encounter for antineoplastic radiation therapy: Secondary | ICD-10-CM | POA: Insufficient documentation

## 2017-11-10 ENCOUNTER — Ambulatory Visit
Admission: RE | Admit: 2017-11-10 | Discharge: 2017-11-10 | Disposition: A | Discharge status: Home / Self Care | Payer: Medicare Other | Source: Ambulatory Visit | Attending: Radiation Oncology | Admitting: Radiation Oncology

## 2017-11-10 ENCOUNTER — Encounter: Payer: Self-pay | Admitting: Radiation Oncology

## 2017-11-10 DIAGNOSIS — C801 Malignant (primary) neoplasm, unspecified: Secondary | ICD-10-CM | POA: Diagnosis not present

## 2017-11-14 ENCOUNTER — Ambulatory Visit: Payer: Medicare Other | Admitting: Radiation Oncology

## 2017-11-22 NOTE — Progress Notes (Incomplete)
Radiation Oncology         [phone]) [phone] ________________________________  Name: Summer Nielsen MRN: [id number]  Date: 11/10/2017  DOB: 04/14/1927  End of Treatment Note  Diagnosis:   84 y.o. female with Stage I Cholangiocarcinoma  Indication for treatment:  Curative       Radiation treatment dates:   10/31/2017 - 11/10/2017  Site/dose:   The tumor in the liver was treated with a course of stereotactic body radiation treatment. The patient received 60 Gy in 5 fractions at 12 Gy per fraction.  Beams/energy:   SBRT/SRT-3D // 6X-FFF Photon  Narrative: The patient tolerated radiation treatment relatively well.   The patient did not have any signs of acute toxicity during treatment.  Plan: The patient has completed radiation treatment. The patient will return to radiation oncology clinic for routine followup in one month. I advised the patient to call or return sooner if they have any questions or concerns related to their recovery or treatment.   ------------------------------------------------  Jodelle Gross, MD, PhD

## 2017-12-10 ENCOUNTER — Other Ambulatory Visit (INDEPENDENT_AMBULATORY_CARE_PROVIDER_SITE_OTHER): Payer: Self-pay | Admitting: Physical Medicine and Rehabilitation

## 2017-12-12 ENCOUNTER — Ambulatory Visit: Payer: Medicare Other | Admitting: Radiation Oncology

## 2017-12-12 NOTE — Telephone Encounter (Signed)
Please advise.

## 2017-12-20 ENCOUNTER — Inpatient Hospital Stay: Payer: Medicare Other | Attending: Oncology | Admitting: Oncology

## 2017-12-20 ENCOUNTER — Telehealth: Payer: Self-pay

## 2017-12-20 VITALS — BP 135/67 | HR 62 | Temp 98.5°F | Resp 18 | Ht 62.0 in | Wt 142.1 lb

## 2017-12-20 DIAGNOSIS — I5032 Chronic diastolic (congestive) heart failure: Secondary | ICD-10-CM

## 2017-12-20 DIAGNOSIS — R5381 Other malaise: Secondary | ICD-10-CM | POA: Diagnosis not present

## 2017-12-20 DIAGNOSIS — I110 Hypertensive heart disease with heart failure: Secondary | ICD-10-CM | POA: Diagnosis not present

## 2017-12-20 DIAGNOSIS — C227 Other specified carcinomas of liver: Secondary | ICD-10-CM

## 2017-12-20 DIAGNOSIS — C801 Malignant (primary) neoplasm, unspecified: Secondary | ICD-10-CM

## 2017-12-20 NOTE — Progress Notes (Signed)
  [place] OFFICE PROGRESS NOTE   Diagnosis: Unknown primary carcinoma  INTERVAL HISTORY:   Ms. Schrom returns for a scheduled visit.  She completed SBRT on 11/10/2017.  She reports malaise following the SBRT therapy.  This has improved.  No complaint today.  Objective:  Vital signs in last 24 hours:  Blood pressure 135/67, pulse 62, temperature 98.5 F (36.9 C), temperature source Oral, resp. rate 18, height _0  (1.575 m), weight 142 lb 1.6 oz (64.5 kg), SpO2 96 %.    HEENT: Neck without mass Lymphatics: No cervical, supraclavicular, or axillary nodes Resp: Inspiratory rales throughout the lower posterior chest, no respiratory distress Cardio: Regular rate and rhythm GI: No hepatomegaly, no mass, nontender Vascular: No leg edema   Lab Results:  Lab Results  Component Value Date   WBC 8.7 09/22/2017   HGB 12.1 09/22/2017   HCT 37.2 09/22/2017   MCV 97.1 09/22/2017   PLT 361 09/22/2017   NEUTROABS 4.7 08/31/2017    CMP  Lab Results  Component Value Date   NA 144 08/16/2017   K 4.4 08/16/2017   CL 101 08/16/2017   CO2 26 08/16/2017   GLUCOSE 94 08/16/2017   BUN 27 (H) 10/19/2017   CREATININE 1.22 (H) 10/19/2017   CALCIUM 9.3 08/16/2017   PROT 7.0 08/08/2017   ALBUMIN 4.4 08/08/2017   AST 16 08/08/2017   ALT 15 08/08/2017   ALKPHOS 45 08/08/2017   BILITOT 0.4 08/08/2017   GFRNONAA 38 (L) 10/19/2017   GFRAA 44 (L) 10/19/2017    Lab Results  Component Value Date   CEA1 2.04 10/05/2017     Medications: I have reviewed the patient's current medications.   Assessment/Plan:  1. Anterior liver mass, ultrasound-guided biopsy 09/22/2017-adenocarcinoma ? CT chest 09/07/2017, 4.1 cm hypodense anterior liver mass, changes of chronic hypersensitivity pneumonitis ? MRI abdomen 09/13/2017-isolated liver mass measuring 4.7 x 3.8 cm involving segments 4B and 5, no additional liver masses ? SBRT to the liver mass beginning 10/31/2017, completed  11/10/2017 2. Pain secondary to #1 3. History of diastolic heart failure 4. Hypertension 5. G3, P3 6. History of anemia  Disposition: Ms. Redler appears well.  She completed SBRT to the liver mass on 11/10/2017.  She will be scheduled for a restaging abdomen MRI prior to an office visit in 2 months.  15 minutes were spent with the patient today.  The majority of the time was used for counseling and coordination of care.  Betsy Coder, MD  12/20/2017  12:25 PM

## 2017-12-20 NOTE — Telephone Encounter (Signed)
Printed avs and calender of upcoming appointment. Per 10/15 los

## 2017-12-21 ENCOUNTER — Encounter (INDEPENDENT_AMBULATORY_CARE_PROVIDER_SITE_OTHER): Payer: Self-pay | Admitting: Specialist

## 2017-12-21 ENCOUNTER — Ambulatory Visit (INDEPENDENT_AMBULATORY_CARE_PROVIDER_SITE_OTHER): Payer: Medicare Other | Admitting: Specialist

## 2017-12-21 VITALS — BP 128/56 | HR 53 | Ht 62.0 in | Wt 142.1 lb

## 2017-12-21 DIAGNOSIS — Z981 Arthrodesis status: Secondary | ICD-10-CM

## 2017-12-21 DIAGNOSIS — S22000S Wedge compression fracture of unspecified thoracic vertebra, sequela: Secondary | ICD-10-CM

## 2017-12-21 NOTE — Progress Notes (Signed)
Office Visit Note   Patient: Summer Nielsen           Date of Birth: [birth year]           MRN: [id number] Visit Date: 12/21/2017              Requested by: Maurice Small, MD [address], [place] [address] PCP: Maurice Small, MD   Assessment & Plan: Visit Diagnoses:  1. Thoracic compression fracture, sequela   2. S/P lumbar fusion     Plan:Avoid frequent bending and stooping  No lifting greater than 5-10 lbs. May use ice or moist heat for pain. Weight loss is of benefit. Best medication for lumbar disc disease is arthritis medications like motrin, celebrex and naprosyn. Exercise is important to improve your indurance and does allow people to function better inspite of back pain. CBD oil or capsule [email or web address]   6040m  Per day.   Follow-Up Instructions: Return in about 6 months (around 06/22/2018).   Orders:  No orders of the defined types were placed in this encounter.  No orders of the defined types were placed in this encounter.     Procedures: No procedures performed   Clinical Data: No additional findings.   Subjective: Chief Complaint  Patient presents with  . Lower Back - Follow-up    84year old female with persistant transverse thoracolumbar  With radiation into the lower ribs bilateral. No leg pain pain is worse with standing and getting up from lying down or sitting to standing. No bowel or bladder difficulty, she takes  Lasix. Seeing Dr. SBenay Spicethe oncologist and she underwent radiaiton therapy. Dr. SBenay Spicefeels she is doing good. 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   Review of Systems  Constitutional: Negative.  Negative for activity change and unexpected weight change.  HENT: Negative.   Eyes: Negative.   Respiratory: Negative.   Cardiovascular: Negative.   Gastrointestinal: Negative.   Endocrine: Negative.   Genitourinary: Negative.   Musculoskeletal: Negative.   Skin:  Negative.   Allergic/Immunologic: Negative.   Neurological: Negative.   Hematological: Negative.   Psychiatric/Behavioral: Negative.      Objective: Vital Signs: BP (!) 128/56   Pulse (!) 53   Ht _0  (1.575 m)   Wt 142 lb 1.6 oz (64.5 kg)   LMP  (LMP Unknown)   BMI 25.99 kg/m   Physical Exam  Back Exam   Tenderness  The patient is experiencing tenderness in the lumbar.  Range of Motion  Extension: normal  Flexion: normal  Lateral bend right: normal  Lateral bend left: normal  Rotation right: normal  Rotation left: normal   Muscle Strength  Right Quadriceps:  5/5  Left Quadriceps:  5/5  Right Hamstrings:  5/5  Left Hamstrings:  5/5   Tests  Straight leg raise right: negative Straight leg raise left: negative  Other  Toe walk: normal Heel walk: normal Sensation: normal Gait: normal  Erythema: no back redness Scars: absent  Comments:  Gibbus at the lower thoracic spinous processes. Spinous processes of the lumbar spine deficient post laminectomectomies      Specialty Comments:  No specialty comments available.  Imaging: No results found.   PMFS History: Patient Active Problem List   Diagnosis Date Noted  . Anemia due to acute blood loss 03/17/2017    Priority: High    Class: Acute  . Other secondary scoliosis, lumbar region 03/16/2017  Priority: High    Class: Chronic  . Spinal stenosis of lumbar region 03/15/2017    Priority: High    Class: Chronic  . Cholangiocarcinoma ([place]) 10/13/2017  . Liver mass 09/07/2017  . DOE (dyspnea on exertion) 08/30/2017  . Status post lumbar spinal fusion 03/15/2017  . Closed displaced fracture of base of fifth metacarpal bone of left hand 10/12/2016  . Lumbar radiculopathy 08/26/2016  . Iron deficiency anemia 05/20/2016  . Chronic right shoulder pain 02/16/2016  . Acute diastolic heart failure ([place]) 10/30/2015  . Secondary pulmonary hypertension 10/30/2015  . Hypertensive heart disease with heart  failure ([facility]) 10/30/2015  . TIA (transient ischemic attack)   . 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 Postsurgical hypothyroidism   . OP (osteoporosis)   . Hyperlipidemia   . High blood pressure   . Glaucoma   . CHF (congestive heart failure) ([facility])    Past Medical History:  Diagnosis Date  . Cancer ([place])    squamous cell skin cancer on left leg-removed  . CHF (congestive heart failure) ([facility])   . Complication of anesthesia   . GERD (gastroesophageal reflux disease)   . Glaucoma   . High blood pressure   . Hyperlipidemia   . Hypothyroidism   . OP (osteoporosis)   . PONV (postoperative nausea and vomiting)   . Postsurgical hypothyroidism   . 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 Tachycardia   . TIA (transient ischemic attack)     Family History  Problem Relation Age of Onset  . Heart attack Mother   . Cerebral aneurysm Mother   . Stroke Mother   . Hypertension Mother   . Heart attack Father   . Heart disease Sister   . Heart disease Brother     Past Surgical History:  Procedure Laterality Date  . ABDOMINAL HYSTERECTOMY    . BACK SURGERY N/A 03/2017   Lumbar  . CHOLECYSTECTOMY    . EYE SURGERY     removed cateracts on both eyes  . JOINT REPLACEMENT     3 hip replacement on left, right knee replacement  . THYROIDECTOMY     Social History   Occupational History  . Not on file  Tobacco Use  . Smoking status: Never Smoker  . Smokeless tobacco: Never Used  Substance and Sexual Activity  . Alcohol use: No  . Drug use: No  . Sexual activity: Not on file

## 2017-12-21 NOTE — Patient Instructions (Signed)
Avoid frequent bending and stooping  No lifting greater than 5-10 lbs. May use ice or moist heat for pain. Weight loss is of benefit. Best medication for lumbar disc disease is arthritis medications like motrin, celebrex and naprosyn. Exercise is important to improve your indurance and does allow people to function better inspite of back pain. CBD oil or capsule [email or web address]   6026m  Per day.

## 2017-12-28 ENCOUNTER — Ambulatory Visit (INDEPENDENT_AMBULATORY_CARE_PROVIDER_SITE_OTHER): Payer: Medicare Other

## 2017-12-28 ENCOUNTER — Ambulatory Visit: Payer: Medicare Other | Admitting: Podiatry

## 2017-12-28 DIAGNOSIS — M2041 Other hammer toe(s) (acquired), right foot: Secondary | ICD-10-CM

## 2017-12-28 DIAGNOSIS — M779 Enthesopathy, unspecified: Secondary | ICD-10-CM

## 2017-12-28 DIAGNOSIS — M7751 Other enthesopathy of right foot: Secondary | ICD-10-CM | POA: Diagnosis not present

## 2017-12-28 DIAGNOSIS — M778 Other enthesopathies, not elsewhere classified: Secondary | ICD-10-CM

## 2018-01-01 NOTE — Progress Notes (Signed)
   HPI: 84 year old female presenting today as a new patient with a chief complaint of constant pain to the 1st and 2nd toes of the right foot that began 2 years ago. She states walking and standing increases the pain. She has been taking OTC Tylenol for treatment. Patient is here for further evaluation and treatment.   Past Medical History:  Diagnosis Date  . Cancer ([facility])    squamous cell skin cancer on left leg-removed  . CHF (congestive heart failure) ([facility])   . Complication of anesthesia   . GERD (gastroesophageal reflux disease)   . Glaucoma   . High blood pressure   . Hyperlipidemia   . Hypothyroidism   . OP (osteoporosis)   . PONV (postoperative nausea and vomiting)   . Postsurgical hypothyroidism   . 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 Tachycardia   . TIA (transient ischemic attack)       Objective: Physical Exam General: The patient is alert and oriented x3 in no acute distress.  Dermatology: Skin is cool, dry and supple bilateral lower extremities. Negative for open lesions or macerations.  Vascular: Palpable pedal pulses bilaterally. No edema or erythema noted. Capillary refill within normal limits.  Neurological: Epicritic and protective threshold grossly intact bilaterally.   Musculoskeletal Exam: All pedal and ankle joints range of motion within normal limits bilateral. Muscle strength 5/5 in all groups bilateral. Hammertoe contracture deformity noted to digits 2-5 of the right foot. Pain with palpation to the 2nd MPJ of the right foot.   Radiographic Exam: Hammertoe contracture deformity noted to the interphalangeal joints and MPJ of the respective hammertoe digits mentioned on clinical musculoskeletal exam.     Assessment: 1. Hammertoes digits 2-5 right 2. 2nd MPJ capsulitis right    Plan of Care:  1. Patient evaluated. X-Rays reviewed.  2. Injection of 0.5 mLs Celestone Soluspan injected into the 2nd MPJ of the right  foot.  3. Continue taking OTC Tylenol as needed.  4. We will avoid surgery at the moment and pursue conservative treatment.  5. Return to clinic in 4 weeks.     Edrick Kins, DPM [facility]  Dr. Edrick Kins, DPM    [address], [place] [address]                Office [phone]  Fax [phone]

## 2018-01-08 ENCOUNTER — Other Ambulatory Visit: Payer: Medicare Other

## 2018-01-16 ENCOUNTER — Telehealth: Payer: Self-pay | Admitting: *Deleted

## 2018-01-16 NOTE — Telephone Encounter (Signed)
Patient called to report increased abdominal discomfort over the past 1-2 weeks. She is having abdominal swelling. Feels bloated w/belching. Persistent nausea w/no emesis. No constipation or diarrhea. Urine is clear yellow and she is having no swelling legs or feet. Feels she should be seen.

## 2018-01-17 NOTE — Telephone Encounter (Addendum)
Informed patient that Dr. Benay Spice is willing to see her tomorrow at 0930. She is not able to come due to her husband needing to see his physician at 0945 that day and he can't miss the appointment. Needs another day or the afternoon of 11/13. Offered 11/15 at 11:15 to see NP. She is not able to come due to husband having echo and stress test.

## 2018-01-17 NOTE — Telephone Encounter (Signed)
Patient called back to report she can come in tomorrow at 0930 as earlier offered. Her son is taking husband to MD.

## 2018-01-18 ENCOUNTER — Inpatient Hospital Stay: Payer: Medicare Other | Attending: Oncology | Admitting: Oncology

## 2018-01-18 ENCOUNTER — Telehealth: Payer: Self-pay | Admitting: Oncology

## 2018-01-18 VITALS — BP 147/63 | HR 65 | Temp 98.4°F | Resp 14 | Ht 62.0 in | Wt 144.6 lb

## 2018-01-18 DIAGNOSIS — I10 Essential (primary) hypertension: Secondary | ICD-10-CM | POA: Insufficient documentation

## 2018-01-18 DIAGNOSIS — C227 Other specified carcinomas of liver: Secondary | ICD-10-CM

## 2018-01-18 DIAGNOSIS — I5032 Chronic diastolic (congestive) heart failure: Secondary | ICD-10-CM | POA: Insufficient documentation

## 2018-01-18 DIAGNOSIS — C801 Malignant (primary) neoplasm, unspecified: Secondary | ICD-10-CM

## 2018-01-18 NOTE — Progress Notes (Signed)
[facility] Simulation and Treatment Planning Note   Name:  Summer Nielsen MRN: [id number]   Date: 01/18/2018  DOB: 14-Nov-1927  Status:outpatient    DIAGNOSIS:    ICD-10-CM   1. Liver mass R16.0 CANCELED: BUN & Creatinine ([facility])  2. Cholangiocarcinoma ([facility]) C22.1      TREATMENT SITE:  liver   CONSENT VERIFIED:yes   SET UP: Patient is setup supine   IMMOBILIZATION: The patient was immobilized using a customized Vac Loc bag/ blue bag and customized accuform device   NARRATIVE:The patient was brought to the [facility].  Identity was confirmed.  All relevant records and images related to the planned course of therapy were reviewed.  Then, the patient was positioned in a stable reproducible clinical set-up for radiation therapy. Abdominal compression was applied by me.  4D CT images were obtained and reproducible breathing pattern was confirmed. Free breathing CT images were obtained.  Skin markings were placed.  The CT images were loaded into the planning software where the target and avoidance structures were contoured.  The radiation prescription was entered and confirmed.    TREATMENT PLANNING NOTE:  Treatment planning then occurred. I have requested : IMRT planning.This treatment technique is medically necessary due to the high-dose of radiation delivered to the target region which is in close proximity to adjacent critical normal structures.  3 dimensional simulation is performed and dose volume histogram of the gross tumor volume, planning tumor volume and criticial normal structures including the spinal cord and lungs were analyzed and requested.  Special treatment procedure was performed due to high dose per fraction.  The patient will be monitored for increased risk of toxicity.  Daily imaging using cone beam CT will be used for target localization.  I anticipate that the patient will receive 60 Gy in 5 fractions to  target volume. Further adjustments will be made based on the planning process is necessary.  ------------------------------------------------  Jodelle Gross, MD, PhD

## 2018-01-18 NOTE — Telephone Encounter (Signed)
Scheduled appt per 11/13 los - gave patient AVS and calender per los.

## 2018-01-18 NOTE — Patient Instructions (Signed)
Please provide copy of your Advanced Directive/Living will at next visit to be scanned into chart

## 2018-01-18 NOTE — Progress Notes (Signed)
  [facility] OFFICE PROGRESS NOTE   Diagnosis: Unknown primary carcinoma versus cholangiocarcinoma  INTERVAL HISTORY:   Summer Nielsen returns prior to his scheduled visit.  She complains of mid abdominal discomfort for the past month.  She is not taking pain medication.  She also feels bloated and has frequent belching.  No dysphasia or emesis.  She is having bowel movements.  She has nausea.  The nausea is helped with Zofran.  Objective:  Vital signs in last 24 hours:  Blood pressure (!) 147/63, pulse 65, temperature 98.4 F (36.9 C), temperature source Oral, resp. rate 14, height _0  (1.575 m), weight 144 lb 9.6 oz (65.6 kg), SpO2 96 %.   Resp: Diffuse inspiratory rales but no respiratory distress Cardio: Regular rate and rhythm GI: Mildly distended, bowel sounds are present, soft, no mass, nontender, no hepatomegaly Vascular: Leg edema   Lab Results:  Lab Results  Component Value Date   WBC 8.7 09/22/2017   HGB 12.1 09/22/2017   HCT 37.2 09/22/2017   MCV 97.1 09/22/2017   PLT 361 09/22/2017   NEUTROABS 4.7 08/31/2017    CMP  Lab Results  Component Value Date   NA 144 08/16/2017   K 4.4 08/16/2017   CL 101 08/16/2017   CO2 26 08/16/2017   GLUCOSE 94 08/16/2017   BUN 27 (H) 10/19/2017   CREATININE 1.22 (H) 10/19/2017   CALCIUM 9.3 08/16/2017   PROT 7.0 08/08/2017   ALBUMIN 4.4 08/08/2017   AST 16 08/08/2017   ALT 15 08/08/2017   ALKPHOS 45 08/08/2017   BILITOT 0.4 08/08/2017   GFRNONAA 38 (L) 10/19/2017   GFRAA 44 (L) 10/19/2017    Lab Results  Component Value Date   CEA1 2.04 10/05/2017     Medications: I have reviewed the patient's current medications.   Assessment/Plan: 1. Anterior liver mass, ultrasound-guided biopsy 09/22/2017-adenocarcinoma ? CT chest 09/07/2017, 4.1 cm hypodense anterior liver mass, changes of chronic hypersensitivity pneumonitis ? MRI abdomen 09/13/2017-isolated liver mass measuring 4.7 x 3.8 cm involving segments  4B and 5, no additional liver masses ? SBRT to the liver mass beginning 10/31/2017, completed 11/10/2017 2. Pain secondary to #1 3. History of diastolic heart failure 4. Hypertension 5. G3, P3 6. History of anemia   Disposition: Summer Nielsen completed SBRT to a liver mass 2 months ago.  She now presents with nausea, abdominal pain, and belching.  The etiology of her symptoms is unclear.  She is scheduled to undergo a restaging MRI of the liver next month. She will be scheduled for a CT abdomen within the next few days to look for evidence of a bowel obstruction and other etiologies of her symptoms.  She will return for an office visit on 01/24/2018.  15 minutes were spent with the patient today.  The majority of the time was used for counseling and coordination of care.   Betsy Coder, MD  01/18/2018  9:53 AM

## 2018-01-19 ENCOUNTER — Telehealth: Payer: Self-pay | Admitting: *Deleted

## 2018-01-19 NOTE — Telephone Encounter (Signed)
Called to ask if her CT scan for 11/18 has been authorized and when does she drink contrast and how to obtain it. Informed her she can come here or go to [facility] radiology to pick up contrast. Scan is at 2:30, so drink bottle #1 at 1230 and #2 a 1:30 pm. NPO for  4 hours prior.

## 2018-01-23 ENCOUNTER — Ambulatory Visit
Admission: RE | Admit: 2018-01-23 | Discharge: 2018-01-23 | Disposition: A | Discharge status: Home / Self Care | Payer: Medicare Other | Source: Ambulatory Visit | Attending: Oncology | Admitting: Oncology

## 2018-01-23 DIAGNOSIS — C801 Malignant (primary) neoplasm, unspecified: Secondary | ICD-10-CM

## 2018-01-24 ENCOUNTER — Inpatient Hospital Stay: Payer: Medicare Other | Admitting: Oncology

## 2018-01-24 VITALS — BP 139/68 | HR 59 | Temp 98.3°F | Resp 18 | Ht 62.0 in | Wt 145.0 lb

## 2018-01-24 DIAGNOSIS — I5032 Chronic diastolic (congestive) heart failure: Secondary | ICD-10-CM

## 2018-01-24 DIAGNOSIS — I10 Essential (primary) hypertension: Secondary | ICD-10-CM | POA: Diagnosis not present

## 2018-01-24 DIAGNOSIS — C801 Malignant (primary) neoplasm, unspecified: Secondary | ICD-10-CM

## 2018-01-24 DIAGNOSIS — C227 Other specified carcinomas of liver: Secondary | ICD-10-CM

## 2018-01-24 NOTE — Progress Notes (Signed)
[facility] OFFICE PROGRESS NOTE   Diagnosis: Metastatic carcinoma  INTERVAL HISTORY:   Summer Nielsen returns as scheduled.  She continues to have lower abdominal pain.  The pain occurs after eating.  She has mild nausea.  No vomiting.  No new complaint.  She is not taking pain medication.  Objective:  Vital signs in last 24 hours:  Blood pressure 139/68, pulse (!) 59, temperature 98.3 F (36.8 C), temperature source Oral, resp. rate 18, height _0  (1.575 m), weight 145 lb (65.8 kg), SpO2 97 %.    Resp: Lungs clear bilaterally Cardio: Regular rate and rhythm GI: No hepatomegaly, no mass, tender in the right upper lateral abdomen, the abdomen is soft Vascular: No leg edema   Lab Results:  Lab Results  Component Value Date   WBC 8.7 09/22/2017   HGB 12.1 09/22/2017   HCT 37.2 09/22/2017   MCV 97.1 09/22/2017   PLT 361 09/22/2017   NEUTROABS 4.7 08/31/2017    CMP  Lab Results  Component Value Date   NA 144 08/16/2017   K 4.4 08/16/2017   CL 101 08/16/2017   CO2 26 08/16/2017   GLUCOSE 94 08/16/2017   BUN 27 (H) 10/19/2017   CREATININE 1.22 (H) 10/19/2017   CALCIUM 9.3 08/16/2017   PROT 7.0 08/08/2017   ALBUMIN 4.4 08/08/2017   AST 16 08/08/2017   ALT 15 08/08/2017   ALKPHOS 45 08/08/2017   BILITOT 0.4 08/08/2017   GFRNONAA 38 (L) 10/19/2017   GFRAA 44 (L) 10/19/2017    Lab Results  Component Value Date   CEA1 2.04 10/05/2017     Imaging:  Ct Abdomen Pelvis Wo Contrast  Result Date: 01/23/2018 CLINICAL DATA:  84 year old female with history of right-sided abdominal pain and nausea for the past 4-5 months. History of cholangiocarcinoma treated with radiation therapy. EXAM: CT ABDOMEN AND PELVIS WITHOUT CONTRAST TECHNIQUE: Multidetector CT imaging of the abdomen and pelvis was performed following the standard protocol without IV contrast. COMPARISON:  Abdominal MRI 09/13/2017. CT the abdomen and pelvis 07/22/2016. FINDINGS: Lower chest:  Mild cardiomegaly. Atherosclerotic calcifications in the left anterior descending and right coronary arteries. Calcifications of the aortic valve and mitral annulus. Patchy multifocal ground-glass attenuation septal thickening in the visualize lung bases. Numerous small calcified granulomas throughout the lung bases. Hepatobiliary: Previously noted hepatic mass is less well demonstrated on today's noncontrast CT examination, but estimated to measure approximately 5.9 x 4.4 x 5.0 cm (axial image 19 of series 2 and coronal image 38 of series 3). Adjacent to this extending posteriorly through the right lobe of the liver there is a linear area of low attenuation, which likely reflects an area of fatty metaplasia in the liver related to external beam radiation therapy. No other definite new hepatic lesions are noted. Status post cholecystectomy. Pancreas: No definite pancreatic mass or peripancreatic fluid or inflammatory changes are noted on today's noncontrast CT examination. Spleen: Unremarkable. Adrenals/Urinary Tract: Mild atrophy of the left kidney. Low-attenuation lesions in the right kidney, incompletely characterized on today's non-contrast CT examination, but previously characterized as simple cysts, measuring up to 2.6 cm in the interpolar region. No hydroureteronephrosis. Urinary bladder is unremarkable in appearance. Stomach/Bowel: Normal appearance of the stomach. No pathologic dilatation of small bowel or colon. Numerous colonic diverticulae are noted, particularly in the sigmoid colon, without surrounding inflammatory changes to suggest an acute diverticulitis at this time. The appendix is not confidently identified and may be surgically absent. Regardless, there are no inflammatory changes  noted adjacent to the cecum to suggest the presence of an acute appendicitis at this time. Vascular/Lymphatic: Aortic atherosclerosis. No lymphadenopathy noted in the abdomen or pelvis. Reproductive: Status post  hysterectomy. Ovaries are not confidently identified may be surgically absent or atrophic. Other: No significant volume of ascites.  No pneumoperitoneum. Musculoskeletal: Status post PLIF at L5-S1. Chronic compression fracture of T12 with 50% loss of anterior vertebral body height. Status post left hip arthroplasty and ORIF in the left hemipelvis. There are no aggressive appearing lytic or blastic lesions noted in the visualized portions of the skeleton. IMPRESSION: 1. Post treatment changes of external beam radiation therapy are noted in the liver. Previously noted lesion is poorly demonstrated on today's noncontrast CT examination, but estimated to measure approximately 5.9 x 4.4 x 5.0 cm. 2. No definite signs of metastatic disease in the abdomen or pelvis. 3. Colonic diverticulosis without evidence of acute diverticulitis at this time. 4. Aortic atherosclerosis, in addition to least 2 vessel coronary artery disease. 5. Mild cardiomegaly. 6. There are calcifications of the aortic valve and mitral annulus. Echocardiographic correlation for evaluation of potential valvular dysfunction may be warranted if clinically indicated. Electronically Signed   By: Vinnie Langton M.D.   On: 01/23/2018 16:11    Medications: I have reviewed the patient's current medications.   Assessment/Plan: 1. Anterior liver mass, ultrasound-guided biopsy 09/22/2017-adenocarcinoma ? CT chest 09/07/2017, 4.1 cm hypodense anterior liver mass, changes of chronic hypersensitivity pneumonitis ? MRI abdomen 09/13/2017-isolated liver mass measuring 4.7 x 3.8 cm involving segments 4B and 5, no additional liver masses ? SBRT to the liver mass beginning 10/31/2017, completed 11/10/2017 ? CT abdomen/pelvis 01/23/2018- radiation changes in the liver, liver mass measuring 5.9 x 4.4 x 5.0 cm but no evidence of metastatic disease in the abdomen or pelvis 2. Abdominal pain 3. History of diastolic heart failure 4. Hypertension 5. G3, P3 6. History of  anemia    Disposition: Summer Nielsen appears unchanged.  She continues to have postprandial abdominal pain.  The CT abdomen/pelvis reveals no explanation for the pain.  She has a persistent mass in the liver.  This may explain the right upper abdomen tenderness, but we cannot explain the lower abdominal discomfort.  I will refer her to Dr. Watt Climes to consider the indication for additional diagnostic evaluation such as an upper endoscopy.  She will return for an office visit as scheduled next month.  We are available to see her in the interim as needed.  I reviewed the CT images with Summer Nielsen.  Betsy Coder, MD  01/24/2018  1:55 PM

## 2018-01-25 ENCOUNTER — Ambulatory Visit: Payer: Medicare Other | Admitting: Podiatry

## 2018-01-25 ENCOUNTER — Encounter: Payer: Self-pay | Admitting: Podiatry

## 2018-01-25 DIAGNOSIS — M2041 Other hammer toe(s) (acquired), right foot: Secondary | ICD-10-CM

## 2018-01-25 DIAGNOSIS — M79676 Pain in unspecified toe(s): Secondary | ICD-10-CM | POA: Diagnosis not present

## 2018-01-25 DIAGNOSIS — B351 Tinea unguium: Secondary | ICD-10-CM | POA: Diagnosis not present

## 2018-01-25 DIAGNOSIS — M7751 Other enthesopathy of right foot: Secondary | ICD-10-CM | POA: Diagnosis not present

## 2018-01-29 NOTE — Progress Notes (Signed)
   HPI: 84 year old female presenting today for follow up evaluation of hammertoes of digits 2-5 of the right foot as well as capsulitis of the 2nd MPJ of the right foot. She states the pain is about the same and has not changed much since her last visit. She has been taking OTC Tylenol for treatment. There are no modifying factors noted.  She is also complaining of elongated, thickened toenails bilaterally that cause pain when ambulating in shoes. She is unable to trim her own nails. Patient is here for further evaluation and treatment.   Past Medical History:  Diagnosis Date  . Cancer ([place])    squamous cell skin cancer on left leg-removed  . CHF (congestive heart failure) ([facility])   . Complication of anesthesia   . GERD (gastroesophageal reflux disease)   . Glaucoma   . High blood pressure   . Hyperlipidemia   . Hypothyroidism   . OP (osteoporosis)   . PONV (postoperative nausea and vomiting)   . Postsurgical hypothyroidism   . 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 Tachycardia   . TIA (transient ischemic attack)       Objective: Physical Exam General: The patient is alert and oriented x3 in no acute distress.  Dermatology: Nails are tender, long, thickened and dystrophic with subungual debris, consistent with onychomycosis, 1-5 bilateral. No signs of infection noted. Skin is cool, dry and supple bilateral lower extremities. Negative for open lesions or macerations.  Vascular: Palpable pedal pulses bilaterally. No edema or erythema noted. Capillary refill within normal limits.  Neurological: Epicritic and protective threshold grossly intact bilaterally.   Musculoskeletal Exam: All pedal and ankle joints range of motion within normal limits bilateral. Muscle strength 5/5 in all groups bilateral. Hammertoe contracture deformity noted to digits 2-5 of the right foot. Pain with palpation to the 2nd MPJ of the right foot.    Assessment: 1. Hammertoes  digits 2-5 right 2. 2nd MPJ capsulitis right  3. Onychodystrophic nails 1-5 bilateral with hyperkeratosis of nails.  4. Onychomycosis of nail due to dermatophyte bilateral   Plan of Care:  1. Patient evaluated.   2. Mechanical debridement of nails 1-5 bilaterally performed using a nail nipper. Filed with dremel without incident.  3. Silicone toe caps dispensed.  4. Continue taking OTC Tylenol.  5. Recommended good shoe gear.  6. Return to clinic as needed.     Edrick Kins, DPM [facility]  Dr. Edrick Kins, DPM    [address], [place] [address]                Office [phone]  Fax [phone]

## 2018-02-03 ENCOUNTER — Encounter: Payer: Self-pay | Admitting: *Deleted

## 2018-02-03 NOTE — Progress Notes (Signed)
Confirmed with patient that she saw Dr. Watt Climes and has an endoscopy scheduled for 02/08/18

## 2018-02-08 ENCOUNTER — Encounter: Payer: Self-pay | Admitting: Oncology

## 2018-02-21 ENCOUNTER — Inpatient Hospital Stay: Payer: Medicare Other | Attending: Oncology

## 2018-02-21 DIAGNOSIS — I5032 Chronic diastolic (congestive) heart failure: Secondary | ICD-10-CM | POA: Diagnosis not present

## 2018-02-21 DIAGNOSIS — C227 Other specified carcinomas of liver: Secondary | ICD-10-CM | POA: Diagnosis not present

## 2018-02-21 DIAGNOSIS — C801 Malignant (primary) neoplasm, unspecified: Secondary | ICD-10-CM

## 2018-02-21 DIAGNOSIS — I110 Hypertensive heart disease with heart failure: Secondary | ICD-10-CM | POA: Insufficient documentation

## 2018-02-21 LAB — CMP (CANCER CENTER ONLY)
ALK PHOS: 70 U/L (ref 38–126)
ALT: 22 U/L (ref 0–44)
ANION GAP: 11 (ref 5–15)
AST: 27 U/L (ref 15–41)
Albumin: 3.6 g/dL (ref 3.5–5.0)
BUN: 19 mg/dL (ref 8–23)
CALCIUM: 8.8 mg/dL — AB (ref 8.9–10.3)
CHLORIDE: 103 mmol/L (ref 98–111)
CO2: 26 mmol/L (ref 22–32)
Creatinine: 0.96 mg/dL (ref 0.44–1.00)
GFR, Estimated: 53 mL/min — ABNORMAL LOW (ref >60)
GLUCOSE: 90 mg/dL (ref 70–99)
Potassium: 4.4 mmol/L (ref 3.5–5.1)
SODIUM: 140 mmol/L (ref 135–145)
Total Bilirubin: 0.6 mg/dL (ref 0.3–1.2)
Total Protein: 7.2 g/dL (ref 6.5–8.1)

## 2018-02-22 ENCOUNTER — Ambulatory Visit
Admission: RE | Admit: 2018-02-22 | Discharge: 2018-02-22 | Disposition: A | Discharge status: Home / Self Care | Payer: Medicare Other | Source: Ambulatory Visit | Attending: Oncology | Admitting: Oncology

## 2018-02-22 DIAGNOSIS — C801 Malignant (primary) neoplasm, unspecified: Secondary | ICD-10-CM

## 2018-02-22 MED ORDER — GADOBENATE DIMEGLUMINE 529 MG/ML IV SOLN
13.0000 mL | Freq: Once | INTRAVENOUS | Status: AC | PRN
  Administered 2018-02-22: 13 mL via INTRAVENOUS

## 2018-02-23 ENCOUNTER — Inpatient Hospital Stay: Payer: Medicare Other | Admitting: Oncology

## 2018-02-23 ENCOUNTER — Telehealth: Payer: Self-pay | Admitting: Oncology

## 2018-02-23 VITALS — BP 159/69 | HR 64 | Temp 97.9°F | Resp 17 | Ht 62.0 in | Wt 145.0 lb

## 2018-02-23 DIAGNOSIS — I110 Hypertensive heart disease with heart failure: Secondary | ICD-10-CM | POA: Diagnosis not present

## 2018-02-23 DIAGNOSIS — I5032 Chronic diastolic (congestive) heart failure: Secondary | ICD-10-CM | POA: Diagnosis not present

## 2018-02-23 DIAGNOSIS — C227 Other specified carcinomas of liver: Secondary | ICD-10-CM | POA: Diagnosis not present

## 2018-02-23 DIAGNOSIS — C801 Malignant (primary) neoplasm, unspecified: Secondary | ICD-10-CM

## 2018-02-23 NOTE — Patient Instructions (Signed)
Please provide copy of Advanced Directive/Living Will at your next visit to have scanned into your medical record.

## 2018-02-23 NOTE — Telephone Encounter (Signed)
Printed calendar and avs.

## 2018-02-23 NOTE — Progress Notes (Signed)
Summer Nielsen OFFICE PROGRESS NOTE   Diagnosis: Unknown primary carcinoma  INTERVAL HISTORY:   Summer Nielsen returns as scheduled.  She reports the postprandial abdominal discomfort has improved.  She saw Dr. Watt Nielsen and started an increased dose of Prilosec and a probiotic. Good appetite and energy level.  Objective:  Vital signs in last 24 hours:  Blood pressure (!) 159/69, pulse 64, temperature 97.9 F (36.6 C), temperature source Oral, resp. rate 17, height _0  (1.575 m), weight 145 lb (65.8 kg), SpO2 96 %.    HEENT: Neck without mass Resp: Inspiratory rales at the lower posterior chest bilaterally Cardio: Regular rate and rhythm GI: No hepatomegaly, no mass, nontender Vascular: No leg edema   Lab Results:  Lab Results  Component Value Date   WBC 8.7 09/22/2017   HGB 12.1 09/22/2017   HCT 37.2 09/22/2017   MCV 97.1 09/22/2017   PLT 361 09/22/2017   NEUTROABS 4.7 08/31/2017    CMP  Lab Results  Component Value Date   NA 140 02/21/2018   K 4.4 02/21/2018   CL 103 02/21/2018   CO2 26 02/21/2018   GLUCOSE 90 02/21/2018   BUN 19 02/21/2018   CREATININE 0.96 02/21/2018   CALCIUM 8.8 (L) 02/21/2018   PROT 7.2 02/21/2018   ALBUMIN 3.6 02/21/2018   AST 27 02/21/2018   ALT 22 02/21/2018   ALKPHOS 70 02/21/2018   BILITOT 0.6 02/21/2018   GFRNONAA 53 (L) 02/21/2018   GFRAA >60 02/21/2018      Imaging:  Mr Abdomen W Wo Contrast  Result Date: 02/22/2018 CLINICAL DATA:  Treated cholangiocarcinoma follow-up exam EXAM: MRI ABDOMEN WITHOUT AND WITH CONTRAST TECHNIQUE: Multiplanar multisequence MR imaging of the abdomen was performed both before and after the administration of intravenous contrast. CONTRAST:  51m MULTIHANCE GADOBENATE DIMEGLUMINE 529 MG/ML IV SOLN COMPARISON:  CT 01/23/2018, MRI 09/13/2017 FINDINGS: Lower chest:  Lung bases are clear. Hepatobiliary: Lesion in the central LEFT hepatic lobe measures 4.0 by 4.7 cm (image 41/10) compared  with 4.5 by 3.5 cm on MRI 09/13/2017. Lesion has decreased internal enhancement compared to prior exams (image 44/13). There are some internal septations through the lesion which are new. This dominant round mass is also increased T2 signal compared to prior (series 14 and series 5). Some local intrahepatic duct dilatation along the lesion in the inferior RIGHT hepatic lobe. Within the superior aspect of the central LEFT hepatic lobe there is a 11 mm enhancing (image 23/12) lesion which is not seen on comparison exam. Additional hyperintense lesion on T2 weighted imaging measuring 9 mm in the RIGHT hepatic lobe (image 9/5) which is also not seen on prior. Both these lesions are well seen on the diffusion-weighted imaging as hyperintense lesions (series 6). Pancreas: Normal pancreatic parenchymal intensity. No ductal dilatation or inflammation. Spleen: Normal spleen. Adrenals/urinary tract: Adrenal glands normal. Bilateral nonenhancing renal cysts. Stomach/Bowel: Stomach and limited of the small bowel is unremarkable Vascular/Lymphatic: Abdominal aortic normal caliber. No retroperitoneal periportal lymphadenopathy. Musculoskeletal: Posterior lumbar fusion. IMPRESSION: 1. Dominant round mass in the central LEFT hepatic lobe slightly in size and decreased in central enhancement with increased cystic change suggesting internal necrosis related to radiation therapy. Increase in size may relate to radiation therapy. 2. Unfortunately, however there are at least 2 new lesions in the liver concerning for new small hepatic metastasis. Electronically Signed   By: SSuzy BouchardM.D.   On: 02/22/2018 15:02    Medications: I have reviewed the patient's current medications.  Assessment/Plan: 1. Anterior liver mass, ultrasound-guided biopsy 09/22/2017-adenocarcinoma ? CT chest 09/07/2017, 4.1 cm hypodense anterior liver mass, changes of chronic hypersensitivity pneumonitis ? MRI abdomen 09/13/2017-isolated liver mass  measuring 4.7 x 3.8 cm involving segments 4B and 5, no additional liver masses ? SBRT to the liver mass beginning 10/31/2017, completed 11/10/2017 ? CT abdomen/pelvis 01/23/2018- radiation changes in the liver, liver mass measuring 5.9 x 4.4 x 5.0 cm but no evidence of metastatic disease in the abdomen or pelvis ? MRI abdomen 02/22/2018- dominant central left liver lesion slightly increased in size with decreased central enhancement suggesting necrosis, 2 new lesions including an 11 mm left hepatic lesion and a 9 mm right hepatic lesion 2. Abdominal pain 3. History of diastolic heart failure 4. Hypertension 5. G3, P3 6. History of anemia   Disposition: Summer Nielsen appears unchanged.  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  The restaging MRI is consistent with development of small new liver lesions.  She appears asymptomatic from the adenocarcinoma at present.  I doubt the postprandial symptoms are related to the malignancy.  The postprandial discomfort has improved.  We discussed treatment options.  We discussed chemotherapy versus observation.  I recommend observation since she does not appear symptomatic from the cancer and no therapy will be curative.  She is comfortable with observation.  She will return for an office visit in 3 months.  We will plan for a restaging CT or MRI in 4-6 months.  Betsy Coder, MD  02/23/2018  2:36 PM

## 2018-03-03 ENCOUNTER — Telehealth: Payer: Self-pay | Admitting: *Deleted

## 2018-03-03 NOTE — Telephone Encounter (Signed)
Patient has been thinking and talking with family and has decided she wants to speak with Dr. Benay Spice about getting on treatment again. Not comfortable with observation approach. Informed her MD will be notified on Monday when he is back in the office.

## 2018-03-07 ENCOUNTER — Telehealth: Payer: Self-pay | Admitting: Oncology

## 2018-03-07 NOTE — Telephone Encounter (Signed)
Scheduled appt per 12/30 sch message - pt is aware of appt date and time

## 2018-03-21 ENCOUNTER — Telehealth: Payer: Self-pay | Admitting: Oncology

## 2018-03-21 ENCOUNTER — Inpatient Hospital Stay: Payer: Medicare Other | Attending: Oncology | Admitting: Oncology

## 2018-03-21 ENCOUNTER — Inpatient Hospital Stay: Payer: Medicare Other

## 2018-03-21 VITALS — BP 140/67 | HR 69 | Temp 98.2°F | Resp 18 | Ht 62.0 in | Wt 145.0 lb

## 2018-03-21 DIAGNOSIS — Z79899 Other long term (current) drug therapy: Secondary | ICD-10-CM

## 2018-03-21 DIAGNOSIS — I110 Hypertensive heart disease with heart failure: Secondary | ICD-10-CM | POA: Insufficient documentation

## 2018-03-21 DIAGNOSIS — R6881 Early satiety: Secondary | ICD-10-CM | POA: Diagnosis not present

## 2018-03-21 DIAGNOSIS — C801 Malignant (primary) neoplasm, unspecified: Secondary | ICD-10-CM

## 2018-03-21 DIAGNOSIS — I5032 Chronic diastolic (congestive) heart failure: Secondary | ICD-10-CM | POA: Diagnosis not present

## 2018-03-21 DIAGNOSIS — C787 Secondary malignant neoplasm of liver and intrahepatic bile duct: Secondary | ICD-10-CM | POA: Insufficient documentation

## 2018-03-21 LAB — CBC WITH DIFFERENTIAL (CANCER CENTER ONLY)
Abs Immature Granulocytes: 0.03 10*3/uL (ref 0.00–0.07)
BASOS ABS: 0 10*3/uL (ref 0.0–0.1)
Basophils Relative: 0 %
EOS PCT: 5 %
Eosinophils Absolute: 0.4 10*3/uL (ref 0.0–0.5)
HCT: 35.9 % — ABNORMAL LOW (ref 36.0–46.0)
HEMOGLOBIN: 11.8 g/dL — AB (ref 12.0–15.0)
Immature Granulocytes: 0 %
LYMPHS PCT: 15 %
Lymphs Abs: 1.2 10*3/uL (ref 0.7–4.0)
MCH: 32.4 pg (ref 26.0–34.0)
MCHC: 32.9 g/dL (ref 30.0–36.0)
MCV: 98.6 fL (ref 80.0–100.0)
Monocytes Absolute: 1 10*3/uL (ref 0.1–1.0)
Monocytes Relative: 12 %
NEUTROS ABS: 5.5 10*3/uL (ref 1.7–7.7)
NEUTROS PCT: 68 %
NRBC: 0 % (ref 0.0–0.2)
Platelet Count: 276 10*3/uL (ref 150–400)
RBC: 3.64 MIL/uL — AB (ref 3.87–5.11)
RDW: 12.4 % (ref 11.5–15.5)
WBC: 8.2 10*3/uL (ref 4.0–10.5)

## 2018-03-21 LAB — CMP (CANCER CENTER ONLY)
ALBUMIN: 3.8 g/dL (ref 3.5–5.0)
ALK PHOS: 78 U/L (ref 38–126)
ALT: 22 U/L (ref 0–44)
ANION GAP: 10 (ref 5–15)
AST: 25 U/L (ref 15–41)
BUN: 21 mg/dL (ref 8–23)
CO2: 30 mmol/L (ref 22–32)
Calcium: 9.1 mg/dL (ref 8.9–10.3)
Chloride: 101 mmol/L (ref 98–111)
Creatinine: 1.01 mg/dL — ABNORMAL HIGH (ref 0.44–1.00)
GFR, Est AFR Am: 58 mL/min — ABNORMAL LOW (ref >60)
GFR, Estimated: 50 mL/min — ABNORMAL LOW (ref >60)
GLUCOSE: 119 mg/dL — AB (ref 70–99)
POTASSIUM: 3.7 mmol/L (ref 3.5–5.1)
Sodium: 141 mmol/L (ref 135–145)
Total Bilirubin: 0.4 mg/dL (ref 0.3–1.2)
Total Protein: 7.5 g/dL (ref 6.5–8.1)

## 2018-03-21 LAB — LACTATE DEHYDROGENASE: LDH: 246 U/L — ABNORMAL HIGH (ref 98–192)

## 2018-03-21 MED ORDER — PROCHLORPERAZINE MALEATE 5 MG PO TABS: 5.0000 mg | ORAL_TABLET | Freq: Four times a day (QID) | ORAL | 1 refills | Status: DC | PRN

## 2018-03-21 NOTE — Progress Notes (Addendum)
[place] OFFICE PROGRESS NOTE   Diagnosis: Unknown primary carcinoma  INTERVAL HISTORY:   Summer Nielsen returns prior to scheduled follow-up.  She is interested in beginning systemic therapy.  The post prandial abdominal discomfort she was experiencing at her last visit has resolved.  She does note some early satiety but overall has a good appetite.  No nausea.  No diarrhea.  Objective:  Vital signs in last 24 hours:  Blood pressure 140/67, pulse 69, temperature 98.2 F (36.8 C), temperature source Oral, resp. rate 18, height _0  (1.575 m), weight 145 lb (65.8 kg), SpO2 96 %.    HEENT: No thrush or ulcers. Resp: Inspiratory rales at the lower posterior chest bilaterally.  No respiratory distress. Cardio: Regular rate and rhythm. GI: Abdomen soft and nontender.  No hepatomegaly.  No mass. Vascular: No leg edema.   Lab Results:  Lab Results  Component Value Date   WBC 8.7 09/22/2017   HGB 12.1 09/22/2017   HCT 37.2 09/22/2017   MCV 97.1 09/22/2017   PLT 361 09/22/2017   NEUTROABS 4.7 08/31/2017    Imaging:  No results found.  Medications: I have reviewed the patient's current medications.  Assessment/Plan: 1. Anterior liver mass, ultrasound-guided biopsy 09/22/2017-adenocarcinoma, MSI-stable, tumor mutation burden 0, IDH1 R132G, BAP1 loss ? CT chest 09/07/2017, 4.1 cm hypodense anterior liver mass, changes of chronic hypersensitivity pneumonitis ? MRI abdomen 09/13/2017-isolated liver mass measuring 4.7 x 3.8 cm involving segments 4B and 5, no additional liver masses ? SBRT to the liver mass beginning 10/31/2017, completed 11/10/2017 ? CT abdomen/pelvis 01/23/2018- radiation changes in the liver, liver mass measuring 5.9 x 4.4 x 5.0 cm but no evidence of metastatic disease in the abdomen or pelvis ? MRI abdomen 02/22/2018- dominant central left liver lesion slightly increased in size with decreased central enhancement suggesting necrosis, 2 new lesions  including an 11 mm left hepatic lesion and a 9 mm right hepatic lesion 2. Abdominal pain 3. History of diastolic heart failure 4. Hypertension 5. G3, P3 6. History of anemia  Disposition: Summer Nielsen appears stable.  She has adenocarcinoma of unknown primary involving the liver.  She and her family understand this is most likely cholangiocarcinoma.  Recent restaging MRI showed development of small new liver lesions.  She appears asymptomatic from the cancer at present.  She would like to begin some form of treatment.  She understands that no therapy Nielsen be curative.  Dr. Benay Spice reviewed the treatment options to include gemcitabine/cisplatin (likely substitute carboplatin) versus single agent gemcitabine versus single agent Xeloda.  Potential toxicities associated with each of the chemotherapy drugs were reviewed in detail.  She is most comfortable proceeding with single agent Xeloda.  We discussed the potential for bone marrow toxicity, nausea, hair loss, mouth sores, diarrhea, skin rash, increased sensitivity to sun, hand-foot syndrome.  She agrees to proceed.  We Nielsen obtain baseline labs today.  She Nielsen take the Xeloda 7 days on/7 days off.  Anticipate a start date of 03/27/2018.  The [place] pharmacist Nielsen contact her for additional discussion.  We are requesting foundation 1 testing.  She Nielsen return for lab and follow-up on 04/18/2018.  She Nielsen contact the office in the interim with any problems.  Patient seen with Dr. Benay Spice.  25 minutes were spent face-to-face at today's visit with the majority of that time involved in counseling/coordination of care.      Ned Card ANP/GNP-BC   03/21/2018  3:31 PM  This was a  shared visit with Ned Card.  Summer Nielsen was interviewed and examined.  She has adenocarcinoma involving the liver.  The primary tumor site has not been definitively defined, but the tumor most likely represents an intrahepatic cholangiocarcinoma.  Summer Nielsen  understands no therapy Nielsen be curative.  We discussed treatment options.  She would like to proceed with systemic therapy as opposed to observation.  We discussed standard treatment with gemcitabine/cisplatin.  We also discussed single agent gemcitabine, capecitabine, and gemcitabine/carboplatin.  We reviewed the potential toxicities associated with each of these regimens.  She would like to proceed with a trial of capecitabine.  We Nielsen obtain baseline serum tumor markers and plan for a restaging CT or abdomen MRI after 2-3 months of treatment.  Julieanne Manson, MD

## 2018-03-21 NOTE — Telephone Encounter (Signed)
Gave patient avs report and appointments for February. Patient aware Summer Nielsen will address March appointments at February visit.

## 2018-03-22 ENCOUNTER — Telehealth: Payer: Self-pay | Admitting: Pharmacist

## 2018-03-22 DIAGNOSIS — C221 Intrahepatic bile duct carcinoma: Secondary | ICD-10-CM

## 2018-03-22 LAB — CANCER ANTIGEN 19-9: CAN 19-9: 45 U/mL — AB (ref 0–35)

## 2018-03-22 LAB — CEA (IN HOUSE-CHCC): CEA (CHCC-IN HOUSE): 1.96 ng/mL (ref 0.00–5.00)

## 2018-03-22 MED ORDER — CAPECITABINE 500 MG PO TABS: ORAL_TABLET | ORAL | 0 refills | Status: DC

## 2018-03-22 MED FILL — CAPECITABINE 500 MG TABS: 500 | 28 days supply | Qty: 70 | Fill #0

## 2018-03-22 NOTE — Telephone Encounter (Signed)
Oral Oncology Patient Advocate Encounter  Confirmed with [place] that Xeloda was shipped on 03/22/18 with a $50 copay.   [place] Patient [place] Phone [phone] Fax [phone]

## 2018-03-22 NOTE — Telephone Encounter (Signed)
Oral Chemotherapy Pharmacist Encounter   I spoke with patient for overview of: Xeloda (capecitabine) for the treatment of metastatic adenocarcinoma, likely intrahepatic cholangiocarcinoma, now progressive after receiving definitive radiation, planned duration until disease progression or unacceptable toxicity.   Counseled patient on administration, dosing, side effects, monitoring, drug-food interactions, safe handling, storage, and disposal.  Patient will take Xeloda 565m tablets, 3 tablets (15090m by mouth in AM and 2 tabs (100061mby mouth in PM, within 30 minutes of finishing meals, on days 1-7 & 15-21 of each 28 day cycle.  We discussed that this dosing scheme is equivalent to 1 week on, 1 week off, and repeated.  Xeloda start date: 03/27/2018  Adverse effects include but are not limited to: fatigue, decreased blood counts, GI upset, diarrhea, mouth sores, and hand-foot syndrome.  Patient has anti-emetic on hand and knows to take it if nausea develops.   Patient will obtain anti diarrheal and alert the office of 4 or more loose stools above baseline.  Reviewed with patient importance of keeping a medication schedule and plan for any missed doses.  Ms. MilVanbuskirkiced understanding and appreciation.   All questions answered. Medication reconciliation performed and medication/allergy list updated. Patient will discontinue use of multivitamin that contains folic acid and try to find one without this vitamin.  1st fill of Xeloda will ship from the [facility] to deliver to patient's home tomorrow (03/23/2018). Copayment is $50 for this 1st fill, this is affordable to patient. [id number] at completion of our conversation for shipment coordination.   Patient informed the pharmacy will contact her 5-7 days prior to needing next fill of Xeloda to coordinate continued medication acquisition.  Patient knows to call the  office with questions or concerns. Oral Oncology Clinic will continue to follow.  JesJohny DrillingharmD, BCPS, BCOP  03/22/2018   1:17 PM Oral Oncology Clinic [phone]

## 2018-03-22 NOTE — Telephone Encounter (Signed)
Oral Oncology Pharmacist Encounter  Received new prescription for Xeloda (capecitabine) for the treatment of metastatic adenocarcinoma, likely intrahepatic cholangiocarcinoma, now progressive after receiving definitive radiation, planned duration until disease progression or unacceptable toxicity.  Patient is s/p SBRT 10/19/17-11/10/17 Abdominal MRI performed 02/22/18 shows new liver lesions.  Patient is under evaluation to begin treatment with Xeloda monotherpy  Labs from 03/21/2018 assessed, OK for treatment. SCr=1.01, est CrCl ~ 40 mL/min  Xeloda is planned to be dosed at ~735 mg/m2 PO BID for 7 days on, 7 days off, and repeated  Current medication list in Epic reviewed, possible DDI with omeprazole and Xeloda identified:  Category C interaction: Inconsistent retrospective data show possible decrease in efficacy when Xeloda is used for the adjuvant treatment of gastric cancer concurrently with PPI. Some retrospective data shows no decrease in benefit with PPI is used concurrently with Xeloda. Patient will likely have return of intolerable gastric symptoms with PPI discontinuation. No change to current therapy is indicated at this time.  Prescription has been e-scribed to the [facility] for benefits analysis and approval.  Oral Oncology Clinic will continue to follow for insurance authorization, copayment issues, initial counseling and start date.  Summer Nielsen, PharmD, BCPS, BCOP  03/22/2018 10:49 AM Oral Oncology Clinic [phone]

## 2018-03-23 ENCOUNTER — Encounter: Payer: Self-pay | Admitting: Cardiology

## 2018-03-23 ENCOUNTER — Ambulatory Visit: Payer: Medicare Other | Admitting: Cardiology

## 2018-03-23 VITALS — BP 110/50 | HR 65 | Ht 62.0 in | Wt 146.0 lb

## 2018-03-23 DIAGNOSIS — I5032 Chronic diastolic (congestive) heart failure: Secondary | ICD-10-CM | POA: Diagnosis not present

## 2018-03-23 DIAGNOSIS — I10 Essential (primary) hypertension: Secondary | ICD-10-CM

## 2018-03-23 DIAGNOSIS — E785 Hyperlipidemia, unspecified: Secondary | ICD-10-CM

## 2018-03-23 DIAGNOSIS — C227 Other specified carcinomas of liver: Secondary | ICD-10-CM | POA: Diagnosis not present

## 2018-03-23 NOTE — Progress Notes (Signed)
Cardiology Office Note   Date:  03/25/2018   ID:  Summer Nielsen, DOB 1927/11/17, MRN [id number]  PCP:  Maurice Small, MD  Cardiologist:  Dr. Marlou Porch    Chief Complaint  Patient presents with  . Congestive Heart Failure      History of Present Illness: Summer Nielsen is a 85 y.o. female who presents for diastolic HF.    She has a hx of acute diastolic heart failure with elevated BNP, grade 2 diastolic dysfunction on echocardiogram, pulmonary infiltrate.  In July 2017, she began having more shortness of breath and was diagnosed originally with pneumonia. No fever, no chills. Treated with antibiotic course. She never improved that much. Dr. Justin Mend checked an echocardiogram which showed grade 2 diastolic dysfunction and BNP which was 200. She placed her on Lasix 40 mg once a day. Her lower extremity edema has improved. Her breathing is better but not completely resolved. Definitely improved.  Previously had bradycardia with Inderal 3 times a day and is now on to use times a day.   No squeezing CP up neck as she had previously transiently.   09/23/17 -was seeing Dr. Melvyn Novas for evaluation of potential pulmonary fibrosis.  Had liver biopsy, for a 4 cm liver mass seen on CT scan that was being performed for lungs.  She also had coronary artery calcification three-vessel noted.  She has a low risk no ischemia on her nuclear stress test. She is a bit worried about the potential results of this liver mass.  She is going to be seeing Dr. Watt Climes for further evaluation as well.  She is happy however that she did very well with fusion of her back earlier this year.    Today she tells me she had radiation to her liver and one spot did shrink but 2 more came up so now she will do po chemo with Xeloda - she will do one week on and one week off.    We discussed if her BP was lower and if she developed lightheadedness to call and we would decrease BP meds.  No Chest pain or SOB. No edema.  She is  tolerating her medications.    Past Medical History:  Diagnosis Date  . Cancer ([place])    squamous cell skin cancer on left leg-removed  . CHF (congestive heart failure) ([place])   . Complication of anesthesia   . GERD (gastroesophageal reflux disease)   . Glaucoma   . High blood pressure   . Hyperlipidemia   . Hypothyroidism   . OP (osteoporosis)   . PONV (postoperative nausea and vomiting)   . Postsurgical hypothyroidism   . 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 Tachycardia   . TIA (transient ischemic attack)     Past Surgical History:  Procedure Laterality Date  . ABDOMINAL HYSTERECTOMY    . BACK SURGERY N/A 03/2017   Lumbar  . CHOLECYSTECTOMY    . EYE SURGERY     removed cateracts on both eyes  . JOINT REPLACEMENT     3 hip replacement on left, right knee replacement  . THYROIDECTOMY       Current Outpatient Medications  Medication Sig Dispense Refill  . acetaminophen (TYLENOL) 500 MG tablet Take 1,000 mg by mouth daily as needed for moderate pain or headache.    Marland Kitchen amLODipine (NORVASC) 5 MG tablet Take 5 mg by mouth daily.    Marland Kitchen atorvastatin (LIPITOR) 20 MG tablet Take 20 mg by mouth daily.  3  . bimatoprost (LUMIGAN) 0.01 % SOLN Place 1 drop into both eyes at bedtime.     . calcium-vitamin D (OSCAL WITH D) 500-200 MG-UNIT tablet Take 1 tablet by mouth daily.     . capecitabine (XELODA) 500 MG tablet Take 3 tablets (1559m) by mouth in AM & 2 tabs (10021m in PM, immediately after food. Take on days 1-7 & 15-21 of each 28d cycle 70 tablet 0  . furosemide (LASIX) 40 MG tablet Take 40 mg by mouth 2 (two) times daily.    . Marland Kitchenabapentin (NEURONTIN) 300 MG capsule TAKE 1 CAPSULE BY MOUTH THREE TIMES A DAY 270 capsule 3  . ibuprofen (ADVIL,MOTRIN) 200 MG tablet Take 200 mg by mouth every 6 (six) hours as needed for moderate pain (back surgery).    . Iron-FA-B Cmp-C-Biot-Probiotic (FUSION PLUS) CAPS Take 1 capsule by mouth daily.    . Marland Kitchenlevothyroxine (SYNTHROID, LEVOTHROID) 125 MCG tablet Take 125 mcg by mouth daily.  11  . losartan (COZAAR) 50 MG tablet Take 50 mg by mouth daily.    . Multiple Vitamins-Minerals (CENTRUM SILVER PO) Take 1 tablet by mouth daily.     . Marland Kitchenmeprazole (PRILOSEC) 20 MG capsule Take 30- 60 min before your first and last meals of the day    . prochlorperazine (COMPAZINE) 5 MG tablet Take 1 tablet (5 mg total) by mouth every 6 (six) hours as needed for nausea or vomiting. 30 tablet 1  . propranolol (INDERAL) 20 MG tablet Take 20 mg by mouth 2 (two) times daily.      No current facility-administered medications for this visit.     Allergies:   Levofloxacin and Percocet [oxycodone-acetaminophen]    Social History:  The patient  reports that she has never smoked. She has never used smokeless tobacco. She reports that she does not drink alcohol or use drugs.   Family History:  The patient's family history includes Cerebral aneurysm in her mother; Heart attack in her father and mother; Heart disease in her brother and sister; Hypertension in her mother; Stroke in her mother.    ROS:  General:no colds or fevers, no weight changes Skin:no rashes or ulcers HEENT:no blurred vision, no congestion CV:see HPI PUL:see HPI GI:no diarrhea constipation or melena, no indigestion GU:no hematuria, no dysuria MS:no joint pain, no claudication Neuro:no syncope, no lightheadedness Endo:no diabetes, no thyroid disease  Wt Readings from Last 3 Encounters:  03/23/18 146 lb (66.2 kg)  03/21/18 145 lb (65.8 kg)  02/23/18 145 lb (65.8 kg)     PHYSICAL EXAM: VS:  BP (!) 110/50   Pulse 65   Ht _0  (1.575 m)   Wt 146 lb (66.2 kg)   LMP  (LMP Unknown)   SpO2 95%   BMI 26.70 kg/m  , BMI Body mass index is 26.7 kg/m. General:Pleasant affect, NAD Skin:Warm and dry, brisk capillary refill HEENT:normocephalic, sclera clear, mucus membranes moist Neck:supple, no JVD, no bruits  Heart:S1S2 RRR without murmur,  gallup, rub or click Lungs:clear without rales, few rhonchi in bases, no wheezes AbXNT:ZGYFnon tender, + BS, do not palpate liver spleen or masses Ext:no lower ext edema, 2+ pedal pulses, 2+ radial pulses Neuro:alert and oriented X 3, MAE, follows commands, + facial symmetry    EKG:  EKG is NOT ordered today.    Recent Labs: 08/08/2017: TSH 9.900 08/16/2017: NT-Pro BNP 655 03/21/2018: ALT 22; BUN 21; Creatinine 1.01; Hemoglobin 11.8; Platelet Count 276; Potassium 3.7; Sodium 141    Lipid  Panel    Component Value Date/Time   CHOL 164 03/23/2018 1418   TRIG 169 (H) 03/23/2018 1418   HDL 45 03/23/2018 1418   CHOLHDL 3.6 03/23/2018 1418   LDLCALC 85 03/23/2018 1418       Other studies Reviewed: Additional studies/ records that were reviewed today include:   ECHO 10/24/15: - Left ventricle: The cavity size was normal. Systolic function was normal. The estimated ejection fraction was in the range of 60% to 65%. Wall motion was normal; there were no regional wall motion abnormalities. Features are consistent with a pseudonormal left ventricular filling pattern, with concomitant abnormal relaxation and increased filling pressure (grade 2 diastolic dysfunction). - Aortic valve: There was mild regurgitation. - Mitral valve: Calcified annulus. - Pulmonary arteries: Systolic pressure was mildly increased. PA peak pressure: 34 mm Hg (S).  Nuclear stress test 06/07/2016-low risk with no ischemia  CT scan, high-resolution on 09/07/2017-4 cm liver mass noted.  Also three-vessel coronary artery calcification noted as well.  Prior lab work, office notes, EKG reviewed. Hemoglobin 11. Creatinine 0.8. BNP 200.  ASSESSMENT AND PLAN:  1.  Chronic diastolic HF, euvolemic today. No SOB.  Feels well  Continue lasix.  Follow up with Dr. Marlou Porch in 6 months.  2.  Essential HTN with lower BP today with oral chemo BP may be lower depending on appetite.  Discussed adding an ensure  per day if develops wt loss.  Also if lightheadedness or dizziness    3.  HLD will recheck lipids other labs recently checked at the cancer center  4.  Liver cancer per oncology.Xeloda (capecitabine)  Current medicines are reviewed with the patient today.  The patient Has no concerns regarding medicines.  The following changes have been made:  See above Labs/ tests ordered today include:see above  Disposition:   FU:  see above  Signed, Cecilie Kicks, NP  03/25/2018 10:12 PM    [facility] [place], [address], [place], [place] Phone: [phone]; Fax: [phone]

## 2018-03-23 NOTE — Patient Instructions (Signed)
Medication Instructions:   Your physician recommends that you continue on your current medications as directed. Please refer to the Current Medication list given to you today.  If you need a refill on your cardiac medications before your next appointment, please call your pharmacy.   Lab work: LIPID TODAY    If you have labs (blood work) drawn today and your tests are completely normal, you will receive your results only by: Marland Kitchen MyChart Message (if you have MyChart) OR . A paper copy in the mail If you have any lab test that is abnormal or we need to change your treatment, we will call you to review the results.  Testing/Procedures: NONE ORDERED  TODAY   Follow-Up: At [facility], you and your health needs are our priority.  As part of our continuing mission to provide you with exceptional heart care, we have created designated Provider Care Teams.  These Care Teams include your primary Cardiologist (physician) and Advanced Practice Providers (APPs -  Physician Assistants and Nurse Practitioners) who all work together to provide you with the care you need, when you need it. You will need a follow up appointment in 6 months.  Please call our office 2 months in advance to schedule this appointment.  You may see No primary care provider on file. Dr. Marlou Porch  or one of the following Advanced Practice Providers on your designated Care Team:   Truitt Merle, NP Cecilie Kicks, NP . Kathyrn Drown, NP  Any Other Special Instructions Will Be Listed Below (If Applicable).

## 2018-03-24 ENCOUNTER — Telehealth: Payer: Self-pay | Admitting: *Deleted

## 2018-03-24 LAB — LIPID PANEL
CHOLESTEROL TOTAL: 164 mg/dL (ref 100–199)
Chol/HDL Ratio: 3.6 ratio (ref 0.0–4.4)
HDL: 45 mg/dL (ref >39)
LDL Calculated: 85 mg/dL (ref 0–99)
TRIGLYCERIDES: 169 mg/dL — AB (ref 0–149)
VLDL Cholesterol Cal: 34 mg/dL (ref 5–40)

## 2018-03-24 NOTE — Telephone Encounter (Signed)
Called pt re: lab results, no voicemail / no answering machine.

## 2018-03-24 NOTE — Telephone Encounter (Signed)
-----  Message from Isaiah Serge, NP sent at 03/24/2018  1:49 PM EST ----- Cholesterol is stable no changes

## 2018-03-25 ENCOUNTER — Encounter: Payer: Self-pay | Admitting: Cardiology

## 2018-03-28 NOTE — Telephone Encounter (Signed)
Called pt re: lab results.  See result note.

## 2018-04-03 ENCOUNTER — Encounter (HOSPITAL_COMMUNITY): Payer: Self-pay | Admitting: Oncology

## 2018-04-17 ENCOUNTER — Other Ambulatory Visit: Payer: Self-pay | Admitting: Oncology

## 2018-04-17 DIAGNOSIS — C221 Intrahepatic bile duct carcinoma: Secondary | ICD-10-CM

## 2018-04-18 ENCOUNTER — Inpatient Hospital Stay: Payer: Medicare Other

## 2018-04-18 ENCOUNTER — Telehealth: Payer: Self-pay | Admitting: *Deleted

## 2018-04-18 ENCOUNTER — Telehealth: Payer: Self-pay | Admitting: Nurse Practitioner

## 2018-04-18 ENCOUNTER — Inpatient Hospital Stay: Payer: Medicare Other | Attending: Oncology | Admitting: Nurse Practitioner

## 2018-04-18 DIAGNOSIS — C787 Secondary malignant neoplasm of liver and intrahepatic bile duct: Secondary | ICD-10-CM | POA: Insufficient documentation

## 2018-04-18 DIAGNOSIS — Z801 Family history of malignant neoplasm of trachea, bronchus and lung: Secondary | ICD-10-CM | POA: Insufficient documentation

## 2018-04-18 DIAGNOSIS — R109 Unspecified abdominal pain: Secondary | ICD-10-CM | POA: Insufficient documentation

## 2018-04-18 DIAGNOSIS — R197 Diarrhea, unspecified: Secondary | ICD-10-CM | POA: Insufficient documentation

## 2018-04-18 DIAGNOSIS — I5032 Chronic diastolic (congestive) heart failure: Secondary | ICD-10-CM | POA: Insufficient documentation

## 2018-04-18 DIAGNOSIS — I110 Hypertensive heart disease with heart failure: Secondary | ICD-10-CM | POA: Insufficient documentation

## 2018-04-18 MED FILL — CAPECITABINE 500 MG TABS: 500 | 28 days supply | Qty: 70 | Fill #0

## 2018-04-18 NOTE — Telephone Encounter (Signed)
Returned patient's call. Made patient aware of lab appointment at 2:15 pm, and an appointment with Lattie Haw, NP at 2:45 pm- patient verbalized understanding, and appreciation for the call.

## 2018-04-18 NOTE — Telephone Encounter (Signed)
scheduled appt per 2/11 sch message - unable to reach patient . Left message on mobile number - unable to leave message on home number.

## 2018-04-21 ENCOUNTER — Encounter: Payer: Self-pay | Admitting: Nurse Practitioner

## 2018-04-21 ENCOUNTER — Telehealth: Payer: Self-pay

## 2018-04-21 ENCOUNTER — Inpatient Hospital Stay: Payer: Medicare Other

## 2018-04-21 ENCOUNTER — Inpatient Hospital Stay (HOSPITAL_BASED_OUTPATIENT_CLINIC_OR_DEPARTMENT_OTHER): Payer: Medicare Other | Admitting: Nurse Practitioner

## 2018-04-21 VITALS — BP 136/61 | HR 70 | Temp 97.8°F | Resp 19 | Ht 62.0 in | Wt 145.8 lb

## 2018-04-21 DIAGNOSIS — R109 Unspecified abdominal pain: Secondary | ICD-10-CM | POA: Diagnosis not present

## 2018-04-21 DIAGNOSIS — I110 Hypertensive heart disease with heart failure: Secondary | ICD-10-CM | POA: Diagnosis not present

## 2018-04-21 DIAGNOSIS — I5032 Chronic diastolic (congestive) heart failure: Secondary | ICD-10-CM | POA: Diagnosis not present

## 2018-04-21 DIAGNOSIS — C801 Malignant (primary) neoplasm, unspecified: Secondary | ICD-10-CM | POA: Diagnosis not present

## 2018-04-21 DIAGNOSIS — R197 Diarrhea, unspecified: Secondary | ICD-10-CM | POA: Diagnosis not present

## 2018-04-21 DIAGNOSIS — Z801 Family history of malignant neoplasm of trachea, bronchus and lung: Secondary | ICD-10-CM | POA: Diagnosis not present

## 2018-04-21 DIAGNOSIS — C787 Secondary malignant neoplasm of liver and intrahepatic bile duct: Secondary | ICD-10-CM | POA: Diagnosis present

## 2018-04-21 LAB — CBC WITH DIFFERENTIAL (CANCER CENTER ONLY)
Abs Immature Granulocytes: 0.04 10*3/uL (ref 0.00–0.07)
Basophils Absolute: 0 10*3/uL (ref 0.0–0.1)
Basophils Relative: 0 %
EOS PCT: 5 %
Eosinophils Absolute: 0.4 10*3/uL (ref 0.0–0.5)
HEMATOCRIT: 33.9 % — AB (ref 36.0–46.0)
HEMOGLOBIN: 11.4 g/dL — AB (ref 12.0–15.0)
Immature Granulocytes: 1 %
LYMPHS ABS: 1.3 10*3/uL (ref 0.7–4.0)
Lymphocytes Relative: 17 %
MCH: 33.4 pg (ref 26.0–34.0)
MCHC: 33.6 g/dL (ref 30.0–36.0)
MCV: 99.4 fL (ref 80.0–100.0)
MONO ABS: 1.1 10*3/uL — AB (ref 0.1–1.0)
Monocytes Relative: 14 %
Neutro Abs: 4.8 10*3/uL (ref 1.7–7.7)
Neutrophils Relative %: 63 %
Platelet Count: 256 10*3/uL (ref 150–400)
RBC: 3.41 MIL/uL — ABNORMAL LOW (ref 3.87–5.11)
RDW: 14.1 % (ref 11.5–15.5)
WBC Count: 7.7 10*3/uL (ref 4.0–10.5)
nRBC: 0 % (ref 0.0–0.2)

## 2018-04-21 LAB — CMP (CANCER CENTER ONLY)
ALT: 19 U/L (ref 0–44)
AST: 25 U/L (ref 15–41)
Albumin: 3.7 g/dL (ref 3.5–5.0)
Alkaline Phosphatase: 72 U/L (ref 38–126)
Anion gap: 11 (ref 5–15)
BUN: 20 mg/dL (ref 8–23)
CHLORIDE: 103 mmol/L (ref 98–111)
CO2: 27 mmol/L (ref 22–32)
CREATININE: 1.08 mg/dL — AB (ref 0.44–1.00)
Calcium: 8.7 mg/dL — ABNORMAL LOW (ref 8.9–10.3)
GFR, Est AFR Am: 53 mL/min — ABNORMAL LOW (ref >60)
GFR, Estimated: 46 mL/min — ABNORMAL LOW (ref >60)
GLUCOSE: 99 mg/dL (ref 70–99)
Potassium: 3.4 mmol/L — ABNORMAL LOW (ref 3.5–5.1)
SODIUM: 141 mmol/L (ref 135–145)
Total Bilirubin: 0.5 mg/dL (ref 0.3–1.2)
Total Protein: 7 g/dL (ref 6.5–8.1)

## 2018-04-21 NOTE — Telephone Encounter (Signed)
Printed avs and calender of upcoming appointment. Per 2/14 los

## 2018-04-21 NOTE — Progress Notes (Addendum)
[place] OFFICE PROGRESS NOTE   Diagnosis: Unknown primary carcinoma  INTERVAL HISTORY:   Summer Nielsen returns for follow-up.  She began Xeloda 7 days on/7 days off on 03/27/2018.  She denies nausea/vomiting.  No mouth sores.  On each week off she has developed abdominal cramps and diarrhea approximately 4 times a day lasting 3 or 4 days.  The diarrhea tends to occur after eating.  She takes Imodium with improvement.  She notes mild redness on the palms.  No associated pain.  She notes that all of her "sun spots" are darker.  Objective:  Vital signs in last 24 hours:  Blood pressure 136/61, pulse 70, temperature 97.8 F (36.6 C), temperature source Oral, resp. rate 19, height _0  (1.575 m), weight 145 lb 12.8 oz (66.1 kg), SpO2 98 %.    HEENT: No thrush or ulcers. Resp: Lungs clear bilaterally. Cardio: Regular rate and rhythm. GI: Abdomen soft with mild generalized tenderness.  No hepatomegaly. Vascular: No leg edema. Skin: Palms with mild erythema.  Erythematous papular rash over the dorsum of both hands and the forearms.   Lab Results:  Lab Results  Component Value Date   WBC 7.7 04/21/2018   HGB 11.4 (L) 04/21/2018   HCT 33.9 (L) 04/21/2018   MCV 99.4 04/21/2018   PLT 256 04/21/2018   NEUTROABS 4.8 04/21/2018    Imaging:  No results found.  Medications: I have reviewed the patient's current medications.  Assessment/Plan: 1. Anterior liver mass, ultrasound-guided biopsy 09/22/2017-adenocarcinoma, MSI-stable, tumor mutation burden 0, IDH1 R132G, BAP1 loss ? CT chest 09/07/2017, 4.1 cm hypodense anterior liver mass, changes of chronic hypersensitivity pneumonitis ? MRI abdomen 09/13/2017-isolated liver mass measuring 4.7 x 3.8 cm involving segments 4B and 5, no additional liver masses ? SBRT to the liver mass beginning 10/31/2017, completed 11/10/2017 ? CT abdomen/pelvis 01/23/2018- radiation changes in the liver, liver mass measuring 5.9 x 4.4 x 5.0 cm but  no evidence of metastatic disease in the abdomen or pelvis ? MRI abdomen 02/22/2018- dominant central left liver lesion slightly increased in size with decreased central enhancement suggesting necrosis, 2 new lesions including an 11 mm left hepatic lesion and a 9 mm right hepatic lesion ? Xeloda 7 days on/7 days off beginning 03/27/2018 (1500 mg every morning and 1000 mg every afternoon) ? Xeloda dose reduced to 1000 mg every morning and 1000 mg every afternoon beginning 04/24/2018 due to diarrhea 2. Abdominal pain 3. History of diastolic heart failure 4. Hypertension 5. G3, P3 6. History of anemia  Disposition: Ms. Winther appears stable.  She will continue Xeloda 7 days on/7 days off.  She is having diarrhea the week off.  She will decrease Xeloda to 1000 mg every morning and 1000 mg every afternoon when she resumes on 04/24/2018.  She understands the rash over the forearms is related to Xeloda.  We reviewed the CBC from today.  Counts are adequate to proceed as above.  She will return for lab and follow-up on 05/15/2018.  She will contact the office in the interim with any problems.  We specifically discussed worsening/poorly controlled diarrhea, progression of the skin rash.  Patient seen with Dr. Benay Spice.    Ned Card ANP/GNP-BC   04/21/2018  2:56 PM This was a shared visit with Ned Card.  Ms. Hodge was interviewed and examined.  She has developed a rash, diarrhea, and mild hand erythema secondary to Xeloda.  We reduce the Xeloda dose today.  Julieanne Manson, MD

## 2018-04-21 NOTE — Telephone Encounter (Signed)
Error opening

## 2018-04-24 ENCOUNTER — Other Ambulatory Visit (INDEPENDENT_AMBULATORY_CARE_PROVIDER_SITE_OTHER): Payer: Self-pay | Admitting: Specialist

## 2018-04-24 MED ORDER — TRAMADOL-ACETAMINOPHEN 37.5-325 MG PO TABS: 1.0000 | ORAL_TABLET | Freq: Four times a day (QID) | ORAL | 0 refills | Status: DC | PRN

## 2018-04-24 NOTE — Telephone Encounter (Signed)
Patient called left voicemail message needing Rx refilled (Tramadol) The number to contact patient is [phone]

## 2018-05-11 ENCOUNTER — Other Ambulatory Visit: Payer: Self-pay | Admitting: Oncology

## 2018-05-11 DIAGNOSIS — C221 Intrahepatic bile duct carcinoma: Secondary | ICD-10-CM

## 2018-05-15 ENCOUNTER — Inpatient Hospital Stay: Payer: Medicare Other | Attending: Oncology | Admitting: Oncology

## 2018-05-15 ENCOUNTER — Inpatient Hospital Stay: Payer: Medicare Other

## 2018-05-15 ENCOUNTER — Telehealth: Payer: Self-pay | Admitting: Oncology

## 2018-05-15 VITALS — BP 130/61 | HR 66 | Temp 98.4°F | Resp 18 | Ht 62.0 in | Wt 144.4 lb

## 2018-05-15 DIAGNOSIS — C801 Malignant (primary) neoplasm, unspecified: Secondary | ICD-10-CM

## 2018-05-15 DIAGNOSIS — I10 Essential (primary) hypertension: Secondary | ICD-10-CM | POA: Diagnosis not present

## 2018-05-15 DIAGNOSIS — I5032 Chronic diastolic (congestive) heart failure: Secondary | ICD-10-CM

## 2018-05-15 DIAGNOSIS — D649 Anemia, unspecified: Secondary | ICD-10-CM | POA: Diagnosis not present

## 2018-05-15 DIAGNOSIS — Z79899 Other long term (current) drug therapy: Secondary | ICD-10-CM | POA: Diagnosis not present

## 2018-05-15 DIAGNOSIS — C787 Secondary malignant neoplasm of liver and intrahepatic bile duct: Secondary | ICD-10-CM | POA: Diagnosis present

## 2018-05-15 LAB — CBC WITH DIFFERENTIAL (CANCER CENTER ONLY)
Abs Immature Granulocytes: 0.02 10*3/uL (ref 0.00–0.07)
Basophils Absolute: 0 10*3/uL (ref 0.0–0.1)
Basophils Relative: 0 %
Eosinophils Absolute: 0.5 10*3/uL (ref 0.0–0.5)
Eosinophils Relative: 6 %
HCT: 32.8 % — ABNORMAL LOW (ref 36.0–46.0)
Hemoglobin: 10.9 g/dL — ABNORMAL LOW (ref 12.0–15.0)
Immature Granulocytes: 0 %
Lymphocytes Relative: 11 %
Lymphs Abs: 0.8 10*3/uL (ref 0.7–4.0)
MCH: 34.5 pg — ABNORMAL HIGH (ref 26.0–34.0)
MCHC: 33.2 g/dL (ref 30.0–36.0)
MCV: 103.8 fL — ABNORMAL HIGH (ref 80.0–100.0)
Monocytes Absolute: 1.2 10*3/uL — ABNORMAL HIGH (ref 0.1–1.0)
Monocytes Relative: 16 %
Neutro Abs: 4.8 10*3/uL (ref 1.7–7.7)
Neutrophils Relative %: 67 %
Platelet Count: 321 10*3/uL (ref 150–400)
RBC: 3.16 MIL/uL — AB (ref 3.87–5.11)
RDW: 17.2 % — ABNORMAL HIGH (ref 11.5–15.5)
WBC Count: 7.3 10*3/uL (ref 4.0–10.5)
nRBC: 0 % (ref 0.0–0.2)

## 2018-05-15 LAB — COMPREHENSIVE METABOLIC PANEL
ALK PHOS: 65 U/L (ref 38–126)
ALT: 22 U/L (ref 0–44)
ANION GAP: 10 (ref 5–15)
AST: 25 U/L (ref 15–41)
Albumin: 3.3 g/dL — ABNORMAL LOW (ref 3.5–5.0)
BUN: 17 mg/dL (ref 8–23)
CALCIUM: 8.4 mg/dL — AB (ref 8.9–10.3)
CO2: 29 mmol/L (ref 22–32)
Chloride: 101 mmol/L (ref 98–111)
Creatinine, Ser: 1.11 mg/dL — ABNORMAL HIGH (ref 0.44–1.00)
GFR calc Af Amer: 51 mL/min — ABNORMAL LOW (ref >60)
GFR calc non Af Amer: 44 mL/min — ABNORMAL LOW (ref >60)
Glucose, Bld: 115 mg/dL — ABNORMAL HIGH (ref 70–99)
Potassium: 2.8 mmol/L — ABNORMAL LOW (ref 3.5–5.1)
Sodium: 140 mmol/L (ref 135–145)
Total Bilirubin: 0.8 mg/dL (ref 0.3–1.2)
Total Protein: 6.1 g/dL — ABNORMAL LOW (ref 6.5–8.1)

## 2018-05-15 MED ORDER — HYDROMORPHONE HCL 2 MG PO TABS: 1.0000 mg | ORAL_TABLET | ORAL | 0 refills | Status: DC | PRN

## 2018-05-15 MED ORDER — POTASSIUM CHLORIDE ER 10 MEQ PO TBCR: 10.0000 meq | EXTENDED_RELEASE_TABLET | Freq: Two times a day (BID) | ORAL | 1 refills | Status: DC

## 2018-05-15 NOTE — Telephone Encounter (Signed)
Scheduled appt per 3/9 los.  Printed calendar and avs.

## 2018-05-15 NOTE — Progress Notes (Signed)
[place] OFFICE PROGRESS NOTE   Diagnosis: Metastatic adenocarcinoma  INTERVAL HISTORY:   Summer Nielsen returns for a scheduled visit.  She completed the most recent week of Xeloda on 05/14/2018.  She has diarrhea, chiefly in the morning, but has up to 3 diarrhea stools each day.  She is taking Imodium.  She has increased erythema and discomfort at the hands and feet.  She reports 2 episodes of nausea and vomiting over the past 2 weeks.  She continues to have pain in the right upper abdomen.  Tramadol does not relieve the pain.  Objective:  Vital signs in last 24 hours:  Blood pressure 130/61, pulse 66, temperature 98.4 F (36.9 C), temperature source Oral, resp. rate 18, height _0  (1.575 m), weight 144 lb 6.4 oz (65.5 kg), SpO2 96 %.    HEENT: No thrush or ulcers Resp: Lungs clear bilaterally Cardio: Regular rate and rhythm GI: No hepatomegaly, tender in the right upper abdomen, no mass Vascular: No leg edema  Skin: Erythema and dry desquamation over the palms, erythema at the soles, erythematous rash over the forearms and upper  anterior chest    Lab Results:  Lab Results  Component Value Date   WBC 7.3 05/15/2018   HGB 10.9 (L) 05/15/2018   HCT 32.8 (L) 05/15/2018   MCV 103.8 (H) 05/15/2018   PLT 321 05/15/2018   NEUTROABS 4.8 05/15/2018    CMP  Lab Results  Component Value Date   NA 141 04/21/2018   K 3.4 (L) 04/21/2018   CL 103 04/21/2018   CO2 27 04/21/2018   GLUCOSE 99 04/21/2018   BUN 20 04/21/2018   CREATININE 1.08 (H) 04/21/2018   CALCIUM 8.7 (L) 04/21/2018   PROT 7.0 04/21/2018   ALBUMIN 3.7 04/21/2018   AST 25 04/21/2018   ALT 19 04/21/2018   ALKPHOS 72 04/21/2018   BILITOT 0.5 04/21/2018   GFRNONAA 46 (L) 04/21/2018   GFRAA 53 (L) 04/21/2018    Lab Results  Component Value Date   CEA1 1.96 03/21/2018    Lab Results  Component Value Date   INR 0.95 09/22/2017    Imaging:  No results found.  Medications: I have  reviewed the patient's current medications.   Assessment/Plan: 1. Anterior liver mass, ultrasound-guided biopsy 09/22/2017-adenocarcinoma, MSI-stable, tumor mutation burden 0, IDH1 R132G, BAP1 loss ? CT chest 09/07/2017, 4.1 cm hypodense anterior liver mass, changes of chronic hypersensitivity pneumonitis ? MRI abdomen 09/13/2017-isolated liver mass measuring 4.7 x 3.8 cm involving segments 4B and 5, no additional liver masses ? SBRT to the liver mass beginning 10/31/2017, completed 11/10/2017 ? CT abdomen/pelvis 01/23/2018- radiation changes in the liver, liver mass measuring 5.9 x 4.4 x 5.0 cm but no evidence of metastatic disease in the abdomen or pelvis ? MRI abdomen 02/22/2018- dominant central left liver lesion slightly increased in size with decreased central enhancement suggesting necrosis, 2 new lesions including an 11 mm left hepatic lesion and a 9 mm right hepatic lesion ? Xeloda 7 days on/7 days off beginning 03/27/2018 (1500 mg every morning and 1000 mg every afternoon) ? Xeloda dose reduced to 1000 mg every morning and 1000 mg every afternoon beginning 04/24/2018 due to diarrhea ? Xeloda discontinued after an office visit 05/15/2018 secondary to hand/foot syndrome and diarrhea 2. Abdominal pain 3. History of diastolic heart failure 4. Hypertension 5. G3, P3 6. History of anemia   Disposition: [name] has been maintained on Xeloda for the past 2 months.  She has  significant diarrhea and hand/foot pain despite dose reduction of Xeloda.  She has persistent abdominal pain, likely secondary to tumor involving the liver.  She will discontinue Xeloda.  She will begin a potassium supplement.  I discussed treatment options with [name] and her husband.  We discussed supportive/comfort care versus considering additional chemotherapy.  She would like to consider further treatment.  She will begin a trial of low-dose hydromorphone for pain (she reports an allergy to oxycodone).  [name]  will return for an office visit and further discussion on 05/26/2018.  Betsy Coder, MD  05/15/2018  11:28 AM

## 2018-05-17 ENCOUNTER — Other Ambulatory Visit: Payer: Self-pay | Admitting: *Deleted

## 2018-05-17 ENCOUNTER — Telehealth: Payer: Self-pay | Admitting: *Deleted

## 2018-05-17 DIAGNOSIS — C801 Malignant (primary) neoplasm, unspecified: Secondary | ICD-10-CM

## 2018-05-17 MED ORDER — PROCHLORPERAZINE MALEATE 5 MG PO TABS: 5.0000 mg | ORAL_TABLET | Freq: Four times a day (QID) | ORAL | 1 refills | Status: DC | PRN

## 2018-05-17 NOTE — Telephone Encounter (Signed)
Called to request med for nausea (has no more compazine). No vomiting, just nausea. Confirmed she is not taking the Xeloda any longer per MD. Also having diarrhea (x2 day). Is only taking #4 Imodium/day. Instructed her to increase to #2 tabs qid. Push po fluids. Will call her tomorrow to follow up.

## 2018-05-23 ENCOUNTER — Telehealth: Payer: Self-pay | Admitting: Oncology

## 2018-05-23 ENCOUNTER — Inpatient Hospital Stay: Payer: Medicare Other | Admitting: Oncology

## 2018-05-23 ENCOUNTER — Other Ambulatory Visit: Payer: Self-pay

## 2018-05-23 ENCOUNTER — Telehealth: Payer: Self-pay | Admitting: *Deleted

## 2018-05-23 ENCOUNTER — Other Ambulatory Visit: Payer: Self-pay | Admitting: Oncology

## 2018-05-23 VITALS — BP 112/57 | HR 75 | Temp 98.6°F | Resp 17 | Ht 62.0 in | Wt 144.0 lb

## 2018-05-23 DIAGNOSIS — C787 Secondary malignant neoplasm of liver and intrahepatic bile duct: Secondary | ICD-10-CM

## 2018-05-23 DIAGNOSIS — C801 Malignant (primary) neoplasm, unspecified: Secondary | ICD-10-CM

## 2018-05-23 DIAGNOSIS — I10 Essential (primary) hypertension: Secondary | ICD-10-CM | POA: Diagnosis not present

## 2018-05-23 DIAGNOSIS — D649 Anemia, unspecified: Secondary | ICD-10-CM | POA: Diagnosis not present

## 2018-05-23 DIAGNOSIS — I5032 Chronic diastolic (congestive) heart failure: Secondary | ICD-10-CM

## 2018-05-23 DIAGNOSIS — Z79899 Other long term (current) drug therapy: Secondary | ICD-10-CM

## 2018-05-23 DIAGNOSIS — C221 Intrahepatic bile duct carcinoma: Secondary | ICD-10-CM

## 2018-05-23 MED ORDER — CEPHALEXIN 500 MG PO CAPS: 500.0000 mg | ORAL_CAPSULE | Freq: Three times a day (TID) | ORAL | 0 refills | Status: DC

## 2018-05-23 NOTE — Progress Notes (Signed)
Summer Nielsen   Diagnosis: Cholangiocarcinoma  INTERVAL HISTORY:   Summer Nielsen returns prior to a scheduled visit.  She developed swelling and pain of the right foot yesterday.  She had a fever of 102 degrees.  She was evaluated by her primary care provider and placed on clindamycin.  She has taken 2 doses of clindamycin.  The foot remains swollen and painful.  No leg swelling. Summer Nielsen reports improvement in diarrhea, but she continues to have approximately 2 loose stools each morning.  The skin breakdown at the hands and feet persist.  No abdominal pain at present.  Objective:  Vital signs in last 24 hours:  Blood pressure (!) 112/57, pulse 75, temperature 98.6 F (37 C), temperature source Oral, resp. rate 17, height _0  (1.575 m), weight 144 lb (65.3 kg), SpO2 95 %.    Resp: Lungs clear bilaterally Cardio: Regular rate and rhythm GI: No hepatomegaly, nontender Vascular: Trace edema at the ankle bilaterally.  No upper leg edema or palpable cord.  No tenderness at the legs.  There is edema of the dorsum of the right foot.  Skin: Dry desquamation at the palms with mild erythema.  Skin thickening and callus formation at the soles.  Erythema at the dorsum of the right foot.    Lab Results:  Lab Results  Component Value Date   WBC 7.3 05/15/2018   HGB 10.9 (L) 05/15/2018   HCT 32.8 (L) 05/15/2018   MCV 103.8 (H) 05/15/2018   PLT 321 05/15/2018   NEUTROABS 4.8 05/15/2018    CMP  Lab Results  Component Value Date   NA 140 05/15/2018   K 2.8 (L) 05/15/2018   CL 101 05/15/2018   CO2 29 05/15/2018   GLUCOSE 115 (H) 05/15/2018   BUN 17 05/15/2018   CREATININE 1.11 (H) 05/15/2018   CALCIUM 8.4 (L) 05/15/2018   PROT 6.1 (L) 05/15/2018   ALBUMIN 3.3 (L) 05/15/2018   AST 25 05/15/2018   ALT 22 05/15/2018   ALKPHOS 65 05/15/2018   BILITOT 0.8 05/15/2018   GFRNONAA 44 (L) 05/15/2018   GFRAA 51 (L) 05/15/2018     Medications: I have  reviewed the patient's current medications.   Assessment/Plan: 1. Anterior liver mass, ultrasound-guided biopsy 09/22/2017-adenocarcinoma, MSI-stable, tumor mutation burden 0, IDH1 R132G, BAP1 loss ? CT chest 09/07/2017, 4.1 cm hypodense anterior liver mass, changes of chronic hypersensitivity pneumonitis ? MRI abdomen 09/13/2017-isolated liver mass measuring 4.7 x 3.8 cm involving segments 4B and 5, no additional liver masses ? SBRT to the liver mass beginning 10/31/2017, completed 11/10/2017 ? CT abdomen/pelvis 01/23/2018- radiation changes in the liver, liver mass measuring 5.9 x 4.4 x 5.0 cm but no evidence of metastatic disease in the abdomen or pelvis ? MRI abdomen 02/22/2018- dominant central left liver lesion slightly increased in size with decreased central enhancement suggesting necrosis, 2 new lesions including an 11 mm left hepatic lesion and a 9 mm right hepatic lesion ? Xeloda 7 days on/7 days off beginning 03/27/2018 (1500 mg every morning and 1000 mg every afternoon) ? Xeloda dose reduced to 1000 mg every morning and 1000 mg every afternoon beginning 04/24/2018 due to diarrhea ? Xeloda discontinued after an office visit 05/15/2018 secondary to hand/foot syndrome and diarrhea 2. Abdominal pain 3. History of diastolic heart failure 4. Hypertension 5. G3, P3 6. History of anemia 7. 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  She developed hand/foot syndrome and diarrhea while on a trial of Xeloda.  Xeloda has been placed on hold.  The symptoms are partially improved.  She states that she would like to proceed with additional systemic therapy.  We discussed FOLFOX and gemcitabine/Abraxane today.  We also discussed the need for Port-A-Cath placement.  She appears to have a cellulitis of the right foot today.  I have a low clinical suspicion for a deep vein  thrombosis.  I changed her antibiotic to Keflex secondary to the risk of increased diarrhea and C. difficile with clindamycin.  We will postpone the next scheduled office visit until 06/01/2018.  She will contact us for leg swelling or pain.  We will discuss comfort care versus a trial of salvage chemotherapy when she is here next week.  25 minutes were spent with the patient today.  The majority of the time was used for counseling and coordination of care.  Betsy Coder, MD  05/23/2018  12:28 PM

## 2018-05-23 NOTE — Telephone Encounter (Addendum)
Dr. Benay Spice was notified that patient saw her PCP yesterday with fever and swollen foot. Per Dr. Benay Spice: Called and left VM requesting return call with her status and to come in today to be seen by Dr. Benay Spice. Return call from patient. She agrees to come in at 12:00 today. Reports her fever is gone and she has not had any respiratory symptoms.

## 2018-05-23 NOTE — Telephone Encounter (Signed)
Scheduled appts for per 03/17 los. Printed avs and calender.

## 2018-05-25 ENCOUNTER — Other Ambulatory Visit: Payer: Medicare Other

## 2018-05-25 ENCOUNTER — Telehealth: Payer: Self-pay | Admitting: Oncology

## 2018-05-25 ENCOUNTER — Ambulatory Visit: Payer: Medicare Other | Admitting: Oncology

## 2018-05-25 NOTE — Telephone Encounter (Signed)
Called patient per 3/18 VM scheduling log.   Patient wanted to reschedule her lab and f/u, it was okay per MD.  Patient aware of her new appt date and time.

## 2018-05-26 ENCOUNTER — Inpatient Hospital Stay: Payer: Medicare Other

## 2018-05-26 ENCOUNTER — Inpatient Hospital Stay: Payer: Medicare Other | Admitting: Nurse Practitioner

## 2018-06-01 ENCOUNTER — Other Ambulatory Visit: Payer: Medicare Other

## 2018-06-01 ENCOUNTER — Ambulatory Visit: Payer: Medicare Other | Admitting: Nurse Practitioner

## 2018-06-02 ENCOUNTER — Inpatient Hospital Stay: Payer: Medicare Other

## 2018-06-02 ENCOUNTER — Inpatient Hospital Stay (HOSPITAL_BASED_OUTPATIENT_CLINIC_OR_DEPARTMENT_OTHER): Payer: Medicare Other | Admitting: Oncology

## 2018-06-02 DIAGNOSIS — C801 Malignant (primary) neoplasm, unspecified: Secondary | ICD-10-CM

## 2018-06-02 DIAGNOSIS — C787 Secondary malignant neoplasm of liver and intrahepatic bile duct: Secondary | ICD-10-CM | POA: Diagnosis not present

## 2018-06-02 DIAGNOSIS — C221 Intrahepatic bile duct carcinoma: Secondary | ICD-10-CM

## 2018-06-02 NOTE — Progress Notes (Signed)
[place] OFFICE PROGRESS NOTE   Diagnosis: Cholangiocarcinoma  This was a telephone visit with [name].  I spoke to [name] from my office beginning at 12:42 PM on 06/02/2018.  She identified herself by name and date of birth. INTERVAL HISTORY:   [name] reports the right foot swelling and pain have almost completely resolved.  She has mild abdominal discomfort relieved with Tylenol.  Good appetite.  Diarrhea has improved.  She continues to have one episode of diarrhea every other day.  The hand and foot skin changes are improving.  Objective:  Vital signs in last 24 hours:  Physical examination: Not performed today  Lab Results:  Lab Results  Component Value Date   WBC 7.3 05/15/2018   HGB 10.9 (L) 05/15/2018   HCT 32.8 (L) 05/15/2018   MCV 103.8 (H) 05/15/2018   PLT 321 05/15/2018   NEUTROABS 4.8 05/15/2018    CMP  Lab Results  Component Value Date   NA 140 05/15/2018   K 2.8 (L) 05/15/2018   CL 101 05/15/2018   CO2 29 05/15/2018   GLUCOSE 115 (H) 05/15/2018   BUN 17 05/15/2018   CREATININE 1.11 (H) 05/15/2018   CALCIUM 8.4 (L) 05/15/2018   PROT 6.1 (L) 05/15/2018   ALBUMIN 3.3 (L) 05/15/2018   AST 25 05/15/2018   ALT 22 05/15/2018   ALKPHOS 65 05/15/2018   BILITOT 0.8 05/15/2018   GFRNONAA 44 (L) 05/15/2018   GFRAA 51 (L) 05/15/2018    Lab Results  Component Value Date   CEA1 1.96 03/21/2018    Lab Results  Component Value Date   INR 0.95 09/22/2017    Imaging:  No results found.  Medications: I have reviewed the patient's current medications.   Assessment/Plan: 1. Anterior liver mass, ultrasound-guided biopsy 09/22/2017-adenocarcinoma, MSI-stable, tumor mutation burden 0, IDH1 R132G, BAP1 loss ? CT chest 09/07/2017, 4.1 cm hypodense anterior liver mass, changes of chronic hypersensitivity pneumonitis ? MRI abdomen 09/13/2017-isolated liver mass measuring 4.7 x 3.8 cm involving segments 4B and 5, no additional liver  masses ? SBRT to the liver mass beginning 10/31/2017, completed 11/10/2017 ? CT abdomen/pelvis 01/23/2018- radiation changes in the liver, liver mass measuring 5.9 x 4.4 x 5.0 cm but no evidence of metastatic disease in the abdomen or pelvis ? MRI abdomen 02/22/2018- dominant central left liver lesion slightly increased in size with decreased central enhancement suggesting necrosis, 2 new lesions including an 11 mm left hepatic lesion and a 9 mm right hepatic lesion ? Xeloda 7 days on/7 days off beginning 03/27/2018 (1500 mg every morning and 1000 mg every afternoon) ? Xeloda dose reduced to 1000 mg every morning and 1000 mg every afternoon beginning 04/24/2018 due to diarrhea ? Xeloda discontinued after an office visit 05/15/2018 secondary to hand/foot syndrome and diarrhea 2. Abdominal pain 3. History of diastolic heart failure 4. Hypertension 5. G3, P3 6. History of anemia 7. Right foot cellulitis 05/22/2018-Keflex prescribed 05/23/2018   Disposition: [name] has adenocarcinoma involving the liver, likely intrahepatic cholangiocarcinoma.  She was unable to tolerate capecitabine.  I discussed treatment options with [name].  We discussed comfort/supportive care versus a trial of chemotherapy.  She understands no therapy will be curative.  The goal of chemotherapy is palliative.  She would like to proceed with a trial of chemotherapy.  She will be scheduled for a lab visit and restaging CT evaluation next week.  We will complete another telephone visit on 06/13/2018.  We will decide on  a chemotherapy regimen then.  I will likely recommend Port-A-Cath placement and gemcitabine/Abraxane chemotherapy.  Greater than 10 minutes were spent on the telephone with [name] today.  A total of 15 minutes were spent in documentation and coordination of care.  Betsy Coder, MD  06/02/2018  12:35 PM

## 2018-06-05 ENCOUNTER — Telehealth: Payer: Self-pay | Admitting: *Deleted

## 2018-06-05 ENCOUNTER — Other Ambulatory Visit: Payer: Self-pay | Admitting: Nurse Practitioner

## 2018-06-05 DIAGNOSIS — C221 Intrahepatic bile duct carcinoma: Secondary | ICD-10-CM

## 2018-06-05 MED ORDER — DIPHENOXYLATE-ATROPINE 2.5-0.025 MG PO TABS: 2.0000 | ORAL_TABLET | Freq: Four times a day (QID) | ORAL | 0 refills | Status: DC | PRN

## 2018-06-05 NOTE — Telephone Encounter (Signed)
Still having diarrhea--around 3-4/day. Takes 4-6 Imodium/day. Able to drink liquids well. Still no appetite. Asking if there is anything else she can take?

## 2018-06-05 NOTE — Telephone Encounter (Signed)
Notified patient that Lomotil was sent in to her pharmacy and provided directions how to take. CT scan will be 06/12/18 at 0800/0815 and NPO after MN and drink contrast at 0615 and 0715. Instructed her to pick up her contrast tomorrow when she comes in for her labs. Confirmed her telephone visit with Dr. Benay Spice on 06/13/18.

## 2018-06-06 ENCOUNTER — Other Ambulatory Visit: Payer: Self-pay | Admitting: Oncology

## 2018-06-06 ENCOUNTER — Other Ambulatory Visit: Payer: Medicare Other

## 2018-06-06 ENCOUNTER — Telehealth: Payer: Self-pay | Admitting: *Deleted

## 2018-06-06 NOTE — Telephone Encounter (Signed)
Insurance requesting 90 day supply. Approved X 1 fill and pharmacy will cancel prior script for #60 tabs.

## 2018-06-06 NOTE — Telephone Encounter (Signed)
Insurance company is denying the Lomotil script. Was given [id number] for PA to be obtained. Forwarded this information to PA specialist, Clarise Cruz. In meantime, pharmacy will fill #30 w/discount card her cash price will be $20. Patient agrees to this until insurance approves the medication.

## 2018-06-07 ENCOUNTER — Inpatient Hospital Stay: Payer: Medicare Other | Attending: Oncology

## 2018-06-07 ENCOUNTER — Other Ambulatory Visit: Payer: Self-pay

## 2018-06-07 DIAGNOSIS — Z79899 Other long term (current) drug therapy: Secondary | ICD-10-CM | POA: Insufficient documentation

## 2018-06-07 DIAGNOSIS — I872 Venous insufficiency (chronic) (peripheral): Secondary | ICD-10-CM | POA: Insufficient documentation

## 2018-06-07 DIAGNOSIS — C801 Malignant (primary) neoplasm, unspecified: Secondary | ICD-10-CM

## 2018-06-07 DIAGNOSIS — C221 Intrahepatic bile duct carcinoma: Secondary | ICD-10-CM | POA: Insufficient documentation

## 2018-06-07 LAB — CMP (CANCER CENTER ONLY)
ALT: 21 U/L (ref 0–44)
AST: 23 U/L (ref 15–41)
Albumin: 2.6 g/dL — ABNORMAL LOW (ref 3.5–5.0)
Alkaline Phosphatase: 90 U/L (ref 38–126)
Anion gap: 11 (ref 5–15)
BUN: 16 mg/dL (ref 8–23)
CO2: 24 mmol/L (ref 22–32)
Calcium: 7.3 mg/dL — ABNORMAL LOW (ref 8.9–10.3)
Chloride: 101 mmol/L (ref 98–111)
Creatinine: 1.32 mg/dL — ABNORMAL HIGH (ref 0.44–1.00)
GFR, Est AFR Am: 41 mL/min — ABNORMAL LOW (ref >60)
GFR, Estimated: 36 mL/min — ABNORMAL LOW (ref >60)
Glucose, Bld: 127 mg/dL — ABNORMAL HIGH (ref 70–99)
Potassium: 3.5 mmol/L (ref 3.5–5.1)
Sodium: 136 mmol/L (ref 135–145)
Total Bilirubin: 0.8 mg/dL (ref 0.3–1.2)
Total Protein: 6.6 g/dL (ref 6.5–8.1)

## 2018-06-08 ENCOUNTER — Other Ambulatory Visit: Payer: Self-pay | Admitting: Nurse Practitioner

## 2018-06-08 ENCOUNTER — Telehealth: Payer: Self-pay | Admitting: *Deleted

## 2018-06-08 DIAGNOSIS — C221 Intrahepatic bile duct carcinoma: Secondary | ICD-10-CM

## 2018-06-08 LAB — CANCER ANTIGEN 19-9: CA 19-9: 31 U/mL (ref 0–35)

## 2018-06-08 NOTE — Telephone Encounter (Signed)
Called patient with CMP results: creatinine is elevated. Could be related to mild dehydration from her earlier diarrhea. Encouraged her to push fluids and will recheck at 0800 on day of CT scan. Explained this is important information for the radiologist in regards to how much IV contrast she will receive. She reports diarrhea is better. Only had one loose stool today. Also informed that her tumor marker is lower.

## 2018-06-12 ENCOUNTER — Ambulatory Visit (HOSPITAL_COMMUNITY)
Admission: RE | Admit: 2018-06-12 | Discharge: 2018-06-12 | Disposition: A | Discharge status: Home / Self Care | Payer: Medicare Other | Source: Ambulatory Visit | Attending: Nurse Practitioner | Admitting: Nurse Practitioner

## 2018-06-12 ENCOUNTER — Other Ambulatory Visit: Payer: Self-pay

## 2018-06-12 ENCOUNTER — Encounter (HOSPITAL_COMMUNITY): Payer: Self-pay

## 2018-06-12 ENCOUNTER — Inpatient Hospital Stay: Payer: Medicare Other

## 2018-06-12 DIAGNOSIS — C221 Intrahepatic bile duct carcinoma: Secondary | ICD-10-CM | POA: Diagnosis not present

## 2018-06-12 HISTORY — DX: Intrahepatic bile duct carcinoma: C22.1

## 2018-06-12 LAB — BASIC METABOLIC PANEL - CANCER CENTER ONLY
Anion gap: 11 (ref 5–15)
BUN: 21 mg/dL (ref 8–23)
CO2: 23 mmol/L (ref 22–32)
Calcium: 7.1 mg/dL — ABNORMAL LOW (ref 8.9–10.3)
Chloride: 102 mmol/L (ref 98–111)
Creatinine: 1.3 mg/dL — ABNORMAL HIGH (ref 0.44–1.00)
GFR, Est AFR Am: 42 mL/min — ABNORMAL LOW (ref >60)
GFR, Estimated: 36 mL/min — ABNORMAL LOW (ref >60)
Glucose, Bld: 106 mg/dL — ABNORMAL HIGH (ref 70–99)
Potassium: 4 mmol/L (ref 3.5–5.1)
Sodium: 136 mmol/L (ref 135–145)

## 2018-06-12 MED ORDER — IOHEXOL 300 MG/ML  SOLN
100.0000 mL | Freq: Once | INTRAMUSCULAR | Status: AC | PRN
  Administered 2018-06-12: 100 mL via INTRAVENOUS

## 2018-06-12 MED ORDER — SODIUM CHLORIDE (PF) 0.9 % IJ SOLN
INTRAMUSCULAR | Status: AC
  Filled 2018-06-12: qty 50

## 2018-06-13 ENCOUNTER — Inpatient Hospital Stay (HOSPITAL_BASED_OUTPATIENT_CLINIC_OR_DEPARTMENT_OTHER): Payer: Medicare Other | Admitting: Oncology

## 2018-06-13 ENCOUNTER — Telehealth: Payer: Self-pay | Admitting: *Deleted

## 2018-06-13 ENCOUNTER — Telehealth: Payer: Self-pay | Admitting: Oncology

## 2018-06-13 DIAGNOSIS — Z79899 Other long term (current) drug therapy: Secondary | ICD-10-CM

## 2018-06-13 DIAGNOSIS — C221 Intrahepatic bile duct carcinoma: Secondary | ICD-10-CM

## 2018-06-13 DIAGNOSIS — R971 Elevated cancer antigen 125 [CA 125]: Secondary | ICD-10-CM | POA: Diagnosis not present

## 2018-06-13 DIAGNOSIS — G893 Neoplasm related pain (acute) (chronic): Secondary | ICD-10-CM | POA: Diagnosis not present

## 2018-06-13 NOTE — Telephone Encounter (Signed)
Appt was scheduled per 4/7 sch message.  Patient did not answer the phone, left a VM regarding the appt time and date.

## 2018-06-13 NOTE — Telephone Encounter (Signed)
Called patient to arrange for pick of kit for C. Diff testing by her son. She reports most likely she will come to pick up. Will let front desk know she is here and nurse will bring kit up to her. She was instructed by MD to hold her Lasix and reduce KCl to daily dosing.

## 2018-06-13 NOTE — Progress Notes (Signed)
[facility] [place] OFFICE VISIT PROGRESS NOTE  I connected with Summer Nielsen on 06/13/18 at 11:20 by telephone and verified that I am speaking with the correct person using two identifiers.     Patient's location: Home Provider's location: Office  Chief Complaint: Diarrhea  Diagnosis: Adenocarcinoma  INTERVAL HISTORY:   Summer Nielsen reports improvement in diarrhea last month, but the diarrhea has progressed over the past week.  She has multiple loose stools per day.  She had increased diarrhea after drinking the CT contrast yesterday.  Abdominal pain and nausea have improved.  She reports decreased volume of food intake.    Lab Results:  Lab Results  Component Value Date   WBC 7.3 05/15/2018   HGB 10.9 (L) 05/15/2018   HCT 32.8 (L) 05/15/2018   MCV 103.8 (H) 05/15/2018   PLT 321 05/15/2018   NEUTROABS 4.8 05/15/2018    Imaging:  Ct Abdomen Pelvis W Contrast  Result Date: 06/12/2018 CLINICAL DATA:  Follow-up intrahepatic cholangiocarcinoma. Ongoing chemotherapy. Restaging. EXAM: CT ABDOMEN AND PELVIS WITH CONTRAST TECHNIQUE: Multidetector CT imaging of the abdomen and pelvis was performed using the standard protocol following bolus administration of intravenous contrast. CONTRAST:  133m OMNIPAQUE IOHEXOL 300 MG/ML  SOLN COMPARISON:  MRI on 02/22/2018 FINDINGS: Lower Chest: No acute findings. Hepatobiliary: Hypovascular mass in segment 4A/B currently measures 3.8 x 2.9 cm, compared to 4.8 x 4.1 cm previously. A few other subtle hypovascular lesions are seen in the superior right hepatic lobe which were likely present on previous study in retrospect and show no significant change. No definite new or enlarging liver masses identified. Prior cholecystectomy again seen. Diffuse biliary ductal dilatation shows no significant change. Pancreas:  No mass or inflammatory changes. Spleen: Within normal limits in size and appearance.  Adrenals/Urinary Tract: No masses identified. Stable small bilateral renal cysts. Diffuse left renal parenchymal atrophy is also unchanged. No evidence of hydronephrosis. Unremarkable unopacified urinary bladder. Stomach/Bowel: Mild-to-moderate diffuse colonic wall thickening is seen, consistent with colitis. No evidence of small bowel involvement. No evidence of bowel obstruction or abscess. Vascular/Lymphatic: No pathologically enlarged lymph nodes. No abdominal aortic aneurysm. Aortic atherosclerosis. Reproductive: Prior hysterectomy noted. Adnexal regions are unremarkable in appearance. Other: Significant beam hardening artifact through the lower pelvis is seen due to left hip prosthesis and internal fixation hardware. Musculoskeletal: No suspicious bone lesions identified. Old compression fractures of the T12 and L4 vertebral bodies are again noted. Prior PLIF at L5-S1. IMPRESSION: 1. Mild to moderate diffuse colitis, likely infectious in etiology. No evidence of abscess or other complication. 2. Mild decrease in size of dominant hypovascular mass in left hepatic lobe. Other smaller subtle hypovascular lesions in the superior right hepatic lobe show no definite change. 3. Stable diffuse biliary ductal dilatation status post cholecystectomy. Electronically Signed   By: JEarle GellM.D.   On: 06/12/2018 13:12    Medications: I have reviewed the patient's current medications.  Assessment/Plan: 1. Anterior liver mass, ultrasound-guided biopsy 09/22/2017-adenocarcinoma, MSI-stable, tumor mutation burden 0,IDH1 R132G, BAP1 loss ? CT chest 09/07/2017, 4.1 cm hypodense anterior liver mass, changes of chronic hypersensitivity pneumonitis ? MRI abdomen 09/13/2017-isolated liver mass measuring 4.7 x 3.8 cm involving segments 4B and 5, no additional liver masses ? SBRT to the liver mass beginning 10/31/2017, completed 11/10/2017 ? CT abdomen/pelvis 01/23/2018-radiation changes in the liver, liver mass measuring 5.9 x  4.4 x 5.0 cm but no evidence of metastatic disease in the abdomen or pelvis ? MRI abdomen 02/22/2018-dominant  central left liver lesion slightly increased in size with decreased central enhancement suggesting necrosis, 2 new lesions including an 11 mm left hepatic lesion and a 9 mm right hepatic lesion ? Xeloda 7 days on/7 days off beginning 03/27/2018 (1500 mg every morning and 1000 mg every afternoon) ? Xeloda dose reduced to 1000 mg every morning and 1000 mg every afternoon beginning 04/24/2018 due to diarrhea ? Xeloda discontinued after an office visit 05/15/2018 secondary to hand/foot syndrome and diarrhea ? CT abdomen/pelvis 06/12/2018- decrease in size of dominant left hepatic mass, no significant change in smaller liver lesions, stable biliary ductal dilatation, diffuse "colitis " 2. Abdominal pain 3. History of diastolic heart failure 4. Hypertension 5. G3, P3 6. History of anemia 7. Right foot cellulitis 05/22/2018-Keflex prescribed 05/23/2018 8. 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  The restaging CT reveals a decrease in the dominant hepatic mass and no significant change in smaller lesions.  No other evidence of disease progression.  We discussed treatment options including FOLFOX and gemcitabine/Abraxane.  It is unclear whether the dominant liver lesion has decreased secondary to radiation or Xeloda.  She is unable to tolerate further Xeloda.  Summer Nielsen is symptomatic with persistent diarrhea.  This may be related to prolonged toxicity from Xeloda.  We will asked her to return a stool sample for the C. difficile toxin today or tomorrow.  Summer Nielsen will hold Lasix while having diarrhea.  She will decrease the potassium chloride to once daily.  She will be scheduled for a phone visit next week.  I provided 25 minutes of phone time during this  encounter, and > 50% was spent counseling as documented under my assessment & plan.  I also reviewed CT and MRI images.  Betsy Coder ANP/GNP-BC   06/13/2018 11:20 AM

## 2018-06-14 ENCOUNTER — Telehealth: Payer: Self-pay | Admitting: *Deleted

## 2018-06-14 ENCOUNTER — Other Ambulatory Visit: Payer: Self-pay | Admitting: Nurse Practitioner

## 2018-06-14 ENCOUNTER — Other Ambulatory Visit: Payer: Self-pay

## 2018-06-14 DIAGNOSIS — C221 Intrahepatic bile duct carcinoma: Secondary | ICD-10-CM

## 2018-06-14 DIAGNOSIS — A0472 Enterocolitis due to Clostridium difficile, not specified as recurrent: Secondary | ICD-10-CM

## 2018-06-14 LAB — C DIFFICILE QUICK SCREEN W PCR REFLEX
C Diff antigen: POSITIVE — AB
C Diff interpretation: DETECTED
C Diff toxin: POSITIVE — AB

## 2018-06-14 MED ORDER — VANCOMYCIN HCL 125 MG PO CAPS: 125.0000 mg | ORAL_CAPSULE | Freq: Four times a day (QID) | ORAL | 0 refills | Status: DC

## 2018-06-14 NOTE — Progress Notes (Unsigned)
POSITIVE C.DIFF RESULTS GIVEN TO SUSAN COWARD AT 11:44.RB

## 2018-06-14 NOTE — Telephone Encounter (Signed)
Notified patient that she did test positive for C. Diff toxin/antigen and she needs to start Vancomycin qid X 10 days and script has already been sent. Instructed her to continue to hold the Lomotil and have her husband or any visitors use a separate bathroom than her. Suggested she use good handwashing and wash her clothes separate from her husband and not to do meal preparation for family at this time. Call back for any worsening of her symptoms or lack of response to antibiotic.

## 2018-06-19 ENCOUNTER — Inpatient Hospital Stay (HOSPITAL_BASED_OUTPATIENT_CLINIC_OR_DEPARTMENT_OTHER): Payer: Medicare Other | Admitting: Oncology

## 2018-06-19 ENCOUNTER — Telehealth: Payer: Self-pay | Admitting: Oncology

## 2018-06-19 DIAGNOSIS — R600 Localized edema: Secondary | ICD-10-CM

## 2018-06-19 DIAGNOSIS — R197 Diarrhea, unspecified: Secondary | ICD-10-CM | POA: Diagnosis not present

## 2018-06-19 DIAGNOSIS — Z9221 Personal history of antineoplastic chemotherapy: Secondary | ICD-10-CM

## 2018-06-19 DIAGNOSIS — C221 Intrahepatic bile duct carcinoma: Secondary | ICD-10-CM

## 2018-06-19 NOTE — Progress Notes (Signed)
[facility] OFFICE VISIT PROGRESS NOTE  I connected with@ on 06/19/18 at 11:20 AM EDT by telephone and verified that I am speaking with the correct person using two identifiers.     Patient's location: Home Provider's location: Office  Chief Complaint: Diarrhea  Diagnosis: Intrahepatic cholangiocarcinoma  INTERVAL HISTORY:   Ms. Dimercurio was diagnosed with C. difficile colitis on 06/14/2018.  She began treatment with oral vancomycin.  She has noted improvement in the diarrhea.  She first noted improvement on 06/17/2018.  She is now having approximately 3 loose stools per day.  No nausea or pain.  She has noted increased bilateral leg swelling since discontinuing Lasix last week.  She reports "red splotches "at the ankle bilaterally.  Good appetite.  Lab Results:  Lab Results  Component Value Date   WBC 7.3 05/15/2018   HGB 10.9 (L) 05/15/2018   HCT 32.8 (L) 05/15/2018   MCV 103.8 (H) 05/15/2018   PLT 321 05/15/2018   NEUTROABS 4.8 05/15/2018    Imaging:  No results found.  Medications: I have reviewed the patient's current medications.  Assessment/Plan: 1. Anterior liver mass, ultrasound-guided biopsy 09/22/2017-adenocarcinoma, MSI-stable, tumor mutation burden 0,IDH1 R132G, BAP1 loss ? CT chest 09/07/2017, 4.1 cm hypodense anterior liver mass, changes of chronic hypersensitivity pneumonitis ? MRI abdomen 09/13/2017-isolated liver mass measuring 4.7 x 3.8 cm involving segments 4B and 5, no additional liver masses ? SBRT to the liver mass beginning 10/31/2017, completed 11/10/2017 ? CT abdomen/pelvis 01/23/2018-radiation changes in the liver, liver mass measuring 5.9 x 4.4 x 5.0 cm but no evidence of metastatic disease in the abdomen or pelvis ? MRI abdomen 02/22/2018-dominant central left liver lesion slightly increased in size with decreased central enhancement suggesting necrosis, 2 new lesions including an 11 mm left hepatic  lesion and a 9 mm right hepatic lesion ? Xeloda 7 days on/7 days off beginning 03/27/2018 (1500 mg every morning and 1000 mg every afternoon) ? Xeloda dose reduced to 1000 mg every morning and 1000 mg every afternoon beginning 04/24/2018 due to diarrhea ? Xeloda discontinued after an office visit 05/15/2018 secondary to hand/foot syndrome and diarrhea ? CT abdomen/pelvis 06/12/2018- decrease in size of dominant left hepatic mass, no significant change in smaller liver lesions, stable biliary ductal dilatation, diffuse "colitis " 2. Abdominal pain 3. History of diastolic heart failure 4. Hypertension 5. G3, P3 6. History of anemia 7. Right foot cellulitis 05/22/2018-Keflex prescribed 05/23/2018 8. C. difficile colitis 06/14/2018-treated with vancomycin     Disposition: Ms. Brozowski has intrahepatic cholangiocarcinoma.  She was treated with capecitabine beginning 03/27/2018 with poor tolerance. She had persistent diarrhea after discontinuing capecitabine last month.  She was diagnosed with C. difficile colitis on 06/14/2018.  The diarrhea is improving with oral vancomycin.  She will complete the 10-day course of vancomycin.  Ms. Gorman will contact us if the diarrhea has not resolved at the completion this therapy.  The restaging CT last week revealed improvement in the dominant liver mass.  I discussed treatment options with Ms. Freedman.  I recommend placing systemic therapy on hold due to the colitis and current Covid pandemic.  She will be scheduled for an office visit in approximately 5 weeks.  She will contact us if the diarrhea does not resolve or for new symptoms.  I discussed the assessment and treatment plan with the patient. The patient was provided an opportunity to ask questions and all were answered. The patient agreed with the plan and demonstrated  an understanding of the instructions.   The patient was advised to call back or seek an in-person evaluation if the symptoms worsen or if the  condition fails to improve as anticipated.  I provided 20  minutes of telephone and documentation time during this encounter, and > 50% was spent counseling as documented under my assessment & plan.  Betsy Coder ANP/GNP-BC   06/19/2018 11:18 AM

## 2018-06-19 NOTE — Telephone Encounter (Signed)
Scheduled appt per 4/13 los.

## 2018-06-21 ENCOUNTER — Telehealth: Payer: Self-pay | Admitting: *Deleted

## 2018-06-21 NOTE — Telephone Encounter (Addendum)
Called to report since speaking with MD on Monday, she has worse swelling in BLE up to calves and it is pitting. She is back on her lasix as suggested. Has also developed flat quarter sized "splotches" over her legs that sting (do not itch). Still has about 5 days left on vancomycin. Per Dr. Benay Spice: Most likely venous stasis changes. Elevate legs and continue bid Lasix. Can see her in office tomorrow if she would prefer. Patient will try elevation overnight and call in morning if it is not better to be seen.

## 2018-06-22 ENCOUNTER — Encounter (INDEPENDENT_AMBULATORY_CARE_PROVIDER_SITE_OTHER): Payer: Self-pay | Admitting: Specialist

## 2018-06-22 ENCOUNTER — Ambulatory Visit (INDEPENDENT_AMBULATORY_CARE_PROVIDER_SITE_OTHER): Payer: Medicare Other | Admitting: Specialist

## 2018-06-22 ENCOUNTER — Other Ambulatory Visit: Payer: Self-pay

## 2018-06-22 ENCOUNTER — Inpatient Hospital Stay (HOSPITAL_BASED_OUTPATIENT_CLINIC_OR_DEPARTMENT_OTHER): Payer: Medicare Other | Admitting: Nurse Practitioner

## 2018-06-22 ENCOUNTER — Encounter: Payer: Self-pay | Admitting: Nurse Practitioner

## 2018-06-22 VITALS — BP 132/57 | HR 73 | Ht 62.0 in | Wt 144.0 lb

## 2018-06-22 VITALS — BP 144/71 | HR 83 | Temp 98.2°F | Resp 20 | Ht 62.0 in | Wt 144.4 lb

## 2018-06-22 DIAGNOSIS — C221 Intrahepatic bile duct carcinoma: Secondary | ICD-10-CM | POA: Diagnosis not present

## 2018-06-22 DIAGNOSIS — I872 Venous insufficiency (chronic) (peripheral): Secondary | ICD-10-CM | POA: Diagnosis not present

## 2018-06-22 DIAGNOSIS — Z79899 Other long term (current) drug therapy: Secondary | ICD-10-CM

## 2018-06-22 DIAGNOSIS — Z981 Arthrodesis status: Secondary | ICD-10-CM

## 2018-06-22 DIAGNOSIS — S22000S Wedge compression fracture of unspecified thoracic vertebra, sequela: Secondary | ICD-10-CM

## 2018-06-22 NOTE — Progress Notes (Signed)
85 year old white female returns for follow-up visit for thoracic spine compression fracture.  States that she is doing very well.  Not having any problems.  No complaints of pain.  Patient has been followed by oncology for adenocarcinoma.   Exam Extremely pleasant elderly white female alert and oriented in no acute distress.  Nontender over the thoracic and lumbar spine.  Negative log about his.  Neck straight leg raise.  No focal motor deficits.   Plan Since patient is not have any issues, no further imaging indicated at this point of her thoracic or lumbar spine.  Follow-up in 6 months for recheck.  Return sooner if needed.

## 2018-06-22 NOTE — Progress Notes (Addendum)
[facility] OFFICE PROGRESS NOTE   Diagnosis: Intrahepatic cholangiocarcinoma  INTERVAL HISTORY:   Ms. Cadena returns prior to scheduled follow-up for evaluation of leg swelling and skin redness.  She initially noted small areas of scattered redness at the lower legs about a week ago.  These areas have progressed now with associated itching/burning.  She resumed Lasix 4 days ago due to increased leg swelling.  She has noted no significant improvement in the edema as of yet.  She does note increased urination.  Legs feel "tight".  She denies fever, cough, shortness of breath.  Diarrhea has resolved.  She continues vancomycin.  Objective:  Vital signs in last 24 hours:  Blood pressure (!) 144/71, pulse 83, temperature 98.2 F (36.8 C), temperature source Oral, resp. rate 20, height _0  (1.575 m), weight 144 lb 6.4 oz (65.5 kg), SpO2 95 %.    Resp: Respirations even and unlabored. Cardio: No neck vein distention. Vascular: Pitting edema below the knees bilaterally.  Scattered areas of erythema at the lower one third of the legs bilaterally. Neuro: Alert and oriented.   Lab Results:  Lab Results  Component Value Date   WBC 7.3 05/15/2018   HGB 10.9 (L) 05/15/2018   HCT 32.8 (L) 05/15/2018   MCV 103.8 (H) 05/15/2018   PLT 321 05/15/2018   NEUTROABS 4.8 05/15/2018    Imaging:  No results found.  Medications: I have reviewed the patient's current medications.  Assessment/Plan: 1. Anterior liver mass, ultrasound-guided biopsy 09/22/2017-adenocarcinoma, MSI-stable, tumor mutation burden 0,IDH1 R132G, BAP1 loss ? CT chest 09/07/2017, 4.1 cm hypodense anterior liver mass, changes of chronic hypersensitivity pneumonitis ? MRI abdomen 09/13/2017-isolated liver mass measuring 4.7 x 3.8 cm involving segments 4B and 5, no additional liver masses ? SBRT to the liver mass beginning 10/31/2017, completed 11/10/2017 ? CT abdomen/pelvis 01/23/2018-radiation changes in the liver,  liver mass measuring 5.9 x 4.4 x 5.0 cm but no evidence of metastatic disease in the abdomen or pelvis ? MRI abdomen 02/22/2018-dominant central left liver lesion slightly increased in size with decreased central enhancement suggesting necrosis, 2 new lesions including an 11 mm left hepatic lesion and a 9 mm right hepatic lesion ? Xeloda 7 days on/7 days off beginning 03/27/2018 (1500 mg every morning and 1000 mg every afternoon) ? Xeloda dose reduced to 1000 mg every morning and 1000 mg every afternoon beginning 04/24/2018 due to diarrhea ? Xeloda discontinued after an office visit 05/15/2018 secondary to hand/foot syndrome and diarrhea ? CT abdomen/pelvis 06/12/2018- decrease in size of dominant left hepatic mass, no significant change in smaller liver lesions, stable biliary ductal dilatation, diffuse "colitis " 2. Abdominal pain 3. History of diastolic heart failure 4. Hypertension 5. G3, P3 6. History of anemia 7. Right foot cellulitis 05/22/2018-Keflex prescribed 05/23/2018 8. C. difficile colitis 06/14/2018-treated with vancomycin  Disposition: Ms. Siegmann appears stable.  The redness at the lower legs is most likely due to stasis dermatitis.  She will continue Lasix as she is currently taking.  She will elevate her legs above the level of the heart multiple times a day and at nighttime.  She will try compression stockings.  We discussed applying a moisturizer.  She is scheduled to return for a follow-up visit in approximately 1 month.  We are available to see her sooner as needed.  Patient seen with Dr. Benay Spice.    Ned Card ANP/GNP-BC   06/22/2018  3:58 PM This was a shared visit with Ned Card.  Ms. Mount was  interviewed and examined.  The leg edema is most likely related to heart failure.  She resumed Lasix 3 days ago without improvement.  We encouraged her to continue Lasix, elevate the legs, and try compression stockings.  We have a low clinical suspicion for deep vein thrombosis.   I doubt the leg edema is directly related to the malignancy.  She will let us know if the edema has not improved by next week.  Julieanne Manson, MD

## 2018-06-23 ENCOUNTER — Telehealth: Payer: Self-pay | Admitting: Nurse Practitioner

## 2018-06-23 NOTE — Telephone Encounter (Signed)
No los per 4/16.

## 2018-07-04 ENCOUNTER — Telehealth: Payer: Self-pay | Admitting: *Deleted

## 2018-07-04 ENCOUNTER — Other Ambulatory Visit: Payer: Self-pay | Admitting: *Deleted

## 2018-07-04 DIAGNOSIS — C221 Intrahepatic bile duct carcinoma: Secondary | ICD-10-CM

## 2018-07-04 DIAGNOSIS — A0472 Enterocolitis due to Clostridium difficile, not specified as recurrent: Secondary | ICD-10-CM

## 2018-07-04 MED ORDER — VANCOMYCIN HCL 125 MG PO CAPS: ORAL_CAPSULE | ORAL | 0 refills | Status: DC

## 2018-07-04 NOTE — Telephone Encounter (Addendum)
Called to report her diarrhea has returned just as it was prior to the antibiotic she took for C. Diff. Says stool looks and smells the same. MD notified. Per Dr. Benay Spice: Will need vancomycin again and will take for a longer period of time and taper down. Reviewed order with her and that script has been called in to pharmacy automated line. Call if diarrhea does not improve on this regimen.

## 2018-07-19 ENCOUNTER — Telehealth: Payer: Self-pay | Admitting: Oncology

## 2018-07-19 NOTE — Telephone Encounter (Signed)
Called per 5/19 and patient would prefer not to wait 40month

## 2018-07-25 ENCOUNTER — Inpatient Hospital Stay: Payer: Medicare Other | Attending: Oncology | Admitting: Oncology

## 2018-07-25 ENCOUNTER — Telehealth: Payer: Self-pay | Admitting: Oncology

## 2018-07-25 DIAGNOSIS — C221 Intrahepatic bile duct carcinoma: Secondary | ICD-10-CM

## 2018-07-25 NOTE — Progress Notes (Signed)
  [facility] OFFICE VISIT PROGRESS NOTE  I connected with@ on 07/25/18 at 10:45 AM EDT by telephone and verified that I am speaking with the correct person using two identifiers.    Patient's location: Home Provider's location: Office   Diagnosis: Cholangiocarcinoma  INTERVAL HISTORY:   This was a telephone visit with [name].  She consented to a telephone visit and lieu of an in person visit.  This is secondary to the current COVID pandemic.  The diarrhea returned in late April.  She is now completing a prolonged vancomycin taper.  She reports the diarrhea is "90% "improved.  Good appetite.  She has occasional abdominal cramps.  No consistent pain.  The leg swelling has resolved.  She takes Lasix twice daily.  Medications: I have reviewed the patient's current medications.  Assessment/Plan: 1. Anterior liver mass, ultrasound-guided biopsy 09/22/2017-adenocarcinoma, MSI-stable, tumor mutation burden 0,IDH1 R132G, BAP1 loss ? CT chest 09/07/2017, 4.1 cm hypodense anterior liver mass, changes of chronic hypersensitivity pneumonitis ? MRI abdomen 09/13/2017-isolated liver mass measuring 4.7 x 3.8 cm involving segments 4B and 5, no additional liver masses ? SBRT to the liver mass beginning 10/31/2017, completed 11/10/2017 ? CT abdomen/pelvis 01/23/2018-radiation changes in the liver, liver mass measuring 5.9 x 4.4 x 5.0 cm but no evidence of metastatic disease in the abdomen or pelvis ? MRI abdomen 02/22/2018-dominant central left liver lesion slightly increased in size with decreased central enhancement suggesting necrosis, 2 new lesions including an 11 mm left hepatic lesion and a 9 mm right hepatic lesion ? Xeloda 7 days on/7 days off beginning 03/27/2018 (1500 mg every morning and 1000 mg every afternoon) ? Xeloda dose reduced to 1000 mg every morning and 1000 mg every afternoon beginning 04/24/2018 due to diarrhea ? Xeloda discontinued  after an office visit 05/15/2018 secondary to hand/foot syndrome and diarrhea ? CT abdomen/pelvis 06/12/2018-decrease in size of dominant left hepatic mass, no significant change in smaller liver lesions, stable biliary ductal dilatation, diffuse "colitis " 2. Abdominal pain 3. History of diastolic heart failure 4. Hypertension 5. G3, P3 6. History of anemia 7. Right foot cellulitis 05/22/2018-Keflex prescribed 05/23/2018 8. C. difficile colitis 06/14/2018-treated with vancomycin, recurrent diarrhea 07/04/2018- second course of vancomycin (prolonged taper) prescribed   Disposition: [name] has a liver mass most consistent with intrahepatic cholangiocarcinoma.  Her overall status appears stable.  She is completing a second course of vancomycin for C. difficile colitis.  The diarrhea has improved.  We discussed treatment options.  She appears asymptomatic from the cancer.  I favor continued observation with a another telephone visit in approximately 1 month.  She agrees to this plan.  We will consider a restaging CT evaluation in July.  We will consider systemic chemotherapy if there is clinical or radiologic evidence of disease progression.   I discussed the assessment and treatment plan with the patient. The patient was provided an opportunity to ask questions and all were answered. The patient agreed with the plan and demonstrated an understanding of the instructions.   The patient was advised to call back or seek an in-person evaluation if the symptoms worsen or if the condition fails to improve as anticipated.  I provided 15 minutes of telephone/documentation time during this encounter, and > 50% was spent counseling as documented under my assessment & plan.  Betsy Coder ANP/GNP-BC   07/25/2018 10:44 AM

## 2018-07-25 NOTE — Telephone Encounter (Signed)
Scheduled appt per 5/19 los.  Patient aware of appt date and time.

## 2018-08-09 ENCOUNTER — Telehealth: Payer: Self-pay | Admitting: *Deleted

## 2018-08-09 NOTE — Telephone Encounter (Addendum)
Called to report her diarrhea has returned beginning 08/06/18--having 8-9 loose to liquid stools/day. No fever or nausea. Took last dose of QOD Vanco about 5-6 days ago. Asking for something to stop diarrhea. Per Dr. Benay Spice: This is not related to her cancer diagnosis. Needs to see PCP for management of diarrhea. Faxed last office note, C. Diff result and last vanco regimen to her PCP.

## 2018-08-21 ENCOUNTER — Inpatient Hospital Stay: Payer: Medicare Other | Attending: Oncology | Admitting: Oncology

## 2018-08-21 DIAGNOSIS — C221 Intrahepatic bile duct carcinoma: Secondary | ICD-10-CM

## 2018-08-21 DIAGNOSIS — I10 Essential (primary) hypertension: Secondary | ICD-10-CM | POA: Insufficient documentation

## 2018-08-21 DIAGNOSIS — D649 Anemia, unspecified: Secondary | ICD-10-CM | POA: Insufficient documentation

## 2018-08-21 MED ORDER — HYDROMORPHONE HCL 2 MG PO TABS: 1.0000 mg | ORAL_TABLET | ORAL | 0 refills | Status: DC | PRN

## 2018-08-21 NOTE — Progress Notes (Signed)
[facility] OFFICE VISIT PROGRESS NOTE  I connected with@ on 08/21/18 at 12:30 PM EDT by telephone and verified that I am speaking with the correct person using two identifiers.   I discussed the limitations, risks, security and privacy concerns of performing an evaluation and management service by telemedicine and the availability of in-person appointments. I also discussed with the patient that there may be a patient responsible charge related to this service. The patient expressed understanding and agreed to proceed.   Patient's location: Home Provider's location: Office  Diagnosis: Intrahepatic cholangiocarcinoma  INTERVAL HISTORY:   Ms. Schreier reports diarrhea returned on 08/06/2018.  She saw Dr. Justin Mend and was diagnosed with irritable bowel syndrome.  He reports the diarrhea is much improved after changing her diet.  She has approximately 2 bowel movements each morning and 1 in the afternoon.  Bowel movements are formed. She has developed increased abdominal "swelling "and pain.  The pain is not relieved with Tylenol and ibuprofen.  She requests a refill on hydromorphone.  She also reports anorexia.  Leg swelling and discoloration has improved.    Lab Results:  Lab Results  Component Value Date   WBC 7.3 05/15/2018   HGB 10.9 (L) 05/15/2018   HCT 32.8 (L) 05/15/2018   MCV 103.8 (H) 05/15/2018   PLT 321 05/15/2018   NEUTROABS 4.8 05/15/2018    Medications: I have reviewed the patient's current medications.  Assessment/Plan: 1. Anterior liver mass, ultrasound-guided biopsy 09/22/2017-adenocarcinoma, MSI-stable, tumor mutation burden 0,IDH1 R132G, BAP1 loss ? CT chest 09/07/2017, 4.1 cm hypodense anterior liver mass, changes of chronic hypersensitivity pneumonitis ? MRI abdomen 09/13/2017-isolated liver mass measuring 4.7 x 3.8 cm involving segments 4B and 5, no additional liver masses ? SBRT to the liver mass beginning 10/31/2017,  completed 11/10/2017 ? CT abdomen/pelvis 01/23/2018-radiation changes in the liver, liver mass measuring 5.9 x 4.4 x 5.0 cm but no evidence of metastatic disease in the abdomen or pelvis ? MRI abdomen 02/22/2018-dominant central left liver lesion slightly increased in size with decreased central enhancement suggesting necrosis, 2 new lesions including an 11 mm left hepatic lesion and a 9 mm right hepatic lesion ? Xeloda 7 days on/7 days off beginning 03/27/2018 (1500 mg every morning and 1000 mg every afternoon) ? Xeloda dose reduced to 1000 mg every morning and 1000 mg every afternoon beginning 04/24/2018 due to diarrhea ? Xeloda discontinued after an office visit 05/15/2018 secondary to hand/foot syndrome and diarrhea ? CT abdomen/pelvis 06/12/2018-decrease in size of dominant left hepatic mass, no significant change in smaller liver lesions, stable biliary ductal dilatation, diffuse "colitis " 2. Abdominal pain 3. History of diastolic heart failure 4. Hypertension 5. G3, P3 6. History of anemia 7. Right foot cellulitis 05/22/2018-Keflex prescribed 05/23/2018 8. C. difficile colitis 06/14/2018-treated with vancomycin, recurrent diarrhea 07/04/2018- second course of vancomycin (prolonged taper) prescribed   Disposition: Ms. Berke has adenocarcinoma involving a dominant liver mass.  She most likely has intrahepatic cholangiocarcinoma.  She remains off of specific therapy.  She has developed increased anorexia and abdominal pain.  She would like to proceed with a restaging CT evaluation.  She will be scheduled for a CT and lab visit on 08/31/2018.  She will return for an office visit on 09/01/2018.  I refilled her prescription for hydromorphone.  She reports improvement in diarrhea.   I discussed the assessment and treatment plan with the patient. The patient was provided an opportunity to ask questions and all were  answered. The patient agreed with the plan and demonstrated an understanding of the  instructions.   The patient was advised to call back or seek an in-person evaluation if the symptoms worsen or if the condition fails to improve as anticipated.  I provided 20 minutes of telephone, documentation, and chart review time during this encounter, and > 50% was spent counseling as documented under my assessment & plan.  Betsy Coder ANP/GNP-BC   08/21/2018 12:20 PM

## 2018-08-27 ENCOUNTER — Other Ambulatory Visit: Payer: Self-pay | Admitting: Oncology

## 2018-08-31 ENCOUNTER — Ambulatory Visit (HOSPITAL_COMMUNITY)
Admission: RE | Admit: 2018-08-31 | Discharge: 2018-08-31 | Disposition: A | Discharge status: Home / Self Care | Payer: Medicare Other | Source: Ambulatory Visit | Attending: Oncology | Admitting: Oncology

## 2018-08-31 ENCOUNTER — Inpatient Hospital Stay: Payer: Medicare Other

## 2018-08-31 ENCOUNTER — Other Ambulatory Visit: Payer: Self-pay

## 2018-08-31 DIAGNOSIS — C221 Intrahepatic bile duct carcinoma: Secondary | ICD-10-CM

## 2018-08-31 LAB — CMP (CANCER CENTER ONLY)
ALT: 15 U/L (ref 0–44)
AST: 18 U/L (ref 15–41)
Albumin: 3.2 g/dL — ABNORMAL LOW (ref 3.5–5.0)
Alkaline Phosphatase: 81 U/L (ref 38–126)
Anion gap: 9 (ref 5–15)
BUN: 16 mg/dL (ref 8–23)
CO2: 25 mmol/L (ref 22–32)
Calcium: 8.8 mg/dL — ABNORMAL LOW (ref 8.9–10.3)
Chloride: 105 mmol/L (ref 98–111)
Creatinine: 1.17 mg/dL — ABNORMAL HIGH (ref 0.44–1.00)
GFR, Est AFR Am: 48 mL/min — ABNORMAL LOW (ref >60)
GFR, Estimated: 41 mL/min — ABNORMAL LOW (ref >60)
Glucose, Bld: 91 mg/dL (ref 70–99)
Potassium: 4.1 mmol/L (ref 3.5–5.1)
Sodium: 139 mmol/L (ref 135–145)
Total Bilirubin: 0.4 mg/dL (ref 0.3–1.2)
Total Protein: 7.2 g/dL (ref 6.5–8.1)

## 2018-08-31 MED ORDER — SODIUM CHLORIDE (PF) 0.9 % IJ SOLN
INTRAMUSCULAR | Status: AC
  Filled 2018-08-31: qty 50

## 2018-08-31 MED ORDER — IOHEXOL 300 MG/ML  SOLN
100.0000 mL | Freq: Once | INTRAMUSCULAR | Status: AC | PRN
  Administered 2018-08-31: 80 mL via INTRAVENOUS

## 2018-09-01 ENCOUNTER — Other Ambulatory Visit: Payer: Self-pay

## 2018-09-01 ENCOUNTER — Inpatient Hospital Stay (HOSPITAL_BASED_OUTPATIENT_CLINIC_OR_DEPARTMENT_OTHER): Payer: Medicare Other | Admitting: Oncology

## 2018-09-01 VITALS — BP 116/57 | HR 63 | Temp 99.1°F | Resp 18 | Ht 62.0 in | Wt 138.3 lb

## 2018-09-01 DIAGNOSIS — Z7189 Other specified counseling: Secondary | ICD-10-CM | POA: Insufficient documentation

## 2018-09-01 DIAGNOSIS — I10 Essential (primary) hypertension: Secondary | ICD-10-CM | POA: Diagnosis not present

## 2018-09-01 DIAGNOSIS — C221 Intrahepatic bile duct carcinoma: Secondary | ICD-10-CM | POA: Diagnosis not present

## 2018-09-01 DIAGNOSIS — D649 Anemia, unspecified: Secondary | ICD-10-CM

## 2018-09-01 MED ORDER — PROCHLORPERAZINE MALEATE 5 MG PO TABS: 5.0000 mg | ORAL_TABLET | Freq: Four times a day (QID) | ORAL | 1 refills | Status: DC | PRN

## 2018-09-01 MED ORDER — LIDOCAINE-PRILOCAINE 2.5-2.5 % EX CREA: 1.0000 "application " | TOPICAL_CREAM | CUTANEOUS | 0 refills | Status: AC

## 2018-09-01 NOTE — Progress Notes (Signed)
START OFF PATHWAY REGIMEN - Other   [id number]:Nab-Paclitaxel (Abraxane) 125 mg/m2 D1, 8, 15 + Gemcitabine 1,000 mg/m2 D1, 8, 15 q28 Days:   A cycle is every 28 days:     Nab-paclitaxel (protein bound)      Gemcitabine   **Always confirm dose/schedule in your pharmacy ordering system**  Patient Characteristics: Intent of Therapy: Non-Curative / Palliative Intent, Discussed with Patient

## 2018-09-01 NOTE — Progress Notes (Signed)
[facility] OFFICE PROGRESS NOTE   Diagnosis: Cholangiocarcinoma  INTERVAL HISTORY:   Summer Nielsen returns as scheduled.  The diarrhea remains improved.  She has intermittent loose stool, chiefly after eating.  She does not have diarrhea every day.  She complains of increased pain in the right upper abdomen.  Objective:  Vital signs in last 24 hours:  Blood pressure (!) 116/57, pulse 63, temperature 99.1 F (37.3 C), temperature source Oral, resp. rate 18, height 5' 2" (1.575 m), weight 138 lb 4.8 oz (62.7 kg), SpO2 92 %.    Resp: Inspiratory rales at the bilateral posterior chest, no respiratory distress Cardio: Regular rate and rhythm GI: No hepatomegaly, no mass, nontender Vascular: Chronic stasis change with trace edema of the lower leg bilaterally   Lab Results:  Lab Results  Component Value Date   WBC 7.3 05/15/2018   HGB 10.9 (L) 05/15/2018   HCT 32.8 (L) 05/15/2018   MCV 103.8 (H) 05/15/2018   PLT 321 05/15/2018   NEUTROABS 4.8 05/15/2018    CMP  Lab Results  Component Value Date   NA 139 08/31/2018   K 4.1 08/31/2018   CL 105 08/31/2018   CO2 25 08/31/2018   GLUCOSE 91 08/31/2018   BUN 16 08/31/2018   CREATININE 1.17 (H) 08/31/2018   CALCIUM 8.8 (L) 08/31/2018   PROT 7.2 08/31/2018   ALBUMIN 3.2 (L) 08/31/2018   AST 18 08/31/2018   ALT 15 08/31/2018   ALKPHOS 81 08/31/2018   BILITOT 0.4 08/31/2018   GFRNONAA 41 (L) 08/31/2018   GFRAA 48 (L) 08/31/2018    Lab Results  Component Value Date   CEA1 1.96 03/21/2018     Imaging:  Ct Abdomen Pelvis W Contrast  Result Date: 08/31/2018 CLINICAL DATA:  85 year old female with history of intrahepatic cholangiocarcinoma diagnosed in August 2019 status post SBRT to the liver completed on 11/10/2017, followed by chemotherapy which finished in March 2020. EXAM: CT ABDOMEN AND PELVIS WITH CONTRAST TECHNIQUE: Multidetector CT imaging of the abdomen and pelvis was performed using the standard  protocol following bolus administration of intravenous contrast. CONTRAST:  65m OMNIPAQUE IOHEXOL 300 MG/ML  SOLN COMPARISON:  CT the abdomen and pelvis 06/12/2018. FINDINGS: Lower chest: Multiple small calcified granulomas throughout the visualize lung bases. Patchy multifocal ground-glass attenuation and septal thickening throughout the visualize lung bases, similar to the prior study, most compatible with underlying interstitial lung disease. Hepatobiliary: The primary lesion in segment 5 of the liver is smaller than prior examinations, currently measuring 3.4 x 2.6 cm (axial image 36 of series 5). Other previously noted lesions have clearly increased in size, including a segment 8 lesion (axial image 29 of series 5) measuring 18 x 21 mm (previously 14 mm), a lesion between segments 7 and 8 (axial image 26 of series 5) measuring 13 x 19 mm (previously 14 mm), and subtle lesion centrally between segments 4A and 8 (axial image 20 of series 5) measuring 10 x 15 mm (previously 12 mm). Several new hypovascular lesions are noted, largest of which is in segment 5 measuring 2.4 x 1.9 cm (axial image 56 of series 5) and 2.0 x 1.7 cm (axial image 49 of series 5). Smaller lesion in segment 7 (axial image 30 of series 5) measuring 10 x 8 mm. Status post cholecystectomy. No intrahepatic biliary ductal dilatation. Common bile duct is dilated measuring up to 15 mm in the porta hepatis, similar to prior studies, presumably reflective of benign post cholecystectomy physiology. Pancreas:  No pancreatic mass. No pancreatic ductal dilatation. No pancreatic or peripancreatic fluid or inflammatory changes. Spleen: Unremarkable. Adrenals/Urinary Tract: Atrophy of the left kidney. Low-attenuation lesions in the right kidney measuring up to 2.7 x 2.2 cm in the upper pole the right kidney, compatible with a simple cyst. Other subcentimeter low-attenuation lesions in the right kidney are too small to definitively characterize, but are  statistically likely to represent tiny cysts. No hydroureteronephrosis. Urinary bladder is normal in appearance. Bilateral adrenal glands are normal in appearance. Stomach/Bowel: Normal appearance of the stomach. No pathologic dilatation of small bowel or colon. Widespread colonic mural thickening, most severe in the cecum and ascending colon, as well as the descending colon, compatible with colitis. A few scattered colonic diverticulae are noted, without definite focal surrounding inflammatory changes to suggest an acute diverticulitis at this time. The appendix is not confidently identified and may be surgically absent. Regardless, there are no inflammatory changes noted adjacent to the cecum to suggest the presence of an acute appendicitis at this time. Vascular/Lymphatic: Aortic atherosclerosis, without evidence of aneurysm or dissection in the abdominal or pelvic vasculature. No lymphadenopathy noted in the abdomen or pelvis. Reproductive: Status post hysterectomy. Ovaries are not confidently identified may be surgically absent or atrophic. Other: No significant volume of ascites.  No pneumoperitoneum. Musculoskeletal: Status post left hip arthroplasty and ORIF in the left hemipelvis. Status post PLIF at L5-S1. Chronic T12 compression fracture with 25% loss of anterior vertebral body height. There are no aggressive appearing lytic or blastic lesions noted in the visualized portions of the skeleton. IMPRESSION: 1. While the primary lesion in segment 5 of the liver is smaller than prior examinations, the other previously noted lesions have increased in size, and there are numerous new lesions, compatible with overall progression of disease, as detailed above. 2. No definite extrahepatic metastatic disease identified on today's examination. 3. Persistent colonic thickening, concerning for persistent colitis. 4. Colonic diverticulosis. 5. Aortic atherosclerosis. 6. Chronic changes of interstitial lung disease again  noted in the visualized lung bases. 7. Additional incidental findings, as above. Electronically Signed   By: Vinnie Langton M.D.   On: 08/31/2018 15:21    Medications: I have reviewed the patient's current medications.   Assessment/Plan: 1. Anterior liver mass, ultrasound-guided biopsy 09/22/2017-adenocarcinoma, MSI-stable, tumor mutation burden 0,IDH1 R132G, BAP1 loss ? CT chest 09/07/2017, 4.1 cm hypodense anterior liver mass, changes of chronic hypersensitivity pneumonitis ? MRI abdomen 09/13/2017-isolated liver mass measuring 4.7 x 3.8 cm involving segments 4B and 5, no additional liver masses ? SBRT to the liver mass beginning 10/31/2017, completed 11/10/2017 ? CT abdomen/pelvis 01/23/2018-radiation changes in the liver, liver mass measuring 5.9 x 4.4 x 5.0 cm but no evidence of metastatic disease in the abdomen or pelvis ? MRI abdomen 02/22/2018-dominant central left liver lesion slightly increased in size with decreased central enhancement suggesting necrosis, 2 new lesions including an 11 mm left hepatic lesion and a 9 mm right hepatic lesion ? Xeloda 7 days on/7 days off beginning 03/27/2018 (1500 mg every morning and 1000 mg every afternoon) ? Xeloda dose reduced to 1000 mg every morning and 1000 mg every afternoon beginning 04/24/2018 due to diarrhea ? Xeloda discontinued after an office visit 05/15/2018 secondary to hand/foot syndrome and diarrhea ? CT abdomen/pelvis 06/12/2018-decrease in size of dominant left hepatic mass, no significant change in smaller liver lesions, stable biliary ductal dilatation, diffuse "colitis " ? CT 08/31/2018- decreased size of the segment 5 lesion, increased size of other liver lesions and new  lesions, no evidence of extrahepatic metastatic disease, persistent colonic thickening 2. Abdominal pain 3. History of diastolic heart failure 4. Hypertension 5. G3, P3 6. History of anemia 7. Right foot cellulitis 05/22/2018-Keflex prescribed 05/23/2018 8. C. difficile  colitis 06/14/2018-treated with vancomycin, recurrent diarrhea 07/04/2018- second course of vancomycin (prolonged taper) prescribed, persistent diarrhea 08/06/2018-improved after diet change    Disposition: Summer Nielsen has cholangiocarcinoma.  There are multiple liver lesions.  She is symptomatic with left upper quadrant pain.  She was unable to tolerate Xeloda.  The restaging CT reveals progressive disease in the liver.  I discussed treatment options with Summer Nielsen.  We discussed supportive care versus a trial of systemic chemotherapy.  She understands no therapy will be curative.  The goal of treatment is palliative.  She would like to complete a trial of chemotherapy.  I recommend gemcitabine/Abraxane.  We reviewed potential toxicities associated with this regimen including the chance for nausea/vomiting, alopecia, and hematologic toxicity.  We discussed the fever, rash, and pneumonitis associated with gemcitabine.  We discussed the allergic reaction and neuropathy seen with Abraxane.  She agrees to proceed.  She will attend a chemotherapy teaching class. Summer Nielsen will be referred for Port-A-Cath placement.  The plan is to begin gemcitabine/Abraxane on 09/12/2018.  She will be seen for an office visit on that day.  She is at increased risk of developing recurrent C. difficile colitis based on the recent CT finding, ongoing loose stool, and chemotherapy.  40 minutes were spent with the patient today.  The majority of the time was used for counseling and coordination of care.  Betsy Coder, MD  09/01/2018  3:56 PM

## 2018-09-01 NOTE — Patient Instructions (Signed)
Provided patient handouts on Gemzar, Abraxane, PAC and how to use EMLA cream. Scheduled her chemo education session for 09/06/18 at 10:00 on phone.

## 2018-09-01 NOTE — Progress Notes (Signed)
Called and left VM for [facility] regarding need for port for treatment on 09/06/2018

## 2018-09-05 ENCOUNTER — Other Ambulatory Visit: Payer: Self-pay | Admitting: Radiology

## 2018-09-05 ENCOUNTER — Telehealth: Payer: Self-pay | Admitting: Oncology

## 2018-09-05 NOTE — Telephone Encounter (Signed)
Called and spoke with patient. Confirmed dates and time

## 2018-09-06 ENCOUNTER — Ambulatory Visit (HOSPITAL_COMMUNITY)
Admission: RE | Admit: 2018-09-06 | Discharge: 2018-09-06 | Disposition: A | Discharge status: Home / Self Care | Payer: Medicare Other | Source: Ambulatory Visit | Attending: Oncology | Admitting: Oncology

## 2018-09-06 ENCOUNTER — Inpatient Hospital Stay: Payer: Medicare Other | Attending: Oncology

## 2018-09-06 ENCOUNTER — Other Ambulatory Visit: Payer: Self-pay

## 2018-09-06 ENCOUNTER — Telehealth: Payer: Self-pay | Admitting: *Deleted

## 2018-09-06 ENCOUNTER — Encounter (HOSPITAL_COMMUNITY): Payer: Self-pay

## 2018-09-06 ENCOUNTER — Other Ambulatory Visit: Payer: Self-pay | Admitting: Oncology

## 2018-09-06 DIAGNOSIS — K219 Gastro-esophageal reflux disease without esophagitis: Secondary | ICD-10-CM | POA: Diagnosis not present

## 2018-09-06 DIAGNOSIS — Z5112 Encounter for antineoplastic immunotherapy: Secondary | ICD-10-CM | POA: Insufficient documentation

## 2018-09-06 DIAGNOSIS — Z885 Allergy status to narcotic agent status: Secondary | ICD-10-CM | POA: Diagnosis not present

## 2018-09-06 DIAGNOSIS — Z85820 Personal history of malignant melanoma of skin: Secondary | ICD-10-CM | POA: Diagnosis not present

## 2018-09-06 DIAGNOSIS — Z90710 Acquired absence of both cervix and uterus: Secondary | ICD-10-CM | POA: Diagnosis not present

## 2018-09-06 DIAGNOSIS — M810 Age-related osteoporosis without current pathological fracture: Secondary | ICD-10-CM | POA: Insufficient documentation

## 2018-09-06 DIAGNOSIS — H409 Unspecified glaucoma: Secondary | ICD-10-CM | POA: Diagnosis not present

## 2018-09-06 DIAGNOSIS — Z79899 Other long term (current) drug therapy: Secondary | ICD-10-CM | POA: Insufficient documentation

## 2018-09-06 DIAGNOSIS — C221 Intrahepatic bile duct carcinoma: Secondary | ICD-10-CM | POA: Insufficient documentation

## 2018-09-06 DIAGNOSIS — I509 Heart failure, unspecified: Secondary | ICD-10-CM | POA: Diagnosis not present

## 2018-09-06 DIAGNOSIS — Z881 Allergy status to other antibiotic agents status: Secondary | ICD-10-CM | POA: Insufficient documentation

## 2018-09-06 DIAGNOSIS — Z96642 Presence of left artificial hip joint: Secondary | ICD-10-CM | POA: Diagnosis not present

## 2018-09-06 DIAGNOSIS — Z79890 Hormone replacement therapy (postmenopausal): Secondary | ICD-10-CM | POA: Diagnosis not present

## 2018-09-06 DIAGNOSIS — E039 Hypothyroidism, unspecified: Secondary | ICD-10-CM | POA: Diagnosis not present

## 2018-09-06 DIAGNOSIS — E785 Hyperlipidemia, unspecified: Secondary | ICD-10-CM | POA: Insufficient documentation

## 2018-09-06 DIAGNOSIS — Z5111 Encounter for antineoplastic chemotherapy: Secondary | ICD-10-CM | POA: Insufficient documentation

## 2018-09-06 DIAGNOSIS — Z8673 Personal history of transient ischemic attack (TIA), and cerebral infarction without residual deficits: Secondary | ICD-10-CM | POA: Insufficient documentation

## 2018-09-06 DIAGNOSIS — Z96651 Presence of right artificial knee joint: Secondary | ICD-10-CM | POA: Insufficient documentation

## 2018-09-06 DIAGNOSIS — D649 Anemia, unspecified: Secondary | ICD-10-CM | POA: Insufficient documentation

## 2018-09-06 DIAGNOSIS — I10 Essential (primary) hypertension: Secondary | ICD-10-CM | POA: Insufficient documentation

## 2018-09-06 HISTORY — PX: IR IMAGING GUIDED PORT INSERTION: IMG5740

## 2018-09-06 LAB — CBC WITH DIFFERENTIAL/PLATELET
Abs Immature Granulocytes: 0.03 10*3/uL (ref 0.00–0.07)
Basophils Absolute: 0.1 10*3/uL (ref 0.0–0.1)
Basophils Relative: 1 %
Eosinophils Absolute: 1 10*3/uL — ABNORMAL HIGH (ref 0.0–0.5)
Eosinophils Relative: 10 %
HCT: 35.4 % — ABNORMAL LOW (ref 36.0–46.0)
Hemoglobin: 11.4 g/dL — ABNORMAL LOW (ref 12.0–15.0)
Immature Granulocytes: 0 %
Lymphocytes Relative: 21 %
Lymphs Abs: 2.1 10*3/uL (ref 0.7–4.0)
MCH: 31.5 pg (ref 26.0–34.0)
MCHC: 32.2 g/dL (ref 30.0–36.0)
MCV: 97.8 fL (ref 80.0–100.0)
Monocytes Absolute: 1 10*3/uL (ref 0.1–1.0)
Monocytes Relative: 11 %
Neutro Abs: 5.5 10*3/uL (ref 1.7–7.7)
Neutrophils Relative %: 57 %
Platelets: 330 10*3/uL (ref 150–400)
RBC: 3.62 MIL/uL — ABNORMAL LOW (ref 3.87–5.11)
RDW: 14.6 % (ref 11.5–15.5)
WBC: 9.7 10*3/uL (ref 4.0–10.5)
nRBC: 0 % (ref 0.0–0.2)

## 2018-09-06 LAB — PROTIME-INR
INR: 1.1 (ref 0.8–1.2)
Prothrombin Time: 13.6 seconds (ref 11.4–15.2)

## 2018-09-06 MED ORDER — FENTANYL CITRATE (PF) 100 MCG/2ML IJ SOLN
INTRAMUSCULAR | Status: AC | PRN
  Administered 2018-09-06: 50 ug via INTRAVENOUS

## 2018-09-06 MED ORDER — MIDAZOLAM HCL 2 MG/2ML IJ SOLN
INTRAMUSCULAR | Status: AC | PRN
  Administered 2018-09-06 (×2): 0.5 mg via INTRAVENOUS
  Administered 2018-09-06: 1 mg via INTRAVENOUS

## 2018-09-06 MED ORDER — MIDAZOLAM HCL 2 MG/2ML IJ SOLN
INTRAMUSCULAR | Status: AC
  Filled 2018-09-06: qty 2

## 2018-09-06 MED ORDER — CEFAZOLIN SODIUM-DEXTROSE 2-4 GM/100ML-% IV SOLN
2.0000 g | INTRAVENOUS | Status: AC
  Administered 2018-09-06: 2 g via INTRAVENOUS

## 2018-09-06 MED ORDER — SODIUM CHLORIDE 0.9 % IV SOLN
INTRAVENOUS | Status: DC
  Administered 2018-09-06: 15:00:00 via INTRAVENOUS

## 2018-09-06 MED ORDER — CEFAZOLIN SODIUM-DEXTROSE 2-4 GM/100ML-% IV SOLN
INTRAVENOUS | Status: AC
  Administered 2018-09-06: 16:00:00 2 g via INTRAVENOUS
  Filled 2018-09-06: qty 100

## 2018-09-06 MED ORDER — LIDOCAINE-EPINEPHRINE (PF) 2 %-1:200000 IJ SOLN
INTRAMUSCULAR | Status: AC
  Filled 2018-09-06: qty 20

## 2018-09-06 MED ORDER — FENTANYL CITRATE (PF) 100 MCG/2ML IJ SOLN
INTRAMUSCULAR | Status: AC
  Filled 2018-09-06: qty 2

## 2018-09-06 MED ORDER — HEPARIN SOD (PORK) LOCK FLUSH 100 UNIT/ML IV SOLN
INTRAVENOUS | Status: AC
  Filled 2018-09-06: qty 5

## 2018-09-06 MED ORDER — LIDOCAINE-EPINEPHRINE (PF) 1 %-1:200000 IJ SOLN
INTRAMUSCULAR | Status: AC | PRN
  Administered 2018-09-06 (×2): 10 mL

## 2018-09-06 NOTE — Discharge Instructions (Signed)
Moderate Conscious Sedation, Adult, Care After These instructions provide you with information about caring for yourself after your procedure. Your health care provider may also give you more specific instructions. Your treatment has been planned according to current medical practices, but problems sometimes occur. Call your health care provider if you have any problems or questions after your procedure. What can I expect after the procedure? After your procedure, it is common:  To feel sleepy for several hours.  To feel clumsy and have poor balance for several hours.  To have poor judgment for several hours.  To vomit if you eat too soon. Follow these instructions at home: For at least 24 hours after the procedure:   Do not: ? Participate in activities where you could fall or become injured. ? Drive. ? Use heavy machinery. ? Drink alcohol. ? Take sleeping pills or medicines that cause drowsiness. ? Make important decisions or sign legal documents. ? Take care of children on your own.  Rest. Eating and drinking  Follow the diet recommended by your health care provider.  If you vomit: ? Drink water, juice, or soup when you can drink without vomiting. ? Make sure you have little or no nausea before eating solid foods. General instructions  Have a responsible adult stay with you until you are awake and alert.  Take over-the-counter and prescription medicines only as told by your health care provider.  If you smoke, do not smoke without supervision.  Keep all follow-up visits as told by your health care provider. This is important. Contact a health care provider if:  You keep feeling nauseous or you keep vomiting.  You feel light-headed.  You develop a rash.  You have a fever. Get help right away if:  You have trouble breathing. This information is not intended to replace advice given to you by your health care provider. Make sure you discuss any questions you have  with your health care provider. Document Released: 12/13/2012 Document Revised: 02/04/2017 Document Reviewed: 06/14/2015 Elsevier Patient Education  [place] An implanted port is a device that is placed under the skin. It is usually placed in the chest. The device can be used to give IV medicine, to take blood, or for dialysis. You may have an implanted port if:  You need IV medicine that would be irritating to the small veins in your hands or arms.  You need IV medicines, such as antibiotics, for a long period of time.  You need IV nutrition for a long period of time.  You need dialysis. Having a port means that your health care provider will not need to use the veins in your arms for these procedures. You may have fewer limitations when using a port than you would if you used other types of long-term IVs, and you will likely be able to return to normal activities after your incision heals. An implanted port has two main parts:  Reservoir. The reservoir is the part where a needle is inserted to give medicines or draw blood. The reservoir is round. After it is placed, it appears as a small, raised area under your skin.  Catheter. The catheter is a thin, flexible tube that connects the reservoir to a vein. Medicine that is inserted into the reservoir goes into the catheter and then into the vein. How is my port accessed? To access your port:  A numbing cream may be placed on the skin over the port site.  Your health care provider will put on a mask and sterile gloves.  The skin over your port will be cleaned carefully with a germ-killing soap and allowed to dry.  Your health care provider will gently pinch the port and insert a needle into it.  Your health care provider will check for a blood return to make sure the port is in the vein and is not clogged.  If your port needs to remain accessed to get medicine continuously (constant infusion),  your health care provider will place a clear bandage (dressing) over the needle site. The dressing and needle will need to be changed every week, or as told by your health care provider. What is flushing? Flushing helps keep the port from getting clogged. Follow instructions from your health care provider about how and when to flush the port. Ports are usually flushed with saline solution or a medicine called heparin. The need for flushing will depend on how the port is used:  If the port is only used from time to time to give medicines or draw blood, the port may need to be flushed: ? Before and after medicines have been given. ? Before and after blood has been drawn. ? As part of routine maintenance. Flushing may be recommended every 4-6 weeks.  If a constant infusion is running, the port may not need to be flushed.  Throw away any syringes in a disposal container that is meant for sharp items (sharps container). You can buy a sharps container from a pharmacy, or you can make one by using an empty hard plastic bottle with a cover. How long will my port stay implanted? The port can stay in for as long as your health care provider thinks it is needed. When it is time for the port to come out, a surgery will be done to remove it. The surgery will be similar to the procedure that was done to put the port in. Follow these instructions at home:   Flush your port as told by your health care provider.  If you need an infusion over several days, follow instructions from your health care provider about how to take care of your port site. Make sure you: ? Wash your hands with soap and water before you change your dressing. If soap and water are not available, use alcohol-based hand sanitizer. ? Change your dressing as told by your health care provider.  You may remove your dressing tomorrow.  Do not use EMLA cream for 2 weeks after port placement as this cream will remove surgical glue on your  incision. ? Place any used dressings or infusion bags into a plastic bag. Throw that bag in the trash. ? Keep the dressing that covers the needle clean and dry. Do not get it wet. ? Do not use scissors or sharp objects near the tube. ? Keep the tube clamped, unless it is being used.  Check your port site every day for signs of infection. Check for: ? Redness, swelling, or pain. ? Fluid or blood. ? Pus or a bad smell.  Protect the skin around the port site. ? Avoid wearing bra straps that rub or irritate the site. ? Protect the skin around your port from seat belts. Place a soft pad over your chest if needed.  Bathe or shower as told by your health care provider. The site may get wet as long as you are not actively receiving an infusion.  You may shower tomorrow.  Return to  your normal activities as told by your health care provider. Ask your health care provider what activities are safe for you.  Carry a medical alert card or wear a medical alert bracelet at all times. This will let health care providers know that you have an implanted port in case of an emergency. Get help right away if:  You have redness, swelling, or pain at the port site.  You have fluid or blood coming from your port site.  You have pus or a bad smell coming from the port site.  You have a fever. Summary  Implanted ports are usually placed in the chest for long-term IV access.  Follow instructions from your health care provider about flushing the port and changing bandages (dressings).  Take care of the area around your port by avoiding clothing that puts pressure on the area, and by watching for signs of infection.  Protect the skin around your port from seat belts. Place a soft pad over your chest if needed.  Get help right away if you have a fever or you have redness, swelling, pain, drainage, or a bad smell at the port site. This information is not intended to replace advice given to you by your  health care provider. Make sure you discuss any questions you have with your health care provider. Document Released: 02/22/2005 Document Revised: 06/16/2018 Document Reviewed: 03/27/2016 Elsevier Patient Education  2020 [employer].

## 2018-09-06 NOTE — H&P (Signed)
Referring Physician(s): Ladell Pier  Supervising Physician: Sandi Mariscal  Patient Status:  [facility] OP  Chief Complaint:  "I'm getting a port a cath"  Subjective: Patient familiar to IR service from liver mass biopsy in 2019.  She has a history of progressive cholangiocarcinoma and presents today for Port-A-Cath placement for palliative chemotherapy.  She currently denies fever, headache, chest pain, dyspnea, cough, back pain, nausea, vomiting or bleeding.  She does have some intermittent right upper quadrant/epigastric discomfort.  Past Medical History:  Diagnosis Date  . Cancer ([place])    squamous cell skin cancer on left leg-removed  . CHF (congestive heart failure) ([place])   . Cholangiocarcinoma ([place]) dx'd 11/2017  . Complication of anesthesia   . GERD (gastroesophageal reflux disease)   . Glaucoma   . High blood pressure   . Hyperlipidemia   . Hypothyroidism   . OP (osteoporosis)   . PONV (postoperative nausea and vomiting)   . Postsurgical hypothyroidism   . 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 Tachycardia   . TIA (transient ischemic attack)    Past Surgical History:  Procedure Laterality Date  . ABDOMINAL HYSTERECTOMY    . BACK SURGERY N/A 03/2017   Lumbar  . CHOLECYSTECTOMY    . EYE SURGERY     removed cateracts on both eyes  . JOINT REPLACEMENT     3 hip replacement on left, right knee replacement  . THYROIDECTOMY        Allergies: Levofloxacin and Percocet [oxycodone-acetaminophen]  Medications: Prior to Admission medications   Medication Sig Start Date End Date Taking? Authorizing Provider  acetaminophen (TYLENOL) 500 MG tablet Take 1,000 mg by mouth daily as needed for moderate pain or headache.    [provider]  amLODipine (NORVASC) 5 MG tablet Take 5 mg by mouth daily.    [provider]  atorvastatin (LIPITOR) 20 MG tablet Take 20 mg by mouth daily. 02/17/17   [provider]   bimatoprost (LUMIGAN) 0.01 % SOLN Place 1 drop into both eyes at bedtime.     [provider]  calcium-vitamin D (OSCAL WITH D) 500-200 MG-UNIT tablet Take 1 tablet by mouth daily.     [provider]  diphenoxylate-atropine (LOMOTIL) 2.5-0.025 MG tablet Take 2 tablets by mouth 4 (four) times daily as needed for diarrhea or loose stools. 06/05/18   Ladell Pier, MD  furosemide (LASIX) 40 MG tablet Take 40 mg by mouth 2 (two) times daily.    [provider]  gabapentin (NEURONTIN) 300 MG capsule TAKE 1 CAPSULE BY MOUTH THREE TIMES A DAY 12/12/17   Magnus Sinning, MD  HYDROmorphone (DILAUDID) 2 MG tablet Take 0.5-1 tablets (1-2 mg total) by mouth every 4 (four) hours as needed for severe pain. 08/21/18   Ladell Pier, MD  ibuprofen (ADVIL,MOTRIN) 200 MG tablet Take 200 mg by mouth every 6 (six) hours as needed for moderate pain (back surgery).    [provider]  Iron-FA-B Cmp-C-Biot-Probiotic (FUSION PLUS) CAPS Take 1 capsule by mouth daily. 04/14/17   Jessy Oto, MD  KLOR-CON M10 10 MEQ tablet TAKE 2 TABLETS EVERY DAY 08/28/18   Ladell Pier, MD  levothyroxine (SYNTHROID, LEVOTHROID) 150 MCG tablet Take 150 mcg by mouth daily. 06/01/18   [provider]  lidocaine-prilocaine (EMLA) cream Apply 1 application topically as directed. 09/01/18   Ladell Pier, MD  loperamide (IMODIUM) 2 MG capsule Take 2 capsules by mouth 4 (four) times  daily as needed.    [provider]  losartan (COZAAR) 50 MG tablet Take 50 mg by mouth daily.    [provider]  omeprazole (PRILOSEC) 20 MG capsule Take 30- 60 min before your first and last meals of the day 08/30/17   Tanda Rockers, MD  prochlorperazine (COMPAZINE) 5 MG tablet Take 1-2 tablets (5-10 mg total) by mouth every 6 (six) hours as needed for nausea or vomiting. 09/01/18   Ladell Pier, MD  propranolol (INDERAL) 20 MG tablet Take 20 mg by mouth 2 (two) times daily.     [provider]  traMADol-acetaminophen (ULTRACET) 37.5-325 MG tablet Take 1 tablet by mouth every 6 (six) hours as needed. 04/24/18   Jessy Oto, MD     Vital Signs: BP (!) 146/55   Pulse (!) 58   Temp 97.9 F (36.6 C) (Oral)   Resp 16   LMP  (LMP Unknown)   SpO2 98%   Physical Exam awake, alert.  Chest with slightly more distant breath sounds on left than right.  Heart with regular rate and rhythm.  Abdomen soft, positive bowel sounds, currently nontender.  Bilateral pretibial edema noted.  Imaging: No results found.  Labs:  CBC: Recent Labs    09/22/17 1115 03/21/18 1601 04/21/18 1355 05/15/18 1051  WBC 8.7 8.2 7.7 7.3  HGB 12.1 11.8* 11.4* 10.9*  HCT 37.2 35.9* 33.9* 32.8*  PLT 361 276 256 321    COAGS: Recent Labs    09/22/17 1115  INR 0.95  APTT 34    BMP: Recent Labs    05/15/18 1051 06/07/18 1040 06/12/18 0846 08/31/18 1136  NA 140 136 136 139  K 2.8* 3.5 4.0 4.1  CL 101 101 102 105  CO2 _0 GLUCOSE 115* 127* 106* 91  BUN _1 CALCIUM 8.4* 7.3* 7.1* 8.8*  CREATININE 1.11* 1.32* 1.30* 1.17*  GFRNONAA 44* 36* 36* 41*  GFRAA 51* 41* 42* 48*    LIVER FUNCTION TESTS: Recent Labs    04/21/18 1355 05/15/18 1051 06/07/18 1040 08/31/18 1136  BILITOT 0.5 0.8 0.8 0.4  AST _2 ALT _3 ALKPHOS 72 65 90 81  PROT 7.0 6.1* 6.6 7.2  ALBUMIN 3.7 3.3* 2.6* 3.2*    Assessment and Plan: Pt with history of progressive cholangiocarcinoma ; presents today for Port-A-Cath placement for palliative chemotherapy. Risks and benefits of image guided port-a-catheter placement was discussed with the patient including, but not limited to bleeding, infection, pneumothorax, or fibrin sheath development and need for additional procedures.  All of the patient's questions were answered, patient is agreeable to proceed. Consent signed and in chart.  LABS PENDING   Electronically Signed: D. Rowe Robert, PA-C 09/06/2018, 2:22  PM   I spent a total of 25 minutes at the the patient's bedside AND on the patient's hospital floor or unit, greater than 50% of which was counseling/coordinating care for port a cath placement

## 2018-09-06 NOTE — Procedures (Signed)
Pre Procedure Dx: Cholangiocarcinoma Post Procedural Dx: Same  Successful placement of right IJ approach port-a-cath with tip at the superior caval atrial junction. The catheter is ready for immediate use.  Estimated Blood Loss: Minimal  Complications: None immediate.  Ronny Bacon, MD Pager #: [phone]

## 2018-09-10 ENCOUNTER — Other Ambulatory Visit: Payer: Self-pay | Admitting: Oncology

## 2018-09-12 ENCOUNTER — Inpatient Hospital Stay (HOSPITAL_BASED_OUTPATIENT_CLINIC_OR_DEPARTMENT_OTHER): Payer: Medicare Other | Admitting: Nurse Practitioner

## 2018-09-12 ENCOUNTER — Inpatient Hospital Stay: Payer: Medicare Other

## 2018-09-12 ENCOUNTER — Encounter: Payer: Self-pay | Admitting: Nurse Practitioner

## 2018-09-12 ENCOUNTER — Other Ambulatory Visit: Payer: Self-pay

## 2018-09-12 VITALS — BP 132/59 | HR 60

## 2018-09-12 VITALS — BP 140/59 | HR 70 | Temp 99.1°F | Resp 17 | Ht 62.0 in | Wt 136.8 lb

## 2018-09-12 DIAGNOSIS — C221 Intrahepatic bile duct carcinoma: Secondary | ICD-10-CM

## 2018-09-12 DIAGNOSIS — Z5112 Encounter for antineoplastic immunotherapy: Secondary | ICD-10-CM | POA: Diagnosis not present

## 2018-09-12 DIAGNOSIS — I10 Essential (primary) hypertension: Secondary | ICD-10-CM | POA: Diagnosis not present

## 2018-09-12 DIAGNOSIS — D649 Anemia, unspecified: Secondary | ICD-10-CM

## 2018-09-12 DIAGNOSIS — Z5111 Encounter for antineoplastic chemotherapy: Secondary | ICD-10-CM | POA: Diagnosis present

## 2018-09-12 LAB — CBC WITH DIFFERENTIAL (CANCER CENTER ONLY)
Abs Immature Granulocytes: 0.03 10*3/uL (ref 0.00–0.07)
Basophils Absolute: 0.1 10*3/uL (ref 0.0–0.1)
Basophils Relative: 1 %
Eosinophils Absolute: 0.7 10*3/uL — ABNORMAL HIGH (ref 0.0–0.5)
Eosinophils Relative: 8 %
HCT: 33.2 % — ABNORMAL LOW (ref 36.0–46.0)
Hemoglobin: 10.5 g/dL — ABNORMAL LOW (ref 12.0–15.0)
Immature Granulocytes: 0 %
Lymphocytes Relative: 14 %
Lymphs Abs: 1.4 10*3/uL (ref 0.7–4.0)
MCH: 30.8 pg (ref 26.0–34.0)
MCHC: 31.6 g/dL (ref 30.0–36.0)
MCV: 97.4 fL (ref 80.0–100.0)
Monocytes Absolute: 0.9 10*3/uL (ref 0.1–1.0)
Monocytes Relative: 9 %
Neutro Abs: 6.5 10*3/uL (ref 1.7–7.7)
Neutrophils Relative %: 68 %
Platelet Count: 291 10*3/uL (ref 150–400)
RBC: 3.41 MIL/uL — ABNORMAL LOW (ref 3.87–5.11)
RDW: 14.7 % (ref 11.5–15.5)
WBC Count: 9.6 10*3/uL (ref 4.0–10.5)
nRBC: 0 % (ref 0.0–0.2)

## 2018-09-12 LAB — CMP (CANCER CENTER ONLY)
ALT: 11 U/L (ref 0–44)
AST: 18 U/L (ref 15–41)
Albumin: 3.1 g/dL — ABNORMAL LOW (ref 3.5–5.0)
Alkaline Phosphatase: 73 U/L (ref 38–126)
Anion gap: 8 (ref 5–15)
BUN: 16 mg/dL (ref 8–23)
CO2: 25 mmol/L (ref 22–32)
Calcium: 7.8 mg/dL — ABNORMAL LOW (ref 8.9–10.3)
Chloride: 107 mmol/L (ref 98–111)
Creatinine: 1.18 mg/dL — ABNORMAL HIGH (ref 0.44–1.00)
GFR, Est AFR Am: 47 mL/min — ABNORMAL LOW (ref >60)
GFR, Estimated: 41 mL/min — ABNORMAL LOW (ref >60)
Glucose, Bld: 115 mg/dL — ABNORMAL HIGH (ref 70–99)
Potassium: 4.1 mmol/L (ref 3.5–5.1)
Sodium: 140 mmol/L (ref 135–145)
Total Bilirubin: 0.2 mg/dL — ABNORMAL LOW (ref 0.3–1.2)
Total Protein: 6.7 g/dL (ref 6.5–8.1)

## 2018-09-12 MED ORDER — PACLITAXEL PROTEIN-BOUND CHEMO INJECTION 100 MG
100.0000 mg/m2 | Freq: Once | INTRAVENOUS | Status: AC
  Administered 2018-09-12: 175 mg via INTRAVENOUS
  Filled 2018-09-12: qty 35

## 2018-09-12 MED ORDER — SODIUM CHLORIDE 0.9% FLUSH
10.0000 mL | INTRAVENOUS | Status: DC | PRN
  Administered 2018-09-12: 10 mL
  Filled 2018-09-12: qty 10

## 2018-09-12 MED ORDER — SODIUM CHLORIDE 0.9 % IV SOLN
800.0000 mg/m2 | Freq: Once | INTRAVENOUS | Status: AC
  Administered 2018-09-12: 15:00:00 1330 mg via INTRAVENOUS
  Filled 2018-09-12: qty 34.98

## 2018-09-12 MED ORDER — SODIUM CHLORIDE 0.9 % IV SOLN
Freq: Once | INTRAVENOUS | Status: AC
  Administered 2018-09-12: 12:00:00 via INTRAVENOUS
  Filled 2018-09-12: qty 250

## 2018-09-12 MED ORDER — HEPARIN SOD (PORK) LOCK FLUSH 100 UNIT/ML IV SOLN
500.0000 [IU] | Freq: Once | INTRAVENOUS | Status: AC | PRN
  Administered 2018-09-12: 500 [IU]
  Filled 2018-09-12: qty 5

## 2018-09-12 MED ORDER — PROCHLORPERAZINE MALEATE 10 MG PO TABS
5.0000 mg | ORAL_TABLET | Freq: Once | ORAL | Status: AC
  Administered 2018-09-12: 5 mg via ORAL

## 2018-09-12 MED ORDER — PROCHLORPERAZINE MALEATE 10 MG PO TABS
ORAL_TABLET | ORAL | Status: AC
  Filled 2018-09-12: qty 1

## 2018-09-12 NOTE — Progress Notes (Addendum)
  [facility] [facility] OFFICE PROGRESS NOTE   Diagnosis: Cholangiocarcinoma  INTERVAL HISTORY:   [name] returns as scheduled.  She overall feels well.  She describes her appetite is "75%".  No pain.  No nausea vomiting.  No diarrhea.  Bowels moving regularly.  No fever, cough, shortness of breath.  Objective:  Vital signs in last 24 hours:  Blood pressure (!) 140/59, pulse 70, temperature 99.1 F (37.3 C), temperature source Oral, resp. rate 17, height 5' 2" (1.575 m), weight 136 lb 12.8 oz (62.1 kg), SpO2 98 %.    HEENT: No thrush or ulcers. GI: Abdomen is soft, mildly distended.  Mild tenderness at the left abdomen.  No apparent ascites.  No hepatomegaly. Vascular: Trace edema at the lower legs bilaterally. Port-A-Cath without erythema.  Lab Results:  Lab Results  Component Value Date   WBC 9.6 09/12/2018   HGB 10.5 (L) 09/12/2018   HCT 33.2 (L) 09/12/2018   MCV 97.4 09/12/2018   PLT 291 09/12/2018   NEUTROABS 6.5 09/12/2018    Imaging:  No results found.  Medications: I have reviewed the patient's current medications.  Assessment/Plan: 1. Anterior liver mass, ultrasound-guided biopsy 09/22/2017-adenocarcinoma, MSI-stable, tumor mutation burden 0,IDH1 R132G, BAP1 loss ? CT chest 09/07/2017, 4.1 cm hypodense anterior liver mass, changes of chronic hypersensitivity pneumonitis ? MRI abdomen 09/13/2017-isolated liver mass measuring 4.7 x 3.8 cm involving segments 4B and 5, no additional liver masses ? SBRT to the liver mass beginning 10/31/2017, completed 11/10/2017 ? CT abdomen/pelvis 01/23/2018-radiation changes in the liver, liver mass measuring 5.9 x 4.4 x 5.0 cm but no evidence of metastatic disease in the abdomen or pelvis ? MRI abdomen 02/22/2018-dominant central left liver lesion slightly increased in size with decreased central enhancement suggesting necrosis, 2 new lesions including an 11 mm left hepatic lesion and a 9 mm right hepatic lesion ? Xeloda 7  days on/7 days off beginning 03/27/2018 (1500 mg every morning and 1000 mg every afternoon) ? Xeloda dose reduced to 1000 mg every morning and 1000 mg every afternoon beginning 04/24/2018 due to diarrhea ? Xeloda discontinued after an office visit 05/15/2018 secondary to hand/foot syndrome and diarrhea ? CT abdomen/pelvis 06/12/2018-decrease in size of dominant left hepatic mass, no significant change in smaller liver lesions, stable biliary ductal dilatation, diffuse "colitis " ? CT 08/31/2018- decreased size of the segment 5 lesion, increased size of other liver lesions and new lesions, no evidence of extrahepatic metastatic disease, persistent colonic thickening ? Cycle 1 gemcitabine/Abraxane 09/12/2018 2. Abdominal pain 3. History of diastolic heart failure 4. Hypertension 5. G3, P3 6. History of anemia 7. Right foot cellulitis 05/22/2018-Keflex prescribed 05/23/2018 8. C. difficile colitis 06/14/2018-treated with vancomycin, recurrent diarrhea 07/04/2018-second course of vancomycin (prolonged taper) prescribed, persistent diarrhea 08/06/2018-improved after diet change    Disposition: [name] appears stable.  She is scheduled to complete cycle 1 gemcitabine/Abraxane today.  We again reviewed potential toxicities.  She agrees to proceed.  We reviewed the CBC from today.  Counts are adequate for treatment.  She will return for lab, follow-up, cycle 2 gemcitabine/Abraxane in 2 weeks.  She will contact the office in the interim with any problems.  Patient seen with Dr. Benay Spice.  Ned Card ANP/GNP-BC   09/12/2018  11:35 AM  This was a shared visit with Ned Card.  Ms. Kashani underwent Port-A-Cath placement and will complete cycle 1 gemcitabine/Abraxane today.  Julieanne Manson, MD

## 2018-09-12 NOTE — Patient Instructions (Addendum)
[facility] Discharge Instructions for Patients Receiving Chemotherapy  Today you received the following chemotherapy agents Gemcitabine (GEMZAR) & Paclitaxel-protein (ABRAXANE).  To help prevent nausea and vomiting after your treatment, we encourage you to take your nausea medication as prescribed.  If you develop nausea and vomiting that is not controlled by your nausea medication, call the clinic.   BELOW ARE SYMPTOMS THAT SHOULD BE REPORTED IMMEDIATELY:  *FEVER GREATER THAN 100.5 F  *CHILLS WITH OR WITHOUT FEVER  NAUSEA AND VOMITING THAT IS NOT CONTROLLED WITH YOUR NAUSEA MEDICATION  *UNUSUAL SHORTNESS OF BREATH  *UNUSUAL BRUISING OR BLEEDING  TENDERNESS IN MOUTH AND THROAT WITH OR WITHOUT PRESENCE OF ULCERS  *URINARY PROBLEMS  *BOWEL PROBLEMS  UNUSUAL RASH Items with * indicate a potential emergency and should be followed up as soon as possible.  Feel free to call the clinic should you have any questions or concerns. The clinic phone number is [phone].  Please show the [facility] at check-in to the Emergency Department and triage nurse.  Gemcitabine injection What is this medicine? GEMCITABINE (jem SYE ta been) is a chemotherapy drug. This medicine is used to treat many types of cancer like breast cancer, lung cancer, pancreatic cancer, and ovarian cancer. This medicine may be used for other purposes; ask your health care provider or pharmacist if you have questions. COMMON BRAND NAME(S): Gemzar, Infugem What should I tell my health care provider before I take this medicine? They need to know if you have any of these conditions:  blood disorders  infection  kidney disease  liver disease  lung or breathing disease, like asthma  recent or ongoing radiation therapy  an unusual or allergic reaction to gemcitabine, other chemotherapy, other medicines, foods, dyes, or preservatives  pregnant or trying to get  pregnant  breast-feeding How should I use this medicine? This drug is given as an infusion into a vein. It is administered in a hospital or clinic by a specially trained health care professional. Talk to your pediatrician regarding the use of this medicine in children. Special care may be needed. Overdosage: If you think you have taken too much of this medicine contact a poison control center or emergency room at once. NOTE: This medicine is only for you. Do not share this medicine with others. What if I miss a dose? It is important not to miss your dose. Call your doctor or health care professional if you are unable to keep an appointment. What may interact with this medicine?  medicines to increase blood counts like filgrastim, pegfilgrastim, sargramostim  some other chemotherapy drugs like cisplatin  vaccines Talk to your doctor or health care professional before taking any of these medicines:  acetaminophen  aspirin  ibuprofen  ketoprofen  naproxen This list may not describe all possible interactions. Give your health care provider a list of all the medicines, herbs, non-prescription drugs, or dietary supplements you use. Also tell them if you smoke, drink alcohol, or use illegal drugs. Some items may interact with your medicine. What should I watch for while using this medicine? Visit your doctor for checks on your progress. This drug may make you feel generally unwell. This is not uncommon, as chemotherapy can affect healthy cells as well as cancer cells. Report any side effects. Continue your course of treatment even though you feel ill unless your doctor tells you to stop. In some cases, you may be given additional medicines to help with side effects. Follow all directions for  their use. Call your doctor or health care professional for advice if you get a fever, chills or sore throat, or other symptoms of a cold or flu. Do not treat yourself. This drug decreases your body's  ability to fight infections. Try to avoid being around people who are sick. This medicine may increase your risk to bruise or bleed. Call your doctor or health care professional if you notice any unusual bleeding. Be careful brushing and flossing your teeth or using a toothpick because you may get an infection or bleed more easily. If you have any dental work done, tell your dentist you are receiving this medicine. Avoid taking products that contain aspirin, acetaminophen, ibuprofen, naproxen, or ketoprofen unless instructed by your doctor. These medicines may hide a fever. Do not become pregnant while taking this medicine or for 6 months after stopping it. Women should inform their doctor if they wish to become pregnant or think they might be pregnant. Men should not father a child while taking this medicine and for 3 months after stopping it. There is a potential for serious side effects to an unborn child. Talk to your health care professional or pharmacist for more information. Do not breast-feed an infant while taking this medicine or for at least 1 week after stopping it. Men should inform their doctors if they wish to father a child. This medicine may lower sperm counts. Talk with your doctor or health care professional if you are concerned about your fertility. What side effects may I notice from receiving this medicine? Side effects that you should report to your doctor or health care professional as soon as possible:  allergic reactions like skin rash, itching or hives, swelling of the face, lips, or tongue  breathing problems  pain, redness, or irritation at site where injected  signs and symptoms of a dangerous change in heartbeat or heart rhythm like chest pain; dizziness; fast or irregular heartbeat; palpitations; feeling faint or lightheaded, falls; breathing problems  signs of decreased platelets or bleeding - bruising, pinpoint red spots on the skin, black, tarry stools, blood in  the urine  signs of decreased red blood cells - unusually weak or tired, feeling faint or lightheaded, falls  signs of infection - fever or chills, cough, sore throat, pain or difficulty passing urine  signs and symptoms of kidney injury like trouble passing urine or change in the amount of urine  signs and symptoms of liver injury like dark yellow or brown urine; general ill feeling or flu-like symptoms; light-colored stools; loss of appetite; nausea; right upper belly pain; unusually weak or tired; yellowing of the eyes or skin  swelling of ankles, feet, hands Side effects that usually do not require medical attention (report to your doctor or health care professional if they continue or are bothersome):  constipation  diarrhea  hair loss  loss of appetite  nausea  rash  vomiting This list may not describe all possible side effects. Call your doctor for medical advice about side effects. You may report side effects to FDA at 1-800-FDA-1088. Where should I keep my medicine? This drug is given in a hospital or clinic and will not be stored at home. NOTE: This sheet is a summary. It may not cover all possible information. If you have questions about this medicine, talk to your doctor, pharmacist, or health care provider.  2020 Elsevier/Gold Standard (2017-05-18 18:06:11)  Nanoparticle Albumin-Bound Paclitaxel injection What is this medicine? NANOPARTICLE ALBUMIN-BOUND PACLITAXEL (Na no PAHR ti kuhl al  BYOO muhn-bound PAK li TAX el) is a chemotherapy drug. It targets fast dividing cells, like cancer cells, and causes these cells to die. This medicine is used to treat advanced breast cancer, lung cancer, and pancreatic cancer. This medicine may be used for other purposes; ask your health care provider or pharmacist if you have questions. COMMON BRAND NAME(S): Abraxane What should I tell my health care provider before I take this medicine? They need to know if you have any of these  conditions:  kidney disease  liver disease  low blood counts, like low white cell, platelet, or red cell counts  lung or breathing disease, like asthma  tingling of the fingers or toes, or other nerve disorder  an unusual or allergic reaction to paclitaxel, albumin, other chemotherapy, other medicines, foods, dyes, or preservatives  pregnant or trying to get pregnant  breast-feeding How should I use this medicine? This drug is given as an infusion into a vein. It is administered in a hospital or clinic by a specially trained health care professional. Talk to your pediatrician regarding the use of this medicine in children. Special care may be needed. Overdosage: If you think you have taken too much of this medicine contact a poison control center or emergency room at once. NOTE: This medicine is only for you. Do not share this medicine with others. What if I miss a dose? It is important not to miss your dose. Call your doctor or health care professional if you are unable to keep an appointment. What may interact with this medicine? This medicine may interact with the following medications:  antiviral medicines for hepatitis, HIV or AIDS  certain antibiotics like erythromycin and clarithromycin  certain medicines for fungal infections like ketoconazole and itraconazole  certain medicines for seizures like carbamazepine, phenobarbital, phenytoin  gemfibrozil  nefazodone  rifampin  St. John's wort This list may not describe all possible interactions. Give your health care provider a list of all the medicines, herbs, non-prescription drugs, or dietary supplements you use. Also tell them if you smoke, drink alcohol, or use illegal drugs. Some items may interact with your medicine. What should I watch for while using this medicine? Your condition will be monitored carefully while you are receiving this medicine. You will need important blood work done while you are taking this  medicine. This medicine can cause serious allergic reactions. If you experience allergic reactions like skin rash, itching or hives, swelling of the face, lips, or tongue, tell your doctor or health care professional right away. In some cases, you may be given additional medicines to help with side effects. Follow all directions for their use. This drug may make you feel generally unwell. This is not uncommon, as chemotherapy can affect healthy cells as well as cancer cells. Report any side effects. Continue your course of treatment even though you feel ill unless your doctor tells you to stop. Call your doctor or health care professional for advice if you get a fever, chills or sore throat, or other symptoms of a cold or flu. Do not treat yourself. This drug decreases your body's ability to fight infections. Try to avoid being around people who are sick. This medicine may increase your risk to bruise or bleed. Call your doctor or health care professional if you notice any unusual bleeding. Be careful brushing and flossing your teeth or using a toothpick because you may get an infection or bleed more easily. If you have any dental work done, tell your  dentist you are receiving this medicine. Avoid taking products that contain aspirin, acetaminophen, ibuprofen, naproxen, or ketoprofen unless instructed by your doctor. These medicines may hide a fever. Do not become pregnant while taking this medicine or for 6 months after stopping it. Women should inform their doctor if they wish to become pregnant or think they might be pregnant. Men should not father a child while taking this medicine or for 3 months after stopping it. There is a potential for serious side effects to an unborn child. Talk to your health care professional or pharmacist for more information. Do not breast-feed an infant while taking this medicine or for 2 weeks after stopping it. This medicine may interfere with the ability to get pregnant  or to father a child. You should talk to your doctor or health care professional if you are concerned about your fertility. What side effects may I notice from receiving this medicine? Side effects that you should report to your doctor or health care professional as soon as possible:  allergic reactions like skin rash, itching or hives, swelling of the face, lips, or tongue  breathing problems  changes in vision  fast, irregular heartbeat  low blood pressure  mouth sores  pain, tingling, numbness in the hands or feet  signs of decreased platelets or bleeding - bruising, pinpoint red spots on the skin, black, tarry stools, blood in the urine  signs of decreased red blood cells - unusually weak or tired, feeling faint or lightheaded, falls  signs of infection - fever or chills, cough, sore throat, pain or difficulty passing urine  signs and symptoms of liver injury like dark yellow or brown urine; general ill feeling or flu-like symptoms; light-colored stools; loss of appetite; nausea; right upper belly pain; unusually weak or tired; yellowing of the eyes or skin  swelling of the ankles, feet, hands  unusually slow heartbeat Side effects that usually do not require medical attention (report to your doctor or health care professional if they continue or are bothersome):  diarrhea  hair loss  loss of appetite  nausea, vomiting  tiredness This list may not describe all possible side effects. Call your doctor for medical advice about side effects. You may report side effects to FDA at 1-800-FDA-1088. Where should I keep my medicine? This drug is given in a hospital or clinic and will not be stored at home. NOTE: This sheet is a summary. It may not cover all possible information. If you have questions about this medicine, talk to your doctor, pharmacist, or health care provider.  2020 Elsevier/Gold Standard (2016-10-26 13:03:45)  Coronavirus (COVID-19) Are you at risk?  Are  you at risk for the Coronavirus (COVID-19)?  To be considered HIGH RISK for Coronavirus (COVID-19), you have to meet the following criteria:  . Traveled to [place], [place], [place], [place] or [place]; or in the [place] to [place], [place], [place], or [place]; and have fever, cough, and shortness of breath within the last 2 weeks of travel OR . Been in close contact with a person diagnosed with COVID-19 within the last 2 weeks and have fever, cough, and shortness of breath . IF YOU DO NOT MEET THESE CRITERIA, YOU ARE CONSIDERED LOW RISK FOR COVID-19.  What to do if you are HIGH RISK for COVID-19?  Marland Kitchen If you are having a medical emergency, call 911. . Seek medical care right away. Before you go to a doctor's office, urgent care or emergency department, call ahead  and tell them about your recent travel, contact with someone diagnosed with COVID-19, and your symptoms. You should receive instructions from your physician's office regarding next steps of care.  . When you arrive at healthcare provider, tell the healthcare staff immediately you have returned from visiting [place], [place], [place], [place] or [place]; or traveled in the [place] to [place], [place], [place], or [place]; in the last two weeks or you have been in close contact with a person diagnosed with COVID-19 in the last 2 weeks.   . Tell the health care staff about your symptoms: fever, cough and shortness of breath. . After you have been seen by a medical provider, you will be either: o Tested for (COVID-19) and discharged home on quarantine except to seek medical care if symptoms worsen, and asked to  - Stay home and avoid contact with others until you get your results (4-5 days)  - Avoid travel on public transportation if possible (such as bus, train, or airplane) or o Sent to the Emergency Department by EMS for evaluation, COVID-19 testing, and possible admission depending on your condition and test  results.  What to do if you are LOW RISK for COVID-19?  Reduce your risk of any infection by using the same precautions used for avoiding the common cold or flu:  Marland Kitchen Wash your hands often with soap and warm water for at least 20 seconds.  If soap and water are not readily available, use an alcohol-based hand sanitizer with at least 60% alcohol.  . If coughing or sneezing, cover your mouth and nose by coughing or sneezing into the elbow areas of your shirt or coat, into a tissue or into your sleeve (not your hands). . Avoid shaking hands with others and consider head nods or verbal greetings only. . Avoid touching your eyes, nose, or mouth with unwashed hands.  . Avoid close contact with people who are sick. . Avoid places or events with large numbers of people in one location, like concerts or sporting events. . Carefully consider travel plans you have or are making. . If you are planning any travel outside or inside the [place], visit the CDC's Travelers' Health webpage for the latest health notices. . If you have some symptoms but not all symptoms, continue to monitor at home and seek medical attention if your symptoms worsen. . If you are having a medical emergency, call 911.   [facility] / e-Visit: [email or web address]         [facility]: [facility]: [phone]                   [facility]: [phone]

## 2018-09-12 NOTE — Patient Instructions (Signed)
Tunneled Central Venous Catheter Flushing Guide  It is important to flush your tunneled central venous catheter each time you use it, both before and after you use it. Flushing your catheter will help prevent it from clogging. What are the risks? Risks may include:  Infection.  Air getting into the catheter and bloodstream. Supplies needed:  A clean pair of gloves.  A disinfecting wipe. Use an alcohol wipe, chlorhexidine wipe, or iodine wipe as told by your health care provider.  A 10 mL syringe that has been prefilled with saline solution.  An empty 10 mL syringe, if a substance called heparin was injected into your catheter. How to flush your catheter When you flush your catheter, make sure you follow any specific instructions from your health care provider or the manufacturer. These are general guidelines. Flushing your catheter before use If there is heparin in your catheter: 1. Wash your hands with soap and water. 2. Put on gloves. 3. Scrub the injection cap for a minimum of 15 seconds with a disinfecting wipe. 4. Unclamp the catheter. 5. Attach the empty syringe to the injection cap. 6. Pull the syringe plunger back and withdraw 10 mL of blood. 7. Place the syringe into an appropriate waste container. 8. Scrub the injection cap for 15 seconds with a disinfecting wipe. 9. Attach the prefilled syringe to the injection cap. 10. Flush the catheter by pushing the plunger forward until all the liquid from the syringe is in the catheter. 11. Remove the syringe from the injection cap. 12. Clamp the catheter. If there is no heparin in your catheter: 1. Wash your hands with soap and water. 2. Put on gloves. 3. Scrub the injection cap for 15 seconds with a disinfecting wipe. 4. Unclamp the catheter. 5. Attach the prefilled syringe to the injection cap. 6. Flush the catheter by pushing the plunger forward until 5 mL of the liquid from the syringe is in the catheter. 7. Pull back on  the syringe until you see blood in the catheter. 8. If you have been asked to collect any blood, follow your health care provider's instructions. Otherwise, flush the catheter with the rest of the solution from the syringe. 9. Remove the syringe from the injection cap. 10. Clamp the catheter.  Flushing your catheter after use 1. Wash your hands with soap and water. 2. Put on gloves. 3. Scrub the injection cap for 15 seconds with a disinfecting wipe. 4. Unclamp the catheter. 5. Attach the prefilled syringe to the injection cap. 6. Flush the catheter by pushing the plunger forward until all of the liquid from the syringe is in the catheter. 7. Remove the syringe from the injection cap. 8. Clamp the catheter. Problems and solutions  If blood cannot be completely cleared from the injection cap, you may need to have the injection cap replaced.  If the catheter is difficult to flush, use the pulsing method. The pulsing method involves pushing only a few milliliters of solution into the catheter at a time and pausing between pushes.  If you do not see blood in the catheter when you pull back on the syringe, change your body position, such as by raising your arms above your head. Take a deep breath and cough. Then, pull back on the syringe. If you still do not see blood, flush the catheter with a small amount of solution. Then, change positions again and take a breath or cough. Pull back on the syringe again. If you still do not see  blood, finish flushing the catheter and contact your health care provider. Do not use your catheter until your health care provider says it is okay. General tips  Have someone help you flush your catheter, if possible.  Do not force fluid through your catheter.  Do not use a syringe that is larger or smaller than 10 mL. Using a smaller syringe can make the catheter burst.  Do not use your catheter without flushing it first if it has heparin in it. Contact a health  care provider if:  You cannot see any blood in the catheter when you flush it before using it.  Your catheter is difficult to flush. Get help right away if:  You cannot flush the catheter.  The catheter leaks when you flush it or when there is fluid in it.  There are cracks or breaks in the catheter. Summary  It is important to flush your tunneled central venous catheter each time you use it, both before and after you use it.  Scrub the injection cap for 15 seconds with a disinfecting wipe before and after you flush it.  When you flush your catheter, make sure you follow any specific instructions from your health care provider or the manufacturer.  Get help right away if you cannot flush the catheter. This information is not intended to replace advice given to you by your health care provider. Make sure you discuss any questions you have with your health care provider. Document Released: 02/11/2011 Document Revised: 05/10/2018 Document Reviewed: 05/10/2018 Elsevier Patient Education  [place].

## 2018-09-13 ENCOUNTER — Telehealth: Payer: Self-pay | Admitting: *Deleted

## 2018-09-13 ENCOUNTER — Telehealth: Payer: Self-pay | Admitting: Nurse Practitioner

## 2018-09-13 LAB — CANCER ANTIGEN 19-9: CA 19-9: 44 U/mL — ABNORMAL HIGH (ref 0–35)

## 2018-09-13 NOTE — Telephone Encounter (Signed)
Called [facility] for chemotherapy F/U.  Patient is doing well; "Just not running any races.  Slept well but tired.  Lying down at times."  Denies n/v.  Denies any new side effects or symptoms.  "Bowels moved this morning about 11:00 am."  Bladder functioning well.  Eating and drinking well.  "Have not drank as much water yet."  Instructed to drink 64 oz minimum daily or at least the day before, of and after treatment.   Denies questions or needs at this time.  Encouraged to call [phone] Mon -Fri 8:00 am - 4:30 pm or anytime as needed for symptoms, changes or event outside office hours.

## 2018-09-13 NOTE — Telephone Encounter (Signed)
Scheduled per los. Mailed printout

## 2018-09-24 ENCOUNTER — Other Ambulatory Visit: Payer: Self-pay | Admitting: Oncology

## 2018-09-27 ENCOUNTER — Inpatient Hospital Stay: Payer: Medicare Other

## 2018-09-27 ENCOUNTER — Inpatient Hospital Stay (HOSPITAL_BASED_OUTPATIENT_CLINIC_OR_DEPARTMENT_OTHER): Payer: Medicare Other | Admitting: Oncology

## 2018-09-27 ENCOUNTER — Other Ambulatory Visit: Payer: Self-pay

## 2018-09-27 ENCOUNTER — Encounter: Payer: Self-pay | Admitting: Oncology

## 2018-09-27 VITALS — BP 139/52 | HR 60 | Temp 98.5°F | Resp 18 | Ht 62.0 in | Wt 135.7 lb

## 2018-09-27 DIAGNOSIS — C221 Intrahepatic bile duct carcinoma: Secondary | ICD-10-CM | POA: Diagnosis not present

## 2018-09-27 DIAGNOSIS — D649 Anemia, unspecified: Secondary | ICD-10-CM | POA: Diagnosis not present

## 2018-09-27 DIAGNOSIS — Z5112 Encounter for antineoplastic immunotherapy: Secondary | ICD-10-CM | POA: Diagnosis not present

## 2018-09-27 DIAGNOSIS — I10 Essential (primary) hypertension: Secondary | ICD-10-CM | POA: Diagnosis not present

## 2018-09-27 LAB — CMP (CANCER CENTER ONLY)
ALT: 17 U/L (ref 0–44)
AST: 19 U/L (ref 15–41)
Albumin: 3.4 g/dL — ABNORMAL LOW (ref 3.5–5.0)
Alkaline Phosphatase: 68 U/L (ref 38–126)
Anion gap: 9 (ref 5–15)
BUN: 20 mg/dL (ref 8–23)
CO2: 25 mmol/L (ref 22–32)
Calcium: 9 mg/dL (ref 8.9–10.3)
Chloride: 106 mmol/L (ref 98–111)
Creatinine: 1.31 mg/dL — ABNORMAL HIGH (ref 0.44–1.00)
GFR, Est AFR Am: 42 mL/min — ABNORMAL LOW (ref >60)
GFR, Estimated: 36 mL/min — ABNORMAL LOW (ref >60)
Glucose, Bld: 113 mg/dL — ABNORMAL HIGH (ref 70–99)
Potassium: 4.5 mmol/L (ref 3.5–5.1)
Sodium: 140 mmol/L (ref 135–145)
Total Bilirubin: 0.4 mg/dL (ref 0.3–1.2)
Total Protein: 6.8 g/dL (ref 6.5–8.1)

## 2018-09-27 LAB — CBC WITH DIFFERENTIAL (CANCER CENTER ONLY)
Abs Immature Granulocytes: 0.01 10*3/uL (ref 0.00–0.07)
Basophils Absolute: 0 10*3/uL (ref 0.0–0.1)
Basophils Relative: 1 %
Eosinophils Absolute: 0.5 10*3/uL (ref 0.0–0.5)
Eosinophils Relative: 7 %
HCT: 32.3 % — ABNORMAL LOW (ref 36.0–46.0)
Hemoglobin: 10.2 g/dL — ABNORMAL LOW (ref 12.0–15.0)
Immature Granulocytes: 0 %
Lymphocytes Relative: 22 %
Lymphs Abs: 1.5 10*3/uL (ref 0.7–4.0)
MCH: 30.7 pg (ref 26.0–34.0)
MCHC: 31.6 g/dL (ref 30.0–36.0)
MCV: 97.3 fL (ref 80.0–100.0)
Monocytes Absolute: 0.9 10*3/uL (ref 0.1–1.0)
Monocytes Relative: 14 %
Neutro Abs: 3.7 10*3/uL (ref 1.7–7.7)
Neutrophils Relative %: 56 %
Platelet Count: 275 10*3/uL (ref 150–400)
RBC: 3.32 MIL/uL — ABNORMAL LOW (ref 3.87–5.11)
RDW: 16.1 % — ABNORMAL HIGH (ref 11.5–15.5)
WBC Count: 6.6 10*3/uL (ref 4.0–10.5)
nRBC: 0 % (ref 0.0–0.2)

## 2018-09-27 MED ORDER — HEPARIN SOD (PORK) LOCK FLUSH 100 UNIT/ML IV SOLN
500.0000 [IU] | Freq: Once | INTRAVENOUS | Status: AC | PRN
  Administered 2018-09-27: 16:00:00 500 [IU]
  Filled 2018-09-27: qty 5

## 2018-09-27 MED ORDER — PROCHLORPERAZINE MALEATE 10 MG PO TABS
5.0000 mg | ORAL_TABLET | Freq: Once | ORAL | Status: AC
  Administered 2018-09-27: 5 mg via ORAL

## 2018-09-27 MED ORDER — PROCHLORPERAZINE MALEATE 10 MG PO TABS
ORAL_TABLET | ORAL | Status: AC
  Filled 2018-09-27: qty 1

## 2018-09-27 MED ORDER — SODIUM CHLORIDE 0.9 % IV SOLN
Freq: Once | INTRAVENOUS | Status: AC
  Administered 2018-09-27: 13:00:00 via INTRAVENOUS
  Filled 2018-09-27: qty 250

## 2018-09-27 MED ORDER — SODIUM CHLORIDE 0.9 % IV SOLN
800.0000 mg/m2 | Freq: Once | INTRAVENOUS | Status: AC
  Administered 2018-09-27: 1330 mg via INTRAVENOUS
  Filled 2018-09-27: qty 34.98

## 2018-09-27 MED ORDER — SODIUM CHLORIDE 0.9% FLUSH
10.0000 mL | INTRAVENOUS | Status: DC | PRN
  Administered 2018-09-27: 10 mL
  Filled 2018-09-27: qty 10

## 2018-09-27 MED ORDER — PACLITAXEL PROTEIN-BOUND CHEMO INJECTION 100 MG
100.0000 mg/m2 | Freq: Once | INTRAVENOUS | Status: AC
  Administered 2018-09-27: 175 mg via INTRAVENOUS
  Filled 2018-09-27: qty 35

## 2018-09-27 NOTE — Progress Notes (Signed)
[name] OFFICE PROGRESS NOTE   Diagnosis: Cholangiocarcinoma  INTERVAL HISTORY:   Summer Nielsen completed cycle 1 gemcitabine/Abraxane on 09/12/2018.  No nausea, rash, or neuropathy symptoms.  She reports pruritus for the past month.  Diarrhea has resolved.  No abdominal pain.  She has noted mild hair loss.  Objective:  Vital signs in last 24 hours:  Blood pressure (!) 139/52, pulse 60, temperature 98.5 F (36.9 C), temperature source Oral, resp. rate 18, height _0  (1.575 m), weight 135 lb 11.2 oz (61.6 kg), SpO2 94 %. Limited physical examination secondary to distancing with the COVID pandemic GI: No hepatosplenomegaly, nontender, no mass Vascular: Trace lower leg edema bilaterally    Portacath/PICC-without erythema  Lab Results:  Lab Results  Component Value Date   WBC 6.6 09/27/2018   HGB 10.2 (L) 09/27/2018   HCT 32.3 (L) 09/27/2018   MCV 97.3 09/27/2018   PLT 275 09/27/2018   NEUTROABS 3.7 09/27/2018    CMP  Lab Results  Component Value Date   NA 140 09/27/2018   K 4.5 09/27/2018   CL 106 09/27/2018   CO2 25 09/27/2018   GLUCOSE 113 (H) 09/27/2018   BUN 20 09/27/2018   CREATININE 1.31 (H) 09/27/2018   CALCIUM 9.0 09/27/2018   PROT 6.8 09/27/2018   ALBUMIN 3.4 (L) 09/27/2018   AST 19 09/27/2018   ALT 17 09/27/2018   ALKPHOS 68 09/27/2018   BILITOT 0.4 09/27/2018   GFRNONAA 36 (L) 09/27/2018   GFRAA 42 (L) 09/27/2018    Lab Results  Component Value Date   CEA1 1.96 03/21/2018    Medications: I have reviewed the patient's current medications.   Assessment/Plan: 1. Anterior liver mass, ultrasound-guided biopsy 09/22/2017-adenocarcinoma, MSI-stable, tumor mutation burden 0,IDH1 R132G, BAP1 loss ? CT chest 09/07/2017, 4.1 cm hypodense anterior liver mass, changes of chronic hypersensitivity pneumonitis ? MRI abdomen 09/13/2017-isolated liver mass measuring 4.7 x 3.8 cm involving segments 4B and 5, no additional liver masses ? SBRT to  the liver mass beginning 10/31/2017, completed 11/10/2017 ? CT abdomen/pelvis 01/23/2018-radiation changes in the liver, liver mass measuring 5.9 x 4.4 x 5.0 cm but no evidence of metastatic disease in the abdomen or pelvis ? MRI abdomen 02/22/2018-dominant central left liver lesion slightly increased in size with decreased central enhancement suggesting necrosis, 2 new lesions including an 11 mm left hepatic lesion and a 9 mm right hepatic lesion ? Xeloda 7 days on/7 days off beginning 03/27/2018 (1500 mg every morning and 1000 mg every afternoon) ? Xeloda dose reduced to 1000 mg every morning and 1000 mg every afternoon beginning 04/24/2018 due to diarrhea ? Xeloda discontinued after an office visit 05/15/2018 secondary to hand/foot syndrome and diarrhea ? CT abdomen/pelvis 06/12/2018-decrease in size of dominant left hepatic mass, no significant change in smaller liver lesions, stable biliary ductal dilatation, diffuse "colitis " ? CT 08/31/2018- decreased size of the segment 5 lesion, increased size of other liver lesions and new lesions, no evidence of extrahepatic metastatic disease, persistent colonic thickening ? Cycle 1 gemcitabine/Abraxane 09/12/2018 ? Cycle 2 gemcitabine/Abraxane 09/27/2018 2. Abdominal pain 3. History of diastolic heart failure 4. Hypertension 5. G3, P3 6. History of anemia 7. Right foot cellulitis 05/22/2018-Keflex prescribed 05/23/2018 8. C. difficile colitis 06/14/2018-treated with vancomycin, recurrent diarrhea 07/04/2018-second course of vancomycin (prolonged taper) prescribed, persistent diarrhea 08/06/2018-improved after diet change     Disposition: Summer Nielsen tolerated the first treatment with gemcitabine/Abraxane well.  She will complete cycle 2 today.  She will return for  office visit and chemotherapy in 2 weeks.  Betsy Coder, MD  09/27/2018  12:04 PM

## 2018-09-27 NOTE — Patient Instructions (Signed)
Tunneled Central Venous Catheter Flushing Guide  It is important to flush your tunneled central venous catheter each time you use it, both before and after you use it. Flushing your catheter will help prevent it from clogging. What are the risks? Risks may include:  Infection.  Air getting into the catheter and bloodstream. Supplies needed:  A clean pair of gloves.  A disinfecting wipe. Use an alcohol wipe, chlorhexidine wipe, or iodine wipe as told by your health care provider.  A 10 mL syringe that has been prefilled with saline solution.  An empty 10 mL syringe, if a substance called heparin was injected into your catheter. How to flush your catheter When you flush your catheter, make sure you follow any specific instructions from your health care provider or the manufacturer. These are general guidelines. Flushing your catheter before use If there is heparin in your catheter: 1. Wash your hands with soap and water. 2. Put on gloves. 3. Scrub the injection cap for a minimum of 15 seconds with a disinfecting wipe. 4. Unclamp the catheter. 5. Attach the empty syringe to the injection cap. 6. Pull the syringe plunger back and withdraw 10 mL of blood. 7. Place the syringe into an appropriate waste container. 8. Scrub the injection cap for 15 seconds with a disinfecting wipe. 9. Attach the prefilled syringe to the injection cap. 10. Flush the catheter by pushing the plunger forward until all the liquid from the syringe is in the catheter. 11. Remove the syringe from the injection cap. 12. Clamp the catheter. If there is no heparin in your catheter: 1. Wash your hands with soap and water. 2. Put on gloves. 3. Scrub the injection cap for 15 seconds with a disinfecting wipe. 4. Unclamp the catheter. 5. Attach the prefilled syringe to the injection cap. 6. Flush the catheter by pushing the plunger forward until 5 mL of the liquid from the syringe is in the catheter. 7. Pull back on  the syringe until you see blood in the catheter. 8. If you have been asked to collect any blood, follow your health care provider's instructions. Otherwise, flush the catheter with the rest of the solution from the syringe. 9. Remove the syringe from the injection cap. 10. Clamp the catheter.  Flushing your catheter after use 1. Wash your hands with soap and water. 2. Put on gloves. 3. Scrub the injection cap for 15 seconds with a disinfecting wipe. 4. Unclamp the catheter. 5. Attach the prefilled syringe to the injection cap. 6. Flush the catheter by pushing the plunger forward until all of the liquid from the syringe is in the catheter. 7. Remove the syringe from the injection cap. 8. Clamp the catheter. Problems and solutions  If blood cannot be completely cleared from the injection cap, you may need to have the injection cap replaced.  If the catheter is difficult to flush, use the pulsing method. The pulsing method involves pushing only a few milliliters of solution into the catheter at a time and pausing between pushes.  If you do not see blood in the catheter when you pull back on the syringe, change your body position, such as by raising your arms above your head. Take a deep breath and cough. Then, pull back on the syringe. If you still do not see blood, flush the catheter with a small amount of solution. Then, change positions again and take a breath or cough. Pull back on the syringe again. If you still do not see  blood, finish flushing the catheter and contact your health care provider. Do not use your catheter until your health care provider says it is okay. General tips  Have someone help you flush your catheter, if possible.  Do not force fluid through your catheter.  Do not use a syringe that is larger or smaller than 10 mL. Using a smaller syringe can make the catheter burst.  Do not use your catheter without flushing it first if it has heparin in it. Contact a health  care provider if:  You cannot see any blood in the catheter when you flush it before using it.  Your catheter is difficult to flush. Get help right away if:  You cannot flush the catheter.  The catheter leaks when you flush it or when there is fluid in it.  There are cracks or breaks in the catheter. Summary  It is important to flush your tunneled central venous catheter each time you use it, both before and after you use it.  Scrub the injection cap for 15 seconds with a disinfecting wipe before and after you flush it.  When you flush your catheter, make sure you follow any specific instructions from your health care provider or the manufacturer.  Get help right away if you cannot flush the catheter. This information is not intended to replace advice given to you by your health care provider. Make sure you discuss any questions you have with your health care provider. Document Released: 02/11/2011 Document Revised: 05/10/2018 Document Reviewed: 05/10/2018 Elsevier Patient Education  [place].

## 2018-09-27 NOTE — Patient Instructions (Signed)
[facility] Discharge Instructions for Patients Receiving Chemotherapy  Today you received the following chemotherapy agents: Abraxane and Gemzar.  To help prevent nausea and vomiting after your treatment, we encourage you to take your nausea medication as directed.    If you develop nausea and vomiting that is not controlled by your nausea medication, call the clinic.   BELOW ARE SYMPTOMS THAT SHOULD BE REPORTED IMMEDIATELY:  *FEVER GREATER THAN 100.5 F  *CHILLS WITH OR WITHOUT FEVER  NAUSEA AND VOMITING THAT IS NOT CONTROLLED WITH YOUR NAUSEA MEDICATION  *UNUSUAL SHORTNESS OF BREATH  *UNUSUAL BRUISING OR BLEEDING  TENDERNESS IN MOUTH AND THROAT WITH OR WITHOUT PRESENCE OF ULCERS  *URINARY PROBLEMS  *BOWEL PROBLEMS  UNUSUAL RASH Items with * indicate a potential emergency and should be followed up as soon as possible.  Feel free to call the clinic should you have any questions or concerns. The clinic phone number is [phone].  Please show the CHEMO ALERT CARD at check-in to the Emergency Department and triage nurse.   

## 2018-09-27 NOTE — Progress Notes (Signed)
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  Gave patient my card if interested in applying and for any additional financial questions or concerns.

## 2018-09-28 ENCOUNTER — Telehealth: Payer: Self-pay | Admitting: Oncology

## 2018-09-28 NOTE — Telephone Encounter (Signed)
Called and spoke with patient. Confirmed appt  °

## 2018-10-06 ENCOUNTER — Telehealth: Payer: Self-pay | Admitting: *Deleted

## 2018-10-06 NOTE — Telephone Encounter (Signed)
Called yesterday that her hair is falling out quickly and asking if there is anything we can do to help. Called back and informed her we can provide a wig script and voucher to use and list of places to obtain wig. This was placed at front for her to pick up.

## 2018-10-08 ENCOUNTER — Other Ambulatory Visit: Payer: Self-pay | Admitting: Oncology

## 2018-10-10 ENCOUNTER — Inpatient Hospital Stay: Payer: Medicare Other

## 2018-10-10 ENCOUNTER — Inpatient Hospital Stay: Payer: Medicare Other | Attending: Oncology | Admitting: Oncology

## 2018-10-10 ENCOUNTER — Other Ambulatory Visit: Payer: Self-pay

## 2018-10-10 VITALS — BP 147/60 | HR 54 | Temp 98.7°F | Resp 17 | Ht 62.0 in | Wt 134.8 lb

## 2018-10-10 DIAGNOSIS — Z85828 Personal history of other malignant neoplasm of skin: Secondary | ICD-10-CM | POA: Diagnosis not present

## 2018-10-10 DIAGNOSIS — I110 Hypertensive heart disease with heart failure: Secondary | ICD-10-CM | POA: Insufficient documentation

## 2018-10-10 DIAGNOSIS — Z95828 Presence of other vascular implants and grafts: Secondary | ICD-10-CM

## 2018-10-10 DIAGNOSIS — D649 Anemia, unspecified: Secondary | ICD-10-CM | POA: Insufficient documentation

## 2018-10-10 DIAGNOSIS — I5032 Chronic diastolic (congestive) heart failure: Secondary | ICD-10-CM | POA: Insufficient documentation

## 2018-10-10 DIAGNOSIS — Z8673 Personal history of transient ischemic attack (TIA), and cerebral infarction without residual deficits: Secondary | ICD-10-CM | POA: Diagnosis not present

## 2018-10-10 DIAGNOSIS — E039 Hypothyroidism, unspecified: Secondary | ICD-10-CM | POA: Insufficient documentation

## 2018-10-10 DIAGNOSIS — C221 Intrahepatic bile duct carcinoma: Secondary | ICD-10-CM | POA: Diagnosis not present

## 2018-10-10 DIAGNOSIS — E785 Hyperlipidemia, unspecified: Secondary | ICD-10-CM | POA: Insufficient documentation

## 2018-10-10 DIAGNOSIS — Z5111 Encounter for antineoplastic chemotherapy: Secondary | ICD-10-CM | POA: Insufficient documentation

## 2018-10-10 DIAGNOSIS — Z79899 Other long term (current) drug therapy: Secondary | ICD-10-CM | POA: Diagnosis not present

## 2018-10-10 LAB — CBC WITH DIFFERENTIAL (CANCER CENTER ONLY)
Abs Immature Granulocytes: 0.01 10*3/uL (ref 0.00–0.07)
Basophils Absolute: 0 10*3/uL (ref 0.0–0.1)
Basophils Relative: 1 %
Eosinophils Absolute: 0.3 10*3/uL (ref 0.0–0.5)
Eosinophils Relative: 5 %
HCT: 32.9 % — ABNORMAL LOW (ref 36.0–46.0)
Hemoglobin: 10.6 g/dL — ABNORMAL LOW (ref 12.0–15.0)
Immature Granulocytes: 0 %
Lymphocytes Relative: 21 %
Lymphs Abs: 1.3 10*3/uL (ref 0.7–4.0)
MCH: 31.4 pg (ref 26.0–34.0)
MCHC: 32.2 g/dL (ref 30.0–36.0)
MCV: 97.3 fL (ref 80.0–100.0)
Monocytes Absolute: 1 10*3/uL (ref 0.1–1.0)
Monocytes Relative: 16 %
Neutro Abs: 3.5 10*3/uL (ref 1.7–7.7)
Neutrophils Relative %: 57 %
Platelet Count: 186 10*3/uL (ref 150–400)
RBC: 3.38 MIL/uL — ABNORMAL LOW (ref 3.87–5.11)
RDW: 16.7 % — ABNORMAL HIGH (ref 11.5–15.5)
WBC Count: 6.1 10*3/uL (ref 4.0–10.5)
nRBC: 0 % (ref 0.0–0.2)

## 2018-10-10 LAB — CMP (CANCER CENTER ONLY)
ALT: 23 U/L (ref 0–44)
AST: 21 U/L (ref 15–41)
Albumin: 3.5 g/dL (ref 3.5–5.0)
Alkaline Phosphatase: 70 U/L (ref 38–126)
Anion gap: 8 (ref 5–15)
BUN: 22 mg/dL (ref 8–23)
CO2: 26 mmol/L (ref 22–32)
Calcium: 9.2 mg/dL (ref 8.9–10.3)
Chloride: 107 mmol/L (ref 98–111)
Creatinine: 1.31 mg/dL — ABNORMAL HIGH (ref 0.44–1.00)
GFR, Est AFR Am: 42 mL/min — ABNORMAL LOW (ref >60)
GFR, Estimated: 36 mL/min — ABNORMAL LOW (ref >60)
Glucose, Bld: 111 mg/dL — ABNORMAL HIGH (ref 70–99)
Potassium: 4.2 mmol/L (ref 3.5–5.1)
Sodium: 141 mmol/L (ref 135–145)
Total Bilirubin: 0.4 mg/dL (ref 0.3–1.2)
Total Protein: 6.9 g/dL (ref 6.5–8.1)

## 2018-10-10 MED ORDER — PROCHLORPERAZINE MALEATE 10 MG PO TABS
ORAL_TABLET | ORAL | Status: AC
  Filled 2018-10-10: qty 1

## 2018-10-10 MED ORDER — SODIUM CHLORIDE 0.9% FLUSH
10.0000 mL | INTRAVENOUS | Status: DC | PRN
  Administered 2018-10-10: 10 mL
  Filled 2018-10-10: qty 10

## 2018-10-10 MED ORDER — SODIUM CHLORIDE 0.9% FLUSH
10.0000 mL | INTRAVENOUS | Status: DC | PRN
  Administered 2018-10-10: 10 mL via INTRAVENOUS
  Filled 2018-10-10: qty 10

## 2018-10-10 MED ORDER — SODIUM CHLORIDE 0.9 % IV SOLN
800.0000 mg/m2 | Freq: Once | INTRAVENOUS | Status: AC
  Administered 2018-10-10: 1330 mg via INTRAVENOUS
  Filled 2018-10-10: qty 34.98

## 2018-10-10 MED ORDER — SODIUM CHLORIDE 0.9 % IV SOLN
Freq: Once | INTRAVENOUS | Status: AC
  Administered 2018-10-10: 11:00:00 via INTRAVENOUS
  Filled 2018-10-10: qty 250

## 2018-10-10 MED ORDER — HEPARIN SOD (PORK) LOCK FLUSH 100 UNIT/ML IV SOLN
500.0000 [IU] | Freq: Once | INTRAVENOUS | Status: AC | PRN
  Administered 2018-10-10: 500 [IU]
  Filled 2018-10-10: qty 5

## 2018-10-10 MED ORDER — PACLITAXEL PROTEIN-BOUND CHEMO INJECTION 100 MG
100.0000 mg/m2 | Freq: Once | INTRAVENOUS | Status: AC
  Administered 2018-10-10: 175 mg via INTRAVENOUS
  Filled 2018-10-10: qty 35

## 2018-10-10 MED ORDER — PROCHLORPERAZINE MALEATE 10 MG PO TABS
5.0000 mg | ORAL_TABLET | Freq: Once | ORAL | Status: AC
  Administered 2018-10-10: 5 mg via ORAL

## 2018-10-10 NOTE — Patient Instructions (Signed)
[employer], Adult A central line is a thin, flexible tube (catheter) that is put in your vein. It can be used to:  Give you medicine.  Give you food and nutrients. Follow these instructions at home: Caring for the tube   Follow instructions from your doctor about: ? Flushing the tube with saline solution. ? Cleaning the tube and the area around it.  Only flush with clean (sterile) supplies. The supplies should be from your doctor, a pharmacy, or another place that your doctor recommends.  Before you flush the tube or clean the area around the tube: ? Wash your hands with soap and water. If you cannot use soap and water, use hand sanitizer. ? Clean the central line hub with rubbing alcohol. Caring for your skin  Keep the area where the tube was put in clean and dry.  Every day, and when changing the bandage, check the skin around the central line for: ? Redness, swelling, or pain. ? Fluid or blood. ? Warmth. ? Pus. ? A bad smell. General instructions  Keep the tube clamped, unless it is being used.  Keep your supplies in a clean, dry location.  If you or someone else accidentally pulls on the tube, make sure: ? The bandage (dressing) is okay. ? There is no bleeding. ? The tube has not been pulled out.  Do not use scissors or sharp objects near the tube.  Do not swim or let the tube soak in a tub.  Ask your doctor what activities are safe for you. Your doctor may tell you not to lift anything or move your arm too much.  Take over-the-counter and prescription medicines only as told by your doctor.  Change bandages as told by your doctor.  Keep your bandage dry. If a bandage gets wet, have it changed right away.  Keep all follow-up visits as told by your doctor. This is important. Throwing away supplies  Throw away any syringes in a trash (disposal) container that is only for sharp items (sharps container). You can buy a sharps container from a pharmacy, or you  can make one by using an empty hard plastic bottle with a cover.  Place any used bandages or infusion bags into a plastic bag. Throw that bag in the trash. Contact a doctor if:  You have any of these where the tube was put in: ? Redness, swelling, or pain. ? Fluid or blood. ? A warm feeling. ? Pus or a bad smell. Get help right away if:  You have: ? A fever. ? Chills. ? Trouble getting enough air (shortness of breath). ? Trouble breathing. ? Pain in your chest. ? Swelling in your neck, face, chest, or arm.  You are coughing.  You feel your heart beating fast or skipping beats.  You feel dizzy or you pass out (faint).  There are red lines coming from where the tube was put in.  The area where the tube was put in is bleeding and the bleeding will not stop.  Your tube is hard to flush.  You do not get a blood return from the tube.  The tube gets loose or comes out.  The tube has a hole or a tear.  The tube leaks. Summary  A central line is a thin, flexible tube (catheter) that is put in your vein. It can be used to take blood for lab tests or to give you medicine.  Follow instructions from your doctor about flushing and cleaning the  tube.  Keep the area where the tube was put in clean and dry.  Ask your doctor what activities are safe for you. This information is not intended to replace advice given to you by your health care provider. Make sure you discuss any questions you have with your health care provider. Document Released: 02/09/2012 Document Revised: 06/14/2018 Document Reviewed: 03/11/2016 Elsevier Patient Education  2020 [employer].

## 2018-10-10 NOTE — Progress Notes (Signed)
[place] OFFICE PROGRESS NOTE   Diagnosis: Cholangiocarcinoma  INTERVAL HISTORY:   [name] completed a second cycle of gemcitabine/Abraxane on 09/27/2018.  No nausea, fever, rash, or neuropathy symptoms.  She has noted partial alopecia.  Right abdominal pain has resolved.  Objective:  Vital signs in last 24 hours:  Blood pressure (!) 147/60, pulse (!) 54, temperature 98.7 F (37.1 C), temperature source Oral, resp. rate 17, height 5' 2" (1.575 m), weight 134 lb 12.8 oz (61.1 kg), SpO2 98 %.    Limited physical examination secondary to distancing with the COVID pandemic GI: No hepatomegaly, nontender Vascular: No leg edema   Portacath/PICC-without erythema  Lab Results:  Lab Results  Component Value Date   WBC 6.1 10/10/2018   HGB 10.6 (L) 10/10/2018   HCT 32.9 (L) 10/10/2018   MCV 97.3 10/10/2018   PLT 186 10/10/2018   NEUTROABS 3.5 10/10/2018    CMP  Lab Results  Component Value Date   NA 140 09/27/2018   K 4.5 09/27/2018   CL 106 09/27/2018   CO2 25 09/27/2018   GLUCOSE 113 (H) 09/27/2018   BUN 20 09/27/2018   CREATININE 1.31 (H) 09/27/2018   CALCIUM 9.0 09/27/2018   PROT 6.8 09/27/2018   ALBUMIN 3.4 (L) 09/27/2018   AST 19 09/27/2018   ALT 17 09/27/2018   ALKPHOS 68 09/27/2018   BILITOT 0.4 09/27/2018   GFRNONAA 36 (L) 09/27/2018   GFRAA 42 (L) 09/27/2018    Lab Results  Component Value Date   CEA1 1.96 03/21/2018    Lab Results  Component Value Date   INR 1.1 09/06/2018    Imaging:  No results found.  Medications: I have reviewed the patient's current medications.   Assessment/Plan: 1. Anterior liver mass, ultrasound-guided biopsy 09/22/2017-adenocarcinoma, MSI-stable, tumor mutation burden 0,IDH1 R132G, BAP1 loss ? CT chest 09/07/2017, 4.1 cm hypodense anterior liver mass, changes of chronic hypersensitivity pneumonitis ? MRI abdomen 09/13/2017-isolated liver mass measuring 4.7 x 3.8 cm involving segments 4B and 5, no  additional liver masses ? SBRT to the liver mass beginning 10/31/2017, completed 11/10/2017 ? CT abdomen/pelvis 01/23/2018-radiation changes in the liver, liver mass measuring 5.9 x 4.4 x 5.0 cm but no evidence of metastatic disease in the abdomen or pelvis ? MRI abdomen 02/22/2018-dominant central left liver lesion slightly increased in size with decreased central enhancement suggesting necrosis, 2 new lesions including an 11 mm left hepatic lesion and a 9 mm right hepatic lesion ? Xeloda 7 days on/7 days off beginning 03/27/2018 (1500 mg every morning and 1000 mg every afternoon) ? Xeloda dose reduced to 1000 mg every morning and 1000 mg every afternoon beginning 04/24/2018 due to diarrhea ? Xeloda discontinued after an office visit 05/15/2018 secondary to hand/foot syndrome and diarrhea ? CT abdomen/pelvis 06/12/2018-decrease in size of dominant left hepatic mass, no significant change in smaller liver lesions, stable biliary ductal dilatation, diffuse "colitis " ? CT 08/31/2018- decreased size of the segment 5 lesion, increased size of other liver lesions and new lesions, no evidence of extrahepatic metastatic disease, persistent colonic thickening ? Cycle 1 gemcitabine/Abraxane 09/12/2018 ? Cycle 2 gemcitabine/Abraxane 09/27/2018 ? Cycle 3 gemcitabine/Abraxane 10/10/2018 2. Abdominal pain 3. History of diastolic heart failure 4. Hypertension 5. G3, P3 6. History of anemia 7. Right foot cellulitis 05/22/2018-Keflex prescribed 05/23/2018 8. C. difficile colitis 06/14/2018-treated with vancomycin, recurrent diarrhea 07/04/2018-second course of vancomycin (prolonged taper) prescribed, persistent diarrhea 08/06/2018-improved after diet change   Disposition: Summer Nielsen is tolerating chemotherapy well.  She will complete another cycle today.  We are encouraged the abdominal pain has improved. She will return for an office visit in the next cycle of chemotherapy in 2 weeks.  The plan is to schedule a restaging  CT after 5 cycles of gemcitabine/Abraxane.  Betsy Coder, MD  10/10/2018  11:01 AM

## 2018-10-10 NOTE — Patient Instructions (Signed)
[facility] Discharge Instructions for Patients Receiving Chemotherapy  Today you received the following chemotherapy agents: Abraxane and Gemzar.  To help prevent nausea and vomiting after your treatment, we encourage you to take your nausea medication as directed.    If you develop nausea and vomiting that is not controlled by your nausea medication, call the clinic.   BELOW ARE SYMPTOMS THAT SHOULD BE REPORTED IMMEDIATELY:  *FEVER GREATER THAN 100.5 F  *CHILLS WITH OR WITHOUT FEVER  NAUSEA AND VOMITING THAT IS NOT CONTROLLED WITH YOUR NAUSEA MEDICATION  *UNUSUAL SHORTNESS OF BREATH  *UNUSUAL BRUISING OR BLEEDING  TENDERNESS IN MOUTH AND THROAT WITH OR WITHOUT PRESENCE OF ULCERS  *URINARY PROBLEMS  *BOWEL PROBLEMS  UNUSUAL RASH Items with * indicate a potential emergency and should be followed up as soon as possible.  Feel free to call the clinic should you have any questions or concerns. The clinic phone number is [phone].  Please show the CHEMO ALERT CARD at check-in to the Emergency Department and triage nurse.   

## 2018-10-12 ENCOUNTER — Telehealth: Payer: Self-pay | Admitting: Oncology

## 2018-10-12 NOTE — Telephone Encounter (Signed)
Called and spoke with patient. Confirmed 9/1 appt

## 2018-10-14 NOTE — Progress Notes (Signed)
CARDIOLOGY OFFICE NOTE  Date:  10/16/2018    Summer Nielsen Date of Birth: Jul 14, 1927 Medical Record #[id number]  PCP:  Maurice Small, MD  Cardiologist:  [facility]   Chief Complaint  Patient presents with  . Follow-up    6 month check - seen for Dr. Marlou Porch    History of Present Illness: Summer Nielsen is a 85 y.o. female who presents today for a 6 month check. Seen for Dr. Marlou Porch.   She has a hx of acute diastolic heart failure with elevated BNP, grade 2 diastolic dysfunction on echocardiogram,and prior pulmonary infiltrate.  In July 2017, she began having more shortness of breath and was diagnosed originally with pneumonia. No fever, no chills. Treated with antibiotic course. She never improved that much. Dr. Justin Mend checked an echocardiogram which showed grade 2 diastolic dysfunction and BNP which was 200. She placed her on Lasix 40 mg once a day. Her lower extremity edema improved. Her breathing improved to a certain degree. She has had some bradycardia with Inderal noted. There has been concern for potential pulmonary fibrosis. She has had a liver mass noted last year in 2019 - found to have cancer - treated with XRT and chemotherapy.   Last seen in January by Cecilie Kicks, NP - felt to be doing ok from our standpoint.   The patient does not have symptoms concerning for COVID-19 infection (fever, chills, cough, or new shortness of breath).   Comes in today. Here alone. Feels like she is doing "fair". She is now on IV chemo - did not tolerate Xeloda due to significant GI upset/diarrhea. Has a port in place.  Remains pretty short of breath - with minimal activity. Feels this is worse. Has lost weight - no appetite. BP ok so far. Not dizzy. Minimal swelling in her feet. Eating take out. Still able to do most of her ADLs and also trying to take care of her 72 year old husband. She has been going out to the stores - she is strongly cautioned about this.   Past Medical History:   Diagnosis Date  . Cancer ([place])    squamous cell skin cancer on left leg-removed  . CHF (congestive heart failure) ([place])   . Cholangiocarcinoma ([place]) dx'd 11/2017  . Complication of anesthesia   . GERD (gastroesophageal reflux disease)   . Glaucoma   . High blood pressure   . Hyperlipidemia   . Hypothyroidism   . OP (osteoporosis)   . PONV (postoperative nausea and vomiting)   . Postsurgical hypothyroidism   . 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 Tachycardia   . TIA (transient ischemic attack)     Past Surgical History:  Procedure Laterality Date  . ABDOMINAL HYSTERECTOMY    . BACK SURGERY N/A 03/2017   Lumbar  . CHOLECYSTECTOMY    . EYE SURGERY     removed cateracts on both eyes  . IR IMAGING GUIDED PORT INSERTION  09/06/2018  . JOINT REPLACEMENT     3 hip replacement on left, right knee replacement  . THYROIDECTOMY       Medications: Current Meds  Medication Sig  . acetaminophen (TYLENOL) 500 MG tablet Take 1,000 mg by mouth daily as needed for moderate pain or headache.  Marland Kitchen amLODipine (NORVASC) 5 MG tablet Take 5 mg by mouth daily.  Marland Kitchen atorvastatin (LIPITOR) 20 MG tablet Take 20 mg by mouth daily.  . bimatoprost (LUMIGAN) 0.01 % SOLN Place 1 drop into both  eyes at bedtime.   . calcium-vitamin D (OSCAL WITH D) 500-200 MG-UNIT tablet Take 1 tablet by mouth daily.   . diphenoxylate-atropine (LOMOTIL) 2.5-0.025 MG tablet Take 2 tablets by mouth 4 (four) times daily as needed for diarrhea or loose stools.  . furosemide (LASIX) 40 MG tablet Take 40 mg by mouth 2 (two) times daily.  Marland Kitchen gabapentin (NEURONTIN) 300 MG capsule TAKE 1 CAPSULE BY MOUTH THREE TIMES A DAY  . HYDROmorphone (DILAUDID) 2 MG tablet Take 0.5-1 tablets (1-2 mg total) by mouth every 4 (four) hours as needed for severe pain.  Marland Kitchen ibuprofen (ADVIL,MOTRIN) 200 MG tablet Take 200 mg by mouth every 6 (six) hours as needed for moderate pain (back surgery).  . Iron-FA-B  Cmp-C-Biot-Probiotic (FUSION PLUS) CAPS Take 1 capsule by mouth daily.  Marland Kitchen KLOR-CON M10 10 MEQ tablet TAKE 2 TABLETS EVERY DAY  . levothyroxine (SYNTHROID, LEVOTHROID) 150 MCG tablet Take 150 mcg by mouth daily.  Marland Kitchen lidocaine-prilocaine (EMLA) cream Apply 1 application topically as directed.  . loperamide (IMODIUM) 2 MG capsule Take 2 capsules by mouth 4 (four) times daily as needed.  Marland Kitchen losartan (COZAAR) 50 MG tablet Take 50 mg by mouth daily.  Marland Kitchen omeprazole (PRILOSEC) 40 MG capsule TAKE 1 CAPSULE 30 MINUTES BEFORE MORNING MEAL  . prochlorperazine (COMPAZINE) 5 MG tablet Take 1-2 tablets (5-10 mg total) by mouth every 6 (six) hours as needed for nausea or vomiting.  . propranolol (INDERAL) 20 MG tablet Take 20 mg by mouth 2 (two) times daily.      Allergies: Allergies  Allergen Reactions  . Levofloxacin Nausea Only    Dizzy, hot, foot pain, insomnia  . Percocet [Oxycodone-Acetaminophen] Nausea And Vomiting and Other (See Comments)    sick    Social History: The patient  reports that she has never smoked. She has never used smokeless tobacco. She reports that she does not drink alcohol or use drugs.   Family History: The patient's family history includes Cerebral aneurysm in her mother; Heart attack in her father and mother; Heart disease in her brother and sister; Hypertension in her mother; Stroke in her mother.   Review of Systems: Please see the history of present illness.   All other systems are reviewed and negative.   Physical Exam: VS:  BP 138/62 (BP Location: Left Arm, Patient Position: Sitting, Cuff Size: Normal)   Pulse (!) 52   Ht _0  (1.575 m)   Wt 135 lb 1.9 oz (61.3 kg)   LMP  (LMP Unknown)   BMI 24.71 kg/m  .  BMI Body mass index is 24.71 kg/m.  Wt Readings from Last 3 Encounters:  10/16/18 135 lb 1.9 oz (61.3 kg)  10/10/18 134 lb 12.8 oz (61.1 kg)  09/27/18 135 lb 11.2 oz (61.6 kg)    General: Pleasant. Elderly. Alert and in no acute distress.  Weight is  down from 146 at her last visit here in January. Color looks a little sallow.  HEENT: Normal.  Neck: Supple, no JVD, carotid bruits, or masses noted.  Cardiac: Regular rate and rhythm. No murmurs, rubs, or gallops. No edema.  Respiratory:  Lungs are rather coarse with crisp crackling noted. Oxygen sat was 98% on RA.  GI: Soft and nontender.  MS: No deformity or atrophy. Gait and ROM intact. She is using a cane.   Skin: Warm and dry. Color is normal.  Neuro:  Strength and sensation are intact and no gross focal deficits noted.  Psych: Alert, appropriate and  with normal affect.   LABORATORY DATA:  EKG:  EKG is ordered today. This demonstrates sinus bradycardia with a HR of 52.  Lab Results  Component Value Date   WBC 6.1 10/10/2018   HGB 10.6 (L) 10/10/2018   HCT 32.9 (L) 10/10/2018   PLT 186 10/10/2018   GLUCOSE 111 (H) 10/10/2018   CHOL 164 03/23/2018   TRIG 169 (H) 03/23/2018   HDL 45 03/23/2018   LDLCALC 85 03/23/2018   ALT 23 10/10/2018   AST 21 10/10/2018   NA 141 10/10/2018   K 4.2 10/10/2018   CL 107 10/10/2018   CREATININE 1.31 (H) 10/10/2018   BUN 22 10/10/2018   CO2 26 10/10/2018   TSH 9.900 (H) 08/08/2017   INR 1.1 09/06/2018       BNP (last 3 results) No results for input(s): BNP in the last 8760 hours.  ProBNP (last 3 results) No results for input(s): PROBNP in the last 8760 hours.   Other Studies Reviewed Today:  Myoview Study Highlights 06/2016    Nuclear stress EF: 71%.  No T wave inversion was noted during stress.  There was no ST segment deviation noted during stress.  This is a low risk study.   No reversible ischemia. LVEF 71% with normal wall motion. This is a low risk study.    ECHO Study Conclusions 05/2016  - Left ventricle: The cavity size was normal. Systolic function was   normal. The estimated ejection fraction was in the range of 55%   to 60%. Wall motion was normal; there were no regional wall   motion abnormalities.  There was a reduced contribution of atrial   contraction to ventricular filling, due to increased ventricular   diastolic pressure or atrial contractile dysfunction. Features   are consistent with a pseudonormal left ventricular filling   pattern, with concomitant abnormal relaxation and increased   filling pressure (grade 2 diastolic dysfunction). - Aortic valve: Trileaflet; mildly thickened, mildly calcified   leaflets. There was trivial regurgitation. - Mitral valve: Calcified annulus. Valve area by pressure   half-time: 1.45 cm^2. - Left atrium: The atrium was moderately dilated. - Pulmonary arteries: PA peak pressure: 35 mm Hg (S).    ASSESSMENT AND PLAN:  1.  Chronic diastolic HF - worsening DOE - most likely multifactorial with chemo/abnormal chest CT/anemia - will get her echo updated to make sure we do not have a change. Try to restrict salt.   2. Chronic DOE - looked to have chronic hypersensitivity pneumonitis on prior CT - she saw pulmonary - does not wish to go back - actually never had treatment due to that CT showing her liver cancer. She is going to speak with her PCP about options. We will make sure her echo looks ok.   3.  HTN - BP ok - I have asked her to monitor - as she loses weight she may need reduction in medicines.   4. HLD - remains on statin - has normal LFTs - may need to consider stopping  5.  Liver cancer - followed by Oncology - now on IV chemo  6. Chronic resting bradycardia - HR is 52. She is not symptomatic with this - will need to follow.   6. COVID-19 Education: The signs and symptoms of COVID-19 were discussed with the patient and how to seek care for testing (follow up with PCP or arrange E-visit).  The importance of social distancing, staying at home, hand hygiene and wearing a mask when  out in public were discussed today.  Current medicines are reviewed with the patient today.  The patient does not have concerns regarding medicines other  than what has been noted above.  The following changes have been made:  See above.  Labs/ tests ordered today include:    Orders Placed This Encounter  Procedures  . EKG 12-Lead  . ECHOCARDIOGRAM COMPLETE     Disposition:   FU with me in 3 months.   Patient is agreeable to this plan and will call if any problems develop in the interim.   SignedTruitt Merle, NP  10/16/2018 9:41 AM  [facility] [address] [place], [place]  [address] Phone: [phone] Fax: [phone]

## 2018-10-16 ENCOUNTER — Other Ambulatory Visit: Payer: Self-pay

## 2018-10-16 ENCOUNTER — Encounter: Payer: Self-pay | Admitting: Nurse Practitioner

## 2018-10-16 ENCOUNTER — Ambulatory Visit (INDEPENDENT_AMBULATORY_CARE_PROVIDER_SITE_OTHER): Payer: Medicare Other | Admitting: Nurse Practitioner

## 2018-10-16 VITALS — BP 138/62 | HR 52 | Ht 62.0 in | Wt 135.1 lb

## 2018-10-16 DIAGNOSIS — E785 Hyperlipidemia, unspecified: Secondary | ICD-10-CM

## 2018-10-16 DIAGNOSIS — I10 Essential (primary) hypertension: Secondary | ICD-10-CM

## 2018-10-16 DIAGNOSIS — Z79899 Other long term (current) drug therapy: Secondary | ICD-10-CM

## 2018-10-16 DIAGNOSIS — R0602 Shortness of breath: Secondary | ICD-10-CM | POA: Diagnosis not present

## 2018-10-16 DIAGNOSIS — I5032 Chronic diastolic (congestive) heart failure: Secondary | ICD-10-CM | POA: Diagnosis not present

## 2018-10-16 NOTE — Patient Instructions (Addendum)
After Visit Summary:  We will be checking the following labs today - NONE   Medication Instructions:    Continue with your current medicines.    If you need a refill on your cardiac medications before your next appointment, please call your pharmacy.     Testing/Procedures To Be Arranged:  Echocardiogram  Follow-Up:   See me in 3 months    At [facility], you and your health needs are our priority.  As part of our continuing mission to provide you with exceptional heart care, we have created designated Provider Care Teams.  These Care Teams include your primary Cardiologist (physician) and Advanced Practice Providers (APPs -  Physician Assistants and Nurse Practitioners) who all work together to provide you with the care you need, when you need it.  Special Instructions:  . Stay safe, stay home, wash your hands for at least 20 seconds and wear a mask when out in public.   It was good to talk with you today.   Try to use less salt  Keep a check on your BP and let us know if you have any dizzy spells or BP starts running low  Talk with your PCP about your breathing too and seeing pulmonary again of if she has other ideas for you.    Call the [place] office at [phone] if you have any questions, problems or concerns.

## 2018-10-19 ENCOUNTER — Telehealth: Payer: Self-pay | Admitting: *Deleted

## 2018-10-19 ENCOUNTER — Ambulatory Visit (HOSPITAL_COMMUNITY): Payer: Medicare Other | Attending: Cardiovascular Disease

## 2018-10-19 ENCOUNTER — Other Ambulatory Visit: Payer: Self-pay

## 2018-10-19 DIAGNOSIS — I5032 Chronic diastolic (congestive) heart failure: Secondary | ICD-10-CM | POA: Diagnosis present

## 2018-10-19 DIAGNOSIS — R0602 Shortness of breath: Secondary | ICD-10-CM | POA: Insufficient documentation

## 2018-10-19 NOTE — Telephone Encounter (Signed)
-----  Message from Nuala Alpha, LPN sent at 3/99/0852  4:45 PM EDT -----  ----- Message ----- From: Burtis Junes, NP Sent: 10/19/2018   4:29 PM EDT To: [facility] [address] Triage  Ok to report. Her echo looks good. She still has normal pumping function - this is good.  This would indicate that her shortness of breath is more related to her lungs/her anemia from chemo, etc.  Copy to her PCP. She was going to speak to her PCP about possible inhaler.  We will see back as planned.

## 2018-10-19 NOTE — Telephone Encounter (Signed)
Pt has been notified of her echo results by phone with verbal understanding. I will fax results to PCP Dr. Maurice Small. Pt aware to continue with current Tx plan at this time and we will see her in 01/2019 with Truitt Merle, NP. Pt thanked me for the call and the good news. The patient has been notified of the result and verbalized understanding.  All questions (if any) were answered. Julaine Hua, [facility] 10/19/2018 5:01 PM

## 2018-10-22 ENCOUNTER — Other Ambulatory Visit: Payer: Self-pay | Admitting: Oncology

## 2018-10-23 NOTE — Progress Notes (Addendum)
[place]   [phone] [phone]   Clinic Follow up Note   Patient Care Team: Maurice Small, MD as PCP - General (Family Medicine) 10/24/2018  CHIEF COMPLAINT: f/u cholangiocarcinoma   CURRENT THERAPY: Gemcitabine/Abraxane   INTERVAL HISTORY: Summer Nielsen returns for f/u and treatment as scheduled. She completed cycle 3 gemcitabine and abraxane 2 weeks ago. She feels well today. She has fatigue 1 day after treatment. She will have diarrhea sporadically, up to 4 episodes on a bad day. Imodium helps. Denies blood in stool. Denies abdominal pain today, pain in general is better since starting chemo. Denies n/v. Eating and drinking well. Denies mucositis. Denies rash. Denies fever or chills. She does feel cold often. Takes tylenol for headache occasionally. Denies neuropathy, cough, chest pain, dyspnea. Legs swell at the ankles every now and then, no calf pain.    MEDICAL HISTORY:  Past Medical History:  Diagnosis Date  . Cancer ([place])    squamous cell skin cancer on left leg-removed  . CHF (congestive heart failure) ([place])   . 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 Complication of anesthesia   . GERD (gastroesophageal reflux disease)   . Glaucoma   . High blood pressure   . Hyperlipidemia   . Hypothyroidism   . OP (osteoporosis)   . PONV (postoperative nausea and vomiting)   . Postsurgical hypothyroidism   . 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 Tachycardia   . TIA (transient ischemic attack)     SURGICAL HISTORY: Past Surgical History:  Procedure Laterality Date  . ABDOMINAL HYSTERECTOMY    . BACK SURGERY N/A 03/2017   Lumbar  . CHOLECYSTECTOMY    . EYE SURGERY     removed cateracts on both eyes  . IR IMAGING GUIDED PORT INSERTION  09/06/2018  . JOINT REPLACEMENT     3 hip replacement on left, right knee replacement  . THYROIDECTOMY      I have reviewed the social history and family history with the  patient and they are unchanged from previous note.  ALLERGIES:  is allergic to levofloxacin and percocet [oxycodone-acetaminophen].  MEDICATIONS:  Current Outpatient Medications  Medication Sig Dispense Refill  . acetaminophen (TYLENOL) 500 MG tablet Take 1,000 mg by mouth daily as needed for moderate pain or headache.    Marland Kitchen amLODipine (NORVASC) 5 MG tablet Take 5 mg by mouth daily.    Marland Kitchen atorvastatin (LIPITOR) 20 MG tablet Take 20 mg by mouth daily.  3  . bimatoprost (LUMIGAN) 0.01 % SOLN Place 1 drop into both eyes at bedtime.     . calcium-vitamin D (OSCAL WITH D) 500-200 MG-UNIT tablet Take 1 tablet by mouth daily.     . diphenoxylate-atropine (LOMOTIL) 2.5-0.025 MG tablet Take 2 tablets by mouth 4 (four) times daily as needed for diarrhea or loose stools. 60 tablet 0  . furosemide (LASIX) 40 MG tablet Take 40 mg by mouth 2 (two) times daily.    Marland Kitchen gabapentin (NEURONTIN) 300 MG capsule TAKE 1 CAPSULE BY MOUTH THREE TIMES A DAY 270 capsule 3  . HYDROmorphone (DILAUDID) 2 MG tablet Take 0.5-1 tablets (1-2 mg total) by mouth every 4 (four) hours as needed for severe pain. 30 tablet 0  . ibuprofen (ADVIL,MOTRIN) 200 MG tablet Take 200 mg by mouth every 6 (six) hours as needed for moderate pain (back surgery).    . Iron-FA-B Cmp-C-Biot-Probiotic (FUSION PLUS) CAPS Take 1 capsule by mouth daily.    Marland Kitchen KLOR-CON  M10 10 MEQ tablet TAKE 2 TABLETS EVERY DAY 180 tablet 0  . levothyroxine (SYNTHROID, LEVOTHROID) 150 MCG tablet Take 150 mcg by mouth daily.    Marland Kitchen lidocaine-prilocaine (EMLA) cream Apply 1 application topically as directed. 30 g 0  . loperamide (IMODIUM) 2 MG capsule Take 2 capsules by mouth 4 (four) times daily as needed.    Marland Kitchen losartan (COZAAR) 50 MG tablet Take 50 mg by mouth daily.    Marland Kitchen omeprazole (PRILOSEC) 40 MG capsule TAKE 1 CAPSULE 30 MINUTES BEFORE MORNING MEAL    . prochlorperazine (COMPAZINE) 5 MG tablet Take 1-2 tablets (5-10 mg total) by mouth every 6 (six) hours as needed for  nausea or vomiting. 60 tablet 1  . propranolol (INDERAL) 20 MG tablet Take 20 mg by mouth 2 (two) times daily.      No current facility-administered medications for this visit.    Facility-Administered Medications Ordered in Other Visits  Medication Dose Route Frequency Provider Last Rate Last Dose  . gemcitabine (GEMZAR) 1,330 mg in sodium chloride 0.9 % 250 mL chemo infusion  800 mg/m2 (Treatment Plan Recorded) Intravenous Once Ladell Pier, MD      . heparin lock flush 100 unit/mL  500 Units Intracatheter Once PRN Ladell Pier, MD      . PACLitaxel-protein bound (ABRAXANE) chemo infusion 175 mg  100 mg/m2 (Treatment Plan Recorded) Intravenous Once Ladell Pier, MD      . prochlorperazine (COMPAZINE) tablet 5 mg  5 mg Oral Once Ladell Pier, MD      . sodium chloride flush (NS) 0.9 % injection 10 mL  10 mL Intracatheter PRN Ladell Pier, MD        PHYSICAL EXAMINATION: ECOG PERFORMANCE STATUS: 1 - Symptomatic but completely ambulatory  Vitals:   10/24/18 1149  BP: (!) 149/58  Pulse: 69  Resp: 16  Temp: 98.3 F (36.8 C)  SpO2: 97%   Filed Weights   10/24/18 1149  Weight: 136 lb 4.8 oz (61.8 kg)    GENERAL:alert, no distress and comfortable SKIN: no rash  EYES: sclera clear OROPHARYNX: no thrush or ulcers LUNGS: clear to auscultation with normal breathing effort HEART: regular rate & rhythm, trace pitting lower extremity edema ABDOMEN:abdomen soft, non-tender and normal bowel sounds Musculoskeletal:no cyanosis of digits  NEURO: alert & oriented x 3 with fluent speech, ambulates with cane PAC without erythema Limited exam for covid19 outbreak   LABORATORY DATA:  I have reviewed the data as listed CBC Latest Ref Rng & Units 10/24/2018 10/10/2018 09/27/2018  WBC 4.0 - 10.5 K/uL 7.4 6.1 6.6  Hemoglobin 12.0 - 15.0 g/dL 9.9(L) 10.6(L) 10.2(L)  Hematocrit 36.0 - 46.0 % 30.3(L) 32.9(L) 32.3(L)  Platelets 150 - 400 K/uL 227 186 275     CMP Latest Ref Rng &  Units 10/24/2018 10/10/2018 09/27/2018  Glucose 70 - 99 mg/dL 109(H) 111(H) 113(H)  BUN 8 - 23 mg/dL _0 Creatinine 0.44 - 1.00 mg/dL 1.24(H) 1.31(H) 1.31(H)  Sodium 135 - 145 mmol/L 141 141 140  Potassium 3.5 - 5.1 mmol/L 4.2 4.2 4.5  Chloride 98 - 111 mmol/L 106 107 106  CO2 22 - 32 mmol/L _1 Calcium 8.9 - 10.3 mg/dL 9.1 9.2 9.0  Total Protein 6.5 - 8.1 g/dL 6.8 6.9 6.8  Total Bilirubin 0.3 - 1.2 mg/dL 0.4 0.4 0.4  Alkaline Phos 38 - 126 U/L 76 70 68  AST 15 - 41 U/L _2 ALT  0 - 44 U/L _0 RADIOGRAPHIC STUDIES: I have personally reviewed the radiological images as listed and agreed with the findings in the report. No results found.   ASSESSMENT & PLAN:  1. Anterior liver mass, ultrasound-guided biopsy 09/22/2017-adenocarcinoma, MSI-stable, tumor mutation burden 0,IDH1 R132G, BAP1 loss ? CT chest 09/07/2017, 4.1 cm hypodense anterior liver mass, changes of chronic hypersensitivity pneumonitis ? MRI abdomen 09/13/2017-isolated liver mass measuring 4.7 x 3.8 cm involving segments 4B and 5, no additional liver masses ? SBRT to the liver mass beginning 10/31/2017, completed 11/10/2017 ? CT abdomen/pelvis 01/23/2018-radiation changes in the liver, liver mass measuring 5.9 x 4.4 x 5.0 cm but no evidence of metastatic disease in the abdomen or pelvis ? MRI abdomen 02/22/2018-dominant central left liver lesion slightly increased in size with decreased central enhancement suggesting necrosis, 2 new lesions including an 11 mm left hepatic lesion and a 9 mm right hepatic lesion ? Xeloda 7 days on/7 days off beginning 03/27/2018 (1500 mg every morning and 1000 mg every afternoon) ? Xeloda dose reduced to 1000 mg every morning and 1000 mg every afternoon beginning 04/24/2018 due to diarrhea ? Xeloda discontinued after an office visit 05/15/2018 secondary to hand/foot syndrome and diarrhea ? CT abdomen/pelvis 06/12/2018-decrease in size of dominant left hepatic mass, no significant  change in smaller liver lesions, stable biliary ductal dilatation, diffuse "colitis " ? CT 08/31/2018-decreased size of the segment 5 lesion, increased size of other liver lesions and new lesions, no evidence of extrahepatic metastatic disease, persistent colonic thickening ? Cycle 1 gemcitabine/Abraxane 09/12/2018 ? Cycle 2 gemcitabine/Abraxane 09/27/2018 ? Cycle 3 gemcitabine/Abraxane 10/10/2018 ? Cycle 4 gemcitabine/Abraxane 10/24/2018 2. Abdominal pain 3. History of diastolic heart failure 4. Hypertension 5. G3, P3 6. History of anemia 7. Right foot cellulitis 05/22/2018-Keflex prescribed 05/23/2018 8. C. difficile colitis 06/14/2018-treated with vancomycin, recurrent diarrhea 07/04/2018-second course of vancomycin (prolonged taper) prescribed, persistent diarrhea 08/06/2018-improved after diet change  Disposition:  Ms. Boisselle appears stable. She completed 3 cycles gemcitabine and abraxane. She tolerates treatment well with mild fatigue and intermittent diarrhea. Abdominal pain is improved. Labs reviewed, adequate for treatment. She will proceed with cycle 4 today, no dosage adjustments. She will return for f/u and cycle 5 in two weeks. The plan was reviewed with Dr. Benay Spice.    Orders Placed This Encounter  Procedures  . CBC with Differential (Cancer Center Only)    Standing Status:   Future    Standing Expiration Date:   10/24/2019  . CMP ([place] only)    Standing Status:   Future    Standing Expiration Date:   10/24/2019   All questions were answered. The patient knows to call the clinic with any problems, questions or concerns. No barriers to learning was detected.     Alla Feeling, NP 10/24/18

## 2018-10-24 ENCOUNTER — Inpatient Hospital Stay (HOSPITAL_BASED_OUTPATIENT_CLINIC_OR_DEPARTMENT_OTHER): Payer: Medicare Other | Admitting: Nurse Practitioner

## 2018-10-24 ENCOUNTER — Encounter: Payer: Self-pay | Admitting: Nurse Practitioner

## 2018-10-24 ENCOUNTER — Other Ambulatory Visit: Payer: Self-pay

## 2018-10-24 ENCOUNTER — Inpatient Hospital Stay: Payer: Medicare Other

## 2018-10-24 VITALS — BP 149/58 | HR 69 | Temp 98.3°F | Resp 16 | Ht 62.0 in | Wt 136.3 lb

## 2018-10-24 DIAGNOSIS — C221 Intrahepatic bile duct carcinoma: Secondary | ICD-10-CM | POA: Diagnosis not present

## 2018-10-24 DIAGNOSIS — Z95828 Presence of other vascular implants and grafts: Secondary | ICD-10-CM

## 2018-10-24 DIAGNOSIS — Z5111 Encounter for antineoplastic chemotherapy: Secondary | ICD-10-CM | POA: Diagnosis not present

## 2018-10-24 LAB — CBC WITH DIFFERENTIAL (CANCER CENTER ONLY)
Abs Immature Granulocytes: 0.02 10*3/uL (ref 0.00–0.07)
Basophils Absolute: 0 10*3/uL (ref 0.0–0.1)
Basophils Relative: 1 %
Eosinophils Absolute: 0.3 10*3/uL (ref 0.0–0.5)
Eosinophils Relative: 3 %
HCT: 30.3 % — ABNORMAL LOW (ref 36.0–46.0)
Hemoglobin: 9.9 g/dL — ABNORMAL LOW (ref 12.0–15.0)
Immature Granulocytes: 0 %
Lymphocytes Relative: 18 %
Lymphs Abs: 1.4 10*3/uL (ref 0.7–4.0)
MCH: 31.9 pg (ref 26.0–34.0)
MCHC: 32.7 g/dL (ref 30.0–36.0)
MCV: 97.7 fL (ref 80.0–100.0)
Monocytes Absolute: 1.2 10*3/uL — ABNORMAL HIGH (ref 0.1–1.0)
Monocytes Relative: 16 %
Neutro Abs: 4.6 10*3/uL (ref 1.7–7.7)
Neutrophils Relative %: 62 %
Platelet Count: 227 10*3/uL (ref 150–400)
RBC: 3.1 MIL/uL — ABNORMAL LOW (ref 3.87–5.11)
RDW: 17.2 % — ABNORMAL HIGH (ref 11.5–15.5)
WBC Count: 7.4 10*3/uL (ref 4.0–10.5)
nRBC: 0 % (ref 0.0–0.2)

## 2018-10-24 LAB — CMP (CANCER CENTER ONLY)
ALT: 20 U/L (ref 0–44)
AST: 19 U/L (ref 15–41)
Albumin: 3.5 g/dL (ref 3.5–5.0)
Alkaline Phosphatase: 76 U/L (ref 38–126)
Anion gap: 11 (ref 5–15)
BUN: 23 mg/dL (ref 8–23)
CO2: 24 mmol/L (ref 22–32)
Calcium: 9.1 mg/dL (ref 8.9–10.3)
Chloride: 106 mmol/L (ref 98–111)
Creatinine: 1.24 mg/dL — ABNORMAL HIGH (ref 0.44–1.00)
GFR, Est AFR Am: 45 mL/min — ABNORMAL LOW (ref >60)
GFR, Estimated: 38 mL/min — ABNORMAL LOW (ref >60)
Glucose, Bld: 109 mg/dL — ABNORMAL HIGH (ref 70–99)
Potassium: 4.2 mmol/L (ref 3.5–5.1)
Sodium: 141 mmol/L (ref 135–145)
Total Bilirubin: 0.4 mg/dL (ref 0.3–1.2)
Total Protein: 6.8 g/dL (ref 6.5–8.1)

## 2018-10-24 MED ORDER — PROCHLORPERAZINE MALEATE 10 MG PO TABS
5.0000 mg | ORAL_TABLET | Freq: Once | ORAL | Status: AC
  Administered 2018-10-24: 5 mg via ORAL

## 2018-10-24 MED ORDER — SODIUM CHLORIDE 0.9 % IV SOLN
800.0000 mg/m2 | Freq: Once | INTRAVENOUS | Status: AC
  Administered 2018-10-24: 16:00:00 1330 mg via INTRAVENOUS
  Filled 2018-10-24: qty 34.98

## 2018-10-24 MED ORDER — PACLITAXEL PROTEIN-BOUND CHEMO INJECTION 100 MG
100.0000 mg/m2 | Freq: Once | INTRAVENOUS | Status: AC
  Administered 2018-10-24: 15:00:00 175 mg via INTRAVENOUS
  Filled 2018-10-24: qty 35

## 2018-10-24 MED ORDER — SODIUM CHLORIDE 0.9% FLUSH
10.0000 mL | INTRAVENOUS | Status: DC | PRN
  Administered 2018-10-24: 17:00:00 10 mL
  Filled 2018-10-24: qty 10

## 2018-10-24 MED ORDER — HEPARIN SOD (PORK) LOCK FLUSH 100 UNIT/ML IV SOLN
500.0000 [IU] | Freq: Once | INTRAVENOUS | Status: AC | PRN
  Administered 2018-10-24: 17:00:00 500 [IU]
  Filled 2018-10-24: qty 5

## 2018-10-24 MED ORDER — PROCHLORPERAZINE MALEATE 10 MG PO TABS
ORAL_TABLET | ORAL | Status: AC
  Filled 2018-10-24: qty 1

## 2018-10-24 MED ORDER — SODIUM CHLORIDE 0.9% FLUSH
10.0000 mL | INTRAVENOUS | Status: DC | PRN
  Administered 2018-10-24: 11:00:00 10 mL via INTRAVENOUS
  Filled 2018-10-24: qty 10

## 2018-10-24 MED ORDER — SODIUM CHLORIDE 0.9 % IV SOLN
Freq: Once | INTRAVENOUS | Status: AC
  Administered 2018-10-24: 13:00:00 via INTRAVENOUS
  Filled 2018-10-24: qty 250

## 2018-10-24 NOTE — Addendum Note (Signed)
Addended by: Alla Feeling on: 10/24/2018 01:22 PM   Modules accepted: Orders

## 2018-10-24 NOTE — Patient Instructions (Signed)
[facility] Discharge Instructions for Patients Receiving Chemotherapy  Today you received the following chemotherapy agents: Abraxane and Gemzar.  To help prevent nausea and vomiting after your treatment, we encourage you to take your nausea medication as directed.    If you develop nausea and vomiting that is not controlled by your nausea medication, call the clinic.   BELOW ARE SYMPTOMS THAT SHOULD BE REPORTED IMMEDIATELY:  *FEVER GREATER THAN 100.5 F  *CHILLS WITH OR WITHOUT FEVER  NAUSEA AND VOMITING THAT IS NOT CONTROLLED WITH YOUR NAUSEA MEDICATION  *UNUSUAL SHORTNESS OF BREATH  *UNUSUAL BRUISING OR BLEEDING  TENDERNESS IN MOUTH AND THROAT WITH OR WITHOUT PRESENCE OF ULCERS  *URINARY PROBLEMS  *BOWEL PROBLEMS  UNUSUAL RASH Items with * indicate a potential emergency and should be followed up as soon as possible.  Feel free to call the clinic should you have any questions or concerns. The clinic phone number is [phone].  Please show the [place] at check-in to the Emergency Department and triage nurse.

## 2018-10-25 ENCOUNTER — Telehealth: Payer: Self-pay | Admitting: Nurse Practitioner

## 2018-10-25 NOTE — Telephone Encounter (Signed)
Scheduled appt per 8/18 los.

## 2018-11-04 ENCOUNTER — Other Ambulatory Visit: Payer: Self-pay | Admitting: Oncology

## 2018-11-07 ENCOUNTER — Inpatient Hospital Stay: Payer: Medicare Other

## 2018-11-07 ENCOUNTER — Inpatient Hospital Stay: Payer: Medicare Other | Attending: Oncology | Admitting: Oncology

## 2018-11-07 ENCOUNTER — Other Ambulatory Visit: Payer: Self-pay

## 2018-11-07 VITALS — BP 138/57 | HR 64 | Temp 98.7°F | Resp 17 | Ht 62.0 in | Wt 135.4 lb

## 2018-11-07 DIAGNOSIS — C221 Intrahepatic bile duct carcinoma: Secondary | ICD-10-CM

## 2018-11-07 DIAGNOSIS — I110 Hypertensive heart disease with heart failure: Secondary | ICD-10-CM | POA: Diagnosis not present

## 2018-11-07 DIAGNOSIS — I5032 Chronic diastolic (congestive) heart failure: Secondary | ICD-10-CM | POA: Diagnosis not present

## 2018-11-07 DIAGNOSIS — Z5111 Encounter for antineoplastic chemotherapy: Secondary | ICD-10-CM | POA: Diagnosis not present

## 2018-11-07 DIAGNOSIS — Z23 Encounter for immunization: Secondary | ICD-10-CM | POA: Diagnosis not present

## 2018-11-07 DIAGNOSIS — Z95828 Presence of other vascular implants and grafts: Secondary | ICD-10-CM

## 2018-11-07 DIAGNOSIS — Z9221 Personal history of antineoplastic chemotherapy: Secondary | ICD-10-CM | POA: Insufficient documentation

## 2018-11-07 LAB — CBC WITH DIFFERENTIAL (CANCER CENTER ONLY)
Abs Immature Granulocytes: 0.04 10*3/uL (ref 0.00–0.07)
Basophils Absolute: 0 10*3/uL (ref 0.0–0.1)
Basophils Relative: 0 %
Eosinophils Absolute: 0.4 10*3/uL (ref 0.0–0.5)
Eosinophils Relative: 4 %
HCT: 31.3 % — ABNORMAL LOW (ref 36.0–46.0)
Hemoglobin: 10.2 g/dL — ABNORMAL LOW (ref 12.0–15.0)
Immature Granulocytes: 1 %
Lymphocytes Relative: 17 %
Lymphs Abs: 1.4 10*3/uL (ref 0.7–4.0)
MCH: 32.4 pg (ref 26.0–34.0)
MCHC: 32.6 g/dL (ref 30.0–36.0)
MCV: 99.4 fL (ref 80.0–100.0)
Monocytes Absolute: 1.1 10*3/uL — ABNORMAL HIGH (ref 0.1–1.0)
Monocytes Relative: 13 %
Neutro Abs: 5.6 10*3/uL (ref 1.7–7.7)
Neutrophils Relative %: 65 %
Platelet Count: 233 10*3/uL (ref 150–400)
RBC: 3.15 MIL/uL — ABNORMAL LOW (ref 3.87–5.11)
RDW: 17.7 % — ABNORMAL HIGH (ref 11.5–15.5)
WBC Count: 8.5 10*3/uL (ref 4.0–10.5)
nRBC: 0 % (ref 0.0–0.2)

## 2018-11-07 LAB — CMP (CANCER CENTER ONLY)
ALT: 20 U/L (ref 0–44)
AST: 22 U/L (ref 15–41)
Albumin: 3.7 g/dL (ref 3.5–5.0)
Alkaline Phosphatase: 75 U/L (ref 38–126)
Anion gap: 10 (ref 5–15)
BUN: 23 mg/dL (ref 8–23)
CO2: 23 mmol/L (ref 22–32)
Calcium: 8.9 mg/dL (ref 8.9–10.3)
Chloride: 106 mmol/L (ref 98–111)
Creatinine: 1.24 mg/dL — ABNORMAL HIGH (ref 0.44–1.00)
GFR, Est AFR Am: 45 mL/min — ABNORMAL LOW (ref >60)
GFR, Estimated: 38 mL/min — ABNORMAL LOW (ref >60)
Glucose, Bld: 109 mg/dL — ABNORMAL HIGH (ref 70–99)
Potassium: 3.9 mmol/L (ref 3.5–5.1)
Sodium: 139 mmol/L (ref 135–145)
Total Bilirubin: 0.4 mg/dL (ref 0.3–1.2)
Total Protein: 7 g/dL (ref 6.5–8.1)

## 2018-11-07 MED ORDER — PACLITAXEL PROTEIN-BOUND CHEMO INJECTION 100 MG
100.0000 mg/m2 | Freq: Once | INTRAVENOUS | Status: AC
  Administered 2018-11-07: 12:00:00 175 mg via INTRAVENOUS
  Filled 2018-11-07: qty 35

## 2018-11-07 MED ORDER — SODIUM CHLORIDE 0.9% FLUSH
10.0000 mL | INTRAVENOUS | Status: DC | PRN
  Administered 2018-11-07: 14:00:00 10 mL
  Filled 2018-11-07: qty 10

## 2018-11-07 MED ORDER — SODIUM CHLORIDE 0.9 % IV SOLN
Freq: Once | INTRAVENOUS | Status: AC
  Administered 2018-11-07: 11:00:00 via INTRAVENOUS
  Filled 2018-11-07: qty 250

## 2018-11-07 MED ORDER — SODIUM CHLORIDE 0.9 % IV SOLN
800.0000 mg/m2 | Freq: Once | INTRAVENOUS | Status: AC
  Administered 2018-11-07: 13:00:00 1330 mg via INTRAVENOUS
  Filled 2018-11-07: qty 34.98

## 2018-11-07 MED ORDER — PROCHLORPERAZINE MALEATE 10 MG PO TABS
ORAL_TABLET | ORAL | Status: AC
  Filled 2018-11-07: qty 1

## 2018-11-07 MED ORDER — PROCHLORPERAZINE MALEATE 10 MG PO TABS
5.0000 mg | ORAL_TABLET | Freq: Once | ORAL | Status: AC
  Administered 2018-11-07: 11:00:00 5 mg via ORAL

## 2018-11-07 MED ORDER — HEPARIN SOD (PORK) LOCK FLUSH 100 UNIT/ML IV SOLN
500.0000 [IU] | Freq: Once | INTRAVENOUS | Status: AC | PRN
  Administered 2018-11-07: 14:00:00 500 [IU]
  Filled 2018-11-07: qty 5

## 2018-11-07 MED ORDER — SODIUM CHLORIDE 0.9% FLUSH
10.0000 mL | INTRAVENOUS | Status: DC | PRN
  Administered 2018-11-07: 10 mL via INTRAVENOUS
  Filled 2018-11-07: qty 10

## 2018-11-07 NOTE — Patient Instructions (Signed)
[facility] Discharge Instructions for Patients Receiving Chemotherapy  Today you received the following chemotherapy agents: Paclitaxel protein-bound (Abraxane) and Gemcitabine (Gemzar)  To help prevent nausea and vomiting after your treatment, we encourage you to take your nausea medication as directed.   If you develop nausea and vomiting that is not controlled by your nausea medication, call the clinic.   BELOW ARE SYMPTOMS THAT SHOULD BE REPORTED IMMEDIATELY:  *FEVER GREATER THAN 100.5 F  *CHILLS WITH OR WITHOUT FEVER  NAUSEA AND VOMITING THAT IS NOT CONTROLLED WITH YOUR NAUSEA MEDICATION  *UNUSUAL SHORTNESS OF BREATH  *UNUSUAL BRUISING OR BLEEDING  TENDERNESS IN MOUTH AND THROAT WITH OR WITHOUT PRESENCE OF ULCERS  *URINARY PROBLEMS  *BOWEL PROBLEMS  UNUSUAL RASH Items with * indicate a potential emergency and should be followed up as soon as possible.  Feel free to call the clinic should you have any questions or concerns. The clinic phone number is [phone].  Please show the CHEMO ALERT CARD at check-in to the Emergency Department and triage nurse.  Coronavirus (COVID-19) Are you at risk?  Are you at risk for the Coronavirus (COVID-19)?  To be considered HIGH RISK for Coronavirus (COVID-19), you have to meet the following criteria:  . Traveled to [place], [place], [place], [place] or [place]; or in the [place] to [place], [place], [place], or [place]; and have fever, cough, and shortness of breath within the last 2 weeks of travel OR . Been in close contact with a person diagnosed with COVID-19 within the last 2 weeks and have fever, cough, and shortness of breath . IF YOU DO NOT MEET THESE CRITERIA, YOU ARE CONSIDERED LOW RISK FOR COVID-19.  What to do if you are HIGH RISK for COVID-19?  . If you are having a medical emergency, call 911. . Seek medical care right away. Before you go to a doctor's office, urgent care or emergency  department, call ahead and tell them about your recent travel, contact with someone diagnosed with COVID-19, and your symptoms. You should receive instructions from your physician's office regarding next steps of care.  . When you arrive at healthcare provider, tell the healthcare staff immediately you have returned from visiting [place], [place], [place], [place] or [place]; or traveled in the [place] to [place], [place], [place], or [place]; in the last two weeks or you have been in close contact with a person diagnosed with COVID-19 in the last 2 weeks.   . Tell the health care staff about your symptoms: fever, cough and shortness of breath. . After you have been seen by a medical provider, you will be either: o Tested for (COVID-19) and discharged home on quarantine except to seek medical care if symptoms worsen, and asked to  - Stay home and avoid contact with others until you get your results (4-5 days)  - Avoid travel on public transportation if possible (such as bus, train, or airplane) or o Sent to the Emergency Department by EMS for evaluation, COVID-19 testing, and possible admission depending on your condition and test results.  What to do if you are LOW RISK for COVID-19?  Reduce your risk of any infection by using the same precautions used for avoiding the common cold or flu:  . Wash your hands often with soap and warm water for at least 20 seconds.  If soap and water are not readily available, use an alcohol-based hand sanitizer with at least 60% alcohol.  .   If coughing or sneezing, cover your mouth and nose by coughing or sneezing into the elbow areas of your shirt or coat, into a tissue or into your sleeve (not your hands). . Avoid shaking hands with others and consider head nods or verbal greetings only. . Avoid touching your eyes, nose, or mouth with unwashed hands.  . Avoid close contact with people who are sick. . Avoid places or events with large numbers of people  in one location, like concerts or sporting events. . Carefully consider travel plans you have or are making. . If you are planning any travel outside or inside the [place], visit the CDC's Travelers' Health webpage for the latest health notices. . If you have some symptoms but not all symptoms, continue to monitor at home and seek medical attention if your symptoms worsen. . If you are having a medical emergency, call 911.   ADDITIONAL HEALTHCARE OPTIONS FOR PATIENTS  [facility] / e-Visit: [email or web address]         [facility]: [phone]  [facility]: [phone]                   [facility]: [phone]   

## 2018-11-07 NOTE — Progress Notes (Signed)
[place] OFFICE PROGRESS NOTE   Diagnosis: Cholangiocarcinoma  INTERVAL HISTORY:   Summer Nielsen completed under treatment with gemcitabine/Abraxane on 10/24/2018.  No nausea, neuropathy symptoms, fever, or rash.  She feels cold at times.  Her appetite is approximately 75% of normal.  No abdominal pain.  Objective:  Vital signs in last 24 hours:  Blood pressure (!) 138/57, pulse 64, temperature 98.7 F (37.1 C), temperature source Oral, resp. rate 17, height _0  (1.575 m), weight 135 lb 6.4 oz (61.4 kg), SpO2 96 %.   Resp: Inspiratory rales at the bilateral posterior chest, worse at the lower chest, no respiratory distress Cardio: Regular rate and rhythm GI: No hepatomegaly, no mass, nontender Vascular: Trace edema at the lower leg bilaterally   Portacath/PICC-without erythema  Lab Results:  Lab Results  Component Value Date   WBC 8.5 11/07/2018   HGB 10.2 (L) 11/07/2018   HCT 31.3 (L) 11/07/2018   MCV 99.4 11/07/2018   PLT 233 11/07/2018   NEUTROABS 5.6 11/07/2018    CMP  Lab Results  Component Value Date   NA 139 11/07/2018   K 3.9 11/07/2018   CL 106 11/07/2018   CO2 23 11/07/2018   GLUCOSE 109 (H) 11/07/2018   BUN 23 11/07/2018   CREATININE 1.24 (H) 11/07/2018   CALCIUM 8.9 11/07/2018   PROT 7.0 11/07/2018   ALBUMIN 3.7 11/07/2018   AST 22 11/07/2018   ALT 20 11/07/2018   ALKPHOS 75 11/07/2018   BILITOT 0.4 11/07/2018   GFRNONAA 38 (L) 11/07/2018   GFRAA 45 (L) 11/07/2018    Lab Results  Component Value Date   CEA1 1.96 03/21/2018     Medications: I have reviewed the patient's current medications.   Assessment/Plan: 1. Anterior liver mass, ultrasound-guided biopsy 09/22/2017-adenocarcinoma, MSI-stable, tumor mutation burden 0,IDH1 R132G, BAP1 loss ? CT chest 09/07/2017, 4.1 cm hypodense anterior liver mass, changes of chronic hypersensitivity pneumonitis ? MRI abdomen 09/13/2017-isolated liver mass measuring 4.7 x 3.8 cm involving  segments 4B and 5, no additional liver masses ? SBRT to the liver mass beginning 10/31/2017, completed 11/10/2017 ? CT abdomen/pelvis 01/23/2018-radiation changes in the liver, liver mass measuring 5.9 x 4.4 x 5.0 cm but no evidence of metastatic disease in the abdomen or pelvis ? MRI abdomen 02/22/2018-dominant central left liver lesion slightly increased in size with decreased central enhancement suggesting necrosis, 2 new lesions including an 11 mm left hepatic lesion and a 9 mm right hepatic lesion ? Xeloda 7 days on/7 days off beginning 03/27/2018 (1500 mg every morning and 1000 mg every afternoon) ? Xeloda dose reduced to 1000 mg every morning and 1000 mg every afternoon beginning 04/24/2018 due to diarrhea ? Xeloda discontinued after an office visit 05/15/2018 secondary to hand/foot syndrome and diarrhea ? CT abdomen/pelvis 06/12/2018-decrease in size of dominant left hepatic mass, no significant change in smaller liver lesions, stable biliary ductal dilatation, diffuse "colitis " ? CT 08/31/2018- decreased size of the segment 5 lesion, increased size of other liver lesions and new lesions, no evidence of extrahepatic metastatic disease, persistent colonic thickening ? Cycle 1 gemcitabine/Abraxane 09/12/2018 ? Cycle 2 gemcitabine/Abraxane 09/27/2018 ? Cycle 3 gemcitabine/Abraxane 10/10/2018 ? Cycle 4 gemcitabine/Abraxane 10/24/2018 ? Cycle 5 gemcitabine/Abraxane 11/07/2018 2. Abdominal pain 3. History of diastolic heart failure 4. Hypertension 5. G3, P3 6. History of anemia 7. Right foot cellulitis 05/22/2018-Keflex prescribed 05/23/2018 8. C. difficile colitis 06/14/2018-treated with vancomycin, recurrent diarrhea 07/04/2018-second course of vancomycin (prolonged taper) prescribed, persistent diarrhea 08/06/2018-improved after diet change  Disposition: Summer Nielsen appears stable.  She will complete another treatment with gemcitabine/Abraxane today.  She will undergo a restaging CT after this  cycle. Summer Nielsen will return for an office visit in 2 weeks.  Betsy Coder, MD  11/07/2018  10:28 AM

## 2018-11-20 ENCOUNTER — Ambulatory Visit
Admission: RE | Admit: 2018-11-20 | Discharge: 2018-11-20 | Disposition: A | Discharge status: Home / Self Care | Payer: Medicare Other | Source: Ambulatory Visit | Attending: Oncology | Admitting: Oncology

## 2018-11-20 ENCOUNTER — Other Ambulatory Visit: Payer: Self-pay

## 2018-11-20 DIAGNOSIS — C221 Intrahepatic bile duct carcinoma: Secondary | ICD-10-CM

## 2018-11-20 MED ORDER — IOPAMIDOL (ISOVUE-300) INJECTION 61%
80.0000 mL | Freq: Once | INTRAVENOUS | Status: AC | PRN
  Administered 2018-11-20: 80 mL via INTRAVENOUS

## 2018-11-21 ENCOUNTER — Telehealth: Payer: Self-pay | Admitting: Oncology

## 2018-11-21 ENCOUNTER — Inpatient Hospital Stay: Payer: Medicare Other

## 2018-11-21 ENCOUNTER — Inpatient Hospital Stay (HOSPITAL_BASED_OUTPATIENT_CLINIC_OR_DEPARTMENT_OTHER): Payer: Medicare Other | Admitting: Oncology

## 2018-11-21 ENCOUNTER — Other Ambulatory Visit: Payer: Self-pay

## 2018-11-21 VITALS — BP 142/58 | HR 58 | Temp 98.3°F | Resp 17 | Ht 62.0 in | Wt 138.2 lb

## 2018-11-21 DIAGNOSIS — Z23 Encounter for immunization: Secondary | ICD-10-CM | POA: Diagnosis not present

## 2018-11-21 DIAGNOSIS — C221 Intrahepatic bile duct carcinoma: Secondary | ICD-10-CM

## 2018-11-21 DIAGNOSIS — Z5111 Encounter for antineoplastic chemotherapy: Secondary | ICD-10-CM | POA: Diagnosis not present

## 2018-11-21 LAB — CBC WITH DIFFERENTIAL (CANCER CENTER ONLY)
Abs Immature Granulocytes: 0.02 10*3/uL (ref 0.00–0.07)
Basophils Absolute: 0 10*3/uL (ref 0.0–0.1)
Basophils Relative: 1 %
Eosinophils Absolute: 0.5 10*3/uL (ref 0.0–0.5)
Eosinophils Relative: 7 %
HCT: 29.9 % — ABNORMAL LOW (ref 36.0–46.0)
Hemoglobin: 9.8 g/dL — ABNORMAL LOW (ref 12.0–15.0)
Immature Granulocytes: 0 %
Lymphocytes Relative: 16 %
Lymphs Abs: 1.1 10*3/uL (ref 0.7–4.0)
MCH: 33.2 pg (ref 26.0–34.0)
MCHC: 32.8 g/dL (ref 30.0–36.0)
MCV: 101.4 fL — ABNORMAL HIGH (ref 80.0–100.0)
Monocytes Absolute: 1.1 10*3/uL — ABNORMAL HIGH (ref 0.1–1.0)
Monocytes Relative: 16 %
Neutro Abs: 4.1 10*3/uL (ref 1.7–7.7)
Neutrophils Relative %: 60 %
Platelet Count: 210 10*3/uL (ref 150–400)
RBC: 2.95 MIL/uL — ABNORMAL LOW (ref 3.87–5.11)
RDW: 18.4 % — ABNORMAL HIGH (ref 11.5–15.5)
WBC Count: 6.9 10*3/uL (ref 4.0–10.5)
nRBC: 0 % (ref 0.0–0.2)

## 2018-11-21 LAB — CMP (CANCER CENTER ONLY)
ALT: 19 U/L (ref 0–44)
AST: 21 U/L (ref 15–41)
Albumin: 3.7 g/dL (ref 3.5–5.0)
Alkaline Phosphatase: 71 U/L (ref 38–126)
Anion gap: 9 (ref 5–15)
BUN: 20 mg/dL (ref 8–23)
CO2: 27 mmol/L (ref 22–32)
Calcium: 9 mg/dL (ref 8.9–10.3)
Chloride: 102 mmol/L (ref 98–111)
Creatinine: 1.4 mg/dL — ABNORMAL HIGH (ref 0.44–1.00)
GFR, Est AFR Am: 39 mL/min — ABNORMAL LOW (ref >60)
GFR, Estimated: 33 mL/min — ABNORMAL LOW (ref >60)
Glucose, Bld: 116 mg/dL — ABNORMAL HIGH (ref 70–99)
Potassium: 4.3 mmol/L (ref 3.5–5.1)
Sodium: 138 mmol/L (ref 135–145)
Total Bilirubin: 0.4 mg/dL (ref 0.3–1.2)
Total Protein: 6.7 g/dL (ref 6.5–8.1)

## 2018-11-21 MED ORDER — INFLUENZA VAC A&B SA ADJ QUAD 0.5 ML IM PRSY
0.5000 mL | PREFILLED_SYRINGE | Freq: Once | INTRAMUSCULAR | Status: AC
  Administered 2018-11-21: 12:00:00 0.5 mL via INTRAMUSCULAR

## 2018-11-21 MED ORDER — SODIUM CHLORIDE 0.9% FLUSH
10.0000 mL | INTRAVENOUS | Status: DC | PRN
  Administered 2018-11-21: 12:00:00 10 mL via INTRAVENOUS
  Filled 2018-11-21: qty 10

## 2018-11-21 MED ORDER — HEPARIN SOD (PORK) LOCK FLUSH 100 UNIT/ML IV SOLN
500.0000 [IU] | Freq: Once | INTRAVENOUS | Status: AC
  Administered 2018-11-21: 500 [IU] via INTRAVENOUS
  Filled 2018-11-21: qty 5

## 2018-11-21 MED ORDER — INFLUENZA VAC A&B SA ADJ QUAD 0.5 ML IM PRSY
PREFILLED_SYRINGE | INTRAMUSCULAR | Status: AC
  Filled 2018-11-21: qty 0.5

## 2018-11-21 MED ORDER — SODIUM CHLORIDE 0.9% FLUSH
10.0000 mL | INTRAVENOUS | Status: DC | PRN
  Administered 2018-11-21: 10 mL
  Filled 2018-11-21: qty 10

## 2018-11-21 NOTE — Telephone Encounter (Signed)
Called and left msg. Mailed printout

## 2018-11-21 NOTE — Progress Notes (Signed)
USED SKIN PREP AND THE DRESSING STILL WOULDN'T STICK TO PT SO I SECURED THE DRESSING WITH PAPER TAPE.

## 2018-11-21 NOTE — Progress Notes (Signed)
[place] OFFICE PROGRESS NOTE   Diagnosis: Cholangiocarcinoma  INTERVAL HISTORY:   Ms. Fedorchak completed another treatment with gemcitabine/Abraxane on 11/07/2018.  No neuropathy symptoms.  She feels well.  No diarrhea except following oral contrast yesterday.  No abdominal pain.  Objective:  Vital signs in last 24 hours:  Blood pressure (!) 142/58, pulse (!) 58, temperature 98.3 F (36.8 C), temperature source Oral, resp. rate 17, height _0  (1.575 m), weight 138 lb 3.2 oz (62.7 kg), SpO2 100 %.   Limited physical examination secondary to distancing with the COVID pandemic GI: No hepatosplenomegaly, nontender, no mass Vascular: Trace lower leg edema bilaterally   Portacath/PICC-without erythema  Lab Results:  Lab Results  Component Value Date   WBC 6.9 11/21/2018   HGB 9.8 (L) 11/21/2018   HCT 29.9 (L) 11/21/2018   MCV 101.4 (H) 11/21/2018   PLT 210 11/21/2018   NEUTROABS 4.1 11/21/2018    CMP  Lab Results  Component Value Date   NA 139 11/07/2018   K 3.9 11/07/2018   CL 106 11/07/2018   CO2 23 11/07/2018   GLUCOSE 109 (H) 11/07/2018   BUN 23 11/07/2018   CREATININE 1.24 (H) 11/07/2018   CALCIUM 8.9 11/07/2018   PROT 7.0 11/07/2018   ALBUMIN 3.7 11/07/2018   AST 22 11/07/2018   ALT 20 11/07/2018   ALKPHOS 75 11/07/2018   BILITOT 0.4 11/07/2018   GFRNONAA 38 (L) 11/07/2018   GFRAA 45 (L) 11/07/2018     Imaging:  Ct Abdomen Pelvis W Contrast  Result Date: 11/20/2018 CLINICAL DATA:  Restaging cholangiocarcinoma, status post SBRT, chemotherapy EXAM: CT ABDOMEN AND PELVIS WITH CONTRAST TECHNIQUE: Multidetector CT imaging of the abdomen and pelvis was performed using the standard protocol following bolus administration of intravenous contrast. CONTRAST:  42m ISOVUE-300 IOPAMIDOL (ISOVUE-300) INJECTION 61%, additional oral enteric contrast COMPARISON:  08/31/2018, 06/12/2018 FINDINGS: Lower chest: Unchanged bibasilar pulmonary fibrosis.  Cardiomegaly and coronary artery calcifications Hepatobiliary: No significant change in a dominant lesion of the anterior right lobe of the liver, hepatic segment V, measuring 3.3 x 2.5 cm (series 2, image 18). Interval enlargement of multiple additional hypodense lesions of the right lobe of the liver, the largest located centrally measuring 2.1 x 1.8 cm, previously 1.5 x 1.5 cm (series 2, image 16, 14, 12). Status post cholecystectomy. Postoperative dilatation of the common bile duct. Pancreas: Unremarkable. No pancreatic ductal dilatation or surrounding inflammatory changes. Spleen: Normal in size without significant abnormality. Adrenals/Urinary Tract: Adrenal glands are unremarkable. Atrophic left kidney. The right kidney has multiple simple appearing cyst is otherwise normal. No hydronephrosis. Bladder is unremarkable. Stomach/Bowel: Stomach is within normal limits. Appendix is not clearly visualized. Previously noted colonic thickening is resolved. Sigmoid diverticulosis. Vascular/Lymphatic: Severe aortic atherosclerosis. No enlarged abdominal or pelvic lymph nodes. Reproductive: Status post hysterectomy. Other: No abdominal wall hernia or abnormality. No abdominopelvic ascites. Musculoskeletal: Left hip total arthroplasty with long stem femoral component and extensive streak artifact that limits assessment of the pelvis. Posterior lumbar discectomy and fusion of L5-S1. Redemonstrated wedge deformity of T12. IMPRESSION: 1. No significant change in a dominant lesion of the anterior right lobe of the liver, hepatic segment V, measuring 3.3 x 2.5 cm (series 2, image 18). Interval enlargement of multiple additional hypodense lesions of the right lobe of the liver, the largest located centrally measuring 2.1 x 1.8 cm, previously 1.5 x 1.5 cm (series 2, image 16, 14, 12). Findings are consistent with worsening malignancy. 2. No evidence of distant metastatic  disease in the abdomen or pelvis. 3. Previously noted  colonic thickening is resolved, consistent with resolved colitis. 4.  Pulmonary fibrosis. 5.  Aortic Atherosclerosis (ICD10-I70.0). 6. Other chronic, incidental, and postoperative findings as detailed above. Electronically Signed   By: Eddie Candle M.D.   On: 11/20/2018 16:56    Medications: I have reviewed the patient's current medications.   Assessment/Plan: 1. Anterior liver mass, ultrasound-guided biopsy 09/22/2017-adenocarcinoma, MSI-stable, tumor mutation burden 0,IDH1 R132G, BAP1 loss ? CT chest 09/07/2017, 4.1 cm hypodense anterior liver mass, changes of chronic hypersensitivity pneumonitis ? MRI abdomen 09/13/2017-isolated liver mass measuring 4.7 x 3.8 cm involving segments 4B and 5, no additional liver masses ? SBRT to the liver mass beginning 10/31/2017, completed 11/10/2017 ? CT abdomen/pelvis 01/23/2018-radiation changes in the liver, liver mass measuring 5.9 x 4.4 x 5.0 cm but no evidence of metastatic disease in the abdomen or pelvis ? MRI abdomen 02/22/2018-dominant central left liver lesion slightly increased in size with decreased central enhancement suggesting necrosis, 2 new lesions including an 11 mm left hepatic lesion and a 9 mm right hepatic lesion ? Xeloda 7 days on/7 days off beginning 03/27/2018 (1500 mg every morning and 1000 mg every afternoon) ? Xeloda dose reduced to 1000 mg every morning and 1000 mg every afternoon beginning 04/24/2018 due to diarrhea ? Xeloda discontinued after an office visit 05/15/2018 secondary to hand/foot syndrome and diarrhea ? CT abdomen/pelvis 06/12/2018-decrease in size of dominant left hepatic mass, no significant change in smaller liver lesions, stable biliary ductal dilatation, diffuse "colitis " ? CT 08/31/2018- decreased size of the segment 5 lesion, increased size of other liver lesions and new lesions, no evidence of extrahepatic metastatic disease, persistent colonic thickening ? Cycle 1 gemcitabine/Abraxane 09/12/2018 ? Cycle 2  gemcitabine/Abraxane 09/27/2018 ? Cycle 3 gemcitabine/Abraxane 10/10/2018 ? Cycle 4 gemcitabine/Abraxane 10/24/2018 ? Cycle 5 gemcitabine/Abraxane 11/07/2018 ? CT 11/20/2018, unchanged dominant segment 5 lesion, interval enlargement of local small hypodense lesions in the right liver, no evidence of distant metastatic disease, colonic wall thickening-resolved 2. Abdominal pain 3. History of diastolic heart failure 4. Hypertension 5. G3, P3 6. History of anemia 7. Right foot cellulitis 05/22/2018-Keflex prescribed 05/23/2018 8. C. difficile colitis 06/14/2018-treated with vancomycin, recurrent diarrhea 07/04/2018-second course of vancomycin (prolonged taper) prescribed, persistent diarrhea 08/06/2018-improved after diet change    Disposition: Ms. Agan has cholangiocarcinoma with multiple liver lesions.  She completed 5 cycles of gemcitabine/Abraxane.  She has tolerated the treatment well, but the CT yesterday reveals evidence of disease progression.  I discussed the CT findings and reviewed the images with Ms. Nuss.  I recommend discontinuing gemcitabine/Abraxane.  We discussed additional systemic treatment options including treatment directed at the IDH 1 mutation.  She understands the small chance of clinical benefit with standard third line chemotherapy and the limited predicted survival benefit with high cost associated with IDH1 directed therapy.  She is most comfortable with observation.  I will present her case at the GI tumor conference to confirm evidence of disease progression in the liver and discuss other treatment options. Ms. Kristensen will return for office visit and Port-A-Cath flush in 6 weeks.  She received an influenza vaccine today.  Betsy Coder, MD  11/21/2018  10:50 AM

## 2018-11-21 NOTE — Patient Instructions (Signed)

## 2018-11-30 ENCOUNTER — Telehealth: Payer: Self-pay | Admitting: Nurse Practitioner

## 2018-11-30 NOTE — Telephone Encounter (Signed)
Scheduled appt per 9/24 sch message - spoke with pt and she is aware of appt date and time

## 2018-12-01 ENCOUNTER — Other Ambulatory Visit: Payer: Self-pay | Admitting: Oncology

## 2018-12-05 ENCOUNTER — Inpatient Hospital Stay: Payer: Medicare Other

## 2018-12-05 ENCOUNTER — Other Ambulatory Visit: Payer: Self-pay | Admitting: *Deleted

## 2018-12-05 ENCOUNTER — Other Ambulatory Visit: Payer: Self-pay

## 2018-12-05 ENCOUNTER — Inpatient Hospital Stay: Payer: Medicare Other | Admitting: Nurse Practitioner

## 2018-12-05 ENCOUNTER — Encounter: Payer: Self-pay | Admitting: Nurse Practitioner

## 2018-12-05 VITALS — BP 151/61 | HR 72 | Temp 98.3°F | Resp 17 | Ht 62.0 in | Wt 138.7 lb

## 2018-12-05 DIAGNOSIS — C221 Intrahepatic bile duct carcinoma: Secondary | ICD-10-CM

## 2018-12-05 DIAGNOSIS — Z5111 Encounter for antineoplastic chemotherapy: Secondary | ICD-10-CM | POA: Diagnosis not present

## 2018-12-05 DIAGNOSIS — Z95828 Presence of other vascular implants and grafts: Secondary | ICD-10-CM

## 2018-12-05 LAB — CBC WITH DIFFERENTIAL (CANCER CENTER ONLY)
Abs Immature Granulocytes: 0.01 10*3/uL (ref 0.00–0.07)
Basophils Absolute: 0.1 10*3/uL (ref 0.0–0.1)
Basophils Relative: 1 %
Eosinophils Absolute: 0.4 10*3/uL (ref 0.0–0.5)
Eosinophils Relative: 5 %
HCT: 32.1 % — ABNORMAL LOW (ref 36.0–46.0)
Hemoglobin: 10.2 g/dL — ABNORMAL LOW (ref 12.0–15.0)
Immature Granulocytes: 0 %
Lymphocytes Relative: 16 %
Lymphs Abs: 1.2 10*3/uL (ref 0.7–4.0)
MCH: 33.4 pg (ref 26.0–34.0)
MCHC: 31.8 g/dL (ref 30.0–36.0)
MCV: 105.2 fL — ABNORMAL HIGH (ref 80.0–100.0)
Monocytes Absolute: 1 10*3/uL (ref 0.1–1.0)
Monocytes Relative: 12 %
Neutro Abs: 5.3 10*3/uL (ref 1.7–7.7)
Neutrophils Relative %: 66 %
Platelet Count: 272 10*3/uL (ref 150–400)
RBC: 3.05 MIL/uL — ABNORMAL LOW (ref 3.87–5.11)
RDW: 17 % — ABNORMAL HIGH (ref 11.5–15.5)
WBC Count: 7.9 10*3/uL (ref 4.0–10.5)
nRBC: 0 % (ref 0.0–0.2)

## 2018-12-05 LAB — BASIC METABOLIC PANEL - CANCER CENTER ONLY
Anion gap: 9 (ref 5–15)
BUN: 20 mg/dL (ref 8–23)
CO2: 23 mmol/L (ref 22–32)
Calcium: 7.2 mg/dL — ABNORMAL LOW (ref 8.9–10.3)
Chloride: 108 mmol/L (ref 98–111)
Creatinine: 1.03 mg/dL — ABNORMAL HIGH (ref 0.44–1.00)
GFR, Est AFR Am: 56 mL/min — ABNORMAL LOW (ref >60)
GFR, Estimated: 48 mL/min — ABNORMAL LOW (ref >60)
Glucose, Bld: 98 mg/dL (ref 70–99)
Potassium: 4.4 mmol/L (ref 3.5–5.1)
Sodium: 140 mmol/L (ref 135–145)

## 2018-12-05 LAB — CMP (CANCER CENTER ONLY)
ALT: 11 U/L (ref 0–44)
AST: 20 U/L (ref 15–41)
Albumin: 3.6 g/dL (ref 3.5–5.0)
Alkaline Phosphatase: 70 U/L (ref 38–126)
Anion gap: 10 (ref 5–15)
BUN: 21 mg/dL (ref 8–23)
CO2: 22 mmol/L (ref 22–32)
Calcium: 7.3 mg/dL — ABNORMAL LOW (ref 8.9–10.3)
Chloride: 108 mmol/L (ref 98–111)
Creatinine: 1.04 mg/dL — ABNORMAL HIGH (ref 0.44–1.00)
GFR, Est AFR Am: 55 mL/min — ABNORMAL LOW (ref >60)
GFR, Estimated: 48 mL/min — ABNORMAL LOW (ref >60)
Glucose, Bld: 100 mg/dL — ABNORMAL HIGH (ref 70–99)
Potassium: 4.4 mmol/L (ref 3.5–5.1)
Sodium: 140 mmol/L (ref 135–145)
Total Bilirubin: 0.5 mg/dL (ref 0.3–1.2)
Total Protein: 6.6 g/dL (ref 6.5–8.1)

## 2018-12-05 MED ORDER — SODIUM CHLORIDE 0.9 % IV SOLN
800.0000 mg/m2 | Freq: Once | INTRAVENOUS | Status: AC
  Administered 2018-12-05: 1330 mg via INTRAVENOUS
  Filled 2018-12-05: qty 34.98

## 2018-12-05 MED ORDER — PACLITAXEL PROTEIN-BOUND CHEMO INJECTION 100 MG
100.0000 mg/m2 | Freq: Once | INTRAVENOUS | Status: AC
  Administered 2018-12-05: 175 mg via INTRAVENOUS
  Filled 2018-12-05: qty 35

## 2018-12-05 MED ORDER — HEPARIN SOD (PORK) LOCK FLUSH 100 UNIT/ML IV SOLN
500.0000 [IU] | Freq: Once | INTRAVENOUS | Status: AC | PRN
  Administered 2018-12-05: 500 [IU]
  Filled 2018-12-05: qty 5

## 2018-12-05 MED ORDER — SODIUM CHLORIDE 0.9 % IV SOLN
Freq: Once | INTRAVENOUS | Status: AC
  Administered 2018-12-05: 15:00:00 via INTRAVENOUS
  Filled 2018-12-05: qty 250

## 2018-12-05 MED ORDER — SODIUM CHLORIDE 0.9% FLUSH
10.0000 mL | INTRAVENOUS | Status: DC | PRN
  Administered 2018-12-05: 17:00:00 10 mL
  Filled 2018-12-05: qty 10

## 2018-12-05 MED ORDER — PROCHLORPERAZINE MALEATE 10 MG PO TABS
5.0000 mg | ORAL_TABLET | Freq: Once | ORAL | Status: AC
  Administered 2018-12-05: 14:00:00 5 mg via ORAL

## 2018-12-05 MED ORDER — SODIUM CHLORIDE 0.9% FLUSH
10.0000 mL | Freq: Once | INTRAVENOUS | Status: AC
  Administered 2018-12-05: 12:00:00 10 mL via INTRAVENOUS
  Filled 2018-12-05: qty 10

## 2018-12-05 MED ORDER — PROCHLORPERAZINE MALEATE 10 MG PO TABS
ORAL_TABLET | ORAL | Status: AC
  Filled 2018-12-05: qty 1

## 2018-12-05 NOTE — Progress Notes (Signed)
[place] OFFICE PROGRESS NOTE   Diagnosis: Cholangiocarcinoma  INTERVAL HISTORY:   Summer Nielsen returns as scheduled.  She completed another cycle of gemcitabine/Abraxane on 11/07/2018.  Restaging CTs 11/20/2018 initially felt to show disease progression.  Further review shows stable disease.  She feels well.  Appetite is unchanged.  No nausea or vomiting.  No mouth sores.  No diarrhea.  No fever or rash.  No cough.  She has stable mild dyspnea on exertion.  No numbness or tingling in the hands or feet.  Objective:  Vital signs in last 24 hours:  Blood pressure (!) 151/61, pulse 72, temperature 98.3 F (36.8 C), temperature source Temporal, resp. rate 17, height _0  (1.575 m), weight 138 lb 11.2 oz (62.9 kg), SpO2 96 %.    GI: Abdomen soft and nontender.  No hepatomegaly.  No mass. Vascular: Trace edema at the lower legs bilaterally. Neuro: Alert and oriented. Skin: No rash. Port-A-Cath without erythema.   Lab Results:  Lab Results  Component Value Date   WBC 6.9 11/21/2018   HGB 9.8 (L) 11/21/2018   HCT 29.9 (L) 11/21/2018   MCV 101.4 (H) 11/21/2018   PLT 210 11/21/2018   NEUTROABS 4.1 11/21/2018    Imaging:  No results found.  Medications: I have reviewed the patient's current medications.  Assessment/Plan: 1. Anterior liver mass, ultrasound-guided biopsy 09/22/2017-adenocarcinoma, MSI-stable, tumor mutation burden 0,IDH1 R132G, BAP1 loss ? CT chest 09/07/2017, 4.1 cm hypodense anterior liver mass, changes of chronic hypersensitivity pneumonitis ? MRI abdomen 09/13/2017-isolated liver mass measuring 4.7 x 3.8 cm involving segments 4B and 5, no additional liver masses ? SBRT to the liver mass beginning 10/31/2017, completed 11/10/2017 ? CT abdomen/pelvis 01/23/2018-radiation changes in the liver, liver mass measuring 5.9 x 4.4 x 5.0 cm but no evidence of metastatic disease in the abdomen or pelvis ? MRI abdomen 02/22/2018-dominant central left liver  lesion slightly increased in size with decreased central enhancement suggesting necrosis, 2 new lesions including an 11 mm left hepatic lesion and a 9 mm right hepatic lesion ? Xeloda 7 days on/7 days off beginning 03/27/2018 (1500 mg every morning and 1000 mg every afternoon) ? Xeloda dose reduced to 1000 mg every morning and 1000 mg every afternoon beginning 04/24/2018 due to diarrhea ? Xeloda discontinued after an office visit 05/15/2018 secondary to hand/foot syndrome and diarrhea ? CT abdomen/pelvis 06/12/2018-decrease in size of dominant left hepatic mass, no significant change in smaller liver lesions, stable biliary ductal dilatation, diffuse "colitis " ? CT 08/31/2018-decreased size of the segment 5 lesion, increased size of other liver lesions and new lesions, no evidence of extrahepatic metastatic disease, persistent colonic thickening ? Cycle 1 gemcitabine/Abraxane 09/12/2018 ? Cycle 2 gemcitabine/Abraxane 09/27/2018 ? Cycle 3 gemcitabine/Abraxane 10/10/2018 ? Cycle 4 gemcitabine/Abraxane 10/24/2018 ? Cycle 5 gemcitabine/Abraxane 11/07/2018 ? CT 11/20/2018, unchanged dominant segment 5 lesion, interval enlargement of local small hypodense lesions in the right liver, no evidence of distant metastatic disease, colonic wall thickening-resolved ? CTs reviewed by Dr. Sherrill/radiologist, consistent with stable disease ? Cycle 6 gemcitabine/Abraxane 12/05/2018 2. Abdominal pain 3. History of diastolic heart failure 4. Hypertension 5. G3, P3 6. History of anemia 7. Right foot cellulitis 05/22/2018-Keflex prescribed 05/23/2018 8. C. difficile colitis 06/14/2018-treated with vancomycin, recurrent diarrhea 07/04/2018-second course of vancomycin (prolonged taper) prescribed, persistent diarrhea 08/06/2018-improved after diet change  Disposition: Summer Nielsen appears stable.  She has completed 5 cycles of gemcitabine/Abraxane.  Initial reading of the CTs from 11/20/2018 felt to be consistent with disease  progression.  Further review indicates stable disease.  Plan to proceed with cycle 6 gemcitabine/Abraxane today as scheduled.  We reviewed the CBC from today.  Counts adequate to proceed with treatment.  She will return for lab, follow-up, gemcitabine/Abraxane in 2 weeks.  She will contact the office in the interim with any problems.  Plan reviewed with Dr. Benay Spice.      Ned Card ANP/GNP-BC   12/05/2018  1:53 PM

## 2018-12-05 NOTE — Patient Instructions (Signed)
[facility] Discharge Instructions for Patients Receiving Chemotherapy  Today you received the following chemotherapy agents: Abraxane, Gemzar   To help prevent nausea and vomiting after your treatment, we encourage you to take your nausea medication as directed.    If you develop nausea and vomiting that is not controlled by your nausea medication, call the clinic.   BELOW ARE SYMPTOMS THAT SHOULD BE REPORTED IMMEDIATELY:  *FEVER GREATER THAN 100.5 F  *CHILLS WITH OR WITHOUT FEVER  NAUSEA AND VOMITING THAT IS NOT CONTROLLED WITH YOUR NAUSEA MEDICATION  *UNUSUAL SHORTNESS OF BREATH  *UNUSUAL BRUISING OR BLEEDING  TENDERNESS IN MOUTH AND THROAT WITH OR WITHOUT PRESENCE OF ULCERS  *URINARY PROBLEMS  *BOWEL PROBLEMS  UNUSUAL RASH Items with * indicate a potential emergency and should be followed up as soon as possible.  Feel free to call the clinic should you have any questions or concerns. The clinic phone number is [phone].  Please show the CHEMO ALERT CARD at check-in to the Emergency Department and triage nurse.   

## 2018-12-08 ENCOUNTER — Other Ambulatory Visit: Payer: Self-pay | Admitting: Oncology

## 2018-12-08 MED ORDER — HYDROMORPHONE HCL 2 MG PO TABS: 1.0000 mg | ORAL_TABLET | ORAL | 0 refills | Status: DC | PRN

## 2018-12-17 ENCOUNTER — Other Ambulatory Visit: Payer: Self-pay | Admitting: Oncology

## 2018-12-19 ENCOUNTER — Inpatient Hospital Stay: Payer: Medicare Other

## 2018-12-19 ENCOUNTER — Inpatient Hospital Stay: Payer: Medicare Other | Admitting: Nurse Practitioner

## 2018-12-19 ENCOUNTER — Encounter: Payer: Self-pay | Admitting: Nurse Practitioner

## 2018-12-19 ENCOUNTER — Inpatient Hospital Stay: Payer: Medicare Other | Attending: Oncology

## 2018-12-19 ENCOUNTER — Other Ambulatory Visit: Payer: Self-pay

## 2018-12-19 VITALS — BP 141/53 | HR 63 | Temp 98.0°F | Resp 17 | Ht 62.0 in | Wt 139.3 lb

## 2018-12-19 DIAGNOSIS — C221 Intrahepatic bile duct carcinoma: Secondary | ICD-10-CM | POA: Insufficient documentation

## 2018-12-19 DIAGNOSIS — I10 Essential (primary) hypertension: Secondary | ICD-10-CM | POA: Diagnosis not present

## 2018-12-19 DIAGNOSIS — Z5111 Encounter for antineoplastic chemotherapy: Secondary | ICD-10-CM | POA: Insufficient documentation

## 2018-12-19 DIAGNOSIS — Z95828 Presence of other vascular implants and grafts: Secondary | ICD-10-CM | POA: Insufficient documentation

## 2018-12-19 LAB — CBC WITH DIFFERENTIAL (CANCER CENTER ONLY)
Abs Immature Granulocytes: 0.02 10*3/uL (ref 0.00–0.07)
Basophils Absolute: 0 10*3/uL (ref 0.0–0.1)
Basophils Relative: 1 %
Eosinophils Absolute: 0.4 10*3/uL (ref 0.0–0.5)
Eosinophils Relative: 7 %
HCT: 29.9 % — ABNORMAL LOW (ref 36.0–46.0)
Hemoglobin: 9.8 g/dL — ABNORMAL LOW (ref 12.0–15.0)
Immature Granulocytes: 0 %
Lymphocytes Relative: 21 %
Lymphs Abs: 1.3 10*3/uL (ref 0.7–4.0)
MCH: 34.3 pg — ABNORMAL HIGH (ref 26.0–34.0)
MCHC: 32.8 g/dL (ref 30.0–36.0)
MCV: 104.5 fL — ABNORMAL HIGH (ref 80.0–100.0)
Monocytes Absolute: 1 10*3/uL (ref 0.1–1.0)
Monocytes Relative: 16 %
Neutro Abs: 3.4 10*3/uL (ref 1.7–7.7)
Neutrophils Relative %: 55 %
Platelet Count: 301 10*3/uL (ref 150–400)
RBC: 2.86 MIL/uL — ABNORMAL LOW (ref 3.87–5.11)
RDW: 15 % (ref 11.5–15.5)
WBC Count: 6 10*3/uL (ref 4.0–10.5)
nRBC: 0 % (ref 0.0–0.2)

## 2018-12-19 LAB — CMP (CANCER CENTER ONLY)
ALT: 15 U/L (ref 0–44)
AST: 22 U/L (ref 15–41)
Albumin: 3.6 g/dL (ref 3.5–5.0)
Alkaline Phosphatase: 76 U/L (ref 38–126)
Anion gap: 11 (ref 5–15)
BUN: 20 mg/dL (ref 8–23)
CO2: 22 mmol/L (ref 22–32)
Calcium: 7.6 mg/dL — ABNORMAL LOW (ref 8.9–10.3)
Chloride: 106 mmol/L (ref 98–111)
Creatinine: 1.18 mg/dL — ABNORMAL HIGH (ref 0.44–1.00)
GFR, Est AFR Am: 47 mL/min — ABNORMAL LOW (ref >60)
GFR, Estimated: 41 mL/min — ABNORMAL LOW (ref >60)
Glucose, Bld: 115 mg/dL — ABNORMAL HIGH (ref 70–99)
Potassium: 4.4 mmol/L (ref 3.5–5.1)
Sodium: 139 mmol/L (ref 135–145)
Total Bilirubin: 0.4 mg/dL (ref 0.3–1.2)
Total Protein: 6.8 g/dL (ref 6.5–8.1)

## 2018-12-19 MED ORDER — HEPARIN SOD (PORK) LOCK FLUSH 100 UNIT/ML IV SOLN
500.0000 [IU] | Freq: Once | INTRAVENOUS | Status: AC | PRN
  Administered 2018-12-19: 500 [IU]
  Filled 2018-12-19: qty 5

## 2018-12-19 MED ORDER — PACLITAXEL PROTEIN-BOUND CHEMO INJECTION 100 MG
100.0000 mg/m2 | Freq: Once | INTRAVENOUS | Status: AC
  Administered 2018-12-19: 175 mg via INTRAVENOUS
  Filled 2018-12-19: qty 35

## 2018-12-19 MED ORDER — SODIUM CHLORIDE 0.9% FLUSH
10.0000 mL | INTRAVENOUS | Status: DC | PRN
  Administered 2018-12-19: 10 mL
  Filled 2018-12-19: qty 10

## 2018-12-19 MED ORDER — SODIUM CHLORIDE 0.9 % IV SOLN
Freq: Once | INTRAVENOUS | Status: AC
  Administered 2018-12-19: 14:00:00 via INTRAVENOUS
  Filled 2018-12-19: qty 250

## 2018-12-19 MED ORDER — PROCHLORPERAZINE MALEATE 10 MG PO TABS
ORAL_TABLET | ORAL | Status: AC
  Filled 2018-12-19: qty 1

## 2018-12-19 MED ORDER — SODIUM CHLORIDE 0.9% FLUSH
10.0000 mL | INTRAVENOUS | Status: DC | PRN
  Administered 2018-12-19: 13:00:00 10 mL
  Filled 2018-12-19: qty 10

## 2018-12-19 MED ORDER — SODIUM CHLORIDE 0.9 % IV SOLN
800.0000 mg/m2 | Freq: Once | INTRAVENOUS | Status: AC
  Administered 2018-12-19: 1330 mg via INTRAVENOUS
  Filled 2018-12-19: qty 34.98

## 2018-12-19 MED ORDER — PROCHLORPERAZINE MALEATE 10 MG PO TABS
5.0000 mg | ORAL_TABLET | Freq: Once | ORAL | Status: AC
  Administered 2018-12-19: 15:00:00 5 mg via ORAL

## 2018-12-19 NOTE — Patient Instructions (Signed)
[facility] Discharge Instructions for Patients Receiving Chemotherapy  Today you received the following chemotherapy agents: Paclitaxel protein-bound (Abraxane) and Gemcitabine (Gemzar)  To help prevent nausea and vomiting after your treatment, we encourage you to take your nausea medication as directed.   If you develop nausea and vomiting that is not controlled by your nausea medication, call the clinic.   BELOW ARE SYMPTOMS THAT SHOULD BE REPORTED IMMEDIATELY:  *FEVER GREATER THAN 100.5 F  *CHILLS WITH OR WITHOUT FEVER  NAUSEA AND VOMITING THAT IS NOT CONTROLLED WITH YOUR NAUSEA MEDICATION  *UNUSUAL SHORTNESS OF BREATH  *UNUSUAL BRUISING OR BLEEDING  TENDERNESS IN MOUTH AND THROAT WITH OR WITHOUT PRESENCE OF ULCERS  *URINARY PROBLEMS  *BOWEL PROBLEMS  UNUSUAL RASH Items with * indicate a potential emergency and should be followed up as soon as possible.  Feel free to call the clinic should you have any questions or concerns. The clinic phone number is [phone].  Please show the [facility] at check-in to the Emergency Department and triage nurse.  Coronavirus (COVID-19) Are you at risk?  Are you at risk for the Coronavirus (COVID-19)?  To be considered HIGH RISK for Coronavirus (COVID-19), you have to meet the following criteria:  . Traveled to [place], [place], [place], [place] or [place]; or in the [place] to [place], [place], [place], or [place]; and have fever, cough, and shortness of breath within the last 2 weeks of travel OR . Been in close contact with a person diagnosed with COVID-19 within the last 2 weeks and have fever, cough, and shortness of breath . IF YOU DO NOT MEET THESE CRITERIA, YOU ARE CONSIDERED LOW RISK FOR COVID-19.  What to do if you are HIGH RISK for COVID-19?  Marland Kitchen If you are having a medical emergency, call 911. . Seek medical care right away. Before you go to a doctor's office, urgent care or emergency  department, call ahead and tell them about your recent travel, contact with someone diagnosed with COVID-19, and your symptoms. You should receive instructions from your physician's office regarding next steps of care.  . When you arrive at healthcare provider, tell the healthcare staff immediately you have returned from visiting [place], [place], [place], [place] or [place]; or traveled in the [place] to [place], [place], [place], or [place]; in the last two weeks or you have been in close contact with a person diagnosed with COVID-19 in the last 2 weeks.   . Tell the health care staff about your symptoms: fever, cough and shortness of breath. . After you have been seen by a medical provider, you will be either: o Tested for (COVID-19) and discharged home on quarantine except to seek medical care if symptoms worsen, and asked to  - Stay home and avoid contact with others until you get your results (4-5 days)  - Avoid travel on public transportation if possible (such as bus, train, or airplane) or o Sent to the Emergency Department by EMS for evaluation, COVID-19 testing, and possible admission depending on your condition and test results.  What to do if you are LOW RISK for COVID-19?  Reduce your risk of any infection by using the same precautions used for avoiding the common cold or flu:  Marland Kitchen Wash your hands often with soap and warm water for at least 20 seconds.  If soap and water are not readily available, use an alcohol-based hand sanitizer with at least 60% alcohol.  Marland Kitchen  If coughing or sneezing, cover your mouth and nose by coughing or sneezing into the elbow areas of your shirt or coat, into a tissue or into your sleeve (not your hands). . Avoid shaking hands with others and consider head nods or verbal greetings only. . Avoid touching your eyes, nose, or mouth with unwashed hands.  . Avoid close contact with people who are sick. . Avoid places or events with large numbers of people  in one location, like concerts or sporting events. . Carefully consider travel plans you have or are making. . If you are planning any travel outside or inside the [place], visit the CDC's Travelers' Health webpage for the latest health notices. . If you have some symptoms but not all symptoms, continue to monitor at home and seek medical attention if your symptoms worsen. . If you are having a medical emergency, call 911.   [facility] / e-Visit: [email or web address]         [facility]: [facility]: [phone]                   [facility]: [phone]

## 2018-12-19 NOTE — Progress Notes (Signed)
[facility] OFFICE PROGRESS NOTE   Diagnosis: Cholangiocarcinoma  INTERVAL HISTORY:   Summer Nielsen returns as scheduled.  She completed a cycle of gemcitabine/Abraxane on 12/05/2018.  She denies nausea/vomiting.  No mouth sores.  No diarrhea.  No fever or rash following treatment.  No cough.  She has stable dyspnea.  She describes her appetite as "pretty good".  She denies pain.  Objective:  Vital signs in last 24 hours:  Blood pressure (!) 141/53, pulse 63, temperature 98 F (36.7 C), resp. rate 17, height _0  (1.575 m), weight 139 lb 4.8 oz (63.2 kg), SpO2 95 %.    HEENT: No thrush or ulcers. GI: Abdomen soft and nontender.  No hepatomegaly. Vascular: Trace edema at the lower legs/ankles bilaterally. Neuro: Alert and oriented. Skin: No rash. Port-A-Cath without erythema.   Lab Results:  Lab Results  Component Value Date   WBC 6.0 12/19/2018   HGB 9.8 (L) 12/19/2018   HCT 29.9 (L) 12/19/2018   MCV 104.5 (H) 12/19/2018   PLT 301 12/19/2018   NEUTROABS 3.4 12/19/2018    Imaging:  No results found.  Medications: I have reviewed the patient's current medications.  Assessment/Plan: 1. Anterior liver mass, ultrasound-guided biopsy 09/22/2017-adenocarcinoma, MSI-stable, tumor mutation burden 0,IDH1 R132G, BAP1 loss ? CT chest 09/07/2017, 4.1 cm hypodense anterior liver mass, changes of chronic hypersensitivity pneumonitis ? MRI abdomen 09/13/2017-isolated liver mass measuring 4.7 x 3.8 cm involving segments 4B and 5, no additional liver masses ? SBRT to the liver mass beginning 10/31/2017, completed 11/10/2017 ? CT abdomen/pelvis 01/23/2018-radiation changes in the liver, liver mass measuring 5.9 x 4.4 x 5.0 cm but no evidence of metastatic disease in the abdomen or pelvis ? MRI abdomen 02/22/2018-dominant central left liver lesion slightly increased in size with decreased central enhancement suggesting necrosis, 2 new lesions including an 11 mm left hepatic  lesion and a 9 mm right hepatic lesion ? Xeloda 7 days on/7 days off beginning 03/27/2018 (1500 mg every morning and 1000 mg every afternoon) ? Xeloda dose reduced to 1000 mg every morning and 1000 mg every afternoon beginning 04/24/2018 due to diarrhea ? Xeloda discontinued after an office visit 05/15/2018 secondary to hand/foot syndrome and diarrhea ? CT abdomen/pelvis 06/12/2018-decrease in size of dominant left hepatic mass, no significant change in smaller liver lesions, stable biliary ductal dilatation, diffuse "colitis " ? CT 08/31/2018-decreased size of the segment 5 lesion, increased size of other liver lesions and new lesions, no evidence of extrahepatic metastatic disease, persistent colonic thickening ? Cycle 1 gemcitabine/Abraxane 09/12/2018 ? Cycle 2 gemcitabine/Abraxane 09/27/2018 ? Cycle 3 gemcitabine/Abraxane 10/10/2018 ? Cycle 4 gemcitabine/Abraxane 10/24/2018 ? Cycle 5 gemcitabine/Abraxane 11/07/2018 ? CT 11/20/2018, unchanged dominant segment 5 lesion, interval enlargement of local small hypodense lesions in the right liver, no evidence of distant metastatic disease, colonic wall thickening-resolved ? CTs reviewed by Dr. Sherrill/radiologist, consistent with stable disease ? Cycle 6 gemcitabine/Abraxane 12/05/2018 ? Cycle 7 gemcitabine/Abraxane 12/19/2018 2. Abdominal pain 3. History of diastolic heart failure 4. Hypertension 5. G3, P3 6. History of anemia 7. Right foot cellulitis 05/22/2018-Keflex prescribed 05/23/2018 8. C. difficile colitis 06/14/2018-treated with vancomycin, recurrent diarrhea 07/04/2018-second course of vancomycin (prolonged taper) prescribed, persistent diarrhea 08/06/2018-improved after diet change  Disposition: [name] appears stable.  She has completed 6 cycles of gemcitabine/Abraxane.  Plan to proceed with cycle 7 today as scheduled.  We reviewed the CBC from today.  Counts adequate to proceed with treatment.  She will return for lab, follow-up, cycle 8  gemcitabine/Abraxane  in 2 weeks.  She will contact the office in the interim with any problems.    Ned Card ANP/GNP-BC   12/19/2018  1:56 PM

## 2018-12-20 ENCOUNTER — Telehealth: Payer: Self-pay

## 2018-12-20 LAB — CANCER ANTIGEN 19-9: CA 19-9: 43 U/mL — ABNORMAL HIGH (ref 0–35)

## 2018-12-20 NOTE — Telephone Encounter (Signed)
TC to pt per Summer Card NP to let her know the CA-19-9 tumor marker is stable. Patient verbalized understanding. No further problems or concerns at this time.

## 2018-12-22 ENCOUNTER — Ambulatory Visit: Payer: Self-pay | Admitting: Specialist

## 2018-12-30 ENCOUNTER — Other Ambulatory Visit: Payer: Self-pay | Admitting: Oncology

## 2019-01-02 ENCOUNTER — Inpatient Hospital Stay: Payer: Medicare Other

## 2019-01-02 ENCOUNTER — Other Ambulatory Visit: Payer: Self-pay

## 2019-01-02 ENCOUNTER — Ambulatory Visit: Payer: Medicare Other | Admitting: Oncology

## 2019-01-02 ENCOUNTER — Other Ambulatory Visit: Payer: Medicare Other

## 2019-01-02 ENCOUNTER — Inpatient Hospital Stay: Payer: Medicare Other | Admitting: Oncology

## 2019-01-02 VITALS — BP 167/66 | HR 64 | Temp 98.3°F | Resp 18 | Ht 62.0 in | Wt 138.4 lb

## 2019-01-02 DIAGNOSIS — C221 Intrahepatic bile duct carcinoma: Secondary | ICD-10-CM

## 2019-01-02 DIAGNOSIS — Z5111 Encounter for antineoplastic chemotherapy: Secondary | ICD-10-CM | POA: Diagnosis not present

## 2019-01-02 DIAGNOSIS — Z95828 Presence of other vascular implants and grafts: Secondary | ICD-10-CM

## 2019-01-02 LAB — CMP (CANCER CENTER ONLY)
ALT: 20 U/L (ref 0–44)
AST: 20 U/L (ref 15–41)
Albumin: 3.5 g/dL (ref 3.5–5.0)
Alkaline Phosphatase: 79 U/L (ref 38–126)
Anion gap: 10 (ref 5–15)
BUN: 20 mg/dL (ref 8–23)
CO2: 22 mmol/L (ref 22–32)
Calcium: 8 mg/dL — ABNORMAL LOW (ref 8.9–10.3)
Chloride: 108 mmol/L (ref 98–111)
Creatinine: 1.17 mg/dL — ABNORMAL HIGH (ref 0.44–1.00)
GFR, Est AFR Am: 48 mL/min — ABNORMAL LOW (ref >60)
GFR, Estimated: 41 mL/min — ABNORMAL LOW (ref >60)
Glucose, Bld: 111 mg/dL — ABNORMAL HIGH (ref 70–99)
Potassium: 4.1 mmol/L (ref 3.5–5.1)
Sodium: 140 mmol/L (ref 135–145)
Total Bilirubin: 0.5 mg/dL (ref 0.3–1.2)
Total Protein: 6.8 g/dL (ref 6.5–8.1)

## 2019-01-02 LAB — CBC WITH DIFFERENTIAL (CANCER CENTER ONLY)
Abs Immature Granulocytes: 0.02 10*3/uL (ref 0.00–0.07)
Basophils Absolute: 0 10*3/uL (ref 0.0–0.1)
Basophils Relative: 1 %
Eosinophils Absolute: 0.3 10*3/uL (ref 0.0–0.5)
Eosinophils Relative: 6 %
HCT: 30.6 % — ABNORMAL LOW (ref 36.0–46.0)
Hemoglobin: 10.2 g/dL — ABNORMAL LOW (ref 12.0–15.0)
Immature Granulocytes: 0 %
Lymphocytes Relative: 17 %
Lymphs Abs: 1 10*3/uL (ref 0.7–4.0)
MCH: 33.9 pg (ref 26.0–34.0)
MCHC: 33.3 g/dL (ref 30.0–36.0)
MCV: 101.7 fL — ABNORMAL HIGH (ref 80.0–100.0)
Monocytes Absolute: 1 10*3/uL (ref 0.1–1.0)
Monocytes Relative: 16 %
Neutro Abs: 3.6 10*3/uL (ref 1.7–7.7)
Neutrophils Relative %: 60 %
Platelet Count: 223 10*3/uL (ref 150–400)
RBC: 3.01 MIL/uL — ABNORMAL LOW (ref 3.87–5.11)
RDW: 14.6 % (ref 11.5–15.5)
WBC Count: 6 10*3/uL (ref 4.0–10.5)
nRBC: 0 % (ref 0.0–0.2)

## 2019-01-02 MED ORDER — PROCHLORPERAZINE MALEATE 10 MG PO TABS
5.0000 mg | ORAL_TABLET | Freq: Once | ORAL | Status: AC
  Administered 2019-01-02: 5 mg via ORAL

## 2019-01-02 MED ORDER — PROCHLORPERAZINE MALEATE 10 MG PO TABS
ORAL_TABLET | ORAL | Status: AC
  Filled 2019-01-02: qty 1

## 2019-01-02 MED ORDER — SODIUM CHLORIDE 0.9 % IV SOLN
800.0000 mg/m2 | Freq: Once | INTRAVENOUS | Status: AC
  Administered 2019-01-02: 1330 mg via INTRAVENOUS
  Filled 2019-01-02: qty 34.98

## 2019-01-02 MED ORDER — SODIUM CHLORIDE 0.9 % IV SOLN
Freq: Once | INTRAVENOUS | Status: AC
  Administered 2019-01-02: 11:00:00 via INTRAVENOUS
  Filled 2019-01-02: qty 250

## 2019-01-02 MED ORDER — HEPARIN SOD (PORK) LOCK FLUSH 100 UNIT/ML IV SOLN
500.0000 [IU] | Freq: Once | INTRAVENOUS | Status: AC | PRN
  Administered 2019-01-02: 500 [IU]
  Filled 2019-01-02: qty 5

## 2019-01-02 MED ORDER — SODIUM CHLORIDE 0.9% FLUSH
10.0000 mL | INTRAVENOUS | Status: DC | PRN
  Administered 2019-01-02: 10 mL
  Filled 2019-01-02: qty 10

## 2019-01-02 MED ORDER — PACLITAXEL PROTEIN-BOUND CHEMO INJECTION 100 MG
100.0000 mg/m2 | Freq: Once | INTRAVENOUS | Status: AC
  Administered 2019-01-02: 175 mg via INTRAVENOUS
  Filled 2019-01-02: qty 35

## 2019-01-02 NOTE — Progress Notes (Signed)
[place] OFFICE PROGRESS NOTE   Diagnosis: Cholangiocarcinoma  INTERVAL HISTORY:   [name] completed another treatment with gemcitabine/Abraxane on 12/19/2018.  No pain, nausea, or neuropathy symptoms.  She reports malaise.  Objective:  Vital signs in last 24 hours:  Blood pressure (!) 167/66, pulse 64, temperature 98.3 F (36.8 C), temperature source Temporal, resp. rate 18, height _0  (1.575 m), weight 138 lb 6.4 oz (62.8 kg), SpO2 98 %.    HEENT: No thrush or ulcers GI: No hepatosplenomegaly, no mass, nontender Vascular: Trace edema in the right greater than left lower leg   Portacath/PICC-without erythema  Lab Results:  Lab Results  Component Value Date   WBC 6.0 01/02/2019   HGB 10.2 (L) 01/02/2019   HCT 30.6 (L) 01/02/2019   MCV 101.7 (H) 01/02/2019   PLT 223 01/02/2019   NEUTROABS 3.6 01/02/2019    CMP  Lab Results  Component Value Date   NA 140 01/02/2019   K 4.1 01/02/2019   CL 108 01/02/2019   CO2 22 01/02/2019   GLUCOSE 111 (H) 01/02/2019   BUN 20 01/02/2019   CREATININE 1.17 (H) 01/02/2019   CALCIUM 8.0 (L) 01/02/2019   PROT 6.8 01/02/2019   ALBUMIN 3.5 01/02/2019   AST 20 01/02/2019   ALT 20 01/02/2019   ALKPHOS 79 01/02/2019   BILITOT 0.5 01/02/2019   GFRNONAA 41 (L) 01/02/2019   GFRAA 48 (L) 01/02/2019    Lab Results  Component Value Date   CEA1 1.96 03/21/2018    Medications: I have reviewed the patient's current medications.   Assessment/Plan: 1. Anterior liver mass, ultrasound-guided biopsy 09/22/2017-adenocarcinoma, MSI-stable, tumor mutation burden 0,IDH1 R132G, BAP1 loss ? CT chest 09/07/2017, 4.1 cm hypodense anterior liver mass, changes of chronic hypersensitivity pneumonitis ? MRI abdomen 09/13/2017-isolated liver mass measuring 4.7 x 3.8 cm involving segments 4B and 5, no additional liver masses ? SBRT to the liver mass beginning 10/31/2017, completed 11/10/2017 ? CT abdomen/pelvis 01/23/2018-radiation  changes in the liver, liver mass measuring 5.9 x 4.4 x 5.0 cm but no evidence of metastatic disease in the abdomen or pelvis ? MRI abdomen 02/22/2018-dominant central left liver lesion slightly increased in size with decreased central enhancement suggesting necrosis, 2 new lesions including an 11 mm left hepatic lesion and a 9 mm right hepatic lesion ? Xeloda 7 days on/7 days off beginning 03/27/2018 (1500 mg every morning and 1000 mg every afternoon) ? Xeloda dose reduced to 1000 mg every morning and 1000 mg every afternoon beginning 04/24/2018 due to diarrhea ? Xeloda discontinued after an office visit 05/15/2018 secondary to hand/foot syndrome and diarrhea ? CT abdomen/pelvis 06/12/2018-decrease in size of dominant left hepatic mass, no significant change in smaller liver lesions, stable biliary ductal dilatation, diffuse "colitis " ? CT 08/31/2018-decreased size of the segment 5 lesion, increased size of other liver lesions and new lesions, no evidence of extrahepatic metastatic disease, persistent colonic thickening ? Cycle 1 gemcitabine/Abraxane 09/12/2018 ? Cycle 2 gemcitabine/Abraxane 09/27/2018 ? Cycle 3 gemcitabine/Abraxane 10/10/2018 ? Cycle 4 gemcitabine/Abraxane 10/24/2018 ? Cycle 5 gemcitabine/Abraxane 11/07/2018 ? CT 11/20/2018, unchanged dominant segment 5 lesion, interval enlargement of local small hypodense lesions in the right liver, no evidence of distant metastatic disease, colonic wall thickening-resolved ? CTs reviewed by Dr. Shalece Staffa/radiologist, consistent with stable disease ? Cycle 6 gemcitabine/Abraxane 12/05/2018 ? Cycle 7 gemcitabine/Abraxane 12/19/2018 ? Cycle 8 gemcitabine/Abraxane 01/02/2019 2. Abdominal pain 3. History of diastolic heart failure 4. Hypertension 5. G3, P3 6. History of anemia 7. Right foot  cellulitis 05/22/2018-Keflex prescribed 05/23/2018 8. C. difficile colitis 06/14/2018-treated with vancomycin, recurrent diarrhea 07/04/2018-second course of vancomycin  (prolonged taper) prescribed, persistent diarrhea 08/06/2018-improved after diet change    Disposition: Summer Nielsen appears stable.  She is tolerating the chemotherapy well.  She will complete another cycle of gemcitabine/Abraxane today.  She will return for an office visit and chemotherapy in 2 weeks.  Summer Nielsen will be referred for a restaging CT after 10 cycles of gemcitabine/Abraxane.  Betsy Coder, MD  01/02/2019  10:21 AM

## 2019-01-02 NOTE — Patient Instructions (Signed)

## 2019-01-02 NOTE — Patient Instructions (Signed)
[facility] Discharge Instructions for Patients Receiving Chemotherapy  Today you received the following chemotherapy agents: Paclitaxel protein-bound (Abraxane) and Gemcitabine (Gemzar)  To help prevent nausea and vomiting after your treatment, we encourage you to take your nausea medication as directed.   If you develop nausea and vomiting that is not controlled by your nausea medication, call the clinic.   BELOW ARE SYMPTOMS THAT SHOULD BE REPORTED IMMEDIATELY:  *FEVER GREATER THAN 100.5 F  *CHILLS WITH OR WITHOUT FEVER  NAUSEA AND VOMITING THAT IS NOT CONTROLLED WITH YOUR NAUSEA MEDICATION  *UNUSUAL SHORTNESS OF BREATH  *UNUSUAL BRUISING OR BLEEDING  TENDERNESS IN MOUTH AND THROAT WITH OR WITHOUT PRESENCE OF ULCERS  *URINARY PROBLEMS  *BOWEL PROBLEMS  UNUSUAL RASH Items with * indicate a potential emergency and should be followed up as soon as possible.  Feel free to call the clinic should you have any questions or concerns. The clinic phone number is [phone].  Please show the CHEMO ALERT CARD at check-in to the Emergency Department and triage nurse.  Coronavirus (COVID-19) Are you at risk?  Are you at risk for the Coronavirus (COVID-19)?  To be considered HIGH RISK for Coronavirus (COVID-19), you have to meet the following criteria:  . Traveled to [place], [place], [place], [place] or [place]; or in the [place] to [place], [place], [place], or [place]; and have fever, cough, and shortness of breath within the last 2 weeks of travel OR . Been in close contact with a person diagnosed with COVID-19 within the last 2 weeks and have fever, cough, and shortness of breath . IF YOU DO NOT MEET THESE CRITERIA, YOU ARE CONSIDERED LOW RISK FOR COVID-19.  What to do if you are HIGH RISK for COVID-19?  . If you are having a medical emergency, call 911. . Seek medical care right away. Before you go to a doctor's office, urgent care or emergency  department, call ahead and tell them about your recent travel, contact with someone diagnosed with COVID-19, and your symptoms. You should receive instructions from your physician's office regarding next steps of care.  . When you arrive at healthcare provider, tell the healthcare staff immediately you have returned from visiting [place], [place], [place], [place] or [place]; or traveled in the [place] to [place], [place], [place], or [place]; in the last two weeks or you have been in close contact with a person diagnosed with COVID-19 in the last 2 weeks.   . Tell the health care staff about your symptoms: fever, cough and shortness of breath. . After you have been seen by a medical provider, you will be either: o Tested for (COVID-19) and discharged home on quarantine except to seek medical care if symptoms worsen, and asked to  - Stay home and avoid contact with others until you get your results (4-5 days)  - Avoid travel on public transportation if possible (such as bus, train, or airplane) or o Sent to the Emergency Department by EMS for evaluation, COVID-19 testing, and possible admission depending on your condition and test results.  What to do if you are LOW RISK for COVID-19?  Reduce your risk of any infection by using the same precautions used for avoiding the common cold or flu:  . Wash your hands often with soap and warm water for at least 20 seconds.  If soap and water are not readily available, use an alcohol-based hand sanitizer with at least 60% alcohol.  .   If coughing or sneezing, cover your mouth and nose by coughing or sneezing into the elbow areas of your shirt or coat, into a tissue or into your sleeve (not your hands). . Avoid shaking hands with others and consider head nods or verbal greetings only. . Avoid touching your eyes, nose, or mouth with unwashed hands.  . Avoid close contact with people who are sick. . Avoid places or events with large numbers of people  in one location, like concerts or sporting events. . Carefully consider travel plans you have or are making. . If you are planning any travel outside or inside the [place], visit the CDC's Travelers' Health webpage for the latest health notices. . If you have some symptoms but not all symptoms, continue to monitor at home and seek medical attention if your symptoms worsen. . If you are having a medical emergency, call 911.   ADDITIONAL HEALTHCARE OPTIONS FOR PATIENTS  [facility] / e-Visit: [email or web address]         [facility]: [phone]  [facility]: [phone]                   [facility]: [phone]   

## 2019-01-03 LAB — CANCER ANTIGEN 19-9: CA 19-9: 52 U/mL — ABNORMAL HIGH (ref 0–35)

## 2019-01-04 ENCOUNTER — Other Ambulatory Visit: Payer: Self-pay | Admitting: Nurse Practitioner

## 2019-01-04 DIAGNOSIS — C221 Intrahepatic bile duct carcinoma: Secondary | ICD-10-CM

## 2019-01-11 NOTE — Progress Notes (Deleted)
CARDIOLOGY OFFICE NOTE  Date:  01/11/2019    [name] Date of Birth: 12/04/1927 Medical Record #[id number]  PCP:  Maurice Small, MD  Cardiologist:  Marisa Cyphers  No chief complaint on file.   History of Present Illness: Summer Nielsen is a 85 y.o. female who presents today for a follow up visit.  Seen for Dr. Marlou Porch.   She has a hx ofacute diastolic heart failure with elevated BNP, grade 2 diastolic dysfunction on echocardiogram,and prior pulmonary infiltrate.  In July 2017, she began having more shortness of breath and was diagnosed originally with pneumonia. No fever, no chills. Treated with antibiotic course. She never improved that much. Dr. Justin Mend checked an echocardiogram which showed grade 2 diastolic dysfunction and BNP which was 200. She placed her on Lasix 40 mg once a day. Her lower extremity edema improved. Her breathing improved to a certain degree. She has had some bradycardia with Inderal noted. There has been concern for potential pulmonary fibrosis. She has had a liver mass noted last year in 2019 - found to have cancer - treated with XRT and chemotherapy.   I last saw her in August - she was doing ok - was on IV chemo for her liver cancer - pretty short of breath. Was still able to do most of her own ADLs and was also taking care of her 56 year old husband.   The patient {does/does not:200015} have symptoms concerning for COVID-19 infection (fever, chills, cough, or new shortness of breath).   Comes in today. Here with   Past Medical History:  Diagnosis Date  . Cancer ([facility])    squamous cell skin cancer on left leg-removed  . CHF (congestive heart failure) ([facility])   . Cholangiocarcinoma ([facility]) dx'd 11/2017  . Complication of anesthesia   . GERD (gastroesophageal reflux disease)   . Glaucoma   . High blood pressure   . Hyperlipidemia   . Hypothyroidism   . OP (osteoporosis)   . PONV (postoperative nausea and vomiting)   . Postsurgical  hypothyroidism   . 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 Tachycardia   . TIA (transient ischemic attack)     Past Surgical History:  Procedure Laterality Date  . ABDOMINAL HYSTERECTOMY    . BACK SURGERY N/A 03/2017   Lumbar  . CHOLECYSTECTOMY    . EYE SURGERY     removed cateracts on both eyes  . IR IMAGING GUIDED PORT INSERTION  09/06/2018  . JOINT REPLACEMENT     3 hip replacement on left, right knee replacement  . THYROIDECTOMY       Medications: No outpatient medications have been marked as taking for the 01/17/19 encounter (Appointment) with Burtis Junes, NP.     Allergies: Allergies  Allergen Reactions  . Levofloxacin Nausea Only    Dizzy, hot, foot pain, insomnia  . Percocet [Oxycodone-Acetaminophen] Nausea And Vomiting and Other (See Comments)    sick    Social History: The patient  reports that she has never smoked. She has never used smokeless tobacco. She reports that she does not drink alcohol or use drugs.   Family History: The patient's ***family history includes Cerebral aneurysm in her mother; Heart attack in her father and mother; Heart disease in her brother and sister; Hypertension in her mother; Stroke in her mother.   Review of Systems: Please see the history of present illness.   All other systems are reviewed and negative.  Physical Exam: VS:  LMP  (LMP Unknown)  .  BMI There is no height or weight on file to calculate BMI.  Wt Readings from Last 3 Encounters:  01/02/19 138 lb 6.4 oz (62.8 kg)  12/19/18 139 lb 4.8 oz (63.2 kg)  12/05/18 138 lb 11.2 oz (62.9 kg)    General: Pleasant. Well developed, well nourished and in no acute distress.   HEENT: Normal.  Neck: Supple, no JVD, carotid bruits, or masses noted.  Cardiac: ***Regular rate and rhythm. No murmurs, rubs, or gallops. No edema.  Respiratory:  Lungs are clear to auscultation bilaterally with normal work of breathing.  GI: Soft and nontender.   MS: No deformity or atrophy. Gait and ROM intact.  Skin: Warm and dry. Color is normal.  Neuro:  Strength and sensation are intact and no gross focal deficits noted.  Psych: Alert, appropriate and with normal affect.   LABORATORY DATA:  EKG:  EKG {ACTION; IS/IS [id number]} ordered today. This demonstrates ***.  Lab Results  Component Value Date   WBC 6.0 01/02/2019   HGB 10.2 (L) 01/02/2019   HCT 30.6 (L) 01/02/2019   PLT 223 01/02/2019   GLUCOSE 111 (H) 01/02/2019   CHOL 164 03/23/2018   TRIG 169 (H) 03/23/2018   HDL 45 03/23/2018   LDLCALC 85 03/23/2018   ALT 20 01/02/2019   AST 20 01/02/2019   NA 140 01/02/2019   K 4.1 01/02/2019   CL 108 01/02/2019   CREATININE 1.17 (H) 01/02/2019   BUN 20 01/02/2019   CO2 22 01/02/2019   TSH 9.900 (H) 08/08/2017   INR 1.1 09/06/2018       BNP (last 3 results) No results for input(s): BNP in the last 8760 hours.  ProBNP (last 3 results) No results for input(s): PROBNP in the last 8760 hours.   Other Studies Reviewed Today:  Myoview Study Highlights 06/2016    Nuclear stress EF: 71%.  No T wave inversion was noted during stress.  There was no ST segment deviation noted during stress.  This is a low risk study.  No reversible ischemia. LVEF 71% with normal wall motion. This is a low risk study.    ECHO Study Conclusions 05/2016  - Left ventricle: The cavity size was normal. Systolic function was normal. The estimated ejection fraction was in the range of 55% to 60%. Wall motion was normal; there were no regional wall motion abnormalities. There was a reduced contribution of atrial contraction to ventricular filling, due to increased ventricular diastolic pressure or atrial contractile dysfunction. Features are consistent with a pseudonormal left ventricular filling pattern, with concomitant abnormal relaxation and increased filling pressure (grade 2 diastolic dysfunction). - Aortic valve:  Trileaflet; mildly thickened, mildly calcified leaflets. There was trivial regurgitation. - Mitral valve: Calcified annulus. Valve area by pressure half-time: 1.45 cm^2. - Left atrium: The atrium was moderately dilated. - Pulmonary arteries: PA peak pressure: 35 mm Hg (S).    ASSESSMENT AND PLAN:  1.Chronic diastolic HF - worsening DOE - most likely multifactorial with chemo/abnormal chest CT/anemia - will get her echo updated to make sure we do not have a change. Try to restrict salt.   2. Chronic DOE - looked to have chronic hypersensitivity pneumonitis on prior CT - she saw pulmonary - does not wish to go back - actually never had treatment due to that CT showing her liver cancer. She is going to speak with her PCP about options. We will make sure her echo looks  ok.   3. HTN - BP ok - I have asked her to monitor - as she loses weight she may need reduction in medicines.   4. HLD - remains on statin - has normal LFTs - may need to consider stopping  5. Liver cancer - followed by Oncology - now on IV chemo  6. Chronic resting bradycardia - HR is 52. She is not symptomatic with this - will need to follow.   [facility] COVID-19 Education: The signs and symptoms of COVID-19 were discussed with the patient and how to seek care for testing (follow up with PCP or arrange E-visit).  The importance of social distancing, staying at home, hand hygiene and wearing a mask when out in public were discussed today.  Current medicines are reviewed with the patient today.  The patient does not have concerns regarding medicines other than what has been noted above.  The following changes have been made:  See above.  Labs/ tests ordered today include:   No orders of the defined types were placed in this encounter.    Disposition:   FU with *** in {gen number 8-12:751700} {Days to years:10300}.   Patient is agreeable to this plan and will call if any problems develop in the interim.    SignedTruitt Merle, NP  01/11/2019 6:14 PM  [facility] [address] [place], [place]  [address] Phone: [phone] Fax: [phone]

## 2019-01-14 ENCOUNTER — Other Ambulatory Visit: Payer: Self-pay | Admitting: Oncology

## 2019-01-16 ENCOUNTER — Inpatient Hospital Stay: Payer: Medicare Other

## 2019-01-16 ENCOUNTER — Other Ambulatory Visit: Payer: Self-pay

## 2019-01-16 ENCOUNTER — Inpatient Hospital Stay: Payer: Medicare Other | Attending: Oncology | Admitting: Oncology

## 2019-01-16 VITALS — BP 152/88 | HR 63 | Temp 98.7°F | Resp 17 | Ht 62.0 in | Wt 136.1 lb

## 2019-01-16 DIAGNOSIS — C221 Intrahepatic bile duct carcinoma: Secondary | ICD-10-CM

## 2019-01-16 DIAGNOSIS — I10 Essential (primary) hypertension: Secondary | ICD-10-CM | POA: Insufficient documentation

## 2019-01-16 DIAGNOSIS — Z79899 Other long term (current) drug therapy: Secondary | ICD-10-CM | POA: Diagnosis not present

## 2019-01-16 DIAGNOSIS — Z5111 Encounter for antineoplastic chemotherapy: Secondary | ICD-10-CM | POA: Diagnosis not present

## 2019-01-16 DIAGNOSIS — Z95828 Presence of other vascular implants and grafts: Secondary | ICD-10-CM

## 2019-01-16 LAB — CBC WITH DIFFERENTIAL (CANCER CENTER ONLY)
Abs Immature Granulocytes: 0.04 K/uL (ref 0.00–0.07)
Basophils Absolute: 0 K/uL (ref 0.0–0.1)
Basophils Relative: 1 %
Eosinophils Absolute: 0.2 K/uL (ref 0.0–0.5)
Eosinophils Relative: 3 %
HCT: 31.6 % — ABNORMAL LOW (ref 36.0–46.0)
Hemoglobin: 10.4 g/dL — ABNORMAL LOW (ref 12.0–15.0)
Immature Granulocytes: 1 %
Lymphocytes Relative: 16 %
Lymphs Abs: 1 K/uL (ref 0.7–4.0)
MCH: 33.5 pg (ref 26.0–34.0)
MCHC: 32.9 g/dL (ref 30.0–36.0)
MCV: 101.9 fL — ABNORMAL HIGH (ref 80.0–100.0)
Monocytes Absolute: 1.1 K/uL — ABNORMAL HIGH (ref 0.1–1.0)
Monocytes Relative: 17 %
Neutro Abs: 4.1 K/uL (ref 1.7–7.7)
Neutrophils Relative %: 62 %
Platelet Count: 297 K/uL (ref 150–400)
RBC: 3.1 MIL/uL — ABNORMAL LOW (ref 3.87–5.11)
RDW: 14.9 % (ref 11.5–15.5)
WBC Count: 6.4 K/uL (ref 4.0–10.5)
nRBC: 0 % (ref 0.0–0.2)

## 2019-01-16 LAB — CMP (CANCER CENTER ONLY)
ALT: 22 U/L (ref 0–44)
AST: 24 U/L (ref 15–41)
Albumin: 3.9 g/dL (ref 3.5–5.0)
Alkaline Phosphatase: 70 U/L (ref 38–126)
Anion gap: 11 (ref 5–15)
BUN: 21 mg/dL (ref 8–23)
CO2: 23 mmol/L (ref 22–32)
Calcium: 9.5 mg/dL (ref 8.9–10.3)
Chloride: 106 mmol/L (ref 98–111)
Creatinine: 1.26 mg/dL — ABNORMAL HIGH (ref 0.44–1.00)
GFR, Est AFR Am: 44 mL/min — ABNORMAL LOW (ref >60)
GFR, Estimated: 38 mL/min — ABNORMAL LOW (ref >60)
Glucose, Bld: 114 mg/dL — ABNORMAL HIGH (ref 70–99)
Potassium: 4.5 mmol/L (ref 3.5–5.1)
Sodium: 140 mmol/L (ref 135–145)
Total Bilirubin: 0.5 mg/dL (ref 0.3–1.2)
Total Protein: 7.3 g/dL (ref 6.5–8.1)

## 2019-01-16 MED ORDER — SODIUM CHLORIDE 0.9% FLUSH
10.0000 mL | INTRAVENOUS | Status: DC | PRN
  Administered 2019-01-16: 10 mL
  Filled 2019-01-16: qty 10

## 2019-01-16 MED ORDER — PROCHLORPERAZINE MALEATE 10 MG PO TABS
5.0000 mg | ORAL_TABLET | Freq: Once | ORAL | Status: AC
  Administered 2019-01-16: 5 mg via ORAL

## 2019-01-16 MED ORDER — PACLITAXEL PROTEIN-BOUND CHEMO INJECTION 100 MG
100.0000 mg/m2 | Freq: Once | INTRAVENOUS | Status: AC
  Administered 2019-01-16: 175 mg via INTRAVENOUS
  Filled 2019-01-16: qty 35

## 2019-01-16 MED ORDER — PROCHLORPERAZINE MALEATE 10 MG PO TABS
ORAL_TABLET | ORAL | Status: AC
  Filled 2019-01-16: qty 1

## 2019-01-16 MED ORDER — SODIUM CHLORIDE 0.9 % IV SOLN
800.0000 mg/m2 | Freq: Once | INTRAVENOUS | Status: AC
  Administered 2019-01-16: 1330 mg via INTRAVENOUS
  Filled 2019-01-16: qty 34.98

## 2019-01-16 MED ORDER — SODIUM CHLORIDE 0.9 % IV SOLN
Freq: Once | INTRAVENOUS | Status: AC
  Administered 2019-01-16: 11:00:00 via INTRAVENOUS
  Filled 2019-01-16: qty 250

## 2019-01-16 MED ORDER — HEPARIN SOD (PORK) LOCK FLUSH 100 UNIT/ML IV SOLN
500.0000 [IU] | Freq: Once | INTRAVENOUS | Status: AC | PRN
  Administered 2019-01-16: 500 [IU]
  Filled 2019-01-16: qty 5

## 2019-01-16 NOTE — Progress Notes (Signed)
Summer Nielsen OFFICE PROGRESS NOTE   Diagnosis: Cholangiocarcinoma  INTERVAL HISTORY:   Summer Nielsen completed another cycle of gemcitabine/Abraxane on 01/02/2019.  No fever, rash, or neuropathy symptoms.  Summer Nielsen had mild nausea seen..  Summer Nielsen had a left-sided nosebleed over the past week that lasted for 2 days.  No other bleeding.  Objective:  Vital signs in last 24 hours:  Blood pressure (!) 152/88, pulse 63, temperature 98.7 F (37.1 C), temperature source Temporal, resp. rate 17, height _0  (1.575 m), weight 136 lb 1.6 oz (61.7 kg), SpO2 94 %.   Limited physical examination secondary to distancing with the Covid pandemic HEENT: Oral cavity without thrush or ulcers, no blood or apparent ulceration of the left nostril GI: No hepatomegaly, no mass, nontender Vascular: Trace lower leg and ankle edema bilaterally   Portacath/PICC-without erythema  Lab Results:  Lab Results  Component Value Date   WBC 6.4 01/16/2019   HGB 10.4 (L) 01/16/2019   HCT 31.6 (L) 01/16/2019   MCV 101.9 (H) 01/16/2019   PLT 297 01/16/2019   NEUTROABS 4.1 01/16/2019    CMP  Lab Results  Component Value Date   NA 140 01/02/2019   K 4.1 01/02/2019   CL 108 01/02/2019   CO2 22 01/02/2019   GLUCOSE 111 (H) 01/02/2019   BUN 20 01/02/2019   CREATININE 1.17 (H) 01/02/2019   CALCIUM 8.0 (L) 01/02/2019   PROT 6.8 01/02/2019   ALBUMIN 3.5 01/02/2019   AST 20 01/02/2019   ALT 20 01/02/2019   ALKPHOS 79 01/02/2019   BILITOT 0.5 01/02/2019   GFRNONAA 41 (L) 01/02/2019   GFRAA 48 (L) 01/02/2019    Lab Results  Component Value Date   CEA1 1.96 03/21/2018     Medications: I have reviewed the patient's current medications.   Assessment/Plan: 1. Anterior liver mass, ultrasound-guided biopsy 09/22/2017-adenocarcinoma, MSI-stable, tumor mutation burden 0,IDH1 R132G, BAP1 loss ? CT chest 09/07/2017, 4.1 cm hypodense anterior liver mass, changes of chronic hypersensitivity pneumonitis ? MRI  abdomen 09/13/2017-isolated liver mass measuring 4.7 x 3.8 cm involving segments 4B and 5, no additional liver masses ? SBRT to the liver mass beginning 10/31/2017, completed 11/10/2017 ? CT abdomen/pelvis 01/23/2018-radiation changes in the liver, liver mass measuring 5.9 x 4.4 x 5.0 cm but no evidence of metastatic disease in the abdomen or pelvis ? MRI abdomen 02/22/2018-dominant central left liver lesion slightly increased in size with decreased central enhancement suggesting necrosis, 2 new lesions including an 11 mm left hepatic lesion and a 9 mm right hepatic lesion ? Xeloda 7 days on/7 days off beginning 03/27/2018 (1500 mg every morning and 1000 mg every afternoon) ? Xeloda dose reduced to 1000 mg every morning and 1000 mg every afternoon beginning 04/24/2018 due to diarrhea ? Xeloda discontinued after an office visit 05/15/2018 secondary to hand/foot syndrome and diarrhea ? CT abdomen/pelvis 06/12/2018-decrease in size of dominant left hepatic mass, no significant change in smaller liver lesions, stable biliary ductal dilatation, diffuse "colitis " ? CT 08/31/2018-decreased size of the segment 5 lesion, increased size of other liver lesions and new lesions, no evidence of extrahepatic metastatic disease, persistent colonic thickening ? Cycle 1 gemcitabine/Abraxane 09/12/2018 ? Cycle 2 gemcitabine/Abraxane 09/27/2018 ? Cycle 3 gemcitabine/Abraxane 10/10/2018 ? Cycle 4 gemcitabine/Abraxane 10/24/2018 ? Cycle 5 gemcitabine/Abraxane 11/07/2018 ? CT 11/20/2018, unchanged dominant segment 5 lesion, interval enlargement of local small hypodense lesions in the right liver, no evidence of distant metastatic disease, colonic wall thickening-resolved ? CTs reviewed by Dr. Talaysia Pinheiro/radiologist, consistent  with stable disease ? Cycle 6 gemcitabine/Abraxane 12/05/2018 ? Cycle 7 gemcitabine/Abraxane 12/19/2018 ? Cycle 8 gemcitabine/Abraxane 01/02/2019 ? Cycle 9 gemcitabine/Abraxane 01/16/2019 2. Abdominal  pain 3. History of diastolic heart failure 4. Hypertension 5. G3, P3 6. History of anemia 7. Right foot cellulitis 05/22/2018-Keflex prescribed 05/23/2018 8. C. difficile colitis 06/14/2018-treated with vancomycin, recurrent diarrhea 07/04/2018-second course of vancomycin (prolonged taper) prescribed, persistent diarrhea 08/06/2018-improved after diet change      Disposition: Summer Nielsen appears unchanged.  Summer Nielsen is tolerating the gemcitabine/Abraxane well.  I doubt the nosebleed was related to chemotherapy.  Summer Nielsen will call for recurrent bleeding.  Summer Nielsen will complete another treatment with gemcitabine/Abraxane today.  Summer Nielsen will return for an office visit and chemotherapy on 02/06/2019.  Betsy Coder, MD  01/16/2019  10:25 AM

## 2019-01-16 NOTE — Patient Instructions (Signed)
[facility] Discharge Instructions for Patients Receiving Chemotherapy  Today you received the following chemotherapy agents: Abraxane, Gemzar   To help prevent nausea and vomiting after your treatment, we encourage you to take your nausea medication as directed.    If you develop nausea and vomiting that is not controlled by your nausea medication, call the clinic.   BELOW ARE SYMPTOMS THAT SHOULD BE REPORTED IMMEDIATELY:  *FEVER GREATER THAN 100.5 F  *CHILLS WITH OR WITHOUT FEVER  NAUSEA AND VOMITING THAT IS NOT CONTROLLED WITH YOUR NAUSEA MEDICATION  *UNUSUAL SHORTNESS OF BREATH  *UNUSUAL BRUISING OR BLEEDING  TENDERNESS IN MOUTH AND THROAT WITH OR WITHOUT PRESENCE OF ULCERS  *URINARY PROBLEMS  *BOWEL PROBLEMS  UNUSUAL RASH Items with * indicate a potential emergency and should be followed up as soon as possible.  Feel free to call the clinic should you have any questions or concerns. The clinic phone number is [phone].  Please show the CHEMO ALERT CARD at check-in to the Emergency Department and triage nurse.   

## 2019-01-17 ENCOUNTER — Ambulatory Visit: Payer: Medicare Other | Admitting: Nurse Practitioner

## 2019-01-19 LAB — CANCER ANTIGEN 19-9: CA 19-9: 57 U/mL — ABNORMAL HIGH (ref 0–35)

## 2019-02-04 ENCOUNTER — Other Ambulatory Visit: Payer: Self-pay | Admitting: Oncology

## 2019-02-06 ENCOUNTER — Encounter: Payer: Self-pay | Admitting: Nurse Practitioner

## 2019-02-06 ENCOUNTER — Inpatient Hospital Stay: Payer: Medicare Other

## 2019-02-06 ENCOUNTER — Inpatient Hospital Stay: Payer: Medicare Other | Attending: Oncology | Admitting: Nurse Practitioner

## 2019-02-06 ENCOUNTER — Other Ambulatory Visit: Payer: Self-pay

## 2019-02-06 VITALS — BP 152/71 | HR 66 | Temp 98.7°F | Resp 18 | Ht 62.0 in | Wt 137.7 lb

## 2019-02-06 DIAGNOSIS — C221 Intrahepatic bile duct carcinoma: Secondary | ICD-10-CM | POA: Diagnosis not present

## 2019-02-06 DIAGNOSIS — Z95828 Presence of other vascular implants and grafts: Secondary | ICD-10-CM

## 2019-02-06 DIAGNOSIS — Z79899 Other long term (current) drug therapy: Secondary | ICD-10-CM | POA: Insufficient documentation

## 2019-02-06 DIAGNOSIS — L03115 Cellulitis of right lower limb: Secondary | ICD-10-CM | POA: Diagnosis not present

## 2019-02-06 DIAGNOSIS — Z5111 Encounter for antineoplastic chemotherapy: Secondary | ICD-10-CM | POA: Insufficient documentation

## 2019-02-06 DIAGNOSIS — I10 Essential (primary) hypertension: Secondary | ICD-10-CM | POA: Diagnosis not present

## 2019-02-06 LAB — CMP (CANCER CENTER ONLY)
ALT: 16 U/L (ref 0–44)
AST: 22 U/L (ref 15–41)
Albumin: 3.7 g/dL (ref 3.5–5.0)
Alkaline Phosphatase: 63 U/L (ref 38–126)
Anion gap: 10 (ref 5–15)
BUN: 21 mg/dL (ref 8–23)
CO2: 22 mmol/L (ref 22–32)
Calcium: 8.4 mg/dL — ABNORMAL LOW (ref 8.9–10.3)
Chloride: 107 mmol/L (ref 98–111)
Creatinine: 1.25 mg/dL — ABNORMAL HIGH (ref 0.44–1.00)
GFR, Est AFR Am: 44 mL/min — ABNORMAL LOW (ref >60)
GFR, Estimated: 38 mL/min — ABNORMAL LOW (ref >60)
Glucose, Bld: 104 mg/dL — ABNORMAL HIGH (ref 70–99)
Potassium: 4.3 mmol/L (ref 3.5–5.1)
Sodium: 139 mmol/L (ref 135–145)
Total Bilirubin: 0.5 mg/dL (ref 0.3–1.2)
Total Protein: 6.9 g/dL (ref 6.5–8.1)

## 2019-02-06 LAB — CBC WITH DIFFERENTIAL (CANCER CENTER ONLY)
Abs Immature Granulocytes: 0.04 10*3/uL (ref 0.00–0.07)
Basophils Absolute: 0 10*3/uL (ref 0.0–0.1)
Basophils Relative: 1 %
Eosinophils Absolute: 0.4 10*3/uL (ref 0.0–0.5)
Eosinophils Relative: 5 %
HCT: 31.6 % — ABNORMAL LOW (ref 36.0–46.0)
Hemoglobin: 10.2 g/dL — ABNORMAL LOW (ref 12.0–15.0)
Immature Granulocytes: 1 %
Lymphocytes Relative: 12 %
Lymphs Abs: 1 10*3/uL (ref 0.7–4.0)
MCH: 33.9 pg (ref 26.0–34.0)
MCHC: 32.3 g/dL (ref 30.0–36.0)
MCV: 105 fL — ABNORMAL HIGH (ref 80.0–100.0)
Monocytes Absolute: 1.1 10*3/uL — ABNORMAL HIGH (ref 0.1–1.0)
Monocytes Relative: 13 %
Neutro Abs: 5.9 10*3/uL (ref 1.7–7.7)
Neutrophils Relative %: 68 %
Platelet Count: 324 10*3/uL (ref 150–400)
RBC: 3.01 MIL/uL — ABNORMAL LOW (ref 3.87–5.11)
RDW: 15.8 % — ABNORMAL HIGH (ref 11.5–15.5)
WBC Count: 8.5 10*3/uL (ref 4.0–10.5)
nRBC: 0 % (ref 0.0–0.2)

## 2019-02-06 MED ORDER — PACLITAXEL PROTEIN-BOUND CHEMO INJECTION 100 MG
100.0000 mg/m2 | Freq: Once | INTRAVENOUS | Status: AC
  Administered 2019-02-06: 175 mg via INTRAVENOUS
  Filled 2019-02-06: qty 35

## 2019-02-06 MED ORDER — PROCHLORPERAZINE MALEATE 10 MG PO TABS
ORAL_TABLET | ORAL | Status: AC
  Filled 2019-02-06: qty 1

## 2019-02-06 MED ORDER — SODIUM CHLORIDE 0.9 % IV SOLN
800.0000 mg/m2 | Freq: Once | INTRAVENOUS | Status: AC
  Administered 2019-02-06: 1330 mg via INTRAVENOUS
  Filled 2019-02-06: qty 34.98

## 2019-02-06 MED ORDER — PROCHLORPERAZINE MALEATE 10 MG PO TABS
5.0000 mg | ORAL_TABLET | Freq: Once | ORAL | Status: AC
  Administered 2019-02-06: 5 mg via ORAL

## 2019-02-06 MED ORDER — SODIUM CHLORIDE 0.9% FLUSH
10.0000 mL | INTRAVENOUS | Status: DC | PRN
  Administered 2019-02-06: 10 mL
  Filled 2019-02-06: qty 10

## 2019-02-06 MED ORDER — SODIUM CHLORIDE 0.9 % IV SOLN
Freq: Once | INTRAVENOUS | Status: AC
  Administered 2019-02-06: 11:00:00 via INTRAVENOUS
  Filled 2019-02-06: qty 250

## 2019-02-06 MED ORDER — HEPARIN SOD (PORK) LOCK FLUSH 100 UNIT/ML IV SOLN
500.0000 [IU] | Freq: Once | INTRAVENOUS | Status: AC | PRN
  Administered 2019-02-06: 500 [IU]
  Filled 2019-02-06: qty 5

## 2019-02-06 NOTE — Patient Instructions (Signed)
[facility] Discharge Instructions for Patients Receiving Chemotherapy  Today you received the following chemotherapy agents: Abraxane, Gemzar   To help prevent nausea and vomiting after your treatment, we encourage you to take your nausea medication as directed.    If you develop nausea and vomiting that is not controlled by your nausea medication, call the clinic.   BELOW ARE SYMPTOMS THAT SHOULD BE REPORTED IMMEDIATELY:  *FEVER GREATER THAN 100.5 F  *CHILLS WITH OR WITHOUT FEVER  NAUSEA AND VOMITING THAT IS NOT CONTROLLED WITH YOUR NAUSEA MEDICATION  *UNUSUAL SHORTNESS OF BREATH  *UNUSUAL BRUISING OR BLEEDING  TENDERNESS IN MOUTH AND THROAT WITH OR WITHOUT PRESENCE OF ULCERS  *URINARY PROBLEMS  *BOWEL PROBLEMS  UNUSUAL RASH Items with * indicate a potential emergency and should be followed up as soon as possible.  Feel free to call the clinic should you have any questions or concerns. The clinic phone number is [phone].  Please show the [facility] at check-in to the Emergency Department and triage nurse.

## 2019-02-06 NOTE — Progress Notes (Signed)
[facility] OFFICE PROGRESS NOTE   Diagnosis: Cholangiocarcinoma  INTERVAL HISTORY:   Summer Nielsen returns as scheduled.  She completed cycle 9 gemcitabine/Abraxane 01/16/2019.  Main complaint is progressive fatigue.  No nausea or vomiting.  No mouth sores.  No diarrhea.  She denies neuropathy symptoms.  No fever or rash.  She has occasional pain left lateral abdomen/flank.  She states the pain is "not bad and not every day".  Objective:  Vital signs in last 24 hours:  Blood pressure (!) 152/71, pulse 66, temperature 98.7 F (37.1 C), temperature source Temporal, resp. rate 18, height _0  (1.575 m), weight 137 lb 11.2 oz (62.5 kg), SpO2 91 %.    HEENT: No thrush or ulcers. GI: Abdomen soft and nontender.  No hepatomegaly.  No mass. Vascular: Trace edema at the lower legs bilaterally. Neuro: Alert and oriented. Skin: No rash. Port-A-Cath without erythema.   Lab Results:  Lab Results  Component Value Date   WBC 8.5 02/06/2019   HGB 10.2 (L) 02/06/2019   HCT 31.6 (L) 02/06/2019   MCV 105.0 (H) 02/06/2019   PLT 324 02/06/2019   NEUTROABS 5.9 02/06/2019    Imaging:  No results found.  Medications: I have reviewed the patient's current medications.  Assessment/Plan: 1. Anterior liver mass, ultrasound-guided biopsy 09/22/2017-adenocarcinoma, MSI-stable, tumor mutation burden 0,IDH1 R132G, BAP1 loss ? CT chest 09/07/2017, 4.1 cm hypodense anterior liver mass, changes of chronic hypersensitivity pneumonitis ? MRI abdomen 09/13/2017-isolated liver mass measuring 4.7 x 3.8 cm involving segments 4B and 5, no additional liver masses ? SBRT to the liver mass beginning 10/31/2017, completed 11/10/2017 ? CT abdomen/pelvis 01/23/2018-radiation changes in the liver, liver mass measuring 5.9 x 4.4 x 5.0 cm but no evidence of metastatic disease in the abdomen or pelvis ? MRI abdomen 02/22/2018-dominant central left liver lesion slightly increased in size with decreased  central enhancement suggesting necrosis, 2 new lesions including an 11 mm left hepatic lesion and a 9 mm right hepatic lesion ? Xeloda 7 days on/7 days off beginning 03/27/2018 (1500 mg every morning and 1000 mg every afternoon) ? Xeloda dose reduced to 1000 mg every morning and 1000 mg every afternoon beginning 04/24/2018 due to diarrhea ? Xeloda discontinued after an office visit 05/15/2018 secondary to hand/foot syndrome and diarrhea ? CT abdomen/pelvis 06/12/2018-decrease in size of dominant left hepatic mass, no significant change in smaller liver lesions, stable biliary ductal dilatation, diffuse "colitis " ? CT 08/31/2018-decreased size of the segment 5 lesion, increased size of other liver lesions and new lesions, no evidence of extrahepatic metastatic disease, persistent colonic thickening ? Cycle 1 gemcitabine/Abraxane 09/12/2018 ? Cycle 2 gemcitabine/Abraxane 09/27/2018 ? Cycle 3 gemcitabine/Abraxane 10/10/2018 ? Cycle 4 gemcitabine/Abraxane 10/24/2018 ? Cycle 5 gemcitabine/Abraxane 11/07/2018 ? CT 11/20/2018, unchanged dominant segment 5 lesion, interval enlargement of local small hypodense lesions in the right liver, no evidence of distant metastatic disease, colonic wall thickening-resolved ? CTs reviewed by Dr. Sherrill/radiologist, consistent with stable disease ? Cycle 6 gemcitabine/Abraxane 12/05/2018 ? Cycle 7 gemcitabine/Abraxane 12/19/2018 ? Cycle 8 gemcitabine/Abraxane 01/02/2019 ? Cycle 9 gemcitabine/Abraxane 01/16/2019 ? Cycle 10 gemcitabine/Abraxane 02/06/2019 2. Abdominal pain 3. History of diastolic heart failure 4. Hypertension 5. G3, P3 6. History of anemia 7. Right foot cellulitis 05/22/2018-Keflex prescribed 05/23/2018 8. C. difficile colitis 06/14/2018-treated with vancomycin, recurrent diarrhea 07/04/2018-second course of vancomycin (prolonged taper) prescribed, persistent diarrhea 08/06/2018-improved after diet change    Disposition: Summer Nielsen appears stable.  She has  completed 9 cycles of gemcitabine/Abraxane.  Plan to  proceed with cycle 10 today as scheduled.  She will undergo restaging CTs prior to her next visit.  We reviewed the CBC from today.  Counts adequate to proceed with treatment.  She will return for lab, follow-up, gemcitabine/Abraxane in 2 weeks.  She will contact the office in the interim with any problems.    Ned Card ANP/GNP-BC   02/06/2019  10:35 AM

## 2019-02-07 ENCOUNTER — Telehealth: Payer: Self-pay | Admitting: Oncology

## 2019-02-07 LAB — CANCER ANTIGEN 19-9: CA 19-9: 58 U/mL — ABNORMAL HIGH (ref 0–35)

## 2019-02-07 NOTE — Telephone Encounter (Signed)
Scheduled per los. Called and left msg.will mail printout

## 2019-02-08 NOTE — Progress Notes (Addendum)
CARDIOLOGY OFFICE NOTE  Date:  02/14/2019    [name] Date of Birth: 1927/07/04 Medical Record #[id number]  PCP:  Maurice Small, MD  Cardiologist:  Marisa Cyphers    Chief Complaint  Patient presents with   Follow-up    History of Present Illness: Summer Nielsen is a 85 y.o. female who presents today for a 4 month check.  Seen for Dr. Marlou Porch.   She has a hx ofacute diastolic heart failure with elevated BNP, grade 2 diastolic dysfunction on echocardiogram,and prior pulmonary infiltrate.  In July 2017, she began having more shortness of breath and was diagnosed originally with pneumonia. No fever, no chills. Treated with antibiotic course. She never improved that much. Dr. Justin Mend checked an echocardiogram which showed grade 2 diastolic dysfunction and BNP which was 200. She placed her on Lasix 40 mg once a day. Her lower extremity edema improved. Her breathing improved to a certain degree. She has had some bradycardia with Inderal noted. There has been concern for potential pulmonary fibrosis. She has had a liver mass noted last year in 2019 - found to have cancer - treated with XRT and chemotherapy.   I saw her back in August - felt like she was doing "fair". Was on IV chemo. Still pretty short of breath. Still able to do most of her ADLS and also taking care of her 56 year old husband.   The patient does not have symptoms concerning for COVID-19 infection (fever, chills, cough, or new shortness of breath).   Comes in today. Here alone. She seems more deconditioned. Still trying to take care of her 20 year old husband. Not getting out as much. More shortness of breath. Little cough. She has scans later this week with oncology follow up. Still with some edema. Her echo looked pretty good. She is still on chemo - getting Gemzar and Abraxane. No hemoptysis. No chest pain.   Past Medical History:  Diagnosis Date   Cancer ([facility])    squamous cell skin cancer on left  leg-removed   CHF (congestive heart failure) (H[facility])    Cholangiocarcinoma ([place]) dx'd 04/4095   Complication of anesthesia    GERD (gastroesophageal reflux disease)    Glaucoma    High blood pressure    Hyperlipidemia    Hypothyroidism    OP (osteoporosis)    PONV (postoperative nausea and vomiting)    Postsurgical hypothyroidism    Spondylosis of cervical region without myelopathy or radiculopathy    Tachycardia    TIA (transient ischemic attack)     Past Surgical History:  Procedure Laterality Date   ABDOMINAL HYSTERECTOMY     BACK SURGERY N/A 03/2017   Lumbar   CHOLECYSTECTOMY     EYE SURGERY     removed cateracts on both eyes   IR IMAGING GUIDED PORT INSERTION  09/06/2018   JOINT REPLACEMENT     3 hip replacement on left, right knee replacement   THYROIDECTOMY       Medications: Current Meds  Medication Sig   acetaminophen (TYLENOL) 500 MG tablet Take 1,000 mg by mouth daily as needed for moderate pain or headache.   amLODipine (NORVASC) 5 MG tablet Take 5 mg by mouth daily.   atorvastatin (LIPITOR) 20 MG tablet Take 20 mg by mouth daily.   bimatoprost (LUMIGAN) 0.01 % SOLN Place 1 drop into both eyes at bedtime.    calcium-vitamin D (OSCAL WITH D) 500-200 MG-UNIT tablet Take 1 tablet by mouth daily.  diphenoxylate-atropine (LOMOTIL) 2.5-0.025 MG tablet Take 2 tablets by mouth 4 (four) times daily as needed for diarrhea or loose stools.   furosemide (LASIX) 40 MG tablet Take 40 mg by mouth 2 (two) times daily.   gabapentin (NEURONTIN) 300 MG capsule TAKE 1 CAPSULE BY MOUTH THREE TIMES A DAY   HYDROmorphone (DILAUDID) 2 MG tablet Take 0.5-1 tablets (1-2 mg total) by mouth every 4 (four) hours as needed for severe pain.   ibuprofen (ADVIL,MOTRIN) 200 MG tablet Take 200 mg by mouth every 6 (six) hours as needed for moderate pain (back surgery).   Iron-FA-B Cmp-C-Biot-Probiotic (FUSION PLUS) CAPS Take 1 capsule by mouth daily.   KLOR-CON M10  10 MEQ tablet TAKE 2 TABLETS EVERY DAY   levothyroxine (SYNTHROID, LEVOTHROID) 150 MCG tablet Take 150 mcg by mouth daily.   lidocaine-prilocaine (EMLA) cream Apply 1 application topically as directed.   loperamide (IMODIUM) 2 MG capsule Take 2 capsules by mouth 4 (four) times daily as needed.   losartan (COZAAR) 50 MG tablet Take 50 mg by mouth daily.   omeprazole (PRILOSEC) 40 MG capsule TAKE 1 CAPSULE 30 MINUTES BEFORE MORNING MEAL   prochlorperazine (COMPAZINE) 5 MG tablet Take 1-2 tablets (5-10 mg total) by mouth every 6 (six) hours as needed for nausea or vomiting.   propranolol (INDERAL) 20 MG tablet Take 20 mg by mouth 2 (two) times daily.      Allergies: Allergies  Allergen Reactions   Levofloxacin Nausea Only    Dizzy, hot, foot pain, insomnia   Percocet [Oxycodone-Acetaminophen] Nausea And Vomiting and Other (See Comments)    sick    Social History: The patient  reports that she has never smoked. She has never used smokeless tobacco. She reports that she does not drink alcohol or use drugs.   Family History: The patient's family history includes Cerebral aneurysm in her mother; Heart attack in her father and mother; Heart disease in her brother and sister; Hypertension in her mother; Stroke in her mother.   Review of Systems: Please see the history of present illness.   All other systems are reviewed and negative.   Physical Exam: VS:  BP 124/70    Pulse 82    Ht _0  (1.575 m)    Wt 140 lb 12 oz (63.8 kg)    LMP  (LMP Unknown)    SpO2 95%    BMI 25.74 kg/m  .  BMI Body mass index is 25.74 kg/m.  Wt Readings from Last 3 Encounters:  02/14/19 140 lb 12 oz (63.8 kg)  02/06/19 137 lb 11.2 oz (62.5 kg)  01/16/19 136 lb 1.6 oz (61.7 kg)    General: Pleasant. She looks more frail. She is short of breath with walking and even with long conversation. She is in no acute distress.   HEENT: Normal.  Neck: Supple, no JVD, carotid bruits, or masses noted.  Cardiac:  Regular rate and rhythm. No murmurs, rubs, or gallops. +1 edema bilateral.  Respiratory:  Lungs are very coarse/crackly with increased work of breathing.  GI: Soft and nontender.  MS: No deformity or atrophy. Gait and ROM intact.  Skin: Warm and dry. Color is normal.  Neuro:  Strength and sensation are intact and no gross focal deficits noted.  Psych: Alert, appropriate and with normal affect.   Her resting oxygen sat on RA is 95%.  On room air with exertion her oxygen sat is 81% On 2 lpm of oxygen with exertion, her oxygen sat is 100%.  LABORATORY DATA:  EKG:  EKG is not ordered today.   Lab Results  Component Value Date   WBC 8.5 02/06/2019   HGB 10.2 (L) 02/06/2019   HCT 31.6 (L) 02/06/2019   PLT 324 02/06/2019   GLUCOSE 104 (H) 02/06/2019   CHOL 164 03/23/2018   TRIG 169 (H) 03/23/2018   HDL 45 03/23/2018   LDLCALC 85 03/23/2018   ALT 16 02/06/2019   AST 22 02/06/2019   NA 139 02/06/2019   K 4.3 02/06/2019   CL 107 02/06/2019   CREATININE 1.25 (H) 02/06/2019   BUN 21 02/06/2019   CO2 22 02/06/2019   TSH 9.900 (H) 08/08/2017   INR 1.1 09/06/2018       BNP (last 3 results) No results for input(s): BNP in the last 8760 hours.  ProBNP (last 3 results) No results for input(s): PROBNP in the last 8760 hours.   Other Studies Reviewed Today:  ECHO IMPRESSIONS 10/2018    1. The left ventricle has normal systolic function with an ejection fraction of 60-65%. The cavity size was normal. Left ventricular diastolic parameters were normal.  2. Normal GLS -18.7.  3. The right ventricle has normal systolic function. The cavity was normal. There is no increase in right ventricular wall thickness.  4. Left atrial size was mildly dilated.  5. Mild thickening of the mitral valve leaflet. Mild calcification of the mitral valve leaflet. There is mild mitral annular calcification present.  6. The aortic valve is tricuspid. Mild thickening of the aortic valve. Mild  calcification of the aortic valve. Aortic valve regurgitation is trivial by color flow Doppler.  7. The aorta is normal unless otherwise noted.  8. Small calcified mobile structure likely represents chiarri network.   Myoview Study Highlights 06/2016    Nuclear stress EF: 71%.  No T wave inversion was noted during stress.  There was no ST segment deviation noted during stress.  This is a low risk study.  No reversible ischemia. LVEF 71% with normal wall motion. This is a low risk study.    ECHO Study Conclusions 05/2016  - Left ventricle: The cavity size was normal. Systolic function was normal. The estimated ejection fraction was in the range of 55% to 60%. Wall motion was normal; there were no regional wall motion abnormalities. There was a reduced contribution of atrial contraction to ventricular filling, due to increased ventricular diastolic pressure or atrial contractile dysfunction. Features are consistent with a pseudonormal left ventricular filling pattern, with concomitant abnormal relaxation and increased filling pressure (grade 2 diastolic dysfunction). - Aortic valve: Trileaflet; mildly thickened, mildly calcified leaflets. There was trivial regurgitation. - Mitral valve: Calcified annulus. Valve area by pressure half-time: 1.45 cm^2. - Left atrium: The atrium was moderately dilated. - Pulmonary arteries: PA peak pressure: 35 mm Hg (S).    ASSESSMENT AND PLAN:  1.Progressive DOE - her exam seems to be more consistent with fibrosis - she has had pneumonitis. She has been to pulmonary in the past and did not wish to return there. She was quite hypoxic here in the office - resting oxygen sat was 96%. With ambulation less than 200 feet - she dropped to 81% and was markedly and visibly SOB. Oxygen was placed at 2 liters with improvement in her oxygen saturations back to 100%. She felt dramatic improvement. I have asked her to see her  PCP. We are arranging oxygen at 2 liter per [place] thru [employer] - have talked with Magda Paganini [phone]). This is going  to be delivered today.   2. Chronic diastolic HF - little more edema on exam - stopping Norvasc today. Increasing her Lasix.   3. HTN - BP is fine - stopping Norvasc today.   4. HLD - has liver cancer - would stop her statin therapy   5. Liver cancer - followed by Oncology - still on chemo - looked to have progression by her last CT that I see - she is having repeat scans Friday with follow up. She may need CT of the chest as well.   6. Chronic resting bradycardia - not noted today.   7. COVID-19 Education: The signs and symptoms of COVID-19 were discussed with the patient and how to seek care for testing (follow up with PCP or arrange E-visit).  The importance of social distancing, staying at home, hand hygiene and wearing a mask when out in public were discussed today.  Current medicines are reviewed with the patient today.  The patient does not have concerns regarding medicines other than what has been noted above.  The following changes have been made:  See above.  Labs/ tests ordered today include:   No orders of the defined types were placed in this encounter.    Disposition:   FU with me in about 3 months. I have asked her to see PCP - she did not wish to ever go back and see pulmonary. Will defer CT of the chest to Dr. Benay Spice.      Patient is agreeable to this plan and will call if any problems develop in the interim.   SignedTruitt Merle, NP  02/14/2019 3:13 PM  [facility] [address] [place], [place]  [address] Phone: [phone] Fax: [phone]

## 2019-02-14 ENCOUNTER — Other Ambulatory Visit: Payer: Self-pay

## 2019-02-14 ENCOUNTER — Encounter: Payer: Self-pay | Admitting: Nurse Practitioner

## 2019-02-14 ENCOUNTER — Ambulatory Visit: Payer: Medicare Other | Admitting: Nurse Practitioner

## 2019-02-14 VITALS — BP 124/70 | HR 82 | Ht 62.0 in | Wt 140.8 lb

## 2019-02-14 DIAGNOSIS — E785 Hyperlipidemia, unspecified: Secondary | ICD-10-CM

## 2019-02-14 DIAGNOSIS — R0602 Shortness of breath: Secondary | ICD-10-CM | POA: Diagnosis not present

## 2019-02-14 DIAGNOSIS — I10 Essential (primary) hypertension: Secondary | ICD-10-CM

## 2019-02-14 DIAGNOSIS — R0902 Hypoxemia: Secondary | ICD-10-CM | POA: Diagnosis not present

## 2019-02-14 DIAGNOSIS — I5032 Chronic diastolic (congestive) heart failure: Secondary | ICD-10-CM

## 2019-02-14 NOTE — Addendum Note (Signed)
Addended by: Burtis Junes on: 02/14/2019 03:18 PM   Modules accepted: Orders

## 2019-02-14 NOTE — Addendum Note (Signed)
Addended by: Burtis Junes on: 02/14/2019 04:14 PM   Modules accepted: Orders

## 2019-02-14 NOTE — Patient Instructions (Addendum)
After Visit Summary:  We will be checking the following labs today - NONE   Medication Instructions:    Continue with your current medicines. BUT  I am stopping the Lipitor and the Norvasc today.   I am increasing the Lasix to 60 mg in the Am and 40 mg after lunch.   If you need a refill on your cardiac medications before your next appointment, please call your pharmacy.     Testing/Procedures To Be Arranged:  N/A  Follow-Up:   See me in about 3 months    At [facility], you and your health needs are our priority.  As part of our continuing mission to provide you with exceptional heart care, we have created designated Provider Care Teams.  These Care Teams include your primary Cardiologist (physician) and Advanced Practice Providers (APPs -  Physician Assistants and Nurse Practitioners) who all work together to provide you with the care you need, when you need it.  Special Instructions:  . Stay safe, stay home, wash your hands for at least 20 seconds and wear a mask when out in public.  . It was good to talk with you today.  . I will send a note to Dr. Benay Spice about possible CT scan of the chest . We are placing an order to have Home Health get you oxygen  . I want you to see your PCP in the next few weeks    Call the [place] office at [phone] if you have any questions, problems or concerns.

## 2019-02-14 NOTE — Progress Notes (Signed)
Thanks We will order imaging

## 2019-02-15 ENCOUNTER — Other Ambulatory Visit: Payer: Self-pay | Admitting: *Deleted

## 2019-02-15 DIAGNOSIS — C221 Intrahepatic bile duct carcinoma: Secondary | ICD-10-CM

## 2019-02-15 NOTE — Progress Notes (Signed)
Added CT chest to CT abdomen tomorrow per Dr. Benay Spice. Central scheduling added procedure. Notified managed care to f/u on PA.

## 2019-02-16 ENCOUNTER — Other Ambulatory Visit: Payer: Self-pay

## 2019-02-16 ENCOUNTER — Ambulatory Visit (HOSPITAL_COMMUNITY)
Admission: RE | Admit: 2019-02-16 | Discharge: 2019-02-16 | Disposition: A | Discharge status: Home / Self Care | Payer: Medicare Other | Source: Ambulatory Visit | Attending: Nurse Practitioner | Admitting: Nurse Practitioner

## 2019-02-16 DIAGNOSIS — C221 Intrahepatic bile duct carcinoma: Secondary | ICD-10-CM

## 2019-02-16 MED ORDER — SODIUM CHLORIDE (PF) 0.9 % IJ SOLN
INTRAMUSCULAR | Status: AC
  Filled 2019-02-16: qty 50

## 2019-02-16 MED ORDER — HEPARIN SOD (PORK) LOCK FLUSH 100 UNIT/ML IV SOLN
INTRAVENOUS | Status: AC
  Filled 2019-02-16: qty 5

## 2019-02-16 MED ORDER — IOHEXOL 300 MG/ML  SOLN
75.0000 mL | Freq: Once | INTRAMUSCULAR | Status: AC | PRN
  Administered 2019-02-16: 60 mL via INTRAVENOUS

## 2019-02-18 ENCOUNTER — Other Ambulatory Visit: Payer: Self-pay | Admitting: Oncology

## 2019-02-19 ENCOUNTER — Telehealth: Payer: Self-pay | Admitting: *Deleted

## 2019-02-19 ENCOUNTER — Other Ambulatory Visit: Payer: Self-pay | Admitting: *Deleted

## 2019-02-19 DIAGNOSIS — C221 Intrahepatic bile duct carcinoma: Secondary | ICD-10-CM

## 2019-02-19 NOTE — Telephone Encounter (Signed)
Noted.   Cecille Rubin

## 2019-02-19 NOTE — Telephone Encounter (Signed)
S/w pt did receive O2 the same night as ov.  Pt stated did not have to wear 02 continuous. Will send to [place] to [place].

## 2019-02-20 ENCOUNTER — Inpatient Hospital Stay: Payer: Medicare Other

## 2019-02-20 ENCOUNTER — Encounter: Payer: Self-pay | Admitting: Nurse Practitioner

## 2019-02-20 ENCOUNTER — Other Ambulatory Visit: Payer: Self-pay

## 2019-02-20 ENCOUNTER — Inpatient Hospital Stay (HOSPITAL_BASED_OUTPATIENT_CLINIC_OR_DEPARTMENT_OTHER): Payer: Medicare Other | Admitting: Nurse Practitioner

## 2019-02-20 VITALS — BP 156/65 | HR 62 | Temp 97.9°F | Resp 16 | Ht 62.0 in | Wt 137.6 lb

## 2019-02-20 DIAGNOSIS — Z95828 Presence of other vascular implants and grafts: Secondary | ICD-10-CM

## 2019-02-20 DIAGNOSIS — C221 Intrahepatic bile duct carcinoma: Secondary | ICD-10-CM

## 2019-02-20 DIAGNOSIS — Z5111 Encounter for antineoplastic chemotherapy: Secondary | ICD-10-CM | POA: Diagnosis not present

## 2019-02-20 LAB — CBC WITH DIFFERENTIAL (CANCER CENTER ONLY)
Abs Immature Granulocytes: 0.04 10*3/uL (ref 0.00–0.07)
Basophils Absolute: 0.1 10*3/uL (ref 0.0–0.1)
Basophils Relative: 1 %
Eosinophils Absolute: 0.6 10*3/uL — ABNORMAL HIGH (ref 0.0–0.5)
Eosinophils Relative: 9 %
HCT: 27.4 % — ABNORMAL LOW (ref 36.0–46.0)
Hemoglobin: 8.8 g/dL — ABNORMAL LOW (ref 12.0–15.0)
Immature Granulocytes: 1 %
Lymphocytes Relative: 13 %
Lymphs Abs: 0.9 10*3/uL (ref 0.7–4.0)
MCH: 33.2 pg (ref 26.0–34.0)
MCHC: 32.1 g/dL (ref 30.0–36.0)
MCV: 103.4 fL — ABNORMAL HIGH (ref 80.0–100.0)
Monocytes Absolute: 1.2 10*3/uL — ABNORMAL HIGH (ref 0.1–1.0)
Monocytes Relative: 17 %
Neutro Abs: 4.2 10*3/uL (ref 1.7–7.7)
Neutrophils Relative %: 59 %
Platelet Count: 335 10*3/uL (ref 150–400)
RBC: 2.65 MIL/uL — ABNORMAL LOW (ref 3.87–5.11)
RDW: 15.8 % — ABNORMAL HIGH (ref 11.5–15.5)
WBC Count: 7.1 10*3/uL (ref 4.0–10.5)
nRBC: 0 % (ref 0.0–0.2)

## 2019-02-20 LAB — CMP (CANCER CENTER ONLY)
ALT: 14 U/L (ref 0–44)
AST: 19 U/L (ref 15–41)
Albumin: 3.2 g/dL — ABNORMAL LOW (ref 3.5–5.0)
Alkaline Phosphatase: 68 U/L (ref 38–126)
Anion gap: 11 (ref 5–15)
BUN: 22 mg/dL (ref 8–23)
CO2: 22 mmol/L (ref 22–32)
Calcium: 7.5 mg/dL — ABNORMAL LOW (ref 8.9–10.3)
Chloride: 107 mmol/L (ref 98–111)
Creatinine: 1.24 mg/dL — ABNORMAL HIGH (ref 0.44–1.00)
GFR, Est AFR Am: 45 mL/min — ABNORMAL LOW (ref >60)
GFR, Estimated: 38 mL/min — ABNORMAL LOW (ref >60)
Glucose, Bld: 122 mg/dL — ABNORMAL HIGH (ref 70–99)
Potassium: 4.1 mmol/L (ref 3.5–5.1)
Sodium: 140 mmol/L (ref 135–145)
Total Bilirubin: 0.4 mg/dL (ref 0.3–1.2)
Total Protein: 6.5 g/dL (ref 6.5–8.1)

## 2019-02-20 MED ORDER — HEPARIN SOD (PORK) LOCK FLUSH 100 UNIT/ML IV SOLN
500.0000 [IU] | Freq: Once | INTRAVENOUS | Status: AC | PRN
  Administered 2019-02-20: 500 [IU]
  Filled 2019-02-20: qty 5

## 2019-02-20 MED ORDER — SODIUM CHLORIDE 0.9 % IV SOLN
Freq: Once | INTRAVENOUS | Status: AC
  Filled 2019-02-20: qty 250

## 2019-02-20 MED ORDER — SODIUM CHLORIDE 0.9% FLUSH
10.0000 mL | INTRAVENOUS | Status: DC | PRN
  Administered 2019-02-20: 10 mL
  Filled 2019-02-20: qty 10

## 2019-02-20 MED ORDER — PACLITAXEL PROTEIN-BOUND CHEMO INJECTION 100 MG
100.0000 mg/m2 | Freq: Once | INTRAVENOUS | Status: AC
  Administered 2019-02-20: 175 mg via INTRAVENOUS
  Filled 2019-02-20: qty 35

## 2019-02-20 MED ORDER — SODIUM CHLORIDE 0.9 % IV SOLN
800.0000 mg/m2 | Freq: Once | INTRAVENOUS | Status: AC
  Administered 2019-02-20: 1330 mg via INTRAVENOUS
  Filled 2019-02-20: qty 34.98

## 2019-02-20 MED ORDER — PROCHLORPERAZINE MALEATE 10 MG PO TABS
5.0000 mg | ORAL_TABLET | Freq: Once | ORAL | Status: AC
  Administered 2019-02-20: 5 mg via ORAL

## 2019-02-20 MED ORDER — PROCHLORPERAZINE MALEATE 10 MG PO TABS
ORAL_TABLET | ORAL | Status: AC
  Filled 2019-02-20: qty 1

## 2019-02-20 NOTE — Patient Instructions (Signed)

## 2019-02-20 NOTE — Progress Notes (Addendum)
Summer Nielsen OFFICE PROGRESS NOTE   Diagnosis: Cholangiocarcinoma  INTERVAL HISTORY:   Summer Nielsen returns as scheduled.  She completed cycle 10 gemcitabine/Abraxane 02/06/2019.  She denies nausea/vomiting.  No mouth sores.  No diarrhea.  No skin rash following treatment.  No fever after treatment.  She reports continued dyspnea on exertion.  She has noted improvement since utilizing supplemental oxygen.  No cough.  No bleeding.  No neuropathy symptoms.  Objective:  Vital signs in last 24 hours:  Blood pressure (!) 156/65, pulse 62, temperature 97.9 F (36.6 C), temperature source Temporal, resp. rate 16, height _0  (1.575 m), weight 137 lb 9.6 oz (62.4 kg), SpO2 96 %.    HEENT: No thrush or ulcers. Resp: Rales at both lower lung fields.  No respiratory distress. GI: Abdomen soft and nontender.  No hepatomegaly.  No mass. Vascular: Trace edema at the lower legs bilaterally. Neuro: Alert and oriented. Skin: No rash. Port-A-Cath without erythema.   Lab Results:  Lab Results  Component Value Date   WBC 7.1 02/20/2019   HGB 8.8 (L) 02/20/2019   HCT 27.4 (L) 02/20/2019   MCV 103.4 (H) 02/20/2019   PLT 335 02/20/2019   NEUTROABS 4.2 02/20/2019    Imaging:  No results found.  Medications: I have reviewed the patient's current medications.  Assessment/Plan: 1. Anterior liver mass, ultrasound-guided biopsy 09/22/2017-adenocarcinoma, MSI-stable, tumor mutation burden 0,IDH1 R132G, BAP1 loss ? CT chest 09/07/2017, 4.1 cm hypodense anterior liver mass, changes of chronic hypersensitivity pneumonitis ? MRI abdomen 09/13/2017-isolated liver mass measuring 4.7 x 3.8 cm involving segments 4B and 5, no additional liver masses ? SBRT to the liver mass beginning 10/31/2017, completed 11/10/2017 ? CT abdomen/pelvis 01/23/2018-radiation changes in the liver, liver mass measuring 5.9 x 4.4 x 5.0 cm but no evidence of metastatic disease in the abdomen or pelvis ? MRI abdomen  02/22/2018-dominant central left liver lesion slightly increased in size with decreased central enhancement suggesting necrosis, 2 new lesions including an 11 mm left hepatic lesion and a 9 mm right hepatic lesion ? Xeloda 7 days on/7 days off beginning 03/27/2018 (1500 mg every morning and 1000 mg every afternoon) ? Xeloda dose reduced to 1000 mg every morning and 1000 mg every afternoon beginning 04/24/2018 due to diarrhea ? Xeloda discontinued after an office visit 05/15/2018 secondary to hand/foot syndrome and diarrhea ? CT abdomen/pelvis 06/12/2018-decrease in size of dominant left hepatic mass, no significant change in smaller liver lesions, stable biliary ductal dilatation, diffuse "colitis " ? CT 08/31/2018-decreased size of the segment 5 lesion, increased size of other liver lesions and new lesions, no evidence of extrahepatic metastatic disease, persistent colonic thickening ? Cycle 1 gemcitabine/Abraxane 09/12/2018 ? Cycle 2 gemcitabine/Abraxane 09/27/2018 ? Cycle 3 gemcitabine/Abraxane 10/10/2018 ? Cycle 4 gemcitabine/Abraxane 10/24/2018 ? Cycle 5 gemcitabine/Abraxane 11/07/2018 ? CT 11/20/2018, unchanged dominant segment 5 lesion, interval enlargement of local small hypodense lesions in the right liver, no evidence of distant metastatic disease, colonic wall thickening-resolved ? CTs reviewed by Dr. Sherrill/radiologist, consistent with stable disease ? Cycle 6 gemcitabine/Abraxane 12/05/2018 ? Cycle 7 gemcitabine/Abraxane 12/19/2018 ? Cycle 8 gemcitabine/Abraxane 01/02/2019 ? Cycle 9 gemcitabine/Abraxane 01/16/2019 ? Cycle 10 gemcitabine/Abraxane 02/06/2019  ? CTs 02/16/2019-stable liver lesions.  Chronic changes of interstitial lung disease, appears mildly progressive.  New trace bilateral pleural effusions and/or areas of pleural thickening. ? Cycle 11 gemcitabine/Abraxane 02/20/2019 (schedule changed to every 3 weeks going forward) 2. Abdominal pain 3. History of diastolic heart  failure 4. Hypertension 5. G3, P3 6.  History of anemia 7. Right foot cellulitis 05/22/2018-Keflex prescribed 05/23/2018 8. C. difficile colitis 06/14/2018-treated with vancomycin, recurrent diarrhea 07/04/2018-second course of vancomycin (prolonged taper) prescribed, persistent diarrhea 08/06/2018-improved after diet change    Disposition: Summer Nielsen appears unchanged.  She has completed 10 cycles of gemcitabine/Abraxane.  Restaging CTs show stable to mildly improved liver lesions.  We discussed treatment options to include a treatment break versus continuation of the chemotherapy on an every 3-week schedule.  She would like to continue treatment on a 3-week schedule.  Plan to proceed with cycle 11 today as scheduled.  She has chronic lung changes on the CT which predated the start of gemcitabine/Abraxane.  She will continue supplemental oxygen as needed.  We reviewed the labs from today, adequate to proceed with treatment.  Hemoglobin is lower, likely due to chemotherapy.  Stable mild elevation of creatinine.  She will return for lab, follow-up, gemcitabine/Abraxane in 3 weeks.  She will contact the office in the interim with any problems.  Patient seen with Dr. Benay Spice.  CT images reviewed from 02/16/2019 as well as multiple prior CTs.  25 minutes were spent face-to-face at today's visit with the majority of that time involved in counseling/coordination of care.    Ned Card ANP/GNP-BC   02/20/2019  12:39 PM This was a shared visit with Ned Card.  We reviewed the CT images with Summer Nielsen.  CTs are consistent with stable and no evidence of disease pression.  Infiltrative lung changes appear chronic.  She appears to have chronic lung disease.  She will continue follow-up with cardiology pulmonary medicine as needed.  She has now maintained on as needed home oxygen.  We reviewed remote CT images and the lung changes were present prior to beginning chemotherapy.  We discussed observation  versus continuing gemcitabine/Abraxane.  She would like therapy.  Chemotherapy will be changed to a 3-week schedule.  Julieanne Manson, MD

## 2019-02-20 NOTE — Patient Instructions (Signed)
[facility] Discharge Instructions for Patients Receiving Chemotherapy  Today you received the following chemotherapy agents: Abraxane/Gemzar.  To help prevent nausea and vomiting after your treatment, we encourage you to take your nausea medication as directed.   If you develop nausea and vomiting that is not controlled by your nausea medication, call the clinic.   BELOW ARE SYMPTOMS THAT SHOULD BE REPORTED IMMEDIATELY:  *FEVER GREATER THAN 100.5 F  *CHILLS WITH OR WITHOUT FEVER  NAUSEA AND VOMITING THAT IS NOT CONTROLLED WITH YOUR NAUSEA MEDICATION  *UNUSUAL SHORTNESS OF BREATH  *UNUSUAL BRUISING OR BLEEDING  TENDERNESS IN MOUTH AND THROAT WITH OR WITHOUT PRESENCE OF ULCERS  *URINARY PROBLEMS  *BOWEL PROBLEMS  UNUSUAL RASH Items with * indicate a potential emergency and should be followed up as soon as possible.  Feel free to call the clinic should you have any questions or concerns. The clinic phone number is [phone].  Please show the CHEMO ALERT CARD at check-in to the Emergency Department and triage nurse.   

## 2019-02-22 ENCOUNTER — Telehealth: Payer: Self-pay | Admitting: Oncology

## 2019-02-22 NOTE — Telephone Encounter (Signed)
Scheduled per los. Called and spoke with patient. Confirmed appt

## 2019-02-26 ENCOUNTER — Telehealth: Payer: Self-pay | Admitting: Nurse Practitioner

## 2019-02-26 ENCOUNTER — Other Ambulatory Visit: Payer: Self-pay | Admitting: Nurse Practitioner

## 2019-02-26 DIAGNOSIS — R0902 Hypoxemia: Secondary | ICD-10-CM

## 2019-02-26 DIAGNOSIS — R0602 Shortness of breath: Secondary | ICD-10-CM

## 2019-02-26 NOTE — Telephone Encounter (Signed)
Left message to call office

## 2019-02-26 NOTE — Telephone Encounter (Signed)
Pt notified referral has been placed.

## 2019-02-26 NOTE — Telephone Encounter (Signed)
Please call her back - I am happy to place a referral to anyone but him - they may let her see someone else or maybe she could see Tammy Parrott?  Cecille Rubin

## 2019-02-26 NOTE — Telephone Encounter (Signed)
New message:    Patient calling stating that she has some question concering her oxygen tank. Please call patient.

## 2019-02-26 NOTE — Progress Notes (Signed)
Referral placed per patient request.   Burtis Junes, RN, [facility] [address] [place], [place]  [phone]

## 2019-02-26 NOTE — Telephone Encounter (Signed)
I spoke with pt. She is asking if Summer Merle, NP could refer her to a pulmonary doctor. She also states her portable oxygen tank is heavy.  She will contact oxygen provider to see if anything can be done to help with this.

## 2019-03-05 ENCOUNTER — Other Ambulatory Visit: Payer: Self-pay | Admitting: Oncology

## 2019-03-10 ENCOUNTER — Other Ambulatory Visit: Payer: Self-pay | Admitting: Oncology

## 2019-03-13 ENCOUNTER — Inpatient Hospital Stay: Payer: Medicare PPO

## 2019-03-13 ENCOUNTER — Inpatient Hospital Stay (HOSPITAL_COMMUNITY): Payer: Medicare PPO

## 2019-03-13 ENCOUNTER — Inpatient Hospital Stay (HOSPITAL_COMMUNITY)
Admission: AD | Admit: 2019-03-13 | Discharge: 2019-03-15 | DRG: 948 | Disposition: A | Discharge status: Home / Self Care | Payer: Medicare PPO | Source: Ambulatory Visit | Attending: Oncology | Admitting: Oncology

## 2019-03-13 ENCOUNTER — Other Ambulatory Visit: Payer: Self-pay

## 2019-03-13 ENCOUNTER — Inpatient Hospital Stay: Payer: Medicare PPO | Attending: Oncology | Admitting: Nurse Practitioner

## 2019-03-13 ENCOUNTER — Encounter: Payer: Self-pay | Admitting: Nurse Practitioner

## 2019-03-13 VITALS — BP 179/88 | HR 87 | Temp 98.3°F | Resp 17 | Ht 62.0 in | Wt 126.1 lb

## 2019-03-13 DIAGNOSIS — I5032 Chronic diastolic (congestive) heart failure: Secondary | ICD-10-CM | POA: Diagnosis present

## 2019-03-13 DIAGNOSIS — R4701 Aphasia: Secondary | ICD-10-CM | POA: Diagnosis present

## 2019-03-13 DIAGNOSIS — Z9221 Personal history of antineoplastic chemotherapy: Secondary | ICD-10-CM

## 2019-03-13 DIAGNOSIS — C787 Secondary malignant neoplasm of liver and intrahepatic bile duct: Secondary | ICD-10-CM | POA: Diagnosis present

## 2019-03-13 DIAGNOSIS — R4182 Altered mental status, unspecified: Principal | ICD-10-CM | POA: Diagnosis present

## 2019-03-13 DIAGNOSIS — C221 Intrahepatic bile duct carcinoma: Secondary | ICD-10-CM | POA: Insufficient documentation

## 2019-03-13 DIAGNOSIS — E890 Postprocedural hypothyroidism: Secondary | ICD-10-CM | POA: Diagnosis present

## 2019-03-13 DIAGNOSIS — Z96642 Presence of left artificial hip joint: Secondary | ICD-10-CM | POA: Diagnosis present

## 2019-03-13 DIAGNOSIS — R0609 Other forms of dyspnea: Secondary | ICD-10-CM | POA: Insufficient documentation

## 2019-03-13 DIAGNOSIS — Z79899 Other long term (current) drug therapy: Secondary | ICD-10-CM | POA: Diagnosis not present

## 2019-03-13 DIAGNOSIS — Z95828 Presence of other vascular implants and grafts: Secondary | ICD-10-CM

## 2019-03-13 DIAGNOSIS — Z79890 Hormone replacement therapy (postmenopausal): Secondary | ICD-10-CM | POA: Diagnosis not present

## 2019-03-13 DIAGNOSIS — I110 Hypertensive heart disease with heart failure: Secondary | ICD-10-CM | POA: Diagnosis present

## 2019-03-13 DIAGNOSIS — Z96651 Presence of right artificial knee joint: Secondary | ICD-10-CM | POA: Diagnosis present

## 2019-03-13 DIAGNOSIS — E860 Dehydration: Secondary | ICD-10-CM | POA: Diagnosis present

## 2019-03-13 DIAGNOSIS — Z20822 Contact with and (suspected) exposure to covid-19: Secondary | ICD-10-CM | POA: Diagnosis present

## 2019-03-13 DIAGNOSIS — N390 Urinary tract infection, site not specified: Secondary | ICD-10-CM | POA: Diagnosis present

## 2019-03-13 DIAGNOSIS — Z5111 Encounter for antineoplastic chemotherapy: Secondary | ICD-10-CM | POA: Insufficient documentation

## 2019-03-13 LAB — CMP (CANCER CENTER ONLY)
ALT: 14 U/L (ref 0–44)
AST: 25 U/L (ref 15–41)
Albumin: 3.6 g/dL (ref 3.5–5.0)
Alkaline Phosphatase: 79 U/L (ref 38–126)
Anion gap: 12 (ref 5–15)
BUN: 26 mg/dL — ABNORMAL HIGH (ref 8–23)
CO2: 25 mmol/L (ref 22–32)
Calcium: 9.6 mg/dL (ref 8.9–10.3)
Chloride: 102 mmol/L (ref 98–111)
Creatinine: 1.5 mg/dL — ABNORMAL HIGH (ref 0.44–1.00)
GFR, Est AFR Am: 35 mL/min — ABNORMAL LOW (ref >60)
GFR, Estimated: 31 mL/min — ABNORMAL LOW (ref >60)
Glucose, Bld: 121 mg/dL — ABNORMAL HIGH (ref 70–99)
Potassium: 4.2 mmol/L (ref 3.5–5.1)
Sodium: 139 mmol/L (ref 135–145)
Total Bilirubin: 0.6 mg/dL (ref 0.3–1.2)
Total Protein: 7.6 g/dL (ref 6.5–8.1)

## 2019-03-13 LAB — CBC WITH DIFFERENTIAL (CANCER CENTER ONLY)
Abs Immature Granulocytes: 0.05 10*3/uL (ref 0.00–0.07)
Basophils Absolute: 0.1 10*3/uL (ref 0.0–0.1)
Basophils Relative: 1 %
Eosinophils Absolute: 0.8 10*3/uL — ABNORMAL HIGH (ref 0.0–0.5)
Eosinophils Relative: 8 %
HCT: 31.6 % — ABNORMAL LOW (ref 36.0–46.0)
Hemoglobin: 10.4 g/dL — ABNORMAL LOW (ref 12.0–15.0)
Immature Granulocytes: 1 %
Lymphocytes Relative: 9 %
Lymphs Abs: 0.8 10*3/uL (ref 0.7–4.0)
MCH: 32.4 pg (ref 26.0–34.0)
MCHC: 32.9 g/dL (ref 30.0–36.0)
MCV: 98.4 fL (ref 80.0–100.0)
Monocytes Absolute: 1.3 10*3/uL — ABNORMAL HIGH (ref 0.1–1.0)
Monocytes Relative: 14 %
Neutro Abs: 6.6 10*3/uL (ref 1.7–7.7)
Neutrophils Relative %: 67 %
Platelet Count: 476 10*3/uL — ABNORMAL HIGH (ref 150–400)
RBC: 3.21 MIL/uL — ABNORMAL LOW (ref 3.87–5.11)
RDW: 15.5 % (ref 11.5–15.5)
WBC Count: 9.6 10*3/uL (ref 4.0–10.5)
nRBC: 0 % (ref 0.0–0.2)

## 2019-03-13 LAB — URINALYSIS, COMPLETE (UACMP) WITH MICROSCOPIC
Bacteria, UA: NONE SEEN
Bilirubin Urine: NEGATIVE
Glucose, UA: NEGATIVE mg/dL
Ketones, ur: NEGATIVE mg/dL
Nitrite: NEGATIVE
Protein, ur: >=300 mg/dL — AB
Specific Gravity, Urine: 1.02 (ref 1.005–1.030)
pH: 5 (ref 5.0–8.0)

## 2019-03-13 LAB — SAMPLE TO BLOOD BANK

## 2019-03-13 MED ORDER — GABAPENTIN 300 MG PO CAPS
300.0000 mg | ORAL_CAPSULE | Freq: Three times a day (TID) | ORAL | Status: DC
  Administered 2019-03-13 – 2019-03-15 (×5): 300 mg via ORAL
  Filled 2019-03-13 (×5): qty 1

## 2019-03-13 MED ORDER — LATANOPROST 0.005 % OP SOLN
1.0000 [drp] | Freq: Every day | OPHTHALMIC | Status: DC
  Administered 2019-03-13 – 2019-03-14 (×2): 1 [drp] via OPHTHALMIC
  Filled 2019-03-13: qty 2.5

## 2019-03-13 MED ORDER — SODIUM CHLORIDE 0.9 % IV SOLN
INTRAVENOUS | Status: AC
  Filled 2019-03-13 (×2): qty 250

## 2019-03-13 MED ORDER — PROPRANOLOL HCL 20 MG PO TABS
20.0000 mg | ORAL_TABLET | Freq: Two times a day (BID) | ORAL | Status: DC
  Administered 2019-03-13 – 2019-03-15 (×4): 20 mg via ORAL
  Filled 2019-03-13 (×4): qty 1

## 2019-03-13 MED ORDER — LEVOTHYROXINE SODIUM 50 MCG PO TABS
150.0000 ug | ORAL_TABLET | Freq: Every day | ORAL | Status: DC
  Administered 2019-03-14 – 2019-03-15 (×2): 150 ug via ORAL
  Filled 2019-03-13 (×2): qty 1

## 2019-03-13 MED ORDER — PANTOPRAZOLE SODIUM 40 MG PO TBEC
40.0000 mg | DELAYED_RELEASE_TABLET | Freq: Every day | ORAL | Status: DC
  Administered 2019-03-14 – 2019-03-15 (×2): 40 mg via ORAL
  Filled 2019-03-13 (×2): qty 1

## 2019-03-13 MED ORDER — LIDOCAINE-PRILOCAINE 2.5-2.5 % EX CREA: 1.0000 "application " | TOPICAL_CREAM | CUTANEOUS | Status: DC

## 2019-03-13 MED ORDER — SODIUM CHLORIDE 0.9 % IV SOLN: INTRAVENOUS | Status: DC

## 2019-03-13 MED ORDER — ACETAMINOPHEN 500 MG PO TABS: 1000.0000 mg | ORAL_TABLET | Freq: Every day | ORAL | Status: DC | PRN

## 2019-03-13 MED ORDER — CEPHALEXIN 250 MG PO CAPS
250.0000 mg | ORAL_CAPSULE | Freq: Three times a day (TID) | ORAL | Status: DC
  Administered 2019-03-13 – 2019-03-15 (×5): 250 mg via ORAL
  Filled 2019-03-13 (×7): qty 1

## 2019-03-13 MED ORDER — SODIUM CHLORIDE 0.9% FLUSH
10.0000 mL | INTRAVENOUS | Status: DC | PRN
  Administered 2019-03-13: 10 mL
  Filled 2019-03-13: qty 10

## 2019-03-13 MED ORDER — LOSARTAN POTASSIUM 50 MG PO TABS
50.0000 mg | ORAL_TABLET | Freq: Every day | ORAL | Status: DC
  Administered 2019-03-14 – 2019-03-15 (×2): 50 mg via ORAL
  Filled 2019-03-13 (×2): qty 1

## 2019-03-13 MED ORDER — PROCHLORPERAZINE MALEATE 10 MG PO TABS: 5.0000 mg | ORAL_TABLET | Freq: Four times a day (QID) | ORAL | Status: DC | PRN

## 2019-03-13 NOTE — Patient Instructions (Signed)
Dehydration, Adult Dehydration is a condition in which there is not enough water or other fluids in the body. This happens when a person loses more fluids than he or she takes in. Important organs, such as the kidneys, brain, and heart, cannot function without a proper amount of fluids. Any loss of fluids from the body can lead to dehydration. Dehydration can be mild, moderate, or severe. It should be treated right away to prevent it from becoming severe. What are the causes? Dehydration may be caused by:  Conditions that cause loss of water or other fluids, such as diarrhea, vomiting, or sweating or urinating a lot.  Not drinking enough fluids, especially when you are ill or doing activities that require a lot of energy.  Other illnesses and conditions, such as fever or infection.  Certain medicines, such as medicines that remove excess fluid from the body (diuretics).  Lack of safe drinking water.  Not being able to get enough water and food. What increases the risk? The following factors may make you more likely to develop this condition:  Having a long-term (chronic) illness that has not been treated properly, such as diabetes, heart disease, or kidney disease.  Being 84 years of age or older.  Having a disability.  Living in a place that is high in altitude, where thinner, drier air causes more fluid loss.  Doing exercises that put stress on your body for a long time (endurance sports). What are the signs or symptoms? Symptoms of dehydration depend on how severe it is. Mild or moderate dehydration  Thirst.  Dry lips or dry mouth.  Dizziness or light-headedness, especially when standing up from a seated position.  Muscle cramps.  Dark urine. Urine may be the color of tea.  Less urine or tears produced than usual.  Headache. Severe dehydration  Changes in skin. Your skin may be cold and clammy, blotchy, or pale. Your skin also may not return to normal after being  lightly pinched and released.  Little or no tears, urine, or sweat.  Changes in vital signs, such as rapid breathing and low blood pressure. Your pulse may be weak or may be faster than 100 beats a minute when you are sitting still.  Other changes, such as: ? Feeling very thirsty. ? Sunken eyes. ? Cold hands and feet. ? Confusion. ? Being very tired (lethargic) or having trouble waking from sleep. ? Short-term weight loss. ? Loss of consciousness. How is this diagnosed? This condition is diagnosed based on your symptoms and a physical exam. You may have blood and urine tests to help confirm the diagnosis. How is this treated? Treatment for this condition depends on how severe it is. Treatment should be started right away. Do not wait until dehydration becomes severe. Severe dehydration is an emergency and needs to be treated in a hospital.  Mild or moderate dehydration can be treated at home. You may be asked to: ? Drink more fluids. ? Drink an oral rehydration solution (ORS). This drink helps restore proper amounts of fluids and salts and minerals in the blood (electrolytes).  Severe dehydration can be treated: ? With IV fluids. ? By correcting abnormal levels of electrolytes. This is often done by giving electrolytes through a tube that is passed through your nose and into your stomach (nasogastric tube, or NG tube). ? By treating the underlying cause of dehydration. Follow these instructions at home: Oral rehydration solution If told by your health care provider, drink an ORS:  Make  an ORS by following instructions on the package.  Start by drinking small amounts, about  cup (120 mL) every 5-10 minutes.  Slowly increase how much you drink until you have taken the amount recommended by your health care provider. Eating and drinking         Drink enough clear fluid to keep your urine pale yellow. If you were told to drink an ORS, finish the ORS first and then start slowly  drinking other clear fluids. Drink fluids such as: ? Water. Do not drink only water. Doing that can lead to hyponatremia, which is having too little salt (sodium) in the body. ? Water from ice chips you suck on. ? Fruit juice that you have added water to (diluted fruit juice). ? Low-calorie sports drinks.  Eat foods that contain a healthy balance of electrolytes, such as bananas, oranges, potatoes, tomatoes, and spinach.  Do not drink alcohol.  Avoid the following: ? Drinks that contain a lot of sugar. These include high-calorie sports drinks, fruit juice that is not diluted, and soda. ? Caffeine. ? Foods that are greasy or contain a lot of fat or sugar. General instructions  Take over-the-counter and prescription medicines only as told by your health care provider.  Do not take sodium tablets. Doing that can lead to having too much sodium in the body (hypernatremia).  Return to your normal activities as told by your health care provider. Ask your health care provider what activities are safe for you.  Keep all follow-up visits as told by your health care provider. This is important. Contact a health care provider if:  You have muscle cramps, pain, or discomfort, such as: ? Pain in your abdomen and the pain gets worse or stays in one area (localizes). ? Stiff neck.  You have a rash.  You are more irritable than usual.  You are sleepier or have a harder time waking than usual.  You feel weak or dizzy.  You feel very thirsty. Get help right away if you have:  Any symptoms of severe dehydration.  Symptoms of vomiting, such as: ? You cannot eat or drink without vomiting. ? Vomiting gets worse or does not go away. ? Vomit includes blood or green matter (bile).  Symptoms that get worse with treatment.  A fever.  A severe headache.  Problems with urination or bowel movements, such as: ? Diarrhea that gets worse or does not go away. ? Blood in your stool (feces). This  may cause stool to look black and tarry. ? Not urinating, or urinating only a small amount of very dark urine, within 6-8 hours.  Trouble breathing. These symptoms may represent a serious problem that is an emergency. Do not wait to see if the symptoms will go away. Get medical help right away. Call your local emergency services (911 in the [place] Do not drive yourself to the hospital. Summary  Dehydration is a condition in which there is not enough water or other fluids in the body. This happens when a person loses more fluids than he or she takes in.  Treatment for this condition depends on how severe it is. Treatment should be started right away. Do not wait until dehydration becomes severe.  Drink enough clear fluid to keep your urine pale yellow. If you were told to drink an oral rehydration solution (ORS), finish the ORS first and then start slowly drinking other clear fluids.  Take over-the-counter and prescription medicines only as told by your health care   provider.  Get help right away if you have any symptoms of severe dehydration. This information is not intended to replace advice given to you by your health care provider. Make sure you discuss any questions you have with your health care provider. Document Revised: 10/05/2018 Document Reviewed: 10/05/2018 [employer] Patient Education  2020 [employer]  

## 2019-03-13 NOTE — H&P (Addendum)
Oncology admission history and physical   Reason for admission: Metastatic cholangiocarcinoma, altered mental status  HPI: Summer Nielsen is an 86 year old woman with metastatic cholangiocarcinoma currently on active treatment with gemcitabine/Abraxane.  She completed cycle 11 on 02/20/2019.  She presents to the office today for routine follow-up prior to proceeding with cycle 12.  She is noted to have word finding difficulty, partial expressive aphasia.  She is being admitted for further evaluation.     Past Medical History:  Diagnosis Date   Cancer ([place])    squamous cell skin cancer on left leg-removed   CHF (congestive heart failure) ([facility])    Cholangiocarcinoma ([place]) dx'd 04/1306   Complication of anesthesia    GERD (gastroesophageal reflux disease)    Glaucoma    High blood pressure    Hyperlipidemia    Hypothyroidism    OP (osteoporosis)    PONV (postoperative nausea and vomiting)    Postsurgical hypothyroidism    Spondylosis of cervical region without myelopathy or radiculopathy    Tachycardia    TIA (transient ischemic attack)   :   Past Surgical History:  Procedure Laterality Date   ABDOMINAL HYSTERECTOMY     BACK SURGERY N/A 03/2017   Lumbar   CHOLECYSTECTOMY     EYE SURGERY     removed cateracts on both eyes   IR IMAGING GUIDED PORT INSERTION  09/06/2018   JOINT REPLACEMENT     3 hip replacement on left, right knee replacement   THYROIDECTOMY    :  No current facility-administered medications for this encounter.  Current Outpatient Medications:    acetaminophen (TYLENOL) 500 MG tablet, Take 1,000 mg by mouth daily as needed for moderate pain or headache., Disp: , Rfl:    bimatoprost (LUMIGAN) 0.01 % SOLN, Place 1 drop into both eyes at bedtime. , Disp: , Rfl:    calcium-vitamin D (OSCAL WITH D) 500-200 MG-UNIT tablet, Take 1 tablet by mouth daily. , Disp: , Rfl:    diphenoxylate-atropine (LOMOTIL) 2.5-0.025 MG tablet, Take 2 tablets by mouth 4 (four) times daily  as needed for diarrhea or loose stools., Disp: 60 tablet, Rfl: 0   furosemide (LASIX) 40 MG tablet, Take 40 mg by mouth 2 (two) times daily. Take 60 mg in the AM and 40 mg after lunch, Disp: , Rfl:    gabapentin (NEURONTIN) 300 MG capsule, TAKE 1 CAPSULE BY MOUTH THREE TIMES A DAY, Disp: 270 capsule, Rfl: 3   HYDROmorphone (DILAUDID) 2 MG tablet, Take 0.5-1 tablets (1-2 mg total) by mouth every 4 (four) hours as needed for severe pain., Disp: 30 tablet, Rfl: 0   ibuprofen (ADVIL,MOTRIN) 200 MG tablet, Take 200 mg by mouth every 6 (six) hours as needed for moderate pain (back surgery)., Disp: , Rfl:    Iron-FA-B Cmp-C-Biot-Probiotic (FUSION PLUS) CAPS, Take 1 capsule by mouth daily., Disp: , Rfl:    KLOR-CON M10 10 MEQ tablet, TAKE 2 TABLETS EVERY DAY, Disp: 180 tablet, Rfl: 0   levothyroxine (SYNTHROID, LEVOTHROID) 150 MCG tablet, Take 150 mcg by mouth daily., Disp: , Rfl:    lidocaine-prilocaine (EMLA) cream, Apply 1 application topically as directed., Disp: 30 g, Rfl: 0   loperamide (IMODIUM) 2 MG capsule, Take 2 capsules by mouth 4 (four) times daily as needed., Disp: , Rfl:    losartan (COZAAR) 50 MG tablet, Take 50 mg by mouth daily., Disp: , Rfl:    omeprazole (PRILOSEC) 40 MG capsule, TAKE 1 CAPSULE 30 MINUTES BEFORE MORNING MEAL, Disp: , Rfl:  prochlorperazine (COMPAZINE) 5 MG tablet, Take 1-2 tablets (5-10 mg total) by mouth every 6 (six) hours as needed for nausea or vomiting., Disp: 60 tablet, Rfl: 1   propranolol (INDERAL) 20 MG tablet, Take 20 mg by mouth 2 (two) times daily. , Disp: , Rfl:   Facility-Administered Medications Ordered in Other Encounters:    0.9 %  sodium chloride infusion, , Intravenous, Continuous, Owens Shark, NP, Last Rate: 500 mL/hr at 03/13/19 1327, New Bag at 03/13/19 1327:  :   Allergies  Allergen Reactions   Levofloxacin Nausea Only    Dizzy, hot, foot pain, insomnia   Percocet [Oxycodone-Acetaminophen] Nausea And Vomiting and Other (See Comments)     sick  :  SOCIAL HISTORY: She lives in [place] with her husband.  Review of Systems: She denies nausea/vomiting.  No mouth sores.  No diarrhea.  She has stable dyspnea on exertion.  No fever or cough.  She initially described her appetite is "pretty good".  Later in the conversation she reports she is not eating well and is losing weight.  She denies pain.  She feels that something is "not quite right".  Her main complaint is that her eyes are bothering her.  She denies diplopia.  No unusual headaches.  Physical Exam: Temperature 98.3, heart rate 87, respirations 17, blood pressure 179/88, oxygen saturation 93%, weight 126.1 pounds  HEENT: Pupils equal round and reactive to light.  Extraocular movements intact.  Sclera anicteric. Lungs: Faint rales at both lung bases.  Breath sounds diminished at the right lower lung field.  No respiratory distress. Cardiac: Regular rate and rhythm. Abdomen: Abdomen soft and nontender.  No hepatosplenomegaly. Vascular: Trace edema at the lower legs bilaterally. Neurologic: Alert.  Follows commands.  Moves all extremities.  Word finding difficulty/partial expressive aphasia.  She is unable to name common objects or state their function.  Face is symmetric. Skin: Periorbital skin is erythematous.  Faint rash over the forehead as well.  LABS:   Recent Labs    03/13/19 1102  WBC 9.6  HGB 10.4*  HCT 31.6*  PLT 476*     Recent Labs    03/13/19 1102  NA 139  K 4.2  CL 102  CO2 25  GLUCOSE 121*  BUN PENDING  CREATININE 1.50*  CALCIUM 9.6      RADIOLOGY:  CT Chest W Contrast  Result Date: 02/16/2019 CLINICAL DATA:  86 year old female with history of cholangiocarcinoma undergoing chemotherapy. Restaging examination. EXAM: CT CHEST AND ABDOMEN WITH CONTRAST TECHNIQUE: Multidetector CT imaging of the chest and abdomen was performed following the standard protocol during bolus administration of intravenous contrast. CONTRAST:  54m  OMNIPAQUE IOHEXOL 300 MG/ML  SOLN COMPARISON:  CT the abdomen and pelvis 11/20/2018. FINDINGS: CT CHEST FINDINGS Cardiovascular: Heart size is mildly enlarged. There is no significant pericardial fluid, thickening or pericardial calcification. There is aortic atherosclerosis, as well as atherosclerosis of the great vessels of the mediastinum and the coronary arteries, including calcified atherosclerotic plaque in the left main, left anterior descending, left circumflex and right coronary arteries. Right internal jugular single-lumen porta cath with tip terminating in the right atrium. Mediastinum/Nodes: No pathologically enlarged mediastinal or hilar lymph nodes. Esophagus is unremarkable in appearance. No axillary lymphadenopathy. Lungs/Pleura: Patchy areas of ground-glass attenuation, septal thickening, subpleural reticulation and mild cylindrical traction bronchiectasis are noted throughout the lungs bilaterally, when compared to prior CT the abdomen and pelvis dated 01/23/2018, these findings in the visualized lung bases on the prior study appear less  apparent than on today's examination, suggesting a progressive process. No acute consolidative airspace disease. Trace bilateral pleural effusions and/or pleural thickening, new compared to the prior study. A few scattered calcified granulomas are noted in the lungs bilaterally. No other suspicious appearing pulmonary nodules or masses are noted. Concerning for interstitial lung disease. Musculoskeletal: There are no aggressive appearing lytic or blastic lesions noted in the visualized portions of the skeleton. Chronic appearing compression fracture of T12 with 30% loss of anterior vertebral body height. CT ABDOMEN FINDINGS Hepatobiliary: Multiple hypovascular lesions are again noted in the liver, very similar to the prior study from 11/20/2018. The largest of these is between segment 4A and 4B (axial image 55 of series 2) where there is a relatively well-defined  centrally low-attenuation area measuring 2.9 x 2.4 cm (previously 3.3 x 2.5 cm). The other dominant lesion in segment 8 (axial image 51 of series 2) currently measures 2.0 x 1.7 cm (previously 2.1 x 1.8 cm). Several other subtle hypovascular areas appear similar to the prior examination likely to represent additional regions of involvement. No new hepatic lesions. No intrahepatic biliary ductal dilatation. Status post cholecystectomy. Common bile duct remains mildly dilated measuring 1.4 cm in the porta hepatis, similar to the prior study, likely reflective of benign post cholecystectomy physiology. Pancreas: No pancreatic mass. No pancreatic ductal dilatation. No pancreatic or peripancreatic fluid collections or inflammatory changes. Spleen: Unremarkable. Adrenals/Urinary Tract: Severe left renal atrophy again noted. Low-attenuation lesions in the right kidney, compatible with simple cysts, largest of which is in the upper pole measuring 2.9 cm in diameter. No hydroureteronephrosis in the visualized portions of the abdomen. Bilateral adrenal glands are normal in appearance. Stomach/Bowel: Normal appearance of the stomach. No pathologic dilatation of visualized small bowel or colon. Several colonic diverticulae are noted in the descending colon. Vascular/Lymphatic: Aortic atherosclerosis, without evidence of aneurysm or dissection in the abdominal vasculature. No lymphadenopathy noted in the abdomen. Other: Trace volume of perihepatic ascites. No pneumoperitoneum noted in the visualized portions of the peritoneal cavity. Musculoskeletal: There are no aggressive appearing lytic or blastic lesions noted in the visualized portions of the skeleton. Orthopedic fixation hardware in the lower lumbar spine and sacrum incompletely imaged. IMPRESSION: 1. Multifocal hypovascular liver lesions, grossly stable compared to the prior study from 11/20/2018. No definite signs of progressive or metastatic disease on today's  examination. 2. Chronic changes of interstitial lung disease redemonstrated which appears mildly progressive compared to remote prior examinations, with a spectrum of findings considered probable usual interstitial pneumonia (UIP) per current ATS guidelines. 3. New trace bilateral pleural effusions and/or areas of pleural thickening. 4. Colonic diverticulosis. 5. Mild cardiomegaly. 6. Aortic atherosclerosis, in addition to left main and 3 vessel coronary artery disease. 7. Additional incidental findings, as above. Electronically Signed   By: Vinnie Langton M.D.   On: 02/16/2019 16:38   CT Abdomen W Contrast  Result Date: 02/16/2019 CLINICAL DATA:  86 year old female with history of cholangiocarcinoma undergoing chemotherapy. Restaging examination. EXAM: CT CHEST AND ABDOMEN WITH CONTRAST TECHNIQUE: Multidetector CT imaging of the chest and abdomen was performed following the standard protocol during bolus administration of intravenous contrast. CONTRAST:  51m OMNIPAQUE IOHEXOL 300 MG/ML  SOLN COMPARISON:  CT the abdomen and pelvis 11/20/2018. FINDINGS: CT CHEST FINDINGS Cardiovascular: Heart size is mildly enlarged. There is no significant pericardial fluid, thickening or pericardial calcification. There is aortic atherosclerosis, as well as atherosclerosis of the great vessels of the mediastinum and the coronary arteries, including calcified atherosclerotic plaque in the  left main, left anterior descending, left circumflex and right coronary arteries. Right internal jugular single-lumen porta cath with tip terminating in the right atrium. Mediastinum/Nodes: No pathologically enlarged mediastinal or hilar lymph nodes. Esophagus is unremarkable in appearance. No axillary lymphadenopathy. Lungs/Pleura: Patchy areas of ground-glass attenuation, septal thickening, subpleural reticulation and mild cylindrical traction bronchiectasis are noted throughout the lungs bilaterally, when compared to prior CT the abdomen  and pelvis dated 01/23/2018, these findings in the visualized lung bases on the prior study appear less apparent than on today's examination, suggesting a progressive process. No acute consolidative airspace disease. Trace bilateral pleural effusions and/or pleural thickening, new compared to the prior study. A few scattered calcified granulomas are noted in the lungs bilaterally. No other suspicious appearing pulmonary nodules or masses are noted. Concerning for interstitial lung disease. Musculoskeletal: There are no aggressive appearing lytic or blastic lesions noted in the visualized portions of the skeleton. Chronic appearing compression fracture of T12 with 30% loss of anterior vertebral body height. CT ABDOMEN FINDINGS Hepatobiliary: Multiple hypovascular lesions are again noted in the liver, very similar to the prior study from 11/20/2018. The largest of these is between segment 4A and 4B (axial image 55 of series 2) where there is a relatively well-defined centrally low-attenuation area measuring 2.9 x 2.4 cm (previously 3.3 x 2.5 cm). The other dominant lesion in segment 8 (axial image 51 of series 2) currently measures 2.0 x 1.7 cm (previously 2.1 x 1.8 cm). Several other subtle hypovascular areas appear similar to the prior examination likely to represent additional regions of involvement. No new hepatic lesions. No intrahepatic biliary ductal dilatation. Status post cholecystectomy. Common bile duct remains mildly dilated measuring 1.4 cm in the porta hepatis, similar to the prior study, likely reflective of benign post cholecystectomy physiology. Pancreas: No pancreatic mass. No pancreatic ductal dilatation. No pancreatic or peripancreatic fluid collections or inflammatory changes. Spleen: Unremarkable. Adrenals/Urinary Tract: Severe left renal atrophy again noted. Low-attenuation lesions in the right kidney, compatible with simple cysts, largest of which is in the upper pole measuring 2.9 cm in  diameter. No hydroureteronephrosis in the visualized portions of the abdomen. Bilateral adrenal glands are normal in appearance. Stomach/Bowel: Normal appearance of the stomach. No pathologic dilatation of visualized small bowel or colon. Several colonic diverticulae are noted in the descending colon. Vascular/Lymphatic: Aortic atherosclerosis, without evidence of aneurysm or dissection in the abdominal vasculature. No lymphadenopathy noted in the abdomen. Other: Trace volume of perihepatic ascites. No pneumoperitoneum noted in the visualized portions of the peritoneal cavity. Musculoskeletal: There are no aggressive appearing lytic or blastic lesions noted in the visualized portions of the skeleton. Orthopedic fixation hardware in the lower lumbar spine and sacrum incompletely imaged. IMPRESSION: 1. Multifocal hypovascular liver lesions, grossly stable compared to the prior study from 11/20/2018. No definite signs of progressive or metastatic disease on today's examination. 2. Chronic changes of interstitial lung disease redemonstrated which appears mildly progressive compared to remote prior examinations, with a spectrum of findings considered probable usual interstitial pneumonia (UIP) per current ATS guidelines. 3. New trace bilateral pleural effusions and/or areas of pleural thickening. 4. Colonic diverticulosis. 5. Mild cardiomegaly. 6. Aortic atherosclerosis, in addition to left main and 3 vessel coronary artery disease. 7. Additional incidental findings, as above. Electronically Signed   By: Vinnie Langton M.D.   On: 02/16/2019 16:38    Assessment and Plan:   Anterior liver mass, ultrasound-guided biopsy 09/22/2017-adenocarcinoma, MSI-stable, tumor mutation burden 0, IDH1 R132G, BAP1 loss CT chest 09/07/2017, 4.1  cm hypodense anterior liver mass, changes of chronic hypersensitivity pneumonitis MRI abdomen 09/13/2017-isolated liver mass measuring 4.7 x 3.8 cm involving segments 4B and 5, no additional  liver masses SBRT to the liver mass beginning 10/31/2017, completed 11/10/2017 CT abdomen/pelvis 01/23/2018- radiation changes in the liver, liver mass measuring 5.9 x 4.4 x 5.0 cm but no evidence of metastatic disease in the abdomen or pelvis MRI abdomen 02/22/2018- dominant central left liver lesion slightly increased in size with decreased central enhancement suggesting necrosis, 2 new lesions including an 11 mm left hepatic lesion and a 9 mm right hepatic lesion Xeloda 7 days on/7 days off beginning 03/27/2018 (1500 mg every morning and 1000 mg every afternoon) Xeloda dose reduced to 1000 mg every morning and 1000 mg every afternoon beginning 04/24/2018 due to diarrhea Xeloda discontinued after an office visit 05/15/2018 secondary to hand/foot syndrome and diarrhea CT abdomen/pelvis 06/12/2018- decrease in size of dominant left hepatic mass, no significant change in smaller liver lesions, stable biliary ductal dilatation, diffuse "colitis " CT 08/31/2018- decreased size of the segment 5 lesion, increased size of other liver lesions and new lesions, no evidence of extrahepatic metastatic disease, persistent colonic thickening Cycle 1 gemcitabine/Abraxane 09/12/2018 Cycle 2 gemcitabine/Abraxane 09/27/2018 Cycle 3 gemcitabine/Abraxane 10/10/2018 Cycle 4 gemcitabine/Abraxane 10/24/2018 Cycle 5 gemcitabine/Abraxane 11/07/2018 CT 11/20/2018, unchanged dominant segment 5 lesion, interval enlargement of local small hypodense lesions in the right liver, no evidence of distant metastatic disease, colonic wall thickening-resolved CTs reviewed by Dr. Primus Gritton/radiologist, consistent with stable disease Cycle 6 gemcitabine/Abraxane 12/05/2018 Cycle 7 gemcitabine/Abraxane 12/19/2018 Cycle 8 gemcitabine/Abraxane 01/02/2019 Cycle 9 gemcitabine/Abraxane 01/16/2019 Cycle 10 gemcitabine/Abraxane 02/06/2019  CTs 02/16/2019-stable liver lesions.  Chronic changes of interstitial lung disease, appears mildly progressive.  New trace  bilateral pleural effusions and/or areas of pleural thickening. Cycle 11 gemcitabine/Abraxane 02/20/2019 (schedule changed to every 3 weeks going forward) Abdominal pain History of diastolic heart failure Hypertension G3, P3 History of anemia Right foot cellulitis 05/22/2018-Keflex prescribed 05/23/2018 C. difficile colitis 06/14/2018-treated with vancomycin, recurrent diarrhea 07/04/2018- second course of vancomycin (prolonged taper) prescribed, persistent diarrhea 08/06/2018-improved after diet change   Summer Nielsen has metastatic cholangiocarcinoma.  She has completed 11 cycles of gemcitabine/Abraxane.  She presents today for routine follow-up prior to cycle 12.  She is noted to have word finding difficulty, partial expressive aphasia.  She is being admitted for further evaluation. The plan is to hydrate overnight and obtain a brain MRI tomorrow if renal function is better, symptoms persist and no other explanation for the altered mental status is found.   I contacted her son, Summer Nielsen, by phone to review the above.    Ned Card, NP 03/13/2019, 2:07 PM  Summer Nielsen was interviewed and examined.  She is admitted with altered mental status.  Onset of the mental status changes unclear.  We will perform additional diagnostic evaluation to include urine culture, chest x-ray, ammonia level, brain imaging.  We contacted her family to discuss the situation. I suspect she is dehydrated.  She will receive intravenous hydration overnight.  The plan is to proceed with a brain MRI on 03/14/2019 if the creatinine has improved  Julieanne Manson, MD

## 2019-03-13 NOTE — Progress Notes (Signed)
[facility] OFFICE PROGRESS NOTE   Diagnosis: Cholangiocarcinoma  INTERVAL HISTORY:   Summer Nielsen returns as scheduled.  She completed cycle 11 gemcitabine/Abraxane 02/20/2019.  She denies nausea/vomiting.  No mouth sores.  No diarrhea.  She has stable dyspnea on exertion.  She denies cough and fever.  She initially described her appetite as "pretty good".  Later in our conversation she reported she was not eating well.  She has lost weight since her last visit.  She denies pain.  Her main complaint today is "eyes are bothering me".  She denies diplopia.  She feels that something is not quite right.  Objective:  Vital signs in last 24 hours:  Blood pressure (!) 179/88, pulse 87, temperature 98.3 F (36.8 C), temperature source Temporal, resp. rate 17, height _0  (1.575 m), weight 126 lb 1.6 oz (57.2 kg), SpO2 93 %.    HEENT: No thrush or ulcers.  Bilateral periorbital regions with erythema.  Scattered patches of erythema on the forehead. Resp: Rales at the lower lung fields.  Breath sounds diminished right lower lung field.  No respiratory distress. Cardio: Regular rate and rhythm. GI: Abdomen soft and nontender.  No hepatomegaly. Vascular: Trace edema at the lower legs bilaterally. Neuro: Alert.  Follows commands.  Moves all extremities.  Word finding difficulty, partial expressive aphasia.  Port-A-Cath without erythema.  Lab Results:  Lab Results  Component Value Date   WBC 9.6 03/13/2019   HGB 10.4 (L) 03/13/2019   HCT 31.6 (L) 03/13/2019   MCV 98.4 03/13/2019   PLT 476 (H) 03/13/2019   NEUTROABS 6.6 03/13/2019    Imaging:  No results found.  Medications: I have reviewed the patient's current medications.  Assessment/Plan: 1. Anterior liver mass, ultrasound-guided biopsy 09/22/2017-adenocarcinoma, MSI-stable, tumor mutation burden 0,IDH1 R132G, BAP1 loss ? CT chest 09/07/2017, 4.1 cm hypodense anterior liver mass, changes of chronic hypersensitivity  pneumonitis ? MRI abdomen 09/13/2017-isolated liver mass measuring 4.7 x 3.8 cm involving segments 4B and 5, no additional liver masses ? SBRT to the liver mass beginning 10/31/2017, completed 11/10/2017 ? CT abdomen/pelvis 01/23/2018-radiation changes in the liver, liver mass measuring 5.9 x 4.4 x 5.0 cm but no evidence of metastatic disease in the abdomen or pelvis ? MRI abdomen 02/22/2018-dominant central left liver lesion slightly increased in size with decreased central enhancement suggesting necrosis, 2 new lesions including an 11 mm left hepatic lesion and a 9 mm right hepatic lesion ? Xeloda 7 days on/7 days off beginning 03/27/2018 (1500 mg every morning and 1000 mg every afternoon) ? Xeloda dose reduced to 1000 mg every morning and 1000 mg every afternoon beginning 04/24/2018 due to diarrhea ? Xeloda discontinued after an office visit 05/15/2018 secondary to hand/foot syndrome and diarrhea ? CT abdomen/pelvis 06/12/2018-decrease in size of dominant left hepatic mass, no significant change in smaller liver lesions, stable biliary ductal dilatation, diffuse "colitis " ? CT 08/31/2018-decreased size of the segment 5 lesion, increased size of other liver lesions and new lesions, no evidence of extrahepatic metastatic disease, persistent colonic thickening ? Cycle 1 gemcitabine/Abraxane 09/12/2018 ? Cycle 2 gemcitabine/Abraxane 09/27/2018 ? Cycle 3 gemcitabine/Abraxane 10/10/2018 ? Cycle 4 gemcitabine/Abraxane 10/24/2018 ? Cycle 5 gemcitabine/Abraxane 11/07/2018 ? CT 11/20/2018, unchanged dominant segment 5 lesion, interval enlargement of local small hypodense lesions in the right liver, no evidence of distant metastatic disease, colonic wall thickening-resolved ? CTs reviewed by Dr. Sherrill/radiologist, consistent with stable disease ? Cycle 6 gemcitabine/Abraxane 12/05/2018 ? Cycle 7 gemcitabine/Abraxane 12/19/2018 ? Cycle 8 gemcitabine/Abraxane 01/02/2019 ?  Cycle 9 gemcitabine/Abraxane 01/16/2019 ? Cycle  10 gemcitabine/Abraxane 02/06/2019  ? CTs 02/16/2019-stable liver lesions.  Chronic changes of interstitial lung disease, appears mildly progressive.  New trace bilateral pleural effusions and/or areas of pleural thickening. ? Cycle 11 gemcitabine/Abraxane 02/20/2019 (schedule changed to every 3 weeks going forward) 2. Abdominal pain 3. History of diastolic heart failure 4. Hypertension 5. G3, P3 6. History of anemia 7. Right foot cellulitis 05/22/2018-Keflex prescribed 05/23/2018 8. C. 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  She has completed 11 cycles of gemcitabine/Abraxane.  Recent restaging CTs show stable to mildly improved liver lesions.   She has word finding difficulty/partial expressive aphasia.  We are admitting her for further evaluation.  Plan for brain MRI tomorrow if renal function is better, altered mental status persists.   Patient seen with Dr. Benay Spice.  Ned Card ANP/GNP-BC   03/13/2019  12:11 PM

## 2019-03-13 NOTE — Patient Instructions (Signed)
[employer], Adult A central line is a thin, flexible tube (catheter) that is put in your vein. It can be used to:  Give you medicine.  Give you food and nutrients. Follow these instructions at home: Caring for the tube   Follow instructions from your doctor about: ? Flushing the tube with saline solution. ? Cleaning the tube and the area around it.  Only flush with clean (sterile) supplies. The supplies should be from your doctor, a pharmacy, or another place that your doctor recommends.  Before you flush the tube or clean the area around the tube: ? Wash your hands with soap and water. If you cannot use soap and water, use hand sanitizer. ? Clean the central line hub with rubbing alcohol. Caring for your skin  Keep the area where the tube was put in clean and dry.  Every day, and when changing the bandage, check the skin around the central line for: ? Redness, swelling, or pain. ? Fluid or blood. ? Warmth. ? Pus. ? A bad smell. General instructions  Keep the tube clamped, unless it is being used.  Keep your supplies in a clean, dry location.  If you or someone else accidentally pulls on the tube, make sure: ? The bandage (dressing) is okay. ? There is no bleeding. ? The tube has not been pulled out.  Do not use scissors or sharp objects near the tube.  Do not swim or let the tube soak in a tub.  Ask your doctor what activities are safe for you. Your doctor may tell you not to lift anything or move your arm too much.  Take over-the-counter and prescription medicines only as told by your doctor.  Change bandages as told by your doctor.  Keep your bandage dry. If a bandage gets wet, have it changed right away.  Keep all follow-up visits as told by your doctor. This is important. Throwing away supplies  Throw away any syringes in a trash (disposal) container that is only for sharp items (sharps container). You can buy a sharps container from a pharmacy, or you  can make one by using an empty hard plastic bottle with a cover.  Place any used bandages or infusion bags into a plastic bag. Throw that bag in the trash. Contact a doctor if:  You have any of these where the tube was put in: ? Redness, swelling, or pain. ? Fluid or blood. ? A warm feeling. ? Pus or a bad smell. Get help right away if:  You have: ? A fever. ? Chills. ? Trouble getting enough air (shortness of breath). ? Trouble breathing. ? Pain in your chest. ? Swelling in your neck, face, chest, or arm.  You are coughing.  You feel your heart beating fast or skipping beats.  You feel dizzy or you pass out (faint).  There are red lines coming from where the tube was put in.  The area where the tube was put in is bleeding and the bleeding will not stop.  Your tube is hard to flush.  You do not get a blood return from the tube.  The tube gets loose or comes out.  The tube has a hole or a tear.  The tube leaks. Summary  A central line is a thin, flexible tube (catheter) that is put in your vein. It can be used to take blood for lab tests or to give you medicine.  Follow instructions from your doctor about flushing and cleaning the  tube.  Keep the area where the tube was put in clean and dry.  Ask your doctor what activities are safe for you. This information is not intended to replace advice given to you by your health care provider. Make sure you discuss any questions you have with your health care provider. Document Revised: 06/14/2018 Document Reviewed: 03/11/2016 Elsevier Patient Education  [employer].

## 2019-03-14 ENCOUNTER — Inpatient Hospital Stay (HOSPITAL_COMMUNITY): Payer: Medicare PPO

## 2019-03-14 DIAGNOSIS — C221 Intrahepatic bile duct carcinoma: Secondary | ICD-10-CM

## 2019-03-14 DIAGNOSIS — R4182 Altered mental status, unspecified: Principal | ICD-10-CM

## 2019-03-14 LAB — URINE CULTURE: Culture: NO GROWTH

## 2019-03-14 LAB — BASIC METABOLIC PANEL
Anion gap: 9 (ref 5–15)
BUN: 23 mg/dL (ref 8–23)
CO2: 24 mmol/L (ref 22–32)
Calcium: 8.7 mg/dL — ABNORMAL LOW (ref 8.9–10.3)
Chloride: 105 mmol/L (ref 98–111)
Creatinine, Ser: 1.26 mg/dL — ABNORMAL HIGH (ref 0.44–1.00)
GFR calc Af Amer: 44 mL/min — ABNORMAL LOW (ref >60)
GFR calc non Af Amer: 38 mL/min — ABNORMAL LOW (ref >60)
Glucose, Bld: 95 mg/dL (ref 70–99)
Potassium: 4 mmol/L (ref 3.5–5.1)
Sodium: 138 mmol/L (ref 135–145)

## 2019-03-14 LAB — AMMONIA: Ammonia: 17 umol/L (ref 9–35)

## 2019-03-14 LAB — SARS CORONAVIRUS 2 (TAT 6-24 HRS): SARS Coronavirus 2: NEGATIVE

## 2019-03-14 MED ORDER — SODIUM CHLORIDE 0.9% FLUSH: 10.0000 mL | INTRAVENOUS | Status: DC | PRN

## 2019-03-14 MED ORDER — SODIUM CHLORIDE 0.9% FLUSH
10.0000 mL | Freq: Two times a day (BID) | INTRAVENOUS | Status: DC
  Administered 2019-03-15: 11:00:00 10 mL

## 2019-03-14 MED ORDER — CHLORHEXIDINE GLUCONATE CLOTH 2 % EX PADS
6.0000 | MEDICATED_PAD | Freq: Every day | CUTANEOUS | Status: DC
  Administered 2019-03-14 – 2019-03-15 (×2): 6 via TOPICAL

## 2019-03-14 MED ORDER — GADOBUTROL 1 MMOL/ML IV SOLN
6.0000 mL | Freq: Once | INTRAVENOUS | Status: AC | PRN
  Administered 2019-03-14: 6 mL via INTRAVENOUS

## 2019-03-14 NOTE — Progress Notes (Signed)
Patient has been alert/oriented the entire night... No changes in mental status from her baseline. Patient has not been in any pain and has rested the entire night except for rounding.

## 2019-03-14 NOTE — Progress Notes (Addendum)
HEMATOLOGY-ONCOLOGY PROGRESS NOTE  SUBJECTIVE: Reports that she is feeling much better this morning.  States that she is hungry wants to eat breakfast.  She feels as though her difficulty finding words has improved.  She has no chest discomfort or shortness of breath.  Denies abdominal pain, nausea, vomiting.  Afebrile.  UA showed trace leukocytes.  Urine culture pending.  Oncology History  Cholangiocarcinoma ([place])  10/13/2017 Initial Diagnosis   Cholangiocarcinoma ([place])   10/13/2017 Cancer Staging   Staging form: Intrahepatic Bile Duct, AJCC 8th Edition - Clinical: Stage IA (cT1a, cN0, cM0) - Signed by Kyung Rudd, MD on 10/13/2017   09/12/2018 -  Chemotherapy   The patient had PACLitaxel-protein bound (ABRAXANE) chemo infusion 175 mg, 100 mg/m2 = 175 mg (100 % of original dose 100 mg/m2), Intravenous,  Once, 6 of 7 cycles Dose modification: 100 mg/m2 (original dose 100 mg/m2, Cycle 1, Reason: Provider Judgment) Administration: 175 mg (09/12/2018), 175 mg (09/27/2018), 175 mg (10/10/2018), 175 mg (10/24/2018), 175 mg (11/07/2018), 175 mg (12/05/2018), 175 mg (12/19/2018), 175 mg (01/02/2019), 175 mg (01/16/2019), 175 mg (02/06/2019), 175 mg (02/20/2019) gemcitabine (GEMZAR) 1,330 mg in sodium chloride 0.9 % 250 mL chemo infusion, 800 mg/m2 = 1,330 mg (100 % of original dose 800 mg/m2), Intravenous,  Once, 6 of 7 cycles Dose modification: 800 mg/m2 (original dose 800 mg/m2, Cycle 1, Reason: Provider Judgment) Administration: 1,330 mg (09/12/2018), 1,330 mg (09/27/2018), 1,330 mg (10/10/2018), 1,330 mg (10/24/2018), 1,330 mg (11/07/2018), 1,330 mg (12/05/2018), 1,330 mg (12/19/2018), 1,330 mg (01/02/2019), 1,330 mg (01/16/2019), 1,330 mg (02/06/2019), 1,330 mg (02/20/2019)  for chemotherapy treatment.      PHYSICAL EXAMINATION:  Vitals:   03/13/19 2131 03/14/19 0623  BP: (!) 189/71 (!) 184/58  Pulse: 70 61  Resp: 20 20  Temp: 99.1 F (37.3 C) 99.3 F (37.4 C)  SpO2: 91% (!) 89%   Filed Weights   03/14/19  0623  Weight: 128 lb 1.4 oz (58.1 kg)    Intake/Output from previous day: 01/05 0701 - 01/06 0700 In: 1740.2 [P.O.:595; I.V.:1145.2] Out: -   GENERAL:alert, no distress and comfortable OROPHARYNX: No thrush or mucositis LUNGS: Faint rales bilateral bases, no respiratory distress HEART: regular rate & rhythm and no murmurs and no lower extremity edema ABDOMEN:abdomen soft, non-tender and normal bowel sounds Musculoskeletal:no cyanosis of digits and no clubbing  NEURO: alert & oriented x 3 with occasional difficulty finding words but this has improved, no focal motor/sensory deficits  LABORATORY DATA:  I have reviewed the data as listed CMP Latest Ref Rng & Units 03/14/2019 03/13/2019 02/20/2019  Glucose 70 - 99 mg/dL 95 121(H) 122(H)  BUN 8 - 23 mg/dL 23 26(H) 22  Creatinine 0.44 - 1.00 mg/dL 1.26(H) 1.50(H) 1.24(H)  Sodium 135 - 145 mmol/L 138 139 140  Potassium 3.5 - 5.1 mmol/L 4.0 4.2 4.1  Chloride 98 - 111 mmol/L 105 102 107  CO2 22 - 32 mmol/L _0 Calcium 8.9 - 10.3 mg/dL 8.7(L) 9.6 7.5(L)  Total Protein 6.5 - 8.1 g/dL - 7.6 6.5  Total Bilirubin 0.3 - 1.2 mg/dL - 0.6 0.4  Alkaline Phos 38 - 126 U/L - 79 68  AST 15 - 41 U/L - 25 19  ALT 0 - 44 U/L - 14 14    Lab Results  Component Value Date   WBC 9.6 03/13/2019   HGB 10.4 (L) 03/13/2019   HCT 31.6 (L) 03/13/2019   MCV 98.4 03/13/2019   PLT 476 (H) 03/13/2019   NEUTROABS 6.6  03/13/2019    X-ray chest PA and lateral  Result Date: 03/13/2019 CLINICAL DATA:  86 year old female with dyspnea. EXAM: CHEST - 2 VIEW COMPARISON:  Chest radiograph dated 08/08/2017. FINDINGS: Right pectoral Port-A-Cath with tip close to the cavoatrial junction. There is diffuse chronic interstitial coarsening. Bibasilar densities similar to prior radiograph likely scarring. No new consolidation. There is no pleural effusion or pneumothorax. Stable cardiac silhouette. Atherosclerotic calcification of the aorta. No acute osseous pathology. Lower  thoracic compression fracture with anterior wedging similar to prior radiograph. Right upper quadrant cholecystectomy clips. IMPRESSION: 1. No acute cardiopulmonary process. 2. Chronic interstitial coarsening and bibasilar scarring similar to prior radiograph. No new consolidation. Electronically Signed   By: Anner Crete M.D.   On: 03/13/2019 19:41   CT Chest W Contrast  Result Date: 02/16/2019 CLINICAL DATA:  86 year old female with history of cholangiocarcinoma undergoing chemotherapy. Restaging examination. EXAM: CT CHEST AND ABDOMEN WITH CONTRAST TECHNIQUE: Multidetector CT imaging of the chest and abdomen was performed following the standard protocol during bolus administration of intravenous contrast. CONTRAST:  59m OMNIPAQUE IOHEXOL 300 MG/ML  SOLN COMPARISON:  CT the abdomen and pelvis 11/20/2018. FINDINGS: CT CHEST FINDINGS Cardiovascular: Heart size is mildly enlarged. There is no significant pericardial fluid, thickening or pericardial calcification. There is aortic atherosclerosis, as well as atherosclerosis of the great vessels of the mediastinum and the coronary arteries, including calcified atherosclerotic plaque in the left main, left anterior descending, left circumflex and right coronary arteries. Right internal jugular single-lumen porta cath with tip terminating in the right atrium. Mediastinum/Nodes: No pathologically enlarged mediastinal or hilar lymph nodes. Esophagus is unremarkable in appearance. No axillary lymphadenopathy. Lungs/Pleura: Patchy areas of ground-glass attenuation, septal thickening, subpleural reticulation and mild cylindrical traction bronchiectasis are noted throughout the lungs bilaterally, when compared to prior CT the abdomen and pelvis dated 01/23/2018, these findings in the visualized lung bases on the prior study appear less apparent than on today's examination, suggesting a progressive process. No acute consolidative airspace disease. Trace bilateral  pleural effusions and/or pleural thickening, new compared to the prior study. A few scattered calcified granulomas are noted in the lungs bilaterally. No other suspicious appearing pulmonary nodules or masses are noted. Concerning for interstitial lung disease. Musculoskeletal: There are no aggressive appearing lytic or blastic lesions noted in the visualized portions of the skeleton. Chronic appearing compression fracture of T12 with 30% loss of anterior vertebral body height. CT ABDOMEN FINDINGS Hepatobiliary: Multiple hypovascular lesions are again noted in the liver, very similar to the prior study from 11/20/2018. The largest of these is between segment 4A and 4B (axial image 55 of series 2) where there is a relatively well-defined centrally low-attenuation area measuring 2.9 x 2.4 cm (previously 3.3 x 2.5 cm). The other dominant lesion in segment 8 (axial image 51 of series 2) currently measures 2.0 x 1.7 cm (previously 2.1 x 1.8 cm). Several other subtle hypovascular areas appear similar to the prior examination likely to represent additional regions of involvement. No new hepatic lesions. No intrahepatic biliary ductal dilatation. Status post cholecystectomy. Common bile duct remains mildly dilated measuring 1.4 cm in the porta hepatis, similar to the prior study, likely reflective of benign post cholecystectomy physiology. Pancreas: No pancreatic mass. No pancreatic ductal dilatation. No pancreatic or peripancreatic fluid collections or inflammatory changes. Spleen: Unremarkable. Adrenals/Urinary Tract: Severe left renal atrophy again noted. Low-attenuation lesions in the right kidney, compatible with simple cysts, largest of which is in the upper pole measuring 2.9 cm in diameter. No  hydroureteronephrosis in the visualized portions of the abdomen. Bilateral adrenal glands are normal in appearance. Stomach/Bowel: Normal appearance of the stomach. No pathologic dilatation of visualized small bowel or colon.  Several colonic diverticulae are noted in the descending colon. Vascular/Lymphatic: Aortic atherosclerosis, without evidence of aneurysm or dissection in the abdominal vasculature. No lymphadenopathy noted in the abdomen. Other: Trace volume of perihepatic ascites. No pneumoperitoneum noted in the visualized portions of the peritoneal cavity. Musculoskeletal: There are no aggressive appearing lytic or blastic lesions noted in the visualized portions of the skeleton. Orthopedic fixation hardware in the lower lumbar spine and sacrum incompletely imaged. IMPRESSION: 1. Multifocal hypovascular liver lesions, grossly stable compared to the prior study from 11/20/2018. No definite signs of progressive or metastatic disease on today's examination. 2. Chronic changes of interstitial lung disease redemonstrated which appears mildly progressive compared to remote prior examinations, with a spectrum of findings considered probable usual interstitial pneumonia (UIP) per current ATS guidelines. 3. New trace bilateral pleural effusions and/or areas of pleural thickening. 4. Colonic diverticulosis. 5. Mild cardiomegaly. 6. Aortic atherosclerosis, in addition to left main and 3 vessel coronary artery disease. 7. Additional incidental findings, as above. Electronically Signed   By: Vinnie Langton M.D.   On: 02/16/2019 16:38   CT Abdomen W Contrast  Result Date: 02/16/2019 CLINICAL DATA:  86 year old female with history of cholangiocarcinoma undergoing chemotherapy. Restaging examination. EXAM: CT CHEST AND ABDOMEN WITH CONTRAST TECHNIQUE: Multidetector CT imaging of the chest and abdomen was performed following the standard protocol during bolus administration of intravenous contrast. CONTRAST:  22m OMNIPAQUE IOHEXOL 300 MG/ML  SOLN COMPARISON:  CT the abdomen and pelvis 11/20/2018. FINDINGS: CT CHEST FINDINGS Cardiovascular: Heart size is mildly enlarged. There is no significant pericardial fluid, thickening or pericardial  calcification. There is aortic atherosclerosis, as well as atherosclerosis of the great vessels of the mediastinum and the coronary arteries, including calcified atherosclerotic plaque in the left main, left anterior descending, left circumflex and right coronary arteries. Right internal jugular single-lumen porta cath with tip terminating in the right atrium. Mediastinum/Nodes: No pathologically enlarged mediastinal or hilar lymph nodes. Esophagus is unremarkable in appearance. No axillary lymphadenopathy. Lungs/Pleura: Patchy areas of ground-glass attenuation, septal thickening, subpleural reticulation and mild cylindrical traction bronchiectasis are noted throughout the lungs bilaterally, when compared to prior CT the abdomen and pelvis dated 01/23/2018, these findings in the visualized lung bases on the prior study appear less apparent than on today's examination, suggesting a progressive process. No acute consolidative airspace disease. Trace bilateral pleural effusions and/or pleural thickening, new compared to the prior study. A few scattered calcified granulomas are noted in the lungs bilaterally. No other suspicious appearing pulmonary nodules or masses are noted. Concerning for interstitial lung disease. Musculoskeletal: There are no aggressive appearing lytic or blastic lesions noted in the visualized portions of the skeleton. Chronic appearing compression fracture of T12 with 30% loss of anterior vertebral body height. CT ABDOMEN FINDINGS Hepatobiliary: Multiple hypovascular lesions are again noted in the liver, very similar to the prior study from 11/20/2018. The largest of these is between segment 4A and 4B (axial image 55 of series 2) where there is a relatively well-defined centrally low-attenuation area measuring 2.9 x 2.4 cm (previously 3.3 x 2.5 cm). The other dominant lesion in segment 8 (axial image 51 of series 2) currently measures 2.0 x 1.7 cm (previously 2.1 x 1.8 cm). Several other subtle  hypovascular areas appear similar to the prior examination likely to represent additional regions of involvement. No  new hepatic lesions. No intrahepatic biliary ductal dilatation. Status post cholecystectomy. Common bile duct remains mildly dilated measuring 1.4 cm in the porta hepatis, similar to the prior study, likely reflective of benign post cholecystectomy physiology. Pancreas: No pancreatic mass. No pancreatic ductal dilatation. No pancreatic or peripancreatic fluid collections or inflammatory changes. Spleen: Unremarkable. Adrenals/Urinary Tract: Severe left renal atrophy again noted. Low-attenuation lesions in the right kidney, compatible with simple cysts, largest of which is in the upper pole measuring 2.9 cm in diameter. No hydroureteronephrosis in the visualized portions of the abdomen. Bilateral adrenal glands are normal in appearance. Stomach/Bowel: Normal appearance of the stomach. No pathologic dilatation of visualized small bowel or colon. Several colonic diverticulae are noted in the descending colon. Vascular/Lymphatic: Aortic atherosclerosis, without evidence of aneurysm or dissection in the abdominal vasculature. No lymphadenopathy noted in the abdomen. Other: Trace volume of perihepatic ascites. No pneumoperitoneum noted in the visualized portions of the peritoneal cavity. Musculoskeletal: There are no aggressive appearing lytic or blastic lesions noted in the visualized portions of the skeleton. Orthopedic fixation hardware in the lower lumbar spine and sacrum incompletely imaged. IMPRESSION: 1. Multifocal hypovascular liver lesions, grossly stable compared to the prior study from 11/20/2018. No definite signs of progressive or metastatic disease on today's examination. 2. Chronic changes of interstitial lung disease redemonstrated which appears mildly progressive compared to remote prior examinations, with a spectrum of findings considered probable usual interstitial pneumonia (UIP) per  current ATS guidelines. 3. New trace bilateral pleural effusions and/or areas of pleural thickening. 4. Colonic diverticulosis. 5. Mild cardiomegaly. 6. Aortic atherosclerosis, in addition to left main and 3 vessel coronary artery disease. 7. Additional incidental findings, as above. Electronically Signed   By: Vinnie Langton M.D.   On: 02/16/2019 16:38    ASSESSMENT AND PLAN: 1. Anterior liver mass, ultrasound-guided biopsy 09/22/2017-adenocarcinoma, MSI-stable, tumor mutation burden 0,IDH1 R132G, BAP1 loss ? CT chest 09/07/2017, 4.1 cm hypodense anterior liver mass, changes of chronic hypersensitivity pneumonitis ? MRI abdomen 09/13/2017-isolated liver mass measuring 4.7 x 3.8 cm involving segments 4B and 5, no additional liver masses ? SBRT to the liver mass beginning 10/31/2017, completed 11/10/2017 ? CT abdomen/pelvis 01/23/2018-radiation changes in the liver, liver mass measuring 5.9 x 4.4 x 5.0 cm but no evidence of metastatic disease in the abdomen or pelvis ? MRI abdomen 02/22/2018-dominant central left liver lesion slightly increased in size with decreased central enhancement suggesting necrosis, 2 new lesions including an 11 mm left hepatic lesion and a 9 mm right hepatic lesion ? Xeloda 7 days on/7 days off beginning 03/27/2018 (1500 mg every morning and 1000 mg every afternoon) ? Xeloda dose reduced to 1000 mg every morning and 1000 mg every afternoon beginning 04/24/2018 due to diarrhea ? Xeloda discontinued after an office visit 05/15/2018 secondary to hand/foot syndrome and diarrhea ? CT abdomen/pelvis 06/12/2018-decrease in size of dominant left hepatic mass, no significant change in smaller liver lesions, stable biliary ductal dilatation, diffuse "colitis " ? CT 08/31/2018-decreased size of the segment 5 lesion, increased size of other liver lesions and new lesions, no evidence of extrahepatic metastatic disease, persistent colonic thickening ? Cycle 1 gemcitabine/Abraxane 09/12/2018 ? Cycle  2 gemcitabine/Abraxane 09/27/2018 ? Cycle 3 gemcitabine/Abraxane 10/10/2018 ? Cycle 4 gemcitabine/Abraxane 10/24/2018 ? Cycle 5 gemcitabine/Abraxane 11/07/2018 ? CT 11/20/2018, unchanged dominant segment 5 lesion, interval enlargement of local small hypodense lesions in the right liver, no evidence of distant metastatic disease, colonic wall thickening-resolved ? CTs reviewed by Dr. Makaia Rappa/radiologist, consistent with stable disease ? Cycle  6 gemcitabine/Abraxane 12/05/2018 ? Cycle 7 gemcitabine/Abraxane 12/19/2018 ? Cycle 8 gemcitabine/Abraxane 01/02/2019 ? Cycle 9 gemcitabine/Abraxane 01/16/2019 ? Cycle 10 gemcitabine/Abraxane 02/06/2019 ? CTs 02/16/2019-stable liver lesions. Chronic changes of interstitial lung disease, appears mildly progressive. New trace bilateral pleural effusions and/or areas of pleural thickening. ? Cycle 11 gemcitabine/Abraxane 02/20/2019 (schedule changedto every 3 weeks going forward) 2. Abdominal pain 3. History of diastolic heart failure 4. Hypertension 5. G3, P3 6. History of anemia 7. Right foot cellulitis 05/22/2018-Keflex prescribed 05/23/2018 C. difficile colitis 06/14/2018-treated with vancomycin, recurrent diarrhea 07/04/2018-second course of vancomycin (prolonged taper) prescribed, persistent diarrhea 08/06/2018-improved after diet change  8.  Hospital admission 03/13/2019-partial expressive aphasia and dehydration  Ms. Filter appears improved.  She still has some mild difficulty finding words.  UA indicative of possible UTI.  Urine culture is pending.  She is afebrile and asymptomatic from this.  Creatinine improved with IV fluids.  1.  Decrease IV fluids to 50 cc/h and encourage oral intake. 2.  Recheck BMET in the morning. 3.  We will obtain an MRI of the brain with and without contrast for further evaluation of her expressive aphasia. 4.  The patient was encouraged to ambulate in the hallway. 5.  We will follow-up on urine culture.  Disposition: If renal  function continues to improve and expressive aphasia continues to improve, will consider discharge on 03/15/2019.   LOS: 1 day   Mikey Bussing, DNP, AGPCNP-BC, AOCNP 03/14/19 Reviewed and examined.  She reports feeling better.  She is alert, but continues to have difficulty with word finding and does not have her normal affect.  I doubt the urinary tract infection explains the altered mental status.  The creatinine has improved.  We will proceed with a diagnostic.  I updated her son by telephone.

## 2019-03-15 ENCOUNTER — Telehealth: Payer: Self-pay | Admitting: Nurse Practitioner

## 2019-03-15 ENCOUNTER — Institutional Professional Consult (permissible substitution): Payer: Medicare Other | Admitting: Pulmonary Disease

## 2019-03-15 ENCOUNTER — Encounter: Payer: Self-pay | Admitting: *Deleted

## 2019-03-15 MED ORDER — CEPHALEXIN 250 MG PO CAPS: 250.0000 mg | ORAL_CAPSULE | Freq: Three times a day (TID) | ORAL | 0 refills | Status: DC

## 2019-03-15 MED ORDER — HEPARIN SOD (PORK) LOCK FLUSH 100 UNIT/ML IV SOLN: 500.0000 [IU] | INTRAVENOUS | Status: DC

## 2019-03-15 MED ORDER — HEPARIN SOD (PORK) LOCK FLUSH 100 UNIT/ML IV SOLN
500.0000 [IU] | INTRAVENOUS | Status: DC | PRN
  Filled 2019-03-15 (×2): qty 5

## 2019-03-15 NOTE — Progress Notes (Signed)
D/C from hospital today. Per Dr. Benay Spice, needs lab/ov and chemo on 1/11 or 1/12. High priority scheduling message sent.

## 2019-03-15 NOTE — Progress Notes (Signed)
Patient has rested well overnight.  On evening she felt that her speech is much better and she can find her words.  Denied any pain when asked.

## 2019-03-15 NOTE — Progress Notes (Signed)
Pt assessment WNL.  BP slightly elevated this am and scheduled bp meds given per MAR.  Patient ambulating with cane to bathroom with minimal assist.  Discharge instructions discussed with and provided to patient.  Transported off unit via wheelchair and being met by family for transport home.

## 2019-03-15 NOTE — Telephone Encounter (Signed)
Scheduled appt per 1/7 sch message - unable to reach pt on home number .Called cell, no answer. Left message with appt date and time  Message RN to let her know.

## 2019-03-15 NOTE — Discharge Summary (Addendum)
Discharge Summary  Patient ID: Summer Nielsen MRN: [id number] DOB/AGE: [birth year] 84 y.o.  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 [facility])   Discharged Condition: good  Discharge Labs:    CBC    Component Value Date/Time   WBC 9.6 03/13/2019 1102   WBC 9.7 09/06/2018 1414   RBC 3.21 (L) 03/13/2019 1102   HGB 10.4 (L) 03/13/2019 1102   HGB 12.1 08/08/2017 1000   HGB 11.5 (L) 02/08/2017 1354   HCT 31.6 (L) 03/13/2019 1102   HCT 35.7 08/08/2017 1000   HCT 34.8 02/08/2017 1354   PLT 476 (H) 03/13/2019 1102   PLT 319 08/08/2017 1000   MCV 98.4 03/13/2019 1102   MCV 95 08/08/2017 1000   MCV 94.6 02/08/2017 1354   MCH 32.4 03/13/2019 1102   MCHC 32.9 03/13/2019 1102   RDW 15.5 03/13/2019 1102   RDW 14.0 08/08/2017 1000   RDW 15.1 (H) 02/08/2017 1354   LYMPHSABS 0.8 03/13/2019 1102   LYMPHSABS 1.7 02/08/2017 1354   MONOABS 1.3 (H) 03/13/2019 1102   MONOABS 0.8 02/08/2017 1354   EOSABS 0.8 (H) 03/13/2019 1102   EOSABS 0.4 02/08/2017 1354   BASOSABS 0.1 03/13/2019 1102   BASOSABS 0.1 02/08/2017 1354    CMP Latest Ref Rng & Units 03/14/2019 03/13/2019 02/20/2019  Glucose 70 - 99 mg/dL 95 121(H) 122(H)  BUN 8 - 23 mg/dL 23 26(H) 22  Creatinine 0.44 - 1.00 mg/dL 1.26(H) 1.50(H) 1.24(H)  Sodium 135 - 145 mmol/L 138 139 140  Potassium 3.5 - 5.1 mmol/L 4.0 4.2 4.1  Chloride 98 - 111 mmol/L 105 102 107  CO2 22 - 32 mmol/L _0 Calcium 8.9 - 10.3 mg/dL 8.7(L) 9.6 7.5(L)  Total Protein 6.5 - 8.1 g/dL - 7.6 6.5  Total Bilirubin 0.3 - 1.2 mg/dL - 0.6 0.4  Alkaline Phos 38 - 126 U/L - 79 68  AST 15 - 41 U/L - 25 19  ALT 0 - 44 U/L - 14 14    Significant Diagnostic Studies:  MRI of the brain with and without contrast on 03/14/2019: No evidence of intracranial metastatic disease. No acute infarction. Chronic microvascular ischemic changes.  Consults: None  Procedures:  None  Disposition: Discharge disposition: 01-Home or Self Care      Allergies as of 03/15/2019       Reactions   Levofloxacin Nausea Only   Dizzy, hot, foot pain, insomnia   Percocet [oxycodone-acetaminophen] Nausea And Vomiting, Other (See Comments)   sick        Medication List     TAKE these medications    acetaminophen 500 MG tablet Commonly known as: TYLENOL Take 1,000 mg by mouth daily as needed for moderate pain or headache.   bimatoprost 0.01 % Soln Commonly known as: LUMIGAN Place 1 drop into both eyes at bedtime.   calcium-vitamin D 500-200 MG-UNIT tablet Commonly known as: OSCAL WITH D Take 1 tablet by mouth daily.   cephALEXin 250 MG capsule Commonly known as: KEFLEX Take 1 capsule (250 mg total) by mouth every 8 (eight) hours.   diphenoxylate-atropine 2.5-0.025 MG tablet Commonly known as: LOMOTIL Take 2 tablets by mouth 4 (four) times daily as needed for diarrhea or loose stools.   furosemide 40 MG tablet Commonly known as: LASIX Take 40-60 mg by mouth See admin instructions. Take 60 mg in the AM and 40 mg after lunch   Fusion Plus Caps Take 1  capsule by mouth daily.   gabapentin 300 MG capsule Commonly known as: NEURONTIN TAKE 1 CAPSULE BY MOUTH THREE TIMES A DAY What changed: See the new instructions.   HYDROmorphone 2 MG tablet Commonly known as: Dilaudid Take 0.5-1 tablets (1-2 mg total) by mouth every 4 (four) hours as needed for severe pain.   ibuprofen 200 MG tablet Commonly known as: ADVIL Take 200 mg by mouth every 6 (six) hours as needed for moderate pain (back surgery).   Klor-Con M10 10 MEQ tablet Generic drug: potassium chloride TAKE 2 TABLETS EVERY DAY What changed: how much to take   levothyroxine 150 MCG tablet Commonly known as: SYNTHROID Take 150 mcg by mouth daily.   lidocaine-prilocaine cream Commonly known as: EMLA Apply 1 application topically as directed.   loperamide 2 MG capsule Commonly known as:  IMODIUM Take 2 capsules by mouth 4 (four) times daily as needed.   losartan 50 MG tablet Commonly known as: COZAAR Take 50 mg by mouth daily.   omeprazole 40 MG capsule Commonly known as: PRILOSEC Take 40 mg by mouth daily before breakfast.   prochlorperazine 5 MG tablet Commonly known as: COMPAZINE Take 1-2 tablets (5-10 mg total) by mouth every 6 (six) hours as needed for nausea or vomiting.   propranolol 20 MG tablet Commonly known as: INDERAL Take 20 mg by mouth 2 (two) times daily.         HPI: This Summer Nielsen is an 86 year old female with metastatic cholangiocarcinoma who presented to the cancer center on 03/13/2019 and was noted to have altered mental status and partial expressive aphasia with difficulty word finding.  She was admitted for further evaluation.  Hospital Course: The patient was admitted to the oncology unit and started on IV fluids consisting of normal saline at 100 cc/h due to dehydration.  UA showed trace leukocytes which was concerning for possible UTI.  She was started on cephalexin 250 mg every 8 hours.  Urine culture was sent which showed no growth.  Her altered mental status and expressive aphasia had improved by the next morning.  Her renal function had also improved.  Since she was eating and drinking better, her IV fluids were decreased to 50 cc/h.  She had an MRI of the brain on 03/14/2019 which did not show any evidence of acute ischemia or brain metastases.  On the morning of 03/15/2019, she continued to improve and was felt to be stable for discharge.  The patient will discharge to home on the same home medications and will take cephalexin 250 mg every 8 hours for 3 additional days.  Discharge Instructions     Diet general   Complete by: As directed    Increase activity slowly   Complete by: As directed       We will arrange for outpatient follow-up at the cancer center.  She will contact her with the date and time.   Signed: Mikey Bussing 03/15/2019,  10:15 AM  Summer Nielsen was interviewed and examined on the day of discharge.  Her neurologic status has improved over the past few days.  The etiology of the confusion and word finding difficulty remains unclear.  She will have close outpatient follow-up.  She will complete a course of cephalexin and resume her outpatient medications.

## 2019-03-15 NOTE — Plan of Care (Signed)

## 2019-03-16 ENCOUNTER — Telehealth: Payer: Self-pay | Admitting: *Deleted

## 2019-03-16 ENCOUNTER — Other Ambulatory Visit: Payer: Self-pay | Admitting: *Deleted

## 2019-03-16 DIAGNOSIS — C221 Intrahepatic bile duct carcinoma: Secondary | ICD-10-CM

## 2019-03-16 NOTE — Telephone Encounter (Signed)
Husband asking what time her appointment on Monday is and what will occur. Notified of arrival time and reason for visit.

## 2019-03-19 ENCOUNTER — Encounter: Payer: Self-pay | Admitting: Nurse Practitioner

## 2019-03-19 ENCOUNTER — Inpatient Hospital Stay: Payer: Medicare PPO

## 2019-03-19 ENCOUNTER — Other Ambulatory Visit: Payer: Self-pay

## 2019-03-19 ENCOUNTER — Inpatient Hospital Stay: Payer: Medicare PPO | Admitting: Nurse Practitioner

## 2019-03-19 VITALS — BP 150/60 | HR 63 | Temp 98.2°F | Resp 16 | Ht 62.0 in | Wt 131.9 lb

## 2019-03-19 DIAGNOSIS — C221 Intrahepatic bile duct carcinoma: Secondary | ICD-10-CM | POA: Diagnosis present

## 2019-03-19 DIAGNOSIS — R0609 Other forms of dyspnea: Secondary | ICD-10-CM | POA: Diagnosis not present

## 2019-03-19 DIAGNOSIS — Z5111 Encounter for antineoplastic chemotherapy: Secondary | ICD-10-CM | POA: Diagnosis present

## 2019-03-19 DIAGNOSIS — I110 Hypertensive heart disease with heart failure: Secondary | ICD-10-CM | POA: Diagnosis not present

## 2019-03-19 DIAGNOSIS — Z95828 Presence of other vascular implants and grafts: Secondary | ICD-10-CM

## 2019-03-19 DIAGNOSIS — R4182 Altered mental status, unspecified: Secondary | ICD-10-CM | POA: Diagnosis not present

## 2019-03-19 DIAGNOSIS — I5032 Chronic diastolic (congestive) heart failure: Secondary | ICD-10-CM | POA: Diagnosis not present

## 2019-03-19 LAB — CBC WITH DIFFERENTIAL (CANCER CENTER ONLY)
Abs Immature Granulocytes: 0.03 10*3/uL (ref 0.00–0.07)
Basophils Absolute: 0.1 10*3/uL (ref 0.0–0.1)
Basophils Relative: 1 %
Eosinophils Absolute: 0.6 10*3/uL — ABNORMAL HIGH (ref 0.0–0.5)
Eosinophils Relative: 6 %
HCT: 27.9 % — ABNORMAL LOW (ref 36.0–46.0)
Hemoglobin: 9 g/dL — ABNORMAL LOW (ref 12.0–15.0)
Immature Granulocytes: 0 %
Lymphocytes Relative: 7 %
Lymphs Abs: 0.7 10*3/uL (ref 0.7–4.0)
MCH: 32.1 pg (ref 26.0–34.0)
MCHC: 32.3 g/dL (ref 30.0–36.0)
MCV: 99.6 fL (ref 80.0–100.0)
Monocytes Absolute: 1.3 10*3/uL — ABNORMAL HIGH (ref 0.1–1.0)
Monocytes Relative: 13 %
Neutro Abs: 7.7 10*3/uL (ref 1.7–7.7)
Neutrophils Relative %: 73 %
Platelet Count: 343 10*3/uL (ref 150–400)
RBC: 2.8 MIL/uL — ABNORMAL LOW (ref 3.87–5.11)
RDW: 15.5 % (ref 11.5–15.5)
WBC Count: 10.4 10*3/uL (ref 4.0–10.5)
nRBC: 0 % (ref 0.0–0.2)

## 2019-03-19 LAB — CMP (CANCER CENTER ONLY)
ALT: 16 U/L (ref 0–44)
AST: 23 U/L (ref 15–41)
Albumin: 3 g/dL — ABNORMAL LOW (ref 3.5–5.0)
Alkaline Phosphatase: 72 U/L (ref 38–126)
Anion gap: 10 (ref 5–15)
BUN: 27 mg/dL — ABNORMAL HIGH (ref 8–23)
CO2: 20 mmol/L — ABNORMAL LOW (ref 22–32)
Calcium: 7.9 mg/dL — ABNORMAL LOW (ref 8.9–10.3)
Chloride: 107 mmol/L (ref 98–111)
Creatinine: 1.37 mg/dL — ABNORMAL HIGH (ref 0.44–1.00)
GFR, Est AFR Am: 40 mL/min — ABNORMAL LOW (ref >60)
GFR, Estimated: 34 mL/min — ABNORMAL LOW (ref >60)
Glucose, Bld: 125 mg/dL — ABNORMAL HIGH (ref 70–99)
Potassium: 4 mmol/L (ref 3.5–5.1)
Sodium: 137 mmol/L (ref 135–145)
Total Bilirubin: 0.5 mg/dL (ref 0.3–1.2)
Total Protein: 6.6 g/dL (ref 6.5–8.1)

## 2019-03-19 MED ORDER — SODIUM CHLORIDE 0.9% FLUSH
10.0000 mL | INTRAVENOUS | Status: DC | PRN
  Filled 2019-03-19: qty 10

## 2019-03-19 MED ORDER — PACLITAXEL PROTEIN-BOUND CHEMO INJECTION 100 MG
100.0000 mg/m2 | Freq: Once | INTRAVENOUS | Status: AC
  Administered 2019-03-19: 175 mg via INTRAVENOUS
  Filled 2019-03-19: qty 35

## 2019-03-19 MED ORDER — SODIUM CHLORIDE 0.9 % IV SOLN
800.0000 mg/m2 | Freq: Once | INTRAVENOUS | Status: AC
  Administered 2019-03-19: 1330 mg via INTRAVENOUS
  Filled 2019-03-19: qty 34.98

## 2019-03-19 MED ORDER — HEPARIN SOD (PORK) LOCK FLUSH 100 UNIT/ML IV SOLN
500.0000 [IU] | Freq: Once | INTRAVENOUS | Status: AC | PRN
  Administered 2019-03-19: 500 [IU]
  Filled 2019-03-19: qty 5

## 2019-03-19 MED ORDER — PROCHLORPERAZINE MALEATE 10 MG PO TABS
ORAL_TABLET | ORAL | Status: AC
  Filled 2019-03-19: qty 1

## 2019-03-19 MED ORDER — SODIUM CHLORIDE 0.9 % IV SOLN
Freq: Once | INTRAVENOUS | Status: AC
  Filled 2019-03-19: qty 250

## 2019-03-19 MED ORDER — SODIUM CHLORIDE 0.9% FLUSH
10.0000 mL | INTRAVENOUS | Status: DC | PRN
  Administered 2019-03-19: 10 mL
  Filled 2019-03-19: qty 10

## 2019-03-19 MED ORDER — PROCHLORPERAZINE MALEATE 10 MG PO TABS
5.0000 mg | ORAL_TABLET | Freq: Once | ORAL | Status: AC
  Administered 2019-03-19: 5 mg via ORAL

## 2019-03-19 NOTE — Progress Notes (Addendum)
[place] OFFICE PROGRESS NOTE   Diagnosis: Cholangiocarcinoma  INTERVAL HISTORY:   Ms. Perezgarcia returns as scheduled.  She completed a cycle of gemcitabine/Abraxane on 02/20/2019.  Treatment was held on 03/12/2018 due to mental status change.  She was hospitalized.  Brain MRI was negative.  Mental status improved and she was discharged home 03/15/2019.  Overall she feels well.  She reports good intake.  She denies confusion.  No nausea or vomiting.  No diarrhea.  No fever or rash.  She is having less dyspnea on exertion.  Objective:  Vital signs in last 24 hours:  Blood pressure (!) 150/60, pulse 63, temperature 98.2 F (36.8 C), temperature source Temporal, resp. rate 16, height _0  (1.575 m), weight 131 lb 14.4 oz (59.8 kg), SpO2 92 %.    HEENT: No thrush or ulcers. GI: Abdomen soft and nontender.  No hepatomegaly. Vascular: Trace edema at the lower legs bilaterally. Neuro: Alert and oriented.  Follows commands.  Speech is fluent. Skin: No rash. Port-A-Cath without erythema.   Lab Results:  Lab Results  Component Value Date   WBC 10.4 03/19/2019   HGB 9.0 (L) 03/19/2019   HCT 27.9 (L) 03/19/2019   MCV 99.6 03/19/2019   PLT 343 03/19/2019   NEUTROABS 7.7 03/19/2019    Imaging:  No results found.  Medications: I have reviewed the patient's current medications.  Assessment/Plan: 1. Anterior liver mass, ultrasound-guided biopsy 09/22/2017-adenocarcinoma, MSI-stable, tumor mutation burden 0,IDH1 R132G, BAP1 loss ? CT chest 09/07/2017, 4.1 cm hypodense anterior liver mass, changes of chronic hypersensitivity pneumonitis ? MRI abdomen 09/13/2017-isolated liver mass measuring 4.7 x 3.8 cm involving segments 4B and 5, no additional liver masses ? SBRT to the liver mass beginning 10/31/2017, completed 11/10/2017 ? CT abdomen/pelvis 01/23/2018-radiation changes in the liver, liver mass measuring 5.9 x 4.4 x 5.0 cm but no evidence of metastatic disease in the abdomen  or pelvis ? MRI abdomen 02/22/2018-dominant central left liver lesion slightly increased in size with decreased central enhancement suggesting necrosis, 2 new lesions including an 11 mm left hepatic lesion and a 9 mm right hepatic lesion ? Xeloda 7 days on/7 days off beginning 03/27/2018 (1500 mg every morning and 1000 mg every afternoon) ? Xeloda dose reduced to 1000 mg every morning and 1000 mg every afternoon beginning 04/24/2018 due to diarrhea ? Xeloda discontinued after an office visit 05/15/2018 secondary to hand/foot syndrome and diarrhea ? CT abdomen/pelvis 06/12/2018-decrease in size of dominant left hepatic mass, no significant change in smaller liver lesions, stable biliary ductal dilatation, diffuse "colitis " ? CT 08/31/2018-decreased size of the segment 5 lesion, increased size of other liver lesions and new lesions, no evidence of extrahepatic metastatic disease, persistent colonic thickening ? Cycle 1 gemcitabine/Abraxane 09/12/2018 ? Cycle 2 gemcitabine/Abraxane 09/27/2018 ? Cycle 3 gemcitabine/Abraxane 10/10/2018 ? Cycle 4 gemcitabine/Abraxane 10/24/2018 ? Cycle 5 gemcitabine/Abraxane 11/07/2018 ? CT 11/20/2018, unchanged dominant segment 5 lesion, interval enlargement of local small hypodense lesions in the right liver, no evidence of distant metastatic disease, colonic wall thickening-resolved ? CTs reviewed by Dr. Sherrill/radiologist, consistent with stable disease ? Cycle 6 gemcitabine/Abraxane 12/05/2018 ? Cycle 7 gemcitabine/Abraxane 12/19/2018 ? Cycle 8 gemcitabine/Abraxane 01/02/2019 ? Cycle 9 gemcitabine/Abraxane 01/16/2019 ? Cycle 10 gemcitabine/Abraxane 02/06/2019 ? CTs 02/16/2019-stable liver lesions. Chronic changes of interstitial lung disease, appears mildly progressive. New trace bilateral pleural effusions and/or areas of pleural thickening. ? Cycle 11 gemcitabine/Abraxane 02/20/2019 (schedule changedto every 3 weeks going forward) ? Cycle 12 gemcitabine/Abraxane  03/19/2019 2. Abdominal pain  3. History of diastolic heart failure 4. Hypertension 5. G3, P3 6. History of anemia 7. Right foot cellulitis 05/22/2018-Keflex prescribed 05/23/2018 C. difficile colitis 06/14/2018-treated with vancomycin, recurrent diarrhea 07/04/2018-second course of vancomycin (prolonged taper) prescribed, persistent diarrhea 08/06/2018-improved after diet change  8.  Hospital admission 03/13/2019-partial expressive aphasia and dehydration; negative MRI of the brain 03/14/2019.  Mental status improved.   Disposition: Ms. Riepe appears stable.  Mental status continues to be improved.  Plan to resume chemotherapy today with cycle 12 gemcitabine/Abraxane.  We reviewed the CBC from today.  Counts adequate to proceed with treatment.  She will return for lab, follow-up, gemcitabine/Abraxane in 3 weeks.  She will contact the office in the interim with any problems.  Contacted son Elta Guadeloupe by phone to review the above.  Patient seen with Dr. Benay Spice.    Ned Card ANP/GNP-BC   03/19/2019  1:56 PM  This was a shared visit with Ned Card.  Ms. Mira has experienced significant improvement in her word finding ability and confusion since admission to the hospital last week.  She would like to resume systemic therapy for the cholangiocarcinoma.  Julieanne Manson, MD

## 2019-03-19 NOTE — Patient Instructions (Signed)
[facility] Discharge Instructions for Patients Receiving Chemotherapy  Today you received the following chemotherapy agents: Abraxane/Gemzar.  To help prevent nausea and vomiting after your treatment, we encourage you to take your nausea medication as directed.   If you develop nausea and vomiting that is not controlled by your nausea medication, call the clinic.   BELOW ARE SYMPTOMS THAT SHOULD BE REPORTED IMMEDIATELY:  *FEVER GREATER THAN 100.5 F  *CHILLS WITH OR WITHOUT FEVER  NAUSEA AND VOMITING THAT IS NOT CONTROLLED WITH YOUR NAUSEA MEDICATION  *UNUSUAL SHORTNESS OF BREATH  *UNUSUAL BRUISING OR BLEEDING  TENDERNESS IN MOUTH AND THROAT WITH OR WITHOUT PRESENCE OF ULCERS  *URINARY PROBLEMS  *BOWEL PROBLEMS  UNUSUAL RASH Items with * indicate a potential emergency and should be followed up as soon as possible.  Feel free to call the clinic should you have any questions or concerns. The clinic phone number is [phone].  Please show the [facility] at check-in to the Emergency Department and triage nurse.

## 2019-04-08 ENCOUNTER — Other Ambulatory Visit: Payer: Self-pay | Admitting: Oncology

## 2019-04-11 ENCOUNTER — Inpatient Hospital Stay: Payer: Medicare PPO

## 2019-04-11 ENCOUNTER — Other Ambulatory Visit: Payer: Self-pay

## 2019-04-11 ENCOUNTER — Inpatient Hospital Stay: Payer: Medicare PPO | Attending: Oncology | Admitting: Oncology

## 2019-04-11 VITALS — BP 188/69 | HR 67 | Temp 97.8°F | Resp 17 | Ht 62.0 in | Wt 130.1 lb

## 2019-04-11 DIAGNOSIS — C221 Intrahepatic bile duct carcinoma: Secondary | ICD-10-CM

## 2019-04-11 DIAGNOSIS — Z5111 Encounter for antineoplastic chemotherapy: Secondary | ICD-10-CM | POA: Insufficient documentation

## 2019-04-11 DIAGNOSIS — I5032 Chronic diastolic (congestive) heart failure: Secondary | ICD-10-CM | POA: Insufficient documentation

## 2019-04-11 DIAGNOSIS — Z923 Personal history of irradiation: Secondary | ICD-10-CM | POA: Insufficient documentation

## 2019-04-11 DIAGNOSIS — J849 Interstitial pulmonary disease, unspecified: Secondary | ICD-10-CM | POA: Diagnosis not present

## 2019-04-11 DIAGNOSIS — R0609 Other forms of dyspnea: Secondary | ICD-10-CM | POA: Insufficient documentation

## 2019-04-11 DIAGNOSIS — I110 Hypertensive heart disease with heart failure: Secondary | ICD-10-CM | POA: Diagnosis not present

## 2019-04-11 DIAGNOSIS — Z79899 Other long term (current) drug therapy: Secondary | ICD-10-CM | POA: Insufficient documentation

## 2019-04-11 DIAGNOSIS — Z9221 Personal history of antineoplastic chemotherapy: Secondary | ICD-10-CM | POA: Insufficient documentation

## 2019-04-11 DIAGNOSIS — Z95828 Presence of other vascular implants and grafts: Secondary | ICD-10-CM

## 2019-04-11 LAB — CMP (CANCER CENTER ONLY)
ALT: 15 U/L (ref 0–44)
AST: 26 U/L (ref 15–41)
Albumin: 2.9 g/dL — ABNORMAL LOW (ref 3.5–5.0)
Alkaline Phosphatase: 111 U/L (ref 38–126)
Anion gap: 10 (ref 5–15)
BUN: 24 mg/dL — ABNORMAL HIGH (ref 8–23)
CO2: 23 mmol/L (ref 22–32)
Calcium: 8.1 mg/dL — ABNORMAL LOW (ref 8.9–10.3)
Chloride: 105 mmol/L (ref 98–111)
Creatinine: 1.35 mg/dL — ABNORMAL HIGH (ref 0.44–1.00)
GFR, Est AFR Am: 40 mL/min — ABNORMAL LOW (ref >60)
GFR, Estimated: 35 mL/min — ABNORMAL LOW (ref >60)
Glucose, Bld: 128 mg/dL — ABNORMAL HIGH (ref 70–99)
Potassium: 4 mmol/L (ref 3.5–5.1)
Sodium: 138 mmol/L (ref 135–145)
Total Bilirubin: 0.4 mg/dL (ref 0.3–1.2)
Total Protein: 6.4 g/dL — ABNORMAL LOW (ref 6.5–8.1)

## 2019-04-11 LAB — CBC WITH DIFFERENTIAL (CANCER CENTER ONLY)
Abs Immature Granulocytes: 0.03 10*3/uL (ref 0.00–0.07)
Basophils Absolute: 0.1 10*3/uL (ref 0.0–0.1)
Basophils Relative: 1 %
Eosinophils Absolute: 1.2 10*3/uL — ABNORMAL HIGH (ref 0.0–0.5)
Eosinophils Relative: 14 %
HCT: 30 % — ABNORMAL LOW (ref 36.0–46.0)
Hemoglobin: 9.4 g/dL — ABNORMAL LOW (ref 12.0–15.0)
Immature Granulocytes: 0 %
Lymphocytes Relative: 12 %
Lymphs Abs: 1 10*3/uL (ref 0.7–4.0)
MCH: 31 pg (ref 26.0–34.0)
MCHC: 31.3 g/dL (ref 30.0–36.0)
MCV: 99 fL (ref 80.0–100.0)
Monocytes Absolute: 1.1 10*3/uL — ABNORMAL HIGH (ref 0.1–1.0)
Monocytes Relative: 13 %
Neutro Abs: 4.9 10*3/uL (ref 1.7–7.7)
Neutrophils Relative %: 60 %
Platelet Count: 465 10*3/uL — ABNORMAL HIGH (ref 150–400)
RBC: 3.03 MIL/uL — ABNORMAL LOW (ref 3.87–5.11)
RDW: 16.9 % — ABNORMAL HIGH (ref 11.5–15.5)
WBC Count: 8.3 10*3/uL (ref 4.0–10.5)
nRBC: 0 % (ref 0.0–0.2)

## 2019-04-11 MED ORDER — SODIUM CHLORIDE 0.9 % IV SOLN
Freq: Once | INTRAVENOUS | Status: AC
  Filled 2019-04-11: qty 250

## 2019-04-11 MED ORDER — HEPARIN SOD (PORK) LOCK FLUSH 100 UNIT/ML IV SOLN
500.0000 [IU] | Freq: Once | INTRAVENOUS | Status: AC | PRN
  Administered 2019-04-11: 500 [IU]
  Filled 2019-04-11: qty 5

## 2019-04-11 MED ORDER — SODIUM CHLORIDE 0.9% FLUSH
10.0000 mL | INTRAVENOUS | Status: DC | PRN
  Administered 2019-04-11: 09:00:00 10 mL
  Filled 2019-04-11: qty 10

## 2019-04-11 MED ORDER — SODIUM CHLORIDE 0.9% FLUSH
10.0000 mL | INTRAVENOUS | Status: DC | PRN
  Administered 2019-04-11: 10 mL
  Filled 2019-04-11: qty 10

## 2019-04-11 MED ORDER — SODIUM CHLORIDE 0.9 % IV SOLN
800.0000 mg/m2 | Freq: Once | INTRAVENOUS | Status: AC
  Administered 2019-04-11: 1330 mg via INTRAVENOUS
  Filled 2019-04-11: qty 34.98

## 2019-04-11 MED ORDER — PROCHLORPERAZINE MALEATE 10 MG PO TABS
ORAL_TABLET | ORAL | Status: AC
  Filled 2019-04-11: qty 1

## 2019-04-11 MED ORDER — PROCHLORPERAZINE MALEATE 10 MG PO TABS
5.0000 mg | ORAL_TABLET | Freq: Once | ORAL | Status: AC
  Administered 2019-04-11: 5 mg via ORAL

## 2019-04-11 MED ORDER — PACLITAXEL PROTEIN-BOUND CHEMO INJECTION 100 MG
100.0000 mg/m2 | Freq: Once | INTRAVENOUS | Status: AC
  Administered 2019-04-11: 175 mg via INTRAVENOUS
  Filled 2019-04-11: qty 35

## 2019-04-11 NOTE — Patient Instructions (Signed)

## 2019-04-11 NOTE — Progress Notes (Deleted)
[place] OFFICE PROGRESS NOTE   Diagnosis:   INTERVAL HISTORY:   ***  Objective:  Vital signs in last 24 hours:  Blood pressure (!) 188/69, pulse 67, temperature 97.8 F (36.6 C), temperature source Temporal, resp. rate 17, height _0  (1.575 m), weight 130 lb 1.6 oz (59 kg), SpO2 95 %.    HEENT: *** Lymphatics: *** Resp: *** Cardio: *** GI: *** Vascular: *** Neuro:***  Skin:***   Portacath/PICC-without erythema  Lab Results:  Lab Results  Component Value Date   WBC 8.3 04/11/2019   HGB 9.4 (L) 04/11/2019   HCT 30.0 (L) 04/11/2019   MCV 99.0 04/11/2019   PLT 465 (H) 04/11/2019   NEUTROABS 4.9 04/11/2019    CMP  Lab Results  Component Value Date   NA 138 04/11/2019   K 4.0 04/11/2019   CL 105 04/11/2019   CO2 23 04/11/2019   GLUCOSE 128 (H) 04/11/2019   BUN 24 (H) 04/11/2019   CREATININE 1.35 (H) 04/11/2019   CALCIUM 8.1 (L) 04/11/2019   PROT 6.4 (L) 04/11/2019   ALBUMIN 2.9 (L) 04/11/2019   AST 26 04/11/2019   ALT 15 04/11/2019   ALKPHOS 111 04/11/2019   BILITOT 0.4 04/11/2019   GFRNONAA 35 (L) 04/11/2019   GFRAA 40 (L) 04/11/2019    Lab Results  Component Value Date   CEA1 1.96 03/21/2018    Lab Results  Component Value Date   INR 1.1 09/06/2018    Imaging:  No results found.  Medications: I have reviewed the patient's current medications.   Assessment/Plan: 1. Anterior liver mass, ultrasound-guided biopsy 09/22/2017-adenocarcinoma, MSI-stable, tumor mutation burden 0,IDH1 R132G, BAP1 loss ? CT chest 09/07/2017, 4.1 cm hypodense anterior liver mass, changes of chronic hypersensitivity pneumonitis ? MRI abdomen 09/13/2017-isolated liver mass measuring 4.7 x 3.8 cm involving segments 4B and 5, no additional liver masses ? SBRT to the liver mass beginning 10/31/2017, completed 11/10/2017 ? CT abdomen/pelvis 01/23/2018-radiation changes in the liver, liver mass measuring 5.9 x 4.4 x 5.0 cm but no evidence of metastatic  disease in the abdomen or pelvis ? MRI abdomen 02/22/2018-dominant central left liver lesion slightly increased in size with decreased central enhancement suggesting necrosis, 2 new lesions including an 11 mm left hepatic lesion and a 9 mm right hepatic lesion ? Xeloda 7 days on/7 days off beginning 03/27/2018 (1500 mg every morning and 1000 mg every afternoon) ? Xeloda dose reduced to 1000 mg every morning and 1000 mg every afternoon beginning 04/24/2018 due to diarrhea ? Xeloda discontinued after an office visit 05/15/2018 secondary to hand/foot syndrome and diarrhea ? CT abdomen/pelvis 06/12/2018-decrease in size of dominant left hepatic mass, no significant change in smaller liver lesions, stable biliary ductal dilatation, diffuse "colitis " ? CT 08/31/2018-decreased size of the segment 5 lesion, increased size of other liver lesions and new lesions, no evidence of extrahepatic metastatic disease, persistent colonic thickening ? Cycle 1 gemcitabine/Abraxane 09/12/2018 ? Cycle 2 gemcitabine/Abraxane 09/27/2018 ? Cycle 3 gemcitabine/Abraxane 10/10/2018 ? Cycle 4 gemcitabine/Abraxane 10/24/2018 ? Cycle 5 gemcitabine/Abraxane 11/07/2018 ? CT 11/20/2018, unchanged dominant segment 5 lesion, interval enlargement of local small hypodense lesions in the right liver, no evidence of distant metastatic disease, colonic wall thickening-resolved ? CTs reviewed by Dr. Trixie Maclaren/radiologist, consistent with stable disease ? Cycle 6 gemcitabine/Abraxane 12/05/2018 ? Cycle 7 gemcitabine/Abraxane 12/19/2018 ? Cycle 8 gemcitabine/Abraxane 01/02/2019 ? Cycle 9 gemcitabine/Abraxane 01/16/2019 ? Cycle 10 gemcitabine/Abraxane 02/06/2019 ? CTs 02/16/2019-stable liver lesions. Chronic changes of interstitial lung disease, appears mildly progressive.  New trace bilateral pleural effusions and/or areas of pleural thickening. ? Cycle 11 gemcitabine/Abraxane 02/20/2019 (schedule changedto every 3 weeks going forward) ? Cycle 12  gemcitabine/Abraxane 03/19/2019 2. Abdominal pain 3. History of diastolic heart failure 4. Hypertension 5. G3, P3 6. History of anemia 7. Right foot cellulitis 05/22/2018-Keflex prescribed 05/23/2018 C. difficile colitis 06/14/2018-treated with vancomycin, recurrent diarrhea 07/04/2018-second course of vancomycin (prolonged taper) prescribed, persistent diarrhea 08/06/2018-improved after diet change  8.  Hospital admission 03/13/2019-partial expressive aphasia and dehydration; negative MRI of the brain 03/14/2019.  Mental status improved.     Disposition: ***  Betsy Coder, MD  04/11/2019  9:37 AM

## 2019-04-11 NOTE — Patient Instructions (Signed)
[facility] Discharge Instructions for Patients Receiving Chemotherapy  Today you received the following chemotherapy agents: Paclitaxel protein-bound (Abraxane) and Gemcitabine (Gemzar)  To help prevent nausea and vomiting after your treatment, we encourage you to take your nausea medication as directed.   If you develop nausea and vomiting that is not controlled by your nausea medication, call the clinic.   BELOW ARE SYMPTOMS THAT SHOULD BE REPORTED IMMEDIATELY:  *FEVER GREATER THAN 100.5 F  *CHILLS WITH OR WITHOUT FEVER  NAUSEA AND VOMITING THAT IS NOT CONTROLLED WITH YOUR NAUSEA MEDICATION  *UNUSUAL SHORTNESS OF BREATH  *UNUSUAL BRUISING OR BLEEDING  TENDERNESS IN MOUTH AND THROAT WITH OR WITHOUT PRESENCE OF ULCERS  *URINARY PROBLEMS  *BOWEL PROBLEMS  UNUSUAL RASH Items with * indicate a potential emergency and should be followed up as soon as possible.  Feel free to call the clinic should you have any questions or concerns. The clinic phone number is [phone].  Please show the CHEMO ALERT CARD at check-in to the Emergency Department and triage nurse.  Coronavirus (COVID-19) Are you at risk?  Are you at risk for the Coronavirus (COVID-19)?  To be considered HIGH RISK for Coronavirus (COVID-19), you have to meet the following criteria:  . Traveled to [place], [place], [place], [place] or [place]; or in the [place] to [place], [place], [place], or [place]; and have fever, cough, and shortness of breath within the last 2 weeks of travel OR . Been in close contact with a person diagnosed with COVID-19 within the last 2 weeks and have fever, cough, and shortness of breath . IF YOU DO NOT MEET THESE CRITERIA, YOU ARE CONSIDERED LOW RISK FOR COVID-19.  What to do if you are HIGH RISK for COVID-19?  . If you are having a medical emergency, call 911. . Seek medical care right away. Before you go to a doctor's office, urgent care or emergency  department, call ahead and tell them about your recent travel, contact with someone diagnosed with COVID-19, and your symptoms. You should receive instructions from your physician's office regarding next steps of care.  . When you arrive at healthcare provider, tell the healthcare staff immediately you have returned from visiting [place], [place], [place], [place] or [place]; or traveled in the [place] to [place], [place], [place], or [place]; in the last two weeks or you have been in close contact with a person diagnosed with COVID-19 in the last 2 weeks.   . Tell the health care staff about your symptoms: fever, cough and shortness of breath. . After you have been seen by a medical provider, you will be either: o Tested for (COVID-19) and discharged home on quarantine except to seek medical care if symptoms worsen, and asked to  - Stay home and avoid contact with others until you get your results (4-5 days)  - Avoid travel on public transportation if possible (such as bus, train, or airplane) or o Sent to the Emergency Department by EMS for evaluation, COVID-19 testing, and possible admission depending on your condition and test results.  What to do if you are LOW RISK for COVID-19?  Reduce your risk of any infection by using the same precautions used for avoiding the common cold or flu:  . Wash your hands often with soap and warm water for at least 20 seconds.  If soap and water are not readily available, use an alcohol-based hand sanitizer with at least 60% alcohol.  .   If coughing or sneezing, cover your mouth and nose by coughing or sneezing into the elbow areas of your shirt or coat, into a tissue or into your sleeve (not your hands). . Avoid shaking hands with others and consider head nods or verbal greetings only. . Avoid touching your eyes, nose, or mouth with unwashed hands.  . Avoid close contact with people who are sick. . Avoid places or events with large numbers of people  in one location, like concerts or sporting events. . Carefully consider travel plans you have or are making. . If you are planning any travel outside or inside the [place], visit the CDC's Travelers' Health webpage for the latest health notices. . If you have some symptoms but not all symptoms, continue to monitor at home and seek medical attention if your symptoms worsen. . If you are having a medical emergency, call 911.   ADDITIONAL HEALTHCARE OPTIONS FOR PATIENTS  [facility] / e-Visit: [email or web address]         [facility]: [phone]  [facility]: [phone]                   [facility]: [phone]   

## 2019-04-11 NOTE — Progress Notes (Signed)
[place] OFFICE PROGRESS NOTE   Diagnosis: Cholangiocarcinoma  INTERVAL HISTORY:   [name] returns as scheduled.  She completed another treatment with gemcitabine/Abraxane on 03/19/2019.  No fever, rash, or neuropathy symptoms.  No pain.  She has increased exertional dyspnea.  No fever.  She has had intermittent cough.  She is scheduled to see pulmonary later this month.  Objective:  Vital signs in last 24 hours:  Blood pressure (!) 188/69, pulse 67, temperature 97.8 F (36.6 C), temperature source Temporal, resp. rate 17, height _0  (1.575 m), weight 130 lb 1.6 oz (59 kg), SpO2 95 %.    Resp: Scattered end inspiratory rales, most prominent at the lower posterior chest bilaterally Cardio: Regular rate and rhythm GI: No hepatomegaly, nontender Vascular: No leg edema  Portacath/PICC-without erythema  Lab Results:  Lab Results  Component Value Date   WBC 8.3 04/11/2019   HGB 9.4 (L) 04/11/2019   HCT 30.0 (L) 04/11/2019   MCV 99.0 04/11/2019   PLT 465 (H) 04/11/2019   NEUTROABS 4.9 04/11/2019    CMP  Lab Results  Component Value Date   NA 138 04/11/2019   K 4.0 04/11/2019   CL 105 04/11/2019   CO2 23 04/11/2019   GLUCOSE 128 (H) 04/11/2019   BUN 24 (H) 04/11/2019   CREATININE 1.35 (H) 04/11/2019   CALCIUM 8.1 (L) 04/11/2019   PROT 6.4 (L) 04/11/2019   ALBUMIN 2.9 (L) 04/11/2019   AST 26 04/11/2019   ALT 15 04/11/2019   ALKPHOS 111 04/11/2019   BILITOT 0.4 04/11/2019   GFRNONAA 35 (L) 04/11/2019   GFRAA 40 (L) 04/11/2019    Lab Results  Component Value Date   CEA1 1.96 03/21/2018    Medications: I have reviewed the patient's current medications.   Assessment/Plan: 1. Anterior liver mass, ultrasound-guided biopsy 09/22/2017-adenocarcinoma, MSI-stable, tumor mutation burden 0,IDH1 R132G, BAP1 loss ? CT chest 09/07/2017, 4.1 cm hypodense anterior liver mass, changes of chronic hypersensitivity pneumonitis ? MRI abdomen  09/13/2017-isolated liver mass measuring 4.7 x 3.8 cm involving segments 4B and 5, no additional liver masses ? SBRT to the liver mass beginning 10/31/2017, completed 11/10/2017 ? CT abdomen/pelvis 01/23/2018-radiation changes in the liver, liver mass measuring 5.9 x 4.4 x 5.0 cm but no evidence of metastatic disease in the abdomen or pelvis ? MRI abdomen 02/22/2018-dominant central left liver lesion slightly increased in size with decreased central enhancement suggesting necrosis, 2 new lesions including an 11 mm left hepatic lesion and a 9 mm right hepatic lesion ? Xeloda 7 days on/7 days off beginning 03/27/2018 (1500 mg every morning and 1000 mg every afternoon) ? Xeloda dose reduced to 1000 mg every morning and 1000 mg every afternoon beginning 04/24/2018 due to diarrhea ? Xeloda discontinued after an office visit 05/15/2018 secondary to hand/foot syndrome and diarrhea ? CT abdomen/pelvis 06/12/2018-decrease in size of dominant left hepatic mass, no significant change in smaller liver lesions, stable biliary ductal dilatation, diffuse "colitis " ? CT 08/31/2018-decreased size of the segment 5 lesion, increased size of other liver lesions and new lesions, no evidence of extrahepatic metastatic disease, persistent colonic thickening ? Cycle 1 gemcitabine/Abraxane 09/12/2018 ? Cycle 2 gemcitabine/Abraxane 09/27/2018 ? Cycle 3 gemcitabine/Abraxane 10/10/2018 ? Cycle 4 gemcitabine/Abraxane 10/24/2018 ? Cycle 5 gemcitabine/Abraxane 11/07/2018 ? CT 11/20/2018, unchanged dominant segment 5 lesion, interval enlargement of local small hypodense lesions in the right liver, no evidence of distant metastatic disease, colonic wall thickening-resolved ? CTs reviewed by Dr. Danyla Wattley/radiologist, consistent with stable disease ?  Cycle 6 gemcitabine/Abraxane 12/05/2018 ? Cycle 7 gemcitabine/Abraxane 12/19/2018 ? Cycle 8 gemcitabine/Abraxane 01/02/2019 ? Cycle 9 gemcitabine/Abraxane 01/16/2019 ? Cycle 10 gemcitabine/Abraxane  02/06/2019 ? CTs 02/16/2019-stable liver lesions. Chronic changes of interstitial lung disease, appears mildly progressive. New trace bilateral pleural effusions and/or areas of pleural thickening. ? Cycle 11 gemcitabine/Abraxane 02/20/2019 (schedule changedto every 3 weeks going forward) ? Cycle 12 gemcitabine/Abraxane 03/19/2019 ? Cycle 13 gemcitabine/Abraxane 04/11/2019 2. Abdominal pain 3. History of diastolic heart failure 4. Hypertension 5. G3, P3 6. History of anemia 7. Right foot cellulitis 05/22/2018-Keflex prescribed 05/23/2018 C. difficile colitis 06/14/2018-treated with vancomycin, recurrent diarrhea 07/04/2018-second course of vancomycin (prolonged taper) prescribed, persistent diarrhea 08/06/2018-improved after diet change  8.  Hospital admission 03/13/2019-partial expressive aphasia and dehydration; negative MRI of the brain 03/14/2019.  Mental status improved. 9.  Interstitial lung disease     Disposition: Summer Nielsen appears unchanged.  She will continue gemcitabine/Abraxane.  Plan for a restaging CT evaluation proximately 2 months.  She has increased exertional dyspnea, most likely related to pulmonary fibrosis.  I have a low clinical suspicion for chemotherapy-induced pneumonitis.  Her exertional dyspnea predates the cancer diagnosis.  The dyspnea may be complicated by anemia.  Ms. Reamer will return for an office visit and chemotherapy in 3 weeks. Betsy Coder, MD  04/11/2019  9:33 AM

## 2019-04-11 NOTE — Progress Notes (Signed)
Per Dr. Benay Spice: OK to treat with elevated BP. Patient confirms that she took her losartan and propranolol to AM, and denies any headaches/dizziness/pain. Patient encouraged to f/u with PCP to address consistent high BP.

## 2019-04-12 ENCOUNTER — Telehealth: Payer: Self-pay | Admitting: Oncology

## 2019-04-12 LAB — CANCER ANTIGEN 19-9: CA 19-9: 80 U/mL — ABNORMAL HIGH (ref 0–35)

## 2019-04-12 NOTE — Telephone Encounter (Signed)
Scheduled per los. Called and spoke with patient. Confirmed appt

## 2019-04-23 ENCOUNTER — Telehealth: Payer: Self-pay | Admitting: *Deleted

## 2019-04-23 NOTE — Telephone Encounter (Signed)
Requesting to change her 2/25 appointments to another day. Scheduling message sent.

## 2019-04-26 ENCOUNTER — Telehealth: Payer: Self-pay | Admitting: Nurse Practitioner

## 2019-04-26 NOTE — Telephone Encounter (Signed)
New message  Patient would like a call in reference to her oxygen tank compressor. Please call.

## 2019-04-26 NOTE — Telephone Encounter (Signed)
S/w pt stated [employer] @ [phone] will not give pt a small compressor, stated was up to doctor.  Pt keeps getting bills for the small compressor but does not use it due to being 10 lbs.  Stated will call pt on Monday do get this handled as well as [employer]. Pt was agreeable.

## 2019-04-27 ENCOUNTER — Telehealth: Payer: Self-pay | Admitting: Oncology

## 2019-04-27 NOTE — Telephone Encounter (Signed)
R/s apt per 2/15 sch message - pt aware of appt date and time

## 2019-04-29 ENCOUNTER — Other Ambulatory Visit: Payer: Self-pay | Admitting: Oncology

## 2019-04-30 ENCOUNTER — Telehealth: Payer: Self-pay | Admitting: Nurse Practitioner

## 2019-04-30 NOTE — Telephone Encounter (Signed)
S/w Crystal and Kim at [place] @ [phone] Pt needed a purse like compression tank for O2.  Pt is not able to carry the one that was received that is over 10 Lbs.  Huey Romans stated the smaller tank does not weigh much lighter. 5-7 lbs.  Kim stated pt did not qualify for a smaller compression tank due to only holds 30 min of O2 and pt is on continuous O2.   S/w pt wanted to try smaller tank pt cannot pick the bigger one up and can not go anywhere with other tank.  Crystal stated on waiting list for smaller tank, need to fill out order, Order is being faxed to office. When tank comes in either pt goes to [facility] or Huey Romans goes to pt's house.  If pt qualifies for smaller tank will leave with pt, if not will take tank back. Would not disclose the wait time.  Pt also stated received a bill for 135.00 Maudie Mercury stated its a one time deal for both O2 tanks.  Pt will call when pt receives smaller tank.

## 2019-04-30 NOTE — Telephone Encounter (Signed)
S/w pt to let pt know lvm with [employer] and waiting to here back about pt's O2.

## 2019-04-30 NOTE — Telephone Encounter (Signed)
Summer Nielsen, from [employer] is calling about orders she received. She needs to speak to someone about the order because they are not able to do it. You can speak to anyone pick up the phone.

## 2019-04-30 NOTE — Telephone Encounter (Signed)
Received fax from [facility].  Pt's insurance and demographics need to be attached to fax.  S/w Lorriane Shire at med rec stated this is ok to attach.  Will fax back to [facility] once Tera Helper fills out paperwork.

## 2019-04-30 NOTE — Telephone Encounter (Signed)
Lori calling from [facility] to state pt can get a different O2 tank that is smaller than the previous one [facility] is no longer using the previous ones.  Orders sent over please follow previous phone note

## 2019-04-30 NOTE — Telephone Encounter (Signed)
lvm for [employer] to call back regarding pt's O2.

## 2019-05-01 ENCOUNTER — Encounter: Payer: Self-pay | Admitting: Nurse Practitioner

## 2019-05-01 ENCOUNTER — Telehealth: Payer: Self-pay | Admitting: *Deleted

## 2019-05-01 NOTE — Telephone Encounter (Signed)
New Message  Pt is returning your call

## 2019-05-01 NOTE — Telephone Encounter (Signed)
S/w pt to let pt know did not call pt today and might want to check to see if [employer] called pt.

## 2019-05-01 NOTE — Telephone Encounter (Signed)
error 

## 2019-05-01 NOTE — Telephone Encounter (Signed)
Faxed to [facility] new order for smaller portable oxygen system to [employer] _[phone].

## 2019-05-02 ENCOUNTER — Other Ambulatory Visit: Payer: Self-pay

## 2019-05-02 ENCOUNTER — Ambulatory Visit: Payer: Medicare PPO | Admitting: Pulmonary Disease

## 2019-05-02 ENCOUNTER — Encounter: Payer: Self-pay | Admitting: Pulmonary Disease

## 2019-05-02 DIAGNOSIS — J849 Interstitial pulmonary disease, unspecified: Secondary | ICD-10-CM | POA: Diagnosis not present

## 2019-05-02 MED ORDER — BREO ELLIPTA 100-25 MCG/INH IN AEPB: 1.0000 | INHALATION_SPRAY | Freq: Every day | RESPIRATORY_TRACT | 0 refills | Status: DC

## 2019-05-02 MED ORDER — BREO ELLIPTA 100-25 MCG/INH IN AEPB: 1.0000 | INHALATION_SPRAY | Freq: Every day | RESPIRATORY_TRACT | 3 refills | Status: AC

## 2019-05-02 NOTE — Progress Notes (Signed)
Summer Nielsen    [id number]    02-14-1928  Primary Care Physician:Webb, [name], MD  Referring Physician: Burtis Junes, NP [place]. 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  Symptoms have been present for many years but more progressive over the past few months.  No symptoms at rest.  Denies any cough, sputum production, fevers, chills She has chronic joint pain with mild deformation of the digits.  Denies any rash, dry mouth, dry eyes, dysphagia  She has history of cholangiocarcinoma with mets to the liver which is diagnosed in 2019.  She is under the care of Dr. Benay Spice and was initially treated with Xeloda.  Currently is receiving gemcitabine, Abraxane therapy.  History also notable for C. difficile colitis requiring admission in March 2020, she had a hospital admission in January 2021 with expressive aphasia which resolved, negative MRI  Pets: No pets.  Used to have a bird feeder outside the home many years ago Occupation: Retired Network engineer Exposures: No mold, hot tub, Jacuzzi Smoking history: Never smoked Travel history: No significant travel history Relevant family history: No significant family history of lung disease  Outpatient Encounter Medications as of 05/02/2019  Medication Sig  . acetaminophen (TYLENOL) 500 MG tablet Take 1,000 mg by mouth daily as needed for moderate pain or headache.  Marland Kitchen amLODipine (NORVASC) 5 MG tablet Take 5 mg by mouth daily.  . bimatoprost (LUMIGAN) 0.01 % SOLN Place 1 drop into both eyes at bedtime.   . calcium-vitamin D (OSCAL WITH D) 500-200 MG-UNIT tablet Take 1 tablet by mouth daily.   . furosemide (LASIX) 40 MG tablet Take 40-60 mg by mouth See admin instructions. Take 60 mg in the AM and 40 mg after lunch  . gabapentin (NEURONTIN) 300 MG  capsule TAKE 1 CAPSULE BY MOUTH THREE TIMES A DAY (Patient taking differently: Take 300 mg by mouth 3 (three) times daily. )  . HYDROmorphone (DILAUDID) 2 MG tablet Take 0.5-1 tablets (1-2 mg total) by mouth every 4 (four) hours as needed for severe pain.  Marland Kitchen ibuprofen (ADVIL,MOTRIN) 200 MG tablet Take 200 mg by mouth every 6 (six) hours as needed for moderate pain (back surgery).  . Iron-FA-B Cmp-C-Biot-Probiotic (FUSION PLUS) CAPS Take 1 capsule by mouth daily.  Marland Kitchen KLOR-CON M10 10 MEQ tablet TAKE 2 TABLETS EVERY DAY (Patient taking differently: Take 20 mEq by mouth daily. )  . levothyroxine (SYNTHROID, LEVOTHROID) 150 MCG tablet Take 150 mcg by mouth daily.  Marland Kitchen lidocaine-prilocaine (EMLA) cream Apply 1 application topically as directed.  . loperamide (IMODIUM) 2 MG capsule Take 2 capsules by mouth 4 (four) times daily as needed.  Marland Kitchen losartan (COZAAR) 50 MG tablet Take 50 mg by mouth daily.  . propranolol (INDERAL) 20 MG tablet Take 20 mg by mouth 2 (two) times daily.   Marland Kitchen omeprazole (PRILOSEC) 40 MG capsule Take 40 mg by mouth daily before breakfast.   . [DISCONTINUED] diphenoxylate-atropine (LOMOTIL) 2.5-0.025 MG tablet Take 2 tablets by mouth 4 (four) times daily as needed for diarrhea or loose stools. (Patient not taking: Reported on 04/11/2019)  . [DISCONTINUED] prochlorperazine (COMPAZINE) 5 MG tablet Take 1-2 tablets (5-10 mg total) by mouth every 6 (six) hours as needed for nausea or vomiting. (Patient not taking: Reported on 04/11/2019)   No facility-administered encounter medications on file  as of 05/02/2019.    Allergies as of 05/02/2019 - Review Complete 05/02/2019  Allergen Reaction Noted  . Levofloxacin Nausea Only 10/30/2015  . Percocet [oxycodone-acetaminophen] Nausea And Vomiting and Other (See Comments) 10/30/2015    Past Medical History:  Diagnosis Date  . Cancer ([place])    squamous cell skin cancer on left leg-removed  . CHF (congestive heart failure) ([place])   . Cholangiocarcinoma  ([place]) dx'd 11/2017  . Complication of anesthesia   . GERD (gastroesophageal reflux disease)   . Glaucoma   . High blood pressure   . Hyperlipidemia   . Hypothyroidism   . OP (osteoporosis)   . PONV (postoperative nausea and vomiting)   . Postsurgical hypothyroidism   . 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 Tachycardia   . TIA (transient ischemic attack)     Past Surgical History:  Procedure Laterality Date  . ABDOMINAL HYSTERECTOMY    . BACK SURGERY N/A 03/2017   Lumbar  . CHOLECYSTECTOMY    . EYE SURGERY     removed cateracts on both eyes  . IR IMAGING GUIDED PORT INSERTION  09/06/2018  . JOINT REPLACEMENT     3 hip replacement on left, right knee replacement  . THYROIDECTOMY      Family History  Problem Relation Age of Onset  . Heart attack Mother   . Cerebral aneurysm Mother   . Stroke Mother   . Hypertension Mother   . Heart attack Father   . Heart disease Sister   . Heart disease Brother     Social History   Socioeconomic History  . Marital status: Married    Spouse name: Not on file  . Number of children: Not on file  . Years of education: Not on file  . Highest education level: Not on file  Occupational History  . Not on file  Tobacco Use  . Smoking status: Never Smoker  . Smokeless tobacco: Never Used  Substance and Sexual Activity  . Alcohol use: No  . Drug use: No  . Sexual activity: Not on file  Other Topics Concern  . Not on file  Social History Narrative  . Not on file   Social Determinants of Health   Financial Resource Strain:   . Difficulty of Paying Living Expenses: Not on file  Food Insecurity:   . Worried About Charity fundraiser in the Last Year: Not on file  . Ran Out of Food in the Last Year: Not on file  Transportation Needs:   . Lack of Transportation (Medical): Not on file  . Lack of Transportation (Non-Medical): Not on file  Physical Activity:   . Days of Exercise per Week: Not on file    . Minutes of Exercise per Session: Not on file  Stress:   . Feeling of Stress : Not on file  Social Connections:   . Frequency of Communication with Friends and Family: Not on file  . Frequency of Social Gatherings with Friends and Family: Not on file  . Attends Religious Services: Not on file  . Active Member of Clubs or Organizations: Not on file  . Attends Archivist Meetings: Not on file  . Marital Status: Not on file  Intimate Partner Violence:   . Fear of Current or Ex-Partner: Not on file  . Emotionally Abused: Not on file  . Physically Abused: Not on file  . Sexually Abused: Not on file    Review of systems: Review  of Systems  Constitutional: Negative for fever and chills.  HENT: Negative.   Eyes: Negative for blurred vision.  Respiratory: as per HPI  Cardiovascular: Negative for chest pain and palpitations.  Gastrointestinal: Negative for vomiting, diarrhea, blood per rectum. Genitourinary: Negative for dysuria, urgency, frequency and hematuria.  Musculoskeletal: Negative for myalgias, back pain and joint pain.  Skin: Negative for itching and rash.  Neurological: Negative for dizziness, tremors, focal weakness, seizures and loss of consciousness.  Endo/Heme/Allergies: Negative for environmental allergies.  Psychiatric/Behavioral: Negative for depression, suicidal ideas and hallucinations.  All other systems reviewed and are negative.  Physical Exam: Blood pressure 112/62, pulse 66, temperature 97.8 F (36.6 C), temperature source Temporal, height 5' 2" (1.575 m), weight 133 lb 12.8 oz (60.7 kg), SpO2 95 %. Gen:      No acute distress HEENT:  EOMI, sclera anicteric Neck:     No masses; no thyromegaly Lungs:    Clear to auscultation bilaterally; normal respiratory effort CV:         Regular rate and rhythm; no murmurs Abd:      + bowel sounds; soft, non-tender; no palpable masses, no distension Ext:    No edema; adequate peripheral perfusion Skin:       Warm and dry; no rash Neuro: alert and oriented x 3 Psych: normal mood and affect  Data Reviewed: Imaging: CT high-resolution 09/07/2017-anterior liver mass, patchy subpleural and bronchovascular reticulation with groundglass, minimal traction bronchiectasis with air trapping.  Indeterminate for UIP  CT chest 02/16/2019-multiple liver lesions, patchy groundglass attenuation, septal thickening with reticulation and bronchiectasis. Air trapping.  Indeterminate for UIP.  Labs: 08/31/2017 hypersensitivity panel-negative 6/26/9 ANA negative, rheumatoid factor less than 14, CCP 30  Cardiac: Echocardiogram 10/19/2018-LVEF 60 to 65%, normal RV systolic function.  Assessment:  Interstitial lung disease CT scan reviewed with pulmonary fibrosis.  The pattern appears to be indeterminate with significant air trapping suggestive of hypersensitivity although she does not have any exposures.  Get labs today for CTD assessment especially given mildly elevated CCP and digit deformities.  Schedule high-resolution CT and pulmonary function test Trial Breo inhaler She is not a good candidate for either bronchoscope or surgical lung biopsy given age and frailty  Return to clinic in 2 to 4 weeks to review results and determine next steps. Discussed in detail with patient and her son in office today.  Plan/Recommendations: CTD serologies High-res CT, PFTs Breo inhaler  This appointment required 50 minutes of patient care (this includes precharting, chart review, review of results, face-to-face care, etc.).  Marshell Garfinkel MD [facility] 05/02/2019, 10:21 AM  CC: Burtis Junes, NP

## 2019-05-02 NOTE — Patient Instructions (Signed)
We will get labs today for interstitial lung disease assessment Schedule high-resolution CT and pulmonary function test We will start you on an inhaler called Breo Follow-up in 1 to 2 months.

## 2019-05-03 ENCOUNTER — Inpatient Hospital Stay: Payer: Medicare PPO

## 2019-05-03 ENCOUNTER — Inpatient Hospital Stay: Payer: Medicare PPO | Admitting: Nurse Practitioner

## 2019-05-04 LAB — ANA,IFA RA DIAG PNL W/RFLX TIT/PATN
Anti Nuclear Antibody (ANA): POSITIVE — AB
Cyclic Citrullin Peptide Ab: 54 UNITS — ABNORMAL HIGH
Rheumatoid fact SerPl-aCnc: <14 IU/mL (ref <14)

## 2019-05-04 LAB — ANTI-NUCLEAR AB-TITER (ANA TITER)
ANA TITER: 1:40 {titer} — ABNORMAL HIGH
ANA TITER: 1:40 {titer} — ABNORMAL HIGH
ANA Titer 1: 1:40 {titer} — ABNORMAL HIGH

## 2019-05-04 LAB — SJOGREN'S SYNDROME ANTIBODS(SSA + SSB)
SSA (Ro) (ENA) Antibody, IgG: <1 AI
SSB (La) (ENA) Antibody, IgG: <1 AI

## 2019-05-04 LAB — ANTI-SCLERODERMA ANTIBODY: Scleroderma (Scl-70) (ENA) Antibody, IgG: <1 AI

## 2019-05-04 LAB — ANCA SCREEN W REFLEX TITER: ANCA Screen: NEGATIVE

## 2019-05-08 ENCOUNTER — Inpatient Hospital Stay: Payer: Medicare PPO | Admitting: Oncology

## 2019-05-08 ENCOUNTER — Other Ambulatory Visit: Payer: Self-pay

## 2019-05-08 ENCOUNTER — Inpatient Hospital Stay: Payer: Medicare PPO

## 2019-05-08 ENCOUNTER — Inpatient Hospital Stay: Payer: Medicare PPO | Attending: Oncology

## 2019-05-08 VITALS — BP 141/53 | HR 66 | Temp 98.2°F | Resp 17 | Ht 62.0 in | Wt 134.5 lb

## 2019-05-08 DIAGNOSIS — J849 Interstitial pulmonary disease, unspecified: Secondary | ICD-10-CM | POA: Diagnosis not present

## 2019-05-08 DIAGNOSIS — R0609 Other forms of dyspnea: Secondary | ICD-10-CM | POA: Diagnosis not present

## 2019-05-08 DIAGNOSIS — C221 Intrahepatic bile duct carcinoma: Secondary | ICD-10-CM | POA: Insufficient documentation

## 2019-05-08 DIAGNOSIS — I110 Hypertensive heart disease with heart failure: Secondary | ICD-10-CM | POA: Diagnosis not present

## 2019-05-08 DIAGNOSIS — Z95828 Presence of other vascular implants and grafts: Secondary | ICD-10-CM

## 2019-05-08 DIAGNOSIS — I5032 Chronic diastolic (congestive) heart failure: Secondary | ICD-10-CM | POA: Diagnosis not present

## 2019-05-08 DIAGNOSIS — Z9221 Personal history of antineoplastic chemotherapy: Secondary | ICD-10-CM | POA: Insufficient documentation

## 2019-05-08 LAB — CMP (CANCER CENTER ONLY)
ALT: 10 U/L (ref 0–44)
AST: 20 U/L (ref 15–41)
Albumin: 2.9 g/dL — ABNORMAL LOW (ref 3.5–5.0)
Alkaline Phosphatase: 70 U/L (ref 38–126)
Anion gap: 10 (ref 5–15)
BUN: 37 mg/dL — ABNORMAL HIGH (ref 8–23)
CO2: 21 mmol/L — ABNORMAL LOW (ref 22–32)
Calcium: 8.3 mg/dL — ABNORMAL LOW (ref 8.9–10.3)
Chloride: 109 mmol/L (ref 98–111)
Creatinine: 1.58 mg/dL — ABNORMAL HIGH (ref 0.44–1.00)
GFR, Est AFR Am: 33 mL/min — ABNORMAL LOW (ref >60)
GFR, Estimated: 29 mL/min — ABNORMAL LOW (ref >60)
Glucose, Bld: 133 mg/dL — ABNORMAL HIGH (ref 70–99)
Potassium: 4.4 mmol/L (ref 3.5–5.1)
Sodium: 140 mmol/L (ref 135–145)
Total Bilirubin: 0.5 mg/dL (ref 0.3–1.2)
Total Protein: 6.4 g/dL — ABNORMAL LOW (ref 6.5–8.1)

## 2019-05-08 LAB — CBC WITH DIFFERENTIAL (CANCER CENTER ONLY)
Abs Immature Granulocytes: 0.03 10*3/uL (ref 0.00–0.07)
Basophils Absolute: 0.1 10*3/uL (ref 0.0–0.1)
Basophils Relative: 1 %
Eosinophils Absolute: 0.8 10*3/uL — ABNORMAL HIGH (ref 0.0–0.5)
Eosinophils Relative: 8 %
HCT: 28.7 % — ABNORMAL LOW (ref 36.0–46.0)
Hemoglobin: 8.9 g/dL — ABNORMAL LOW (ref 12.0–15.0)
Immature Granulocytes: 0 %
Lymphocytes Relative: 8 %
Lymphs Abs: 0.8 10*3/uL (ref 0.7–4.0)
MCH: 31.3 pg (ref 26.0–34.0)
MCHC: 31 g/dL (ref 30.0–36.0)
MCV: 101.1 fL — ABNORMAL HIGH (ref 80.0–100.0)
Monocytes Absolute: 1.1 10*3/uL — ABNORMAL HIGH (ref 0.1–1.0)
Monocytes Relative: 12 %
Neutro Abs: 6.7 10*3/uL (ref 1.7–7.7)
Neutrophils Relative %: 71 %
Platelet Count: 385 10*3/uL (ref 150–400)
RBC: 2.84 MIL/uL — ABNORMAL LOW (ref 3.87–5.11)
RDW: 18.3 % — ABNORMAL HIGH (ref 11.5–15.5)
WBC Count: 9.5 10*3/uL (ref 4.0–10.5)
nRBC: 0 % (ref 0.0–0.2)

## 2019-05-08 LAB — HYPERSENSITIVITY PNEUMONITIS
A. Pullulans Abs: NEGATIVE
A.Fumigatus #1 Abs: NEGATIVE
Micropolyspora faeni, IgG: NEGATIVE
Pigeon Serum Abs: NEGATIVE
Thermoact. Saccharii: NEGATIVE
Thermoactinomyces vulgaris, IgG: NEGATIVE

## 2019-05-08 MED ORDER — SODIUM CHLORIDE 0.9% FLUSH
10.0000 mL | INTRAVENOUS | Status: DC | PRN
  Administered 2019-05-08: 10 mL
  Filled 2019-05-08: qty 10

## 2019-05-08 MED ORDER — HEPARIN SOD (PORK) LOCK FLUSH 100 UNIT/ML IV SOLN
500.0000 [IU] | Freq: Once | INTRAVENOUS | Status: AC | PRN
  Administered 2019-05-08: 500 [IU]
  Filled 2019-05-08: qty 5

## 2019-05-08 NOTE — Progress Notes (Signed)
[place] OFFICE PROGRESS NOTE   Diagnosis: Cholangiocarcinoma  INTERVAL HISTORY:   Ms. Myer completed another cycle of chemotherapy on 04/11/2019.  No rash, fever, or neuropathy symptoms.  She has exertional dyspnea.  This has progressed.  She has been evaluated by pulmonary medicine and is scheduled to have a high-resolution chest CT next week.  No abdominal pain.  Objective:  Vital signs in last 24 hours:  Blood pressure (!) 141/53, pulse 66, temperature 98.2 F (36.8 C), temperature source Oral, resp. rate 17, height _0  (1.575 m), weight 134 lb 8 oz (61 kg), SpO2 97 %.     Resp: Diffuse inspiratory rales and rhonchi, no respiratory distress Cardio: Regular rate and rhythm GI: Nontender, no hepatomegaly Vascular: Trace pitting edema at the right greater than left lower leg   Portacath/PICC-without erythema  Lab Results:  Lab Results  Component Value Date   WBC 9.5 05/08/2019   HGB 8.9 (L) 05/08/2019   HCT 28.7 (L) 05/08/2019   MCV 101.1 (H) 05/08/2019   PLT 385 05/08/2019   NEUTROABS 6.7 05/08/2019    CMP  Lab Results  Component Value Date   NA 140 05/08/2019   K 4.4 05/08/2019   CL 109 05/08/2019   CO2 21 (L) 05/08/2019   GLUCOSE 133 (H) 05/08/2019   BUN 37 (H) 05/08/2019   CREATININE 1.58 (H) 05/08/2019   CALCIUM 8.3 (L) 05/08/2019   PROT 6.4 (L) 05/08/2019   ALBUMIN 2.9 (L) 05/08/2019   AST 20 05/08/2019   ALT 10 05/08/2019   ALKPHOS 70 05/08/2019   BILITOT 0.5 05/08/2019   GFRNONAA 29 (L) 05/08/2019   GFRAA 33 (L) 05/08/2019    Lab Results  Component Value Date   CEA1 1.96 03/21/2018     Medications: I have reviewed the patient's current medications.   Assessment/Plan: 1. Anterior liver mass, ultrasound-guided biopsy 09/22/2017-adenocarcinoma, MSI-stable, tumor mutation burden 0,IDH1 R132G, BAP1 loss ? CT chest 09/07/2017, 4.1 cm hypodense anterior liver mass, changes of chronic hypersensitivity pneumonitis ? MRI abdomen  09/13/2017-isolated liver mass measuring 4.7 x 3.8 cm involving segments 4B and 5, no additional liver masses ? SBRT to the liver mass beginning 10/31/2017, completed 11/10/2017 ? CT abdomen/pelvis 01/23/2018-radiation changes in the liver, liver mass measuring 5.9 x 4.4 x 5.0 cm but no evidence of metastatic disease in the abdomen or pelvis ? MRI abdomen 02/22/2018-dominant central left liver lesion slightly increased in size with decreased central enhancement suggesting necrosis, 2 new lesions including an 11 mm left hepatic lesion and a 9 mm right hepatic lesion ? Xeloda 7 days on/7 days off beginning 03/27/2018 (1500 mg every morning and 1000 mg every afternoon) ? Xeloda dose reduced to 1000 mg every morning and 1000 mg every afternoon beginning 04/24/2018 due to diarrhea ? Xeloda discontinued after an office visit 05/15/2018 secondary to hand/foot syndrome and diarrhea ? CT abdomen/pelvis 06/12/2018-decrease in size of dominant left hepatic mass, no significant change in smaller liver lesions, stable biliary ductal dilatation, diffuse "colitis " ? CT 08/31/2018-decreased size of the segment 5 lesion, increased size of other liver lesions and new lesions, no evidence of extrahepatic metastatic disease, persistent colonic thickening ? Cycle 1 gemcitabine/Abraxane 09/12/2018 ? Cycle 2 gemcitabine/Abraxane 09/27/2018 ? Cycle 3 gemcitabine/Abraxane 10/10/2018 ? Cycle 4 gemcitabine/Abraxane 10/24/2018 ? Cycle 5 gemcitabine/Abraxane 11/07/2018 ? CT 11/20/2018, unchanged dominant segment 5 lesion, interval enlargement of local small hypodense lesions in the right liver, no evidence of distant metastatic disease, colonic wall thickening-resolved ? CTs reviewed by  Dr. Carisma Troupe/radiologist, consistent with stable disease ? Cycle 6 gemcitabine/Abraxane 12/05/2018 ? Cycle 7 gemcitabine/Abraxane 12/19/2018 ? Cycle 8 gemcitabine/Abraxane 01/02/2019 ? Cycle 9 gemcitabine/Abraxane 01/16/2019 ? Cycle 10 gemcitabine/Abraxane  02/06/2019 ? CTs 02/16/2019-stable liver lesions. Chronic changes of interstitial lung disease, appears mildly progressive. New trace bilateral pleural effusions and/or areas of pleural thickening. ? Cycle 11 gemcitabine/Abraxane 02/20/2019 (schedule changedto every 3 weeks going forward) ? Cycle 12 gemcitabine/Abraxane 03/19/2019 ? Cycle 13 gemcitabine/Abraxane 04/11/2019  2. Abdominal pain 3. History of diastolic heart failure 4. Hypertension 5. G3, P3 6. History of anemia 7. Right foot cellulitis 05/22/2018-Keflex prescribed 05/23/2018 C. difficile colitis 06/14/2018-treated with vancomycin, recurrent diarrhea 07/04/2018-second course of vancomycin (prolonged taper) prescribed, persistent diarrhea 08/06/2018-improved after diet change  8.  Hospital admission 03/13/2019-partial expressive aphasia and dehydration; negative MRI of the brain 03/14/2019.  Mental status improved. 9.  Interstitial lung disease     Disposition: Ms. Tappen has developed progressive exertional dyspnea.  This appears to be related to interstitial lung disease.  She had a degree of lung disease prior to beginning chemotherapy.  However it is possible the gemcitabine or Abraxane has contributed.  She is scheduled to undergo restaging CTs next week.  We will hold gemcitabine and Abraxane today.  The creatinine is elevated today.  She will hold Lasix and losartan for several days prior to the CT next week.  She will return for a chemistry panel on 05/14/2019.  Ms. Munoz will be scheduled for an office visit on 05/22/2019.  Betsy Coder, MD  05/08/2019  9:36 AM

## 2019-05-08 NOTE — Patient Instructions (Addendum)
Per Dr. Benay Spice to prepare for CT scan on 05/15/19: Hold Lasix and Losartan starting on Saturday and push fluids. Return to [facility] on 3/10 for labs to assess kidney status. Adding CT abdomen to the chest ordered by pulmonary on 05/17/19-- time changed to 1:20 pm with arrival at 1 pm at GI on [address]. NPO after 0900 and drink contrast at 1120 and 1220 that day (provided).

## 2019-05-08 NOTE — Progress Notes (Signed)
Per Dr. Benay Spice: OK to treat today with creatinine 1.58. Will hold Gemzar today as well. Dr. Benay Spice spoke with pharmacist and has decided to hold tx today, proceed with scan and regroup afterwards. Patient notified and Dr. Benay Spice will talk with her in infusion area before the returns home today. She was provided her oral contrast for scan.

## 2019-05-08 NOTE — Patient Instructions (Signed)

## 2019-05-14 ENCOUNTER — Telehealth: Payer: Self-pay | Admitting: Oncology

## 2019-05-14 LAB — MYOMARKER 3 PLUS PROFILE (RDL)

## 2019-05-14 NOTE — Telephone Encounter (Signed)
Scheduled per los. Called and spoke with patient. Patient has another appt that day that she will try to reschedule. Patient will call back if she needs to reschedule here

## 2019-05-15 ENCOUNTER — Telehealth: Payer: Self-pay | Admitting: Pulmonary Disease

## 2019-05-15 ENCOUNTER — Other Ambulatory Visit: Payer: Medicare PPO

## 2019-05-15 NOTE — Progress Notes (Deleted)
CARDIOLOGY OFFICE NOTE  Date:  05/15/2019    Summer Nielsen Date of Birth: [birth year] Medical Record #[id number]  PCP:  Maurice Small, MD  Cardiologist:  Servando Snare & ***    No chief complaint on file.   History of Present Illness: Summer Nielsen is a 86 y.o. female who presents today for a *** Seen for Dr. Marlou Porch.   She has a hx ofacute diastolic heart failure with elevated BNP, grade 2 diastolic dysfunction on echocardiogram,and priorpulmonary infiltrate.  In July 2017, she began having more shortness of breath and was diagnosed originally with pneumonia. No fever, no chills. Treated with antibiotic course. She never improved that much. Dr. Justin Mend checked an echocardiogram which showed grade 2 diastolic dysfunction and BNP which was 200. She placed her on Lasix 40 mg once a day. Her lower extremity edema improved. Her breathingimprovedto a certain degree. She has had some bradycardia with Inderal noted. There has been concern for potential pulmonary fibrosis. She has had a liver mass noted last year in 2019 - found to have cancer - treated with XRT and chemotherapy.   I saw her back in August - felt like she was doing "fair". Was on IV chemo. Still pretty short of breath. Still able to do most of her ADLS and also taking care of her 71 year old husband.   The patient does not have symptoms concerning for COVID-19 infection (fever, chills, cough, or new shortness of breath).   Comes in today. Here alone. She seems more deconditioned. Still trying to take care of her 47 year old husband. Not getting out as much. More shortness of breath. Little cough. She has scans later this week with oncology follow up. Still with some edema. Her echo looked pretty good. She is still on chemo - getting Gemzar and Abraxane. No hemoptysis. No chest pain.  The patient {does/does not:200015} have symptoms concerning for COVID-19 infection (fever, chills, cough, or new shortness of breath).    Comes in today. Here with   Past Medical History:  Diagnosis Date  . Cancer ([place])    squamous cell skin cancer on left leg-removed  . CHF (congestive heart failure) ([place])   . 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 Complication of anesthesia   . GERD (gastroesophageal reflux disease)   . Glaucoma   . High blood pressure   . Hyperlipidemia   . Hypothyroidism   . OP (osteoporosis)   . PONV (postoperative nausea and vomiting)   . Postsurgical hypothyroidism   . 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 Tachycardia   . TIA (transient ischemic attack)     Past Surgical History:  Procedure Laterality Date  . ABDOMINAL HYSTERECTOMY    . BACK SURGERY N/A 03/2017   Lumbar  . CHOLECYSTECTOMY    . EYE SURGERY     removed cateracts on both eyes  . IR IMAGING GUIDED PORT INSERTION  09/06/2018  . JOINT REPLACEMENT     3 hip replacement on left, right knee replacement  . THYROIDECTOMY       Medications: No outpatient medications have been marked as taking for the 05/23/19 encounter (Appointment) with Burtis Junes, NP.     Allergies: Allergies  Allergen Reactions  . Levofloxacin Nausea Only    Dizzy, hot, foot pain, insomnia  . Percocet [Oxycodone-Acetaminophen] Nausea And Vomiting and Other (See Comments)    sick    Social History: The patient  reports that she has never smoked.  She has never used smokeless tobacco. She reports that she does not drink alcohol or use drugs.   Family History: The patient's ***family history includes Cerebral aneurysm in her mother; Heart attack in her father and mother; Heart disease in her brother and sister; Hypertension in her mother; Stroke in her mother.   Review of Systems: Please see the history of present illness.   All other systems are reviewed and negative.   Physical Exam: VS:  LMP  (LMP Unknown)  .  BMI There is no height or weight on file to calculate BMI.  Wt Readings from Last 3  Encounters:  05/08/19 134 lb 8 oz (61 kg)  05/02/19 133 lb 12.8 oz (60.7 kg)  04/11/19 130 lb 1.6 oz (59 kg)    General: Pleasant. Well developed, well nourished and in no acute distress.   HEENT: Normal.  Neck: Supple, no JVD, carotid bruits, or masses noted.  Cardiac: ***Regular rate and rhythm. No murmurs, rubs, or gallops. No edema.  Respiratory:  Lungs are clear to auscultation bilaterally with normal work of breathing.  GI: Soft and nontender.  MS: No deformity or atrophy. Gait and ROM intact.  Skin: Warm and dry. Color is normal.  Neuro:  Strength and sensation are intact and no gross focal deficits noted.  Psych: Alert, appropriate and with normal affect.   LABORATORY DATA:  EKG:  EKG {ACTION; IS/IS FGH:[id number]} ordered today. This demonstrates ***.  Lab Results  Component Value Date   WBC 9.5 05/08/2019   HGB 8.9 (L) 05/08/2019   HCT 28.7 (L) 05/08/2019   PLT 385 05/08/2019   GLUCOSE 133 (H) 05/08/2019   CHOL 164 03/23/2018   TRIG 169 (H) 03/23/2018   HDL 45 03/23/2018   LDLCALC 85 03/23/2018   ALT 10 05/08/2019   AST 20 05/08/2019   NA 140 05/08/2019   K 4.4 05/08/2019   CL 109 05/08/2019   CREATININE 1.58 (H) 05/08/2019   BUN 37 (H) 05/08/2019   CO2 21 (L) 05/08/2019   TSH 9.900 (H) 08/08/2017   INR 1.1 09/06/2018     BNP (last 3 results) No results for input(s): BNP in the last 8760 hours.  ProBNP (last 3 results) No results for input(s): PROBNP in the last 8760 hours.   Other Studies Reviewed Today:   Assessment/Plan: ECHO IMPRESSIONS 10/2018   1. The left ventricle has normal systolic function with an ejection fraction of 60-65%. The cavity size was normal. Left ventricular diastolic parameters were normal. 2. Normal GLS -18.7. 3. The right ventricle has normal systolic function. The cavity was normal. There is no increase in right ventricular wall thickness. 4. Left atrial size was mildly dilated. 5. Mild thickening of the mitral  valve leaflet. Mild calcification of the mitral valve leaflet. There is mild mitral annular calcification present. 6. The aortic valve is tricuspid. Mild thickening of the aortic valve. Mild calcification of the aortic valve. Aortic valve regurgitation is trivial by color flow Doppler. 7. The aorta is normal unless otherwise noted. 8. Small calcified mobile structure likely represents chiarri network.   MyoviewStudy Highlights4/2018    Nuclear stress EF: 71%.  No T wave inversion was noted during stress.  There was no ST segment deviation noted during stress.  This is a low risk study.  No reversible ischemia. LVEF 71% with normal wall motion. This is a low risk study.    ECHOStudy Conclusions3/2018  - Left ventricle: The cavity size was normal. Systolic function was normal. The  estimated ejection fraction was in the range of 55% to 60%. Wall motion was normal; there were no regional wall motion abnormalities. There was a reduced contribution of atrial contraction to ventricular filling, due to increased ventricular diastolic pressure or atrial contractile dysfunction. Features are consistent with a pseudonormal left ventricular filling pattern, with concomitant abnormal relaxation and increased filling pressure (grade 2 diastolic dysfunction). - Aortic valve: Trileaflet; mildly thickened, mildly calcified leaflets. There was trivial regurgitation. - Mitral valve: Calcified annulus. Valve area by pressure half-time: 1.45 cm^2. - Left atrium: The atrium was moderately dilated. - Pulmonary arteries: PA peak pressure: 35 mm Hg (S).    ASSESSMENT AND PLAN:  1.Progressive DOE - her exam seems to be more consistent with fibrosis - she has had pneumonitis. She has been to pulmonary in the past and did not wish to return there. She was quite hypoxic here in the office - resting oxygen sat was 96%. With ambulation less than 200 feet - she  dropped to 81% and was markedly and visibly SOB. Oxygen was placed at 2 liters with improvement in her oxygen saturations back to 100%. She felt dramatic improvement. I have asked her to see her PCP. We are arranging oxygen at 2 liter per [facility] thru [employer] - have talked with Magda Paganini [phone]). This is going to be delivered today.   2. Chronic diastolic HF - little more edema on exam - stopping Norvasc today. Increasing her Lasix.   3. HTN - BP is fine - stopping Norvasc today.   4. HLD - has liver cancer - would stop her statin therapy   5. Liver cancer- followed by Oncology -still on chemo - looked to have progression by her last CT that I see - she is having repeat scans Friday with follow up. She may need CT of the chest as well.   6. Chronic resting bradycardia - not noted today.   Marland Kitchen COVID-19 Education: The signs and symptoms of COVID-19 were discussed with the patient and how to seek care for testing (follow up with PCP or arrange E-visit).  The importance of social distancing, staying at home, hand hygiene and wearing a mask when out in public were discussed today.  Current medicines are reviewed with the patient today.  The patient does not have concerns regarding medicines other than what has been noted above.  The following changes have been made:  See above.  Labs/ tests ordered today include:   No orders of the defined types were placed in this encounter.    Disposition:   FU with *** in {gen number 7-61:607371} {Days to years:10300}.   Patient is agreeable to this plan and will call if any problems develop in the interim.   SignedTruitt Merle, NP  05/15/2019 1:42 PM  [facility] [address] [place], [place]  [address] Phone: [phone] Fax: [phone]

## 2019-05-15 NOTE — Telephone Encounter (Signed)
Called and spoke with pt letting her know why her HRCT was changed and she verbalized understanding. Nothing further needed.

## 2019-05-16 ENCOUNTER — Other Ambulatory Visit: Payer: Self-pay

## 2019-05-16 ENCOUNTER — Inpatient Hospital Stay: Payer: Medicare PPO

## 2019-05-16 ENCOUNTER — Telehealth: Payer: Self-pay | Admitting: Primary Care

## 2019-05-16 ENCOUNTER — Telehealth: Payer: Self-pay | Admitting: *Deleted

## 2019-05-16 DIAGNOSIS — I130 Hypertensive heart and chronic kidney disease with heart failure and stage 1 through stage 4 chronic kidney disease, or unspecified chronic kidney disease: Secondary | ICD-10-CM | POA: Diagnosis not present

## 2019-05-16 DIAGNOSIS — C221 Intrahepatic bile duct carcinoma: Secondary | ICD-10-CM

## 2019-05-16 DIAGNOSIS — J9600 Acute respiratory failure, unspecified whether with hypoxia or hypercapnia: Secondary | ICD-10-CM | POA: Diagnosis not present

## 2019-05-16 LAB — BASIC METABOLIC PANEL - CANCER CENTER ONLY
Anion gap: 9 (ref 5–15)
BUN: 41 mg/dL — ABNORMAL HIGH (ref 8–23)
CO2: 22 mmol/L (ref 22–32)
Calcium: 9.2 mg/dL (ref 8.9–10.3)
Chloride: 108 mmol/L (ref 98–111)
Creatinine: 1.46 mg/dL — ABNORMAL HIGH (ref 0.44–1.00)
GFR, Est AFR Am: 36 mL/min — ABNORMAL LOW (ref >60)
GFR, Estimated: 31 mL/min — ABNORMAL LOW (ref >60)
Glucose, Bld: 126 mg/dL — ABNORMAL HIGH (ref 70–99)
Potassium: 5.2 mmol/L — ABNORMAL HIGH (ref 3.5–5.1)
Sodium: 139 mmol/L (ref 135–145)

## 2019-05-16 MED ORDER — ALBUTEROL SULFATE HFA 108 (90 BASE) MCG/ACT IN AERS: 2.0000 | INHALATION_SPRAY | Freq: Four times a day (QID) | RESPIRATORY_TRACT | 5 refills | Status: AC | PRN

## 2019-05-16 NOTE — Telephone Encounter (Signed)
Called and spoke with pt letting her know the info stated by [facility] and she verbalized understanding. Pt has been scheduled televisit with Beth at 10:30 tomorrow 3/11. Pt stated that she does not have a rescue inhaler and would like to have one sent to pharmacy for her. I have sent albuterol inhaler to pharmacy for pt.  Pt was told to go to ER if breathing worsened overnight. Nothing further needed.

## 2019-05-16 NOTE — Telephone Encounter (Signed)
I would make televisit tomorrow with App or Mannam. If she has a rescue inhaler try using that every 4-6 hours. If not we can send one in for her. If symptoms worsen over night needs to go to ED for evaluation.

## 2019-05-16 NOTE — Telephone Encounter (Signed)
[facility] Radiology and spoke w/Haley regarding persistent elevation on creatinine. Dr. Benay Spice wants scan with NO IV contrast. She will make a note of this for tomorrow. Called patient and informed her and instructed her to continue to hold Lasix, Losartan and K+ now since her K+ is high. Informed her not to let anyone inject contrast tomorrow. She understands and agrees.

## 2019-05-16 NOTE — Telephone Encounter (Signed)
Primary Pulmonologist: Mannam Last office visit and with whom: 05/02/19 What do we see them for (pulmonary problems): ILD Last OV assessment/plan: Assessment:  Interstitial lung disease CT scan reviewed with pulmonary fibrosis.  The pattern appears to be indeterminate with significant air trapping suggestive of hypersensitivity although she does not have any exposures.  Get labs today for CTD assessment especially given mildly elevated CCP and digit deformities.  Schedule high-resolution CT and pulmonary function test Trial Breo inhaler She is not a good candidate for either bronchoscope or surgical lung biopsy given age and frailty  Return to clinic in 2 to 4 weeks to review results and determine next steps. Discussed in detail with patient and her son in office today.  Plan/Recommendations: CTD serologies High-res CT, PFTs Breo inhaler  Was appointment offered to patient (explain)?  Pt is requesting meds   Reason for call: Called and spoke with pt who stated she is having a hard time trying to get her breath today. Pt also has complaints of wheezing.  Asked pt if she had a pulse ox and she stated that she did not. Pt is on O2 that she wears at 3L 24/7. Pt states even with the O2, she feels like she is struggling to catch her breath.  Pt also states that she is coughing and is coughing up dark yellow phlegm.  Pt is taking all meds as prescribed and she states nothing is helping. Pt is doing the breo every day and is not getting any relief.  Pt states her breathing became worse when she got up this morning and has been bad ever since.  Pt wants recommendations to help with her symptoms. Beth, please advise on this for pt. Thanks!  (examples of things to ask: : When did symptoms start? Fever? Cough? Productive? Color to sputum? More sputum than usual? Wheezing? Have you needed increased oxygen? Are you taking your respiratory medications? What over the counter measures have you  tried?)

## 2019-05-17 ENCOUNTER — Ambulatory Visit (INDEPENDENT_AMBULATORY_CARE_PROVIDER_SITE_OTHER): Payer: Medicare PPO | Admitting: Primary Care

## 2019-05-17 ENCOUNTER — Emergency Department (HOSPITAL_COMMUNITY): Payer: Medicare PPO

## 2019-05-17 ENCOUNTER — Encounter: Payer: Self-pay | Admitting: Primary Care

## 2019-05-17 ENCOUNTER — Inpatient Hospital Stay (HOSPITAL_COMMUNITY)
Admission: EM | Admit: 2019-05-17 | Discharge: 2019-05-24 | DRG: 291 | Disposition: A | Discharge status: Hospice | Payer: Medicare PPO | Attending: Internal Medicine | Admitting: Internal Medicine

## 2019-05-17 ENCOUNTER — Ambulatory Visit: Payer: Medicare PPO

## 2019-05-17 ENCOUNTER — Encounter (HOSPITAL_COMMUNITY): Payer: Self-pay

## 2019-05-17 ENCOUNTER — Telehealth: Payer: Self-pay | Admitting: Primary Care

## 2019-05-17 ENCOUNTER — Other Ambulatory Visit: Payer: Self-pay

## 2019-05-17 ENCOUNTER — Ambulatory Visit: Admission: RE | Admit: 2019-05-17 | Payer: Medicare PPO | Source: Ambulatory Visit

## 2019-05-17 ENCOUNTER — Telehealth: Payer: Self-pay | Admitting: Pulmonary Disease

## 2019-05-17 DIAGNOSIS — J9611 Chronic respiratory failure with hypoxia: Secondary | ICD-10-CM | POA: Diagnosis not present

## 2019-05-17 DIAGNOSIS — E890 Postprocedural hypothyroidism: Secondary | ICD-10-CM | POA: Diagnosis present

## 2019-05-17 DIAGNOSIS — Z9221 Personal history of antineoplastic chemotherapy: Secondary | ICD-10-CM

## 2019-05-17 DIAGNOSIS — K219 Gastro-esophageal reflux disease without esophagitis: Secondary | ICD-10-CM | POA: Diagnosis present

## 2019-05-17 DIAGNOSIS — Z7952 Long term (current) use of systemic steroids: Secondary | ICD-10-CM

## 2019-05-17 DIAGNOSIS — Z79890 Hormone replacement therapy (postmenopausal): Secondary | ICD-10-CM

## 2019-05-17 DIAGNOSIS — J9621 Acute and chronic respiratory failure with hypoxia: Secondary | ICD-10-CM | POA: Diagnosis present

## 2019-05-17 DIAGNOSIS — D6481 Anemia due to antineoplastic chemotherapy: Secondary | ICD-10-CM | POA: Diagnosis present

## 2019-05-17 DIAGNOSIS — T380X5A Adverse effect of glucocorticoids and synthetic analogues, initial encounter: Secondary | ICD-10-CM | POA: Diagnosis not present

## 2019-05-17 DIAGNOSIS — C221 Intrahepatic bile duct carcinoma: Secondary | ICD-10-CM | POA: Diagnosis present

## 2019-05-17 DIAGNOSIS — Z90710 Acquired absence of both cervix and uterus: Secondary | ICD-10-CM | POA: Diagnosis not present

## 2019-05-17 DIAGNOSIS — Z96651 Presence of right artificial knee joint: Secondary | ICD-10-CM | POA: Diagnosis present

## 2019-05-17 DIAGNOSIS — Z79899 Other long term (current) drug therapy: Secondary | ICD-10-CM | POA: Diagnosis not present

## 2019-05-17 DIAGNOSIS — I50813 Acute on chronic right heart failure: Secondary | ICD-10-CM | POA: Diagnosis not present

## 2019-05-17 DIAGNOSIS — I472 Ventricular tachycardia: Secondary | ICD-10-CM | POA: Diagnosis not present

## 2019-05-17 DIAGNOSIS — J849 Interstitial pulmonary disease, unspecified: Secondary | ICD-10-CM | POA: Diagnosis not present

## 2019-05-17 DIAGNOSIS — Z7189 Other specified counseling: Secondary | ICD-10-CM | POA: Diagnosis not present

## 2019-05-17 DIAGNOSIS — T451X5A Adverse effect of antineoplastic and immunosuppressive drugs, initial encounter: Secondary | ICD-10-CM | POA: Diagnosis present

## 2019-05-17 DIAGNOSIS — G459 Transient cerebral ischemic attack, unspecified: Secondary | ICD-10-CM | POA: Diagnosis present

## 2019-05-17 DIAGNOSIS — J9600 Acute respiratory failure, unspecified whether with hypoxia or hypercapnia: Secondary | ICD-10-CM | POA: Diagnosis present

## 2019-05-17 DIAGNOSIS — Z85828 Personal history of other malignant neoplasm of skin: Secondary | ICD-10-CM

## 2019-05-17 DIAGNOSIS — I509 Heart failure, unspecified: Secondary | ICD-10-CM | POA: Diagnosis not present

## 2019-05-17 DIAGNOSIS — D509 Iron deficiency anemia, unspecified: Secondary | ICD-10-CM | POA: Diagnosis present

## 2019-05-17 DIAGNOSIS — I130 Hypertensive heart and chronic kidney disease with heart failure and stage 1 through stage 4 chronic kidney disease, or unspecified chronic kidney disease: Secondary | ICD-10-CM | POA: Diagnosis present

## 2019-05-17 DIAGNOSIS — I5023 Acute on chronic systolic (congestive) heart failure: Secondary | ICD-10-CM | POA: Diagnosis not present

## 2019-05-17 DIAGNOSIS — Z7951 Long term (current) use of inhaled steroids: Secondary | ICD-10-CM

## 2019-05-17 DIAGNOSIS — I471 Supraventricular tachycardia: Secondary | ICD-10-CM | POA: Diagnosis not present

## 2019-05-17 DIAGNOSIS — I5033 Acute on chronic diastolic (congestive) heart failure: Secondary | ICD-10-CM | POA: Diagnosis present

## 2019-05-17 DIAGNOSIS — Z515 Encounter for palliative care: Secondary | ICD-10-CM | POA: Diagnosis not present

## 2019-05-17 DIAGNOSIS — Z20822 Contact with and (suspected) exposure to covid-19: Secondary | ICD-10-CM | POA: Diagnosis present

## 2019-05-17 DIAGNOSIS — Z923 Personal history of irradiation: Secondary | ICD-10-CM

## 2019-05-17 DIAGNOSIS — Z9049 Acquired absence of other specified parts of digestive tract: Secondary | ICD-10-CM | POA: Diagnosis not present

## 2019-05-17 DIAGNOSIS — N1831 Chronic kidney disease, stage 3a: Secondary | ICD-10-CM | POA: Diagnosis present

## 2019-05-17 DIAGNOSIS — R0602 Shortness of breath: Secondary | ICD-10-CM

## 2019-05-17 DIAGNOSIS — E875 Hyperkalemia: Secondary | ICD-10-CM | POA: Diagnosis present

## 2019-05-17 DIAGNOSIS — Z96649 Presence of unspecified artificial hip joint: Secondary | ICD-10-CM | POA: Diagnosis present

## 2019-05-17 DIAGNOSIS — Z8673 Personal history of transient ischemic attack (TIA), and cerebral infarction without residual deficits: Secondary | ICD-10-CM | POA: Diagnosis not present

## 2019-05-17 DIAGNOSIS — R531 Weakness: Secondary | ICD-10-CM | POA: Diagnosis not present

## 2019-05-17 DIAGNOSIS — E785 Hyperlipidemia, unspecified: Secondary | ICD-10-CM | POA: Diagnosis present

## 2019-05-17 DIAGNOSIS — J9601 Acute respiratory failure with hypoxia: Secondary | ICD-10-CM | POA: Diagnosis not present

## 2019-05-17 DIAGNOSIS — I2723 Pulmonary hypertension due to lung diseases and hypoxia: Secondary | ICD-10-CM | POA: Diagnosis present

## 2019-05-17 DIAGNOSIS — C787 Secondary malignant neoplasm of liver and intrahepatic bile duct: Secondary | ICD-10-CM | POA: Diagnosis present

## 2019-05-17 DIAGNOSIS — M7989 Other specified soft tissue disorders: Secondary | ICD-10-CM | POA: Diagnosis not present

## 2019-05-17 DIAGNOSIS — J8410 Pulmonary fibrosis, unspecified: Secondary | ICD-10-CM | POA: Diagnosis present

## 2019-05-17 DIAGNOSIS — D630 Anemia in neoplastic disease: Secondary | ICD-10-CM | POA: Diagnosis present

## 2019-05-17 DIAGNOSIS — I110 Hypertensive heart disease with heart failure: Secondary | ICD-10-CM | POA: Diagnosis present

## 2019-05-17 DIAGNOSIS — L8990 Pressure ulcer of unspecified site, unspecified stage: Secondary | ICD-10-CM | POA: Insufficient documentation

## 2019-05-17 DIAGNOSIS — R0600 Dyspnea, unspecified: Secondary | ICD-10-CM

## 2019-05-17 DIAGNOSIS — N179 Acute kidney failure, unspecified: Secondary | ICD-10-CM | POA: Diagnosis present

## 2019-05-17 DIAGNOSIS — R739 Hyperglycemia, unspecified: Secondary | ICD-10-CM | POA: Diagnosis not present

## 2019-05-17 DIAGNOSIS — M810 Age-related osteoporosis without current pathological fracture: Secondary | ICD-10-CM | POA: Diagnosis present

## 2019-05-17 HISTORY — DX: Pulmonary fibrosis, unspecified: J84.10

## 2019-05-17 LAB — CBC
HCT: 28.2 % — ABNORMAL LOW (ref 36.0–46.0)
Hemoglobin: 8.7 g/dL — ABNORMAL LOW (ref 12.0–15.0)
MCH: 31.8 pg (ref 26.0–34.0)
MCHC: 30.9 g/dL (ref 30.0–36.0)
MCV: 102.9 fL — ABNORMAL HIGH (ref 80.0–100.0)
Platelets: 371 10*3/uL (ref 150–400)
RBC: 2.74 MIL/uL — ABNORMAL LOW (ref 3.87–5.11)
RDW: 17.9 % — ABNORMAL HIGH (ref 11.5–15.5)
WBC: 13.9 10*3/uL — ABNORMAL HIGH (ref 4.0–10.5)
nRBC: 0 % (ref 0.0–0.2)

## 2019-05-17 LAB — COMPREHENSIVE METABOLIC PANEL
ALT: 17 U/L (ref 0–44)
AST: 28 U/L (ref 15–41)
Albumin: 3.6 g/dL (ref 3.5–5.0)
Alkaline Phosphatase: 65 U/L (ref 38–126)
Anion gap: 9 (ref 5–15)
BUN: 42 mg/dL — ABNORMAL HIGH (ref 8–23)
CO2: 21 mmol/L — ABNORMAL LOW (ref 22–32)
Calcium: 9.4 mg/dL (ref 8.9–10.3)
Chloride: 107 mmol/L (ref 98–111)
Creatinine, Ser: 1.47 mg/dL — ABNORMAL HIGH (ref 0.44–1.00)
GFR calc Af Amer: 36 mL/min — ABNORMAL LOW (ref >60)
GFR calc non Af Amer: 31 mL/min — ABNORMAL LOW (ref >60)
Glucose, Bld: 139 mg/dL — ABNORMAL HIGH (ref 70–99)
Potassium: 5.5 mmol/L — ABNORMAL HIGH (ref 3.5–5.1)
Sodium: 137 mmol/L (ref 135–145)
Total Bilirubin: 1.1 mg/dL (ref 0.3–1.2)
Total Protein: 7.1 g/dL (ref 6.5–8.1)

## 2019-05-17 LAB — SARS CORONAVIRUS 2 (TAT 6-24 HRS): SARS Coronavirus 2: NEGATIVE

## 2019-05-17 LAB — LACTIC ACID, PLASMA: Lactic Acid, Venous: 0.7 mmol/L (ref 0.5–1.9)

## 2019-05-17 LAB — TROPONIN I (HIGH SENSITIVITY)
Troponin I (High Sensitivity): 6 ng/L (ref <18)
Troponin I (High Sensitivity): 6 ng/L (ref <18)

## 2019-05-17 LAB — BRAIN NATRIURETIC PEPTIDE: B Natriuretic Peptide: 957.7 pg/mL — ABNORMAL HIGH (ref 0.0–100.0)

## 2019-05-17 LAB — POC SARS CORONAVIRUS 2 AG -  ED: SARS Coronavirus 2 Ag: NEGATIVE

## 2019-05-17 MED ORDER — ALBUTEROL SULFATE HFA 108 (90 BASE) MCG/ACT IN AERS: 2.0000 | INHALATION_SPRAY | Freq: Four times a day (QID) | RESPIRATORY_TRACT | Status: DC | PRN

## 2019-05-17 MED ORDER — ONDANSETRON HCL 4 MG/2ML IJ SOLN: 4.0000 mg | Freq: Four times a day (QID) | INTRAMUSCULAR | Status: DC | PRN

## 2019-05-17 MED ORDER — CALCIUM CARBONATE-VITAMIN D 500-200 MG-UNIT PO TABS
1.0000 | ORAL_TABLET | Freq: Every day | ORAL | Status: DC
  Administered 2019-05-18 – 2019-05-24 (×7): 1 via ORAL
  Filled 2019-05-17 (×7): qty 1

## 2019-05-17 MED ORDER — HEPARIN SODIUM (PORCINE) 5000 UNIT/ML IJ SOLN
5000.0000 [IU] | Freq: Three times a day (TID) | INTRAMUSCULAR | Status: DC
  Administered 2019-05-17 – 2019-05-24 (×21): 5000 [IU] via SUBCUTANEOUS
  Filled 2019-05-17 (×21): qty 1

## 2019-05-17 MED ORDER — IPRATROPIUM-ALBUTEROL 0.5-2.5 (3) MG/3ML IN SOLN
3.0000 mL | Freq: Four times a day (QID) | RESPIRATORY_TRACT | Status: DC
  Administered 2019-05-18: 02:00:00 3 mL via RESPIRATORY_TRACT
  Filled 2019-05-17: qty 3

## 2019-05-17 MED ORDER — SODIUM CHLORIDE 0.9 % IV SOLN: 250.0000 mL | INTRAVENOUS | Status: DC | PRN

## 2019-05-17 MED ORDER — PROPRANOLOL HCL 20 MG PO TABS
20.0000 mg | ORAL_TABLET | Freq: Two times a day (BID) | ORAL | Status: DC
  Administered 2019-05-17 – 2019-05-20 (×7): 20 mg via ORAL
  Filled 2019-05-17 (×9): qty 1

## 2019-05-17 MED ORDER — FUROSEMIDE 10 MG/ML IJ SOLN
40.0000 mg | Freq: Once | INTRAMUSCULAR | Status: AC
  Administered 2019-05-17: 16:00:00 40 mg via INTRAVENOUS
  Filled 2019-05-17: qty 4

## 2019-05-17 MED ORDER — FLUTICASONE FUROATE-VILANTEROL 100-25 MCG/INH IN AEPB
1.0000 | INHALATION_SPRAY | Freq: Every day | RESPIRATORY_TRACT | Status: DC
  Administered 2019-05-19 – 2019-05-24 (×6): 1 via RESPIRATORY_TRACT
  Filled 2019-05-17: qty 28

## 2019-05-17 MED ORDER — LOPERAMIDE HCL 2 MG PO CAPS: 2.0000 mg | ORAL_CAPSULE | Freq: Four times a day (QID) | ORAL | Status: DC | PRN

## 2019-05-17 MED ORDER — GABAPENTIN 300 MG PO CAPS
300.0000 mg | ORAL_CAPSULE | Freq: Three times a day (TID) | ORAL | Status: DC
  Administered 2019-05-17 – 2019-05-19 (×7): 300 mg via ORAL
  Filled 2019-05-17 (×7): qty 1

## 2019-05-17 MED ORDER — HYDRALAZINE HCL 20 MG/ML IJ SOLN
10.0000 mg | Freq: Four times a day (QID) | INTRAMUSCULAR | Status: DC | PRN
  Administered 2019-05-19: 10 mg via INTRAVENOUS
  Filled 2019-05-17: qty 1

## 2019-05-17 MED ORDER — FUROSEMIDE 10 MG/ML IJ SOLN
40.0000 mg | Freq: Two times a day (BID) | INTRAMUSCULAR | Status: DC
  Administered 2019-05-17: 22:00:00 40 mg via INTRAVENOUS
  Filled 2019-05-17: qty 4

## 2019-05-17 MED ORDER — LEVOTHYROXINE SODIUM 50 MCG PO TABS
150.0000 ug | ORAL_TABLET | Freq: Every day | ORAL | Status: DC
  Administered 2019-05-18 – 2019-05-24 (×7): 150 ug via ORAL
  Filled 2019-05-17 (×2): qty 2
  Filled 2019-05-17 (×5): qty 1

## 2019-05-17 MED ORDER — LATANOPROST 0.005 % OP SOLN
1.0000 [drp] | Freq: Every day | OPHTHALMIC | Status: DC
  Administered 2019-05-18 – 2019-05-23 (×6): 1 [drp] via OPHTHALMIC
  Filled 2019-05-17 (×2): qty 2.5

## 2019-05-17 MED ORDER — SODIUM CHLORIDE 0.9% FLUSH
3.0000 mL | Freq: Two times a day (BID) | INTRAVENOUS | Status: DC
  Administered 2019-05-17 – 2019-05-23 (×7): 3 mL via INTRAVENOUS

## 2019-05-17 MED ORDER — PREDNISONE 10 MG PO TABS: ORAL_TABLET | ORAL | 0 refills | Status: DC

## 2019-05-17 MED ORDER — SODIUM CHLORIDE 0.9% FLUSH: 3.0000 mL | INTRAVENOUS | Status: DC | PRN

## 2019-05-17 MED ORDER — PANTOPRAZOLE SODIUM 40 MG PO TBEC
40.0000 mg | DELAYED_RELEASE_TABLET | Freq: Every day | ORAL | Status: DC
  Administered 2019-05-18 – 2019-05-24 (×7): 40 mg via ORAL
  Filled 2019-05-17 (×7): qty 1

## 2019-05-17 MED ORDER — DICYCLOMINE HCL 20 MG PO TABS
20.0000 mg | ORAL_TABLET | Freq: Three times a day (TID) | ORAL | Status: DC
  Administered 2019-05-18 – 2019-05-24 (×18): 20 mg via ORAL
  Filled 2019-05-17 (×24): qty 1

## 2019-05-17 MED ORDER — ATORVASTATIN CALCIUM 20 MG PO TABS
20.0000 mg | ORAL_TABLET | Freq: Every day | ORAL | Status: DC
  Administered 2019-05-18 – 2019-05-24 (×7): 20 mg via ORAL
  Filled 2019-05-17 (×2): qty 1
  Filled 2019-05-17: qty 2
  Filled 2019-05-17 (×2): qty 1
  Filled 2019-05-17: qty 2
  Filled 2019-05-17: qty 1

## 2019-05-17 NOTE — ED Triage Notes (Signed)
EMS reports from Home, Dx with Pulmonary Fibrosis 3 weeks ago. Increasing SOB. Dx with inhaler without instruction Pt non compliant due to not knowing how to use.  BP 147/68 RR 16 HR 73 Temp 97.4 Sp02 on scene 70 (Spo2 96 on 4 lts nasal cannula after treatments)  Given two puffs of her inhaler at scene 61m Albuterol 0.573mAtrovent neb enroute

## 2019-05-17 NOTE — H&P (Signed)
History and Physical        Hospital Admission Note Date: 05/17/2019  Patient name: Summer Nielsen Medical record number: [id number] Date of birth: 1927-07-02 Age: 86 y.o. Gender: female  PCP: Maurice Small, MD  Primary pulmonologist: Dr. Vaughan Browner Primary cardiologist: Dr. Candee Furbish  Patient coming from: Home  I have reviewed all records in the [facility].    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  Patient reported that she was taken off of Lasix and losartan by pulmonology and oncology for contrasted CT scan imagings.  Patient has not had her Lasix for 1 week.  She reported getting short of breath with minimal exertion, swelling in her lower extremities, orthopnea.  Patient ported that her oxygen saturation were in 70s at home.  Patient had a televisit with pulmonology office today and was recommended to go to the ED.  EMS noted O2 sats 70% and patient was placed on 4 L, at baseline on 2 L at home.  Patient also received albuterol nebs en route. At the time of encounter, patient visibly short of breath, on 5 L O2, winded during conversation.  Reported no fevers or chills however has some productive cough.  No nausea vomiting or abdominal pain.  Reports having off-and-on chronic diarrhea. Reports no Covid exposures, had Covid vaccine x 2, second dose on February 26.   ED work-up/course:  In ED, temp 97.3, respiratory rate 20, pulse 79, BP 154/77, O2 sats 94% on 4 L Patient received Lasix 40 mg IV x1 in ED Sodium 137, potassium 5.5, creatinine 1.4, BNP 957, troponin 6 WBCs 13.9, hemoglobin 8.7, platelets 371  Chest x-ray showed  increasing vascular congestion and parenchymal edema   Review of Systems: Positives marked in 'bold' Constitutional: Denies fever, chills, diaphoresis, poor appetite and fatigue.  HEENT: Denies photophobia, eye pain, redness, hearing loss, ear pain, congestion, sore throat, rhinorrhea, sneezing, mouth sores, trouble swallowing, neck pain, neck stiffness and tinnitus.   Respiratory: Please see HPI Cardiovascular: Please see HPI Gastrointestinal: Denies nausea, vomiting, abdominal pain, diarrhea, constipation, blood in stool and abdominal distention.  Genitourinary: Denies dysuria, urgency, frequency, hematuria, flank pain and difficulty urinating.  Musculoskeletal: Denies myalgias, back pain, joint swelling, arthralgias and gait problem.  Skin: Denies pallor, rash and wound.  Neurological: Denies dizziness, seizures, syncope, weakness, light-headedness, numbness and headaches.  Hematological: Denies adenopathy. Easy bruising, personal or family bleeding history  Psychiatric/Behavioral: Denies suicidal ideation, mood changes, confusion, nervousness, sleep disturbance and agitation  Past Medical History: Past Medical History:  Diagnosis Date  . Cancer ([place])    squamous cell skin cancer on left leg-removed  . CHF (congestive heart failure) ([place])   . Cholangiocarcinoma ([place]) dx'd 11/2017  . Complication of anesthesia   . GERD (gastroesophageal reflux disease)   . Glaucoma   . High blood pressure   . Hyperlipidemia   . Hypothyroidism   . OP (osteoporosis)   . PONV (postoperative nausea and vomiting)   . Postsurgical hypothyroidism   .  Pulmonary fibrosis ([place])   . 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 Tachycardia   . TIA (transient ischemic attack)     Past Surgical History:  Procedure Laterality Date  . ABDOMINAL HYSTERECTOMY    . BACK SURGERY N/A 03/2017   Lumbar  . CHOLECYSTECTOMY    . EYE SURGERY     removed cateracts on both eyes  . IR  IMAGING GUIDED PORT INSERTION  09/06/2018  . JOINT REPLACEMENT     3 hip replacement on left, right knee replacement  . THYROIDECTOMY      Medications: Prior to Admission medications   Medication Sig Start Date End Date Taking? Authorizing Provider  acetaminophen (TYLENOL) 500 MG tablet Take 1,000 mg by mouth daily as needed for moderate pain or headache.   Yes [provider]  amLODipine (NORVASC) 5 MG tablet Take 5 mg by mouth daily.   Yes [provider]  atorvastatin (LIPITOR) 20 MG tablet Take 20 mg by mouth daily. 03/05/19  Yes [provider]  bimatoprost (LUMIGAN) 0.01 % SOLN Place 1 drop into both eyes at bedtime.    Yes [provider]  calcium-vitamin D (OSCAL WITH D) 500-200 MG-UNIT tablet Take 1 tablet by mouth daily.    Yes [provider]  fluticasone furoate-vilanterol (BREO ELLIPTA) 100-25 MCG/INH AEPB Inhale 1 puff into the lungs daily. 05/02/19  Yes Mannam, Praveen, MD  furosemide (LASIX) 40 MG tablet Take 40-60 mg by mouth See admin instructions. Take 60 mg in the AM and 40 mg after lunch   Yes [provider]  gabapentin (NEURONTIN) 300 MG capsule TAKE 1 CAPSULE BY MOUTH THREE TIMES A DAY Patient taking differently: Take 300 mg by mouth 3 (three) times daily.  12/12/17  Yes Magnus Sinning, MD  ibuprofen (ADVIL,MOTRIN) 200 MG tablet Take 200 mg by mouth every 6 (six) hours as needed for moderate pain (back surgery).   Yes [provider]  Iron-FA-B Cmp-C-Biot-Probiotic (FUSION PLUS) CAPS Take 1 capsule by mouth daily. 04/14/17  Yes Jessy Oto, MD  levothyroxine (SYNTHROID, LEVOTHROID) 150 MCG tablet Take 150 mcg by mouth daily. 06/01/18  Yes [provider]  loperamide (IMODIUM) 2 MG capsule Take 2 capsules by mouth 4 (four) times daily as needed.   Yes [provider]  losartan (COZAAR) 50 MG tablet Take 50 mg by mouth daily.   Yes [provider]  omeprazole (PRILOSEC) 40 MG capsule  Take 40 mg by mouth daily before breakfast.  10/04/18  Yes [provider]  propranolol (INDERAL) 20 MG tablet Take 20 mg by mouth 2 (two) times daily.    Yes [provider]  albuterol (VENTOLIN HFA) 108 (90 Base) MCG/ACT inhaler Inhale 2 puffs into the lungs every 6 (six) hours as needed for wheezing or shortness of breath. Patient not taking: Reported on 05/17/2019 05/16/19   Martyn Ehrich, NP  dicyclomine (BENTYL) 20 MG tablet Take 20 mg by mouth 3 (three) times daily. 04/12/19   [provider]  HYDROmorphone (DILAUDID) 2 MG tablet Take 0.5-1 tablets (1-2 mg total) by mouth every 4 (four) hours as needed for severe pain. Patient not taking: Reported on 05/17/2019 12/08/18   Ladell Pier, MD  KLOR-CON M10 10 MEQ tablet TAKE 2 TABLETS EVERY DAY Patient not taking: No sig reported 03/05/19   Ladell Pier, MD  lidocaine-prilocaine (EMLA) cream Apply 1 application topically as directed. Patient not taking: Reported on 05/17/2019 09/01/18  Ladell Pier, MD  predniSONE (DELTASONE) 10 MG tablet Take 2 tabs x 5 days 05/17/19   Martyn Ehrich, NP    Allergies:   Allergies  Allergen Reactions  . Levofloxacin Nausea Only    Dizzy, hot, foot pain, insomnia  . Percocet [Oxycodone-Acetaminophen] Nausea And Vomiting and Other (See Comments)    sick    Social History:  reports that she has never smoked. She has never used smokeless tobacco. She reports that she does not drink alcohol or use drugs.  Family History: Family History  Problem Relation Age of Onset  . Heart attack Mother   . Cerebral aneurysm Mother   . Stroke Mother   . Hypertension Mother   . Heart attack Father   . Heart disease Sister   . Heart disease Brother     Physical Exam: Blood pressure (!) 154/77, pulse 83, temperature (!) 97.3 F (36.3 C), resp. rate (!) 23, SpO2 91 %. General: Alert, awake, oriented x3, in mild respiratory distress, winded during conversation Eyes: pink  conjunctiva,anicteric sclera, pupils equal and reactive to light and accomodation, HEENT: normocephalic, atraumatic, oropharynx clear Neck: supple, no masses or lymphadenopathy, no goiter, no bruits,+ JVD CVS: Regular rate and rhythm, without murmurs, rubs or gallops.2+ lower extremity edema Resp : Bilateral rales, has scattered expiratory wheezing GI : Soft, nontender, nondistended, positive bowel sounds, no masses. No hepatomegaly. No hernia.  Musculoskeletal: No clubbing or cyanosis, positive pedal pulses. No contracture. ROM intact  Neuro: Grossly intact, no focal neurological deficits, strength 5/5 upper and lower extremities bilaterally Psych: alert and oriented x 3, normal mood and affect Skin: no rashes or lesions, warm and dry   LABS on Admission: I have personally reviewed all the labs and imagings below    Basic Metabolic Panel: Recent Labs  Lab 05/16/19 1040 05/17/19 1529  NA 139 137  K 5.2* 5.5*  CL 108 107  CO2 22 21*  GLUCOSE 126* 139*  BUN 41* 42*  CREATININE 1.46* 1.47*  CALCIUM 9.2 9.4   Liver Function Tests: Recent Labs  Lab 05/17/19 1529  AST 28  ALT 17  ALKPHOS 65  BILITOT 1.1  PROT 7.1  ALBUMIN 3.6   No results for input(s): LIPASE, AMYLASE in the last 168 hours. No results for input(s): AMMONIA in the last 168 hours. CBC: Recent Labs  Lab 05/17/19 1529  WBC 13.9*  HGB 8.7*  HCT 28.2*  MCV 102.9*  PLT 371   Cardiac Enzymes: No results for input(s): CKTOTAL, CKMB, CKMBINDEX, TROPONINI in the last 168 hours. BNP: Invalid input(s): POCBNP CBG: No results for input(s): GLUCAP in the last 168 hours.  Radiological Exams on Admission:  DG Chest Port 1 View  Result Date: 05/17/2019 CLINICAL DATA:  Shortness of breath EXAM: PORTABLE CHEST 1 VIEW COMPARISON:  03/13/2019 FINDINGS: Cardiac shadow is enlarged but stable. Aortic calcifications are seen. Right chest wall port is noted. Increasing vascular congestion is noted with parenchymal edema  worse on the right than the left. No bony abnormality is seen. IMPRESSION: Increasing vascular congestion and parenchymal edema. Electronically Signed   By: Inez Catalina M.D.   On: 05/17/2019 15:43      EKG: Independently reviewed.  No new EKG available   Assessment/Plan Principal Problem:   Acute respiratory failure (HCC) with hypoxia likely secondary to  Acute on chronic diastolic CHF (congestive heart failure) (HCC) -Likely due to missing of Lasix at home, elevated BNP,+ JVD, orthopnea,, + lower extremity edema showed pulmonary edema -  Placed on IV Lasix 40 mg every 12 hours, hold losartan due to renal insufficiency and amlodipine -For now, will continue propranolol (outpatient medication) -Strict I's and O's and daily weights, will consult cardiology (follows Dr. Candee Furbish) - repeat 2D echo, last echo 10/2018 had shown EF of 60 to 65%   Active Problems:  history of TIA (transient ischemic attack) -Continue statin, does not appear to be on aspirin and Plavix    Postsurgical hypothyroidism -Obtain TSH, continue Synthroid    Hyperlipidemia -Continue statin    Hypertensive heart disease with heart failure (HCC) -Continue Lasix, propranolol, hydralazine IV as needed with parameters     Cholangiocarcinoma metastatic to liver [facility]), history of pulmonary fibrosis -Completed chemotherapy on 04/11/2019, was scheduled to have high-resolution chest CT outpatient for staging -Patient follow-up with Dr. Benay Spice, she has a office visit scheduled on 05/22/2019  Hyperkalemia Hold potassium supplementation, patient has received 1 dose of IV Lasix, will recheck bmet -If potassium still elevated, will order Lokelma.  Hold losartan  History of C. difficile in 2020 -Patient states she has off-and-on diarrhea, no increase, no abdominal pain, fever chills   DVT prophylaxis: Heparin subcu  CODE STATUS: Full CODE STATUS, addressed with the patient  Consults called: Cardiology  Family  Communication: Admission, patients condition and plan of care including tests being ordered have been discussed with the patient who indicates understanding and agree with the plan and Code Status  Admission status:   The medical decision making on this patient was of high complexity and the patient is at high risk for clinical deterioration, therefore this is a level 3 admission.  Severity of Illness:      The appropriate patient status for this patient is INPATIENT. Inpatient status is judged to be reasonable and necessary in order to provide the required intensity of service to ensure the patient's safety. The patient's presenting symptoms, physical exam findings, and initial radiographic and laboratory data in the context of their chronic comorbidities is felt to place them at high risk for further clinical deterioration. Furthermore, it is not anticipated that the patient will be medically stable for discharge from the hospital within 2 midnights of admission. The following factors support the patient status of inpatient.   " The patient's presenting symptoms include acute respiratory distress, hypoxia, pulmonary edema, lower extremity edema " The worrisome physical exam findings include shortness of breath, hypoxia, lower extremity edema " The initial radiographic and laboratory data are worrisome because of elevated BNP, chest x-ray with pulmonary edema " The chronic co-morbidities include history of CHF   * I certify that at the point of admission it is my clinical judgment that the patient will require inpatient hospital care spanning beyond 2 midnights from the point of admission due to high intensity of service, high risk for further deterioration and high frequency of surveillance required.*    Time Spent on Admission: 60-minutes     Zeth Buday M.D. [facility] 05/17/2019, 6:04 PM

## 2019-05-17 NOTE — Progress Notes (Addendum)
Virtual Visit via Telephone Note  I connected with Summer Nielsen on 05/17/19 at 10:30 AM EST by telephone and verified that I am speaking with the correct person using two identifiers.  Location: Patient: Home Provider: Office   I discussed the limitations, risks, security and privacy concerns of performing an evaluation and management service by telephone and the availability of in person appointments. I also discussed with the patient that there may be a patient responsible charge related to this service. The patient expressed understanding and agreed to proceed.   History of Present Illness: 86 year old female, never smoked. 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 Patient of Dr. Vaughan Browner, last seen on 05/02/19. Originally referred for progressive dyspnea. Ordered for CTD serologies, HRCT, PFTs. Started on Breo 100.   05/17/2019 Patient contacted today for acute televisit. Reports increased shortness of breath over the last week with associated leg swelling, wheezing and cough. She has productive cough with yellow mucus. Oncology took her off losartan and lasix until CT scan completed and then she can restart. She is getting HRCT and CT abdomen today. Reports compliance with Breo 100, she has not noticed any improvement since starting. Sent in prescription for Albuterol rescue inhaler yesterday evening which she has not picked up. She is on 3L of oxygen. She does not have pulse oximeter at home.   Observations/Objectiv  - Short of breath with wheezing  - Son called back after televisit and patient's O2 is 74% on 3L   Data Reviewed: Imaging: CT high-resolution 09/07/2017-anterior liver mass, patchy subpleural and bronchovascular reticulation with groundglass, minimal traction bronchiectasis with air trapping.  Indeterminate for UIP  CT chest 02/16/2019-multiple liver lesions, patchy groundglass attenuation,  septal thickening with reticulation and bronchiectasis. Air trapping.  Indeterminate for UIP.  Labs: 08/31/2017 hypersensitivity panel-negative 6/26/9 ANA negative, rheumatoid factor less than 14, CCP 30  Cardiac: Echocardiogram 10/19/2018-LVEF 60 to 65%, normal RV systolic function.   Assessment and Plan:  Dyspnea: - Reports increased shortness of breath x 1 week  - She is being worked up for ILD with pulmonary/Dr. Vaughan Browner- due for HRCT/CT abdomen today  - Patient was told to hold her diuretic several days before CT scan by oncology d/t elevated creatinine level  - Recommend resuming Lasix after testing today - PFTs scheduled for May 7th - Next follow-up with Dr. Vaughan Browner April 20th  Acute on Chronic respiratory failure - D/t acute symptoms of shortness of breath and wheeizng advised patient present to ED. Likely acute CHF exacerbation - O2 74% on 3L  - Rx prednisone 55m x 5 days  Follow Up Instructions:  - Advised ED evaluation d/t hypoxemia    I discussed the assessment and treatment plan with the patient. The patient was provided an opportunity to ask questions and all were answered. The patient agreed with the plan and demonstrated an understanding of the instructions.   The patient was advised to call back or seek an in-person evaluation if the symptoms worsen or if the condition fails to improve as anticipated.  I provided 22 minutes of non-face-to-face time during this encounter.   EMartyn Ehrich NP

## 2019-05-17 NOTE — Patient Instructions (Signed)
Recommend checking O2 level and call office if <90% Resume lasix after CT imaging Advised ED eval for hypoxemia

## 2019-05-17 NOTE — ED Notes (Signed)
Kimba Lottes, son, [phone] to know if you needed any information to call him and call with any updates.

## 2019-05-17 NOTE — Telephone Encounter (Signed)
Called and spoke with pt's son Elta Guadeloupe who stated they went over to pt's house with a pulse ox and pt was satting at 73%. Elta Guadeloupe stated when he was trying to get pt from a wheelchair to put her in the car to take her to the hospital she became very pale and passed out. He stated that he ended up calling EMS and they came and took her to [facility].  Elta Guadeloupe stated that he is waiting on a call from the ER about pt's status and also in regards to what they are going to do.  Routing to [employer] as an Pharmacist, hospital.

## 2019-05-17 NOTE — Telephone Encounter (Signed)
Called spoke with patient's son Elta Guadeloupe - was informed by Elta Guadeloupe that patient had a telephone visit with [facility] NP earlier this morning and was informed by Eustaquio Maize to check her O2 and call the office back.  Mark took oximeter to patient's home.  Reading is 74% - fingers are warm.  Dyspnea is unchanged since speaking with Beth but does note some wheezing.  Advised ER for immediate evaluation Eustaquio Maize is aware and in agreement  Elta Guadeloupe to take patient to [facility] ER Nothing further needed; will sign off and route to Dr Vaughan Browner to make him aware.

## 2019-05-17 NOTE — ED Provider Notes (Signed)
[facility] Emergency Department Provider Note MRN:  [id number]  Arrival date & time: 05/17/19     Chief Complaint   Shortness of Breath   History of Present Illness   Summer Nielsen is a 86 y.o. year-old female with a history of pulmonary fibrosis, CHF, presenting to the ED with chief complaint of proximal.  Patient had worsening shortness of breath last week, gradual onset, also improving respiratory edema.  Taking off of her Lasix.  No chest pain, no abdominal pain, no fever, mild cough, no vomiting, mild diarrhea.  Reportedly hypoxic upon EMS arrival.  Review of Systems  A complete 10 system review of systems was obtained and all systems are negative except as noted in the HPI and PMH.   Patient's Health History    Past Medical History:  Diagnosis Date  . Cancer ([place])    squamous cell skin cancer on left leg-removed  . CHF (congestive heart failure) ([place])   . 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 Complication of anesthesia   . GERD (gastroesophageal reflux disease)   . Glaucoma   . High blood pressure   . Hyperlipidemia   . Hypothyroidism   . OP (osteoporosis)   . PONV (postoperative nausea and vomiting)   . Postsurgical hypothyroidism   . Pulmonary fibrosis ([place])   . 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 Tachycardia   . TIA (transient ischemic attack)     Past Surgical History:  Procedure Laterality Date  . ABDOMINAL HYSTERECTOMY    . BACK SURGERY N/A 03/2017   Lumbar  . CHOLECYSTECTOMY    . EYE SURGERY     removed cateracts on both eyes  . IR IMAGING GUIDED PORT INSERTION  09/06/2018  . JOINT REPLACEMENT     3 hip replacement on left, right knee replacement  . THYROIDECTOMY      Family History  Problem Relation Age of Onset  . Heart attack Mother   . Cerebral aneurysm Mother   . Stroke Mother   . Hypertension Mother   . Heart attack Father   . Heart disease Sister   . Heart disease Brother      Social History   Socioeconomic History  . Marital status: Married    Spouse name: Not on file  . Number of children: Not on file  . Years of education: Not on file  . Highest education level: Not on file  Occupational History  . Not on file  Tobacco Use  . Smoking status: Never Smoker  . Smokeless tobacco: Never Used  Substance and Sexual Activity  . Alcohol use: No  . Drug use: No  . Sexual activity: Not on file  Other Topics Concern  . Not on file  Social History Narrative  . Not on file   Social Determinants of Health   Financial Resource Strain:   . Difficulty of Paying Living Expenses:   Food Insecurity:   . Worried About Charity fundraiser in the Last Year:   . Arboriculturist in the Last Year:   Transportation Needs:   . Film/video editor (Medical):   Marland Kitchen Lack of Transportation (Non-Medical):   Physical Activity:   . Days of Exercise per Week:   . Minutes of Exercise per Session:   Stress:   . Feeling of Stress :   Social Connections:   . Frequency of Communication with Friends and Family:   . Frequency of Social Gatherings with Friends  and Family:   . Attends Religious Services:   . Active Member of Clubs or Organizations:   . Attends Archivist Meetings:   Marland Kitchen Marital Status:   Intimate Partner Violence:   . Fear of Current or Ex-Partner:   . Emotionally Abused:   Marland Kitchen Physically Abused:   . Sexually Abused:      Physical Exam   Vitals:   05/17/19 1645 05/17/19 1700  BP:  (!) 132/55  Pulse: 65 65  Resp: 17 17  Temp:    SpO2: 96% 97%    CONSTITUTIONAL:  Chronically ill-appearing, NAD NEURO:  Alert and oriented x 3, no focal deficits EYES:  eyes equal and reactive ENT/NECK:  no LAD, moderate JVD CARDIO:  regular rate, well-perfused, normal S1 and S2 PULM:  Upper airway wheezing, scattered crackles, mildly tachypneic GI/GU:  normal bowel sounds, non-distended, non-tender MSK/SPINE:  No gross deformities, 2+ LE edema BL SKIN:  no  rash, atraumatic PSYCH:  Appropriate speech and behavior  *Additional and/or pertinent findings included in MDM below  Diagnostic and Interventional Summary    EKG Interpretation  Date/Time:  05-17-2019 at 14: 18: 42 Ventricular Rate:  78 PR Interval:  179 QRS Duration: 98 QT Interval:  399 QTC Calculation: 455 R Axis:     Text Interpretation: Sinus rhythm, normal intervals Confirm by Dr. Gerlene Fee at [facility] - Abnormal; Notable for the following components:      Result Value   B Natriuretic Peptide 957.7 (*)    All other components within normal limits  CBC - Abnormal; Notable for the following components:   WBC 13.9 (*)    RBC 2.74 (*)    Hemoglobin 8.7 (*)    HCT 28.2 (*)    MCV 102.9 (*)    RDW 17.9 (*)    All other components within normal limits  COMPREHENSIVE METABOLIC PANEL - Abnormal; Notable for the following components:   Potassium 5.5 (*)    CO2 21 (*)    Glucose, Bld 139 (*)    BUN 42 (*)    Creatinine, Ser 1.47 (*)    GFR calc non Af Amer 31 (*)    GFR calc Af Amer 36 (*)    All other components within normal limits  SARS CORONAVIRUS 2 (TAT 6-24 HRS)  LACTIC ACID, PLASMA  POC SARS CORONAVIRUS 2 AG -  ED  TROPONIN I (HIGH SENSITIVITY)  TROPONIN I (HIGH SENSITIVITY)    DG Chest Port 1 View  Final Result      Medications  furosemide (LASIX) injection 40 mg (40 mg Intravenous Given 05/17/19 1535)     Procedures  /  Critical Care .Critical Care Performed by: Maudie Flakes, MD Authorized by: Maudie Flakes, MD   Critical care provider statement:    Critical care time (minutes):  32   Critical care was necessary to treat or prevent imminent or life-threatening deterioration of the following conditions:  Respiratory failure   Critical care was time spent personally by me on the following activities:  Discussions with consultants, evaluation of patient's response to treatment, examination of patient,  ordering and performing treatments and interventions, ordering and review of laboratory studies, ordering and review of radiographic studies, pulse oximetry, re-evaluation of patient's condition, obtaining history from patient or surrogate and review of old charts    ED Course and Medical Decision Making  I have reviewed the triage vital signs, the nursing  notes, and pertinent available records from the EMR.  Pertinent labs & imaging results that were available during my care of the patient were reviewed by me and considered in my medical decision making (see below for details).     Suspect CHF as reason for hypoxic respiratory failure given edema and JVD on exam, timing with regard to lasix.  Awaiting labs, CXR.  5:40 PM update: Chest x-ray showing edema, BNP elevated, admitted to hospital service for further management of CHF exacerbation.  Barth Kirks. [place] Small, [place] [email or web address]  Final Clinical Impressions(s) / ED Diagnoses     ICD-10-CM   1. Acute on chronic congestive heart failure, unspecified heart failure type ([place])  I50.9   2. Acute respiratory failure with hypoxia (HCC)  J96.01     ED Discharge Orders    None       Discharge Instructions Discussed with and Provided to Patient:   Discharge Instructions   None       Maudie Flakes, MD 05/17/19 1743

## 2019-05-17 NOTE — ED Notes (Signed)
Pure wick has been placed. Suction set to 45mmHg.  

## 2019-05-18 ENCOUNTER — Inpatient Hospital Stay (HOSPITAL_COMMUNITY): Payer: Medicare PPO

## 2019-05-18 DIAGNOSIS — I5023 Acute on chronic systolic (congestive) heart failure: Secondary | ICD-10-CM

## 2019-05-18 DIAGNOSIS — I509 Heart failure, unspecified: Secondary | ICD-10-CM

## 2019-05-18 LAB — IRON AND TIBC
Iron: 28 ug/dL (ref 28–170)
Saturation Ratios: 9 % — ABNORMAL LOW (ref 10.4–31.8)
TIBC: 320 ug/dL (ref 250–450)
UIBC: 292 ug/dL

## 2019-05-18 LAB — BASIC METABOLIC PANEL
Anion gap: 11 (ref 5–15)
BUN: 41 mg/dL — ABNORMAL HIGH (ref 8–23)
CO2: 21 mmol/L — ABNORMAL LOW (ref 22–32)
Calcium: 9 mg/dL (ref 8.9–10.3)
Chloride: 105 mmol/L (ref 98–111)
Creatinine, Ser: 1.54 mg/dL — ABNORMAL HIGH (ref 0.44–1.00)
GFR calc Af Amer: 34 mL/min — ABNORMAL LOW (ref >60)
GFR calc non Af Amer: 29 mL/min — ABNORMAL LOW (ref >60)
Glucose, Bld: 104 mg/dL — ABNORMAL HIGH (ref 70–99)
Potassium: 4.7 mmol/L (ref 3.5–5.1)
Sodium: 137 mmol/L (ref 135–145)

## 2019-05-18 LAB — RETICULOCYTES
Immature Retic Fract: 36.7 % — ABNORMAL HIGH (ref 2.3–15.9)
RBC.: 2.54 MIL/uL — ABNORMAL LOW (ref 3.87–5.11)
Retic Count, Absolute: 123.4 10*3/uL (ref 19.0–186.0)
Retic Ct Pct: 4.9 % — ABNORMAL HIGH (ref 0.4–3.1)

## 2019-05-18 LAB — TSH: TSH: 4.626 u[IU]/mL — ABNORMAL HIGH (ref 0.350–4.500)

## 2019-05-18 LAB — CBC
HCT: 24.9 % — ABNORMAL LOW (ref 36.0–46.0)
Hemoglobin: 7.5 g/dL — ABNORMAL LOW (ref 12.0–15.0)
MCH: 31 pg (ref 26.0–34.0)
MCHC: 30.1 g/dL (ref 30.0–36.0)
MCV: 102.9 fL — ABNORMAL HIGH (ref 80.0–100.0)
Platelets: 304 10*3/uL (ref 150–400)
RBC: 2.42 MIL/uL — ABNORMAL LOW (ref 3.87–5.11)
RDW: 18 % — ABNORMAL HIGH (ref 11.5–15.5)
WBC: 12.2 10*3/uL — ABNORMAL HIGH (ref 4.0–10.5)
nRBC: 0.2 % (ref 0.0–0.2)

## 2019-05-18 LAB — FOLATE: Folate: 70.9 ng/mL (ref >5.9)

## 2019-05-18 LAB — FERRITIN: Ferritin: 186 ng/mL (ref 11–307)

## 2019-05-18 LAB — ECHOCARDIOGRAM COMPLETE

## 2019-05-18 LAB — VITAMIN B12: Vitamin B-12: 416 pg/mL (ref 180–914)

## 2019-05-18 LAB — MRSA PCR SCREENING: MRSA by PCR: NEGATIVE

## 2019-05-18 LAB — MAGNESIUM: Magnesium: 2.5 mg/dL — ABNORMAL HIGH (ref 1.7–2.4)

## 2019-05-18 LAB — LACTATE DEHYDROGENASE: LDH: 301 U/L — ABNORMAL HIGH (ref 98–192)

## 2019-05-18 MED ORDER — ACETAMINOPHEN 325 MG PO TABS
650.0000 mg | ORAL_TABLET | ORAL | Status: DC | PRN
  Administered 2019-05-18 – 2019-05-24 (×4): 650 mg via ORAL
  Filled 2019-05-18 (×4): qty 2

## 2019-05-18 MED ORDER — CHLORHEXIDINE GLUCONATE CLOTH 2 % EX PADS
6.0000 | MEDICATED_PAD | Freq: Every day | CUTANEOUS | Status: DC
  Administered 2019-05-18 – 2019-05-23 (×6): 6 via TOPICAL

## 2019-05-18 MED ORDER — CHLORHEXIDINE GLUCONATE 0.12 % MT SOLN
15.0000 mL | Freq: Two times a day (BID) | OROMUCOSAL | Status: DC
  Administered 2019-05-18 – 2019-05-24 (×12): 15 mL via OROMUCOSAL
  Filled 2019-05-18 (×11): qty 15

## 2019-05-18 MED ORDER — IPRATROPIUM-ALBUTEROL 0.5-2.5 (3) MG/3ML IN SOLN
3.0000 mL | Freq: Three times a day (TID) | RESPIRATORY_TRACT | Status: DC
  Administered 2019-05-18 – 2019-05-19 (×5): 3 mL via RESPIRATORY_TRACT
  Filled 2019-05-18 (×5): qty 3

## 2019-05-18 MED ORDER — FUROSEMIDE 10 MG/ML IJ SOLN
80.0000 mg | Freq: Two times a day (BID) | INTRAMUSCULAR | Status: DC
  Administered 2019-05-18 – 2019-05-20 (×5): 80 mg via INTRAVENOUS
  Filled 2019-05-18 (×5): qty 8

## 2019-05-18 MED ORDER — ORAL CARE MOUTH RINSE
15.0000 mL | Freq: Two times a day (BID) | OROMUCOSAL | Status: DC
  Administered 2019-05-19 – 2019-05-24 (×9): 15 mL via OROMUCOSAL

## 2019-05-18 MED ORDER — SODIUM CHLORIDE 0.9% FLUSH
10.0000 mL | Freq: Two times a day (BID) | INTRAVENOUS | Status: DC
  Administered 2019-05-18 – 2019-05-23 (×8): 10 mL

## 2019-05-18 MED ORDER — SODIUM CHLORIDE 0.9% FLUSH: 10.0000 mL | INTRAVENOUS | Status: DC | PRN

## 2019-05-18 NOTE — Progress Notes (Signed)
  Echocardiogram 2D Echocardiogram has been performed.  Summer Nielsen 05/18/2019, 9:02 AM

## 2019-05-18 NOTE — ED Notes (Signed)
Went to patient's room at 0140 to move from stretcher to a more comfortable hospital bed. Patient was tripod in bed saying "help me, I can't breathe." Patient's breathing was labored, color pale and diaphoretic, oxygen saturation of 57% notified ED MD and placed on non-rebreather at 15L while bipap was ordered and hospitalist team notified of patient's condition.

## 2019-05-18 NOTE — ED Notes (Signed)
Meal tray given.

## 2019-05-18 NOTE — ED Notes (Signed)
ECHO at bedside.

## 2019-05-18 NOTE — ED Notes (Signed)
Spoke to patient about code status wishes in the event that bipap does not improve oxygenation. Patient will think about it but will remain a full code at this time. Told hospitalist this as well

## 2019-05-18 NOTE — Plan of Care (Signed)

## 2019-05-18 NOTE — Progress Notes (Signed)
[facility]                                                                              Patient Demographics  Summer Nielsen, is a 86 y.o. female, DOB - 10-26-1927, YOV:[id number]  Admit date - 05/17/2019   Admitting Physician Trinisha Paget Krystal Eaton, MD  Outpatient Primary MD for the patient is Maurice Small, MD  Outpatient specialists:   LOS - 1  days   Medical records reviewed and are as summarized below:    Chief Complaint  Patient presents with  . 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  Patient reported that she was taken off of Lasix and losartan by pulmonology and oncology for contrasted CT scan imagings.  Patient has not had her Lasix for 1 week.  Patient presented with shortness of breath with minimal exertion, swelling in her lower extremities, orthopnea, hypoxia, O2 sats in 70s at home.  At baseline on 2 L O2 at home, initially placed on 5 L O2 in ED.  Received Lasix 40 mg IV x2 yesterday, overnight became more dyspneic and was placed on BiPAP. COVID-19 test negative.  Cardiology was consulted.  Patient was admitted for further work-up, aggressive diuresis  Assessment & Plan    Principal Problem:   Acute respiratory failure ([place]) with hypoxia likely secondary to  Acute on chronic diastolic CHF (congestive heart failure) ([place]) -Likely due to missing of Lasix at home, elevated BNP,+ JVD, orthopnea,, + lower extremity edema showed pulmonary edema.  Overnight was placed on BiPAP for respiratory distress and hypoxia - last echo 10/2018 had shown EF of 60 to 65% -Patient was placed on IV Lasix 40 mg every 12 hours, holding losartan and amlodipine -However still very volume overloaded, Lasix increased to 80 mg IV every 12  hours -Follow strict I's and O's and daily weights, repeat 2D echocardiogram -Cardiology consulted, appreciate assistance   Active Problems:  history of TIA (transient ischemic attack) -Continue statin - does not appear to be on aspirin and Plavix    Postsurgical hypothyroidism -TSH 4.6, continue Synthroid     Hyperlipidemia -Continue statin    Hypertensive heart disease with heart failure (HCC) -Continue Lasix, propranolol, hydralazine IV as needed -Currently holding losartan, amlodipine  Anemia, macrocytic: Likely anemia of chronic disease, malignancy -Hemoglobin 7.5 today, no obvious bleeding, will follow anemia panel, check B12, folate, LDH, reticulocyte count - will transfuse for hemoglobin less than 7.5    Cholangiocarcinoma metastatic to liver [facility]), history of pulmonary fibrosis -Completed chemotherapy on 04/11/2019, was scheduled to have high-resolution chest CT outpatient for staging -follow-up with Dr. Benay Spice, she has a office visit scheduled on 05/22/2019  Hyperkalemia -Improved to 4.7 with diuresis.  Potassium 5.5 on admission  History of C. difficile in 2020 -Patient states she has off-and-on diarrhea, no increase, no abdominal pain, fever chills  History of pulmonary fibrosis -Outpatient follow-up with pulmonology, currently being worked up for interstitial lung  disease  Code Status: Full CODE STATUS, discussed with the patient DVT Prophylaxis: Heparin subcu Family Communication: Discussed all imaging results, lab results, explained to the patient and patient's son on the phone    Disposition Plan: Patient from home, anticipated discharge home once volume status is improved, currently on BiPAP  Time Spent in minutes   40 minutes Procedures:  None  Consultants:   Cardiology  Antimicrobials:   Anti-infectives (From admission, onward)   None          Medications  Scheduled Meds: . atorvastatin  20 mg Oral Daily  . calcium-vitamin D   1 tablet Oral Daily  . dicyclomine  20 mg Oral TID  . fluticasone furoate-vilanterol  1 puff Inhalation Daily  . furosemide  80 mg Intravenous Q12H  . gabapentin  300 mg Oral TID  . heparin  5,000 Units Subcutaneous Q8H  . ipratropium-albuterol  3 mL Nebulization TID  . latanoprost  1 drop Both Eyes QHS  . levothyroxine  150 mcg Oral QAC breakfast  . pantoprazole  40 mg Oral Daily  . propranolol  20 mg Oral BID  . sodium chloride flush  3 mL Intravenous Q12H   Continuous Infusions: . sodium chloride     PRN Meds:.sodium chloride, albuterol, hydrALAZINE, loperamide, ondansetron (ZOFRAN) IV, sodium chloride flush      Subjective:   Saarah Dewing was seen and examined today.  Overnight issues reviewed, patient with respiratory distress and was placed on BiPAP.  At the time of my encounter, still on BiPAP and appears to be still very volume overloaded. + Shortness of breath.  Denies any chest pain.  No nausea vomiting diarrhea or abdominal pain.  No fevers  Objective:   Vitals:   05/18/19 0800 05/18/19 0830 05/18/19 0930 05/18/19 1000  BP: (!) 154/69 (!) 135/57 (!) 145/62 138/79  Pulse: 76 72 81 83  Resp: (!) 21 (!) 21 (!) 23 (!) 23  Temp:      TempSrc:      SpO2: 95% 97% 94% 90%    Intake/Output Summary (Last 24 hours) at 05/18/2019 1022 Last data filed at 05/18/2019 0040 Gross per 24 hour  Intake 240 ml  Output 1000 ml  Net -760 ml     Wt Readings from Last 3 Encounters:  05/08/19 61 kg  05/02/19 60.7 kg  04/11/19 59 kg     Exam  General: Alert and oriented x 3,  on BiPAP  Cardiovascular: S1 S2 auscultated, no murmurs, RRR, +JVD  Respiratory: Diffuse bilateral crackles  Gastrointestinal: Soft, nontender, nondistended, + bowel sounds  Ext: 2+ pedal edema bilaterally  Neuro: Moving all 4 extremities  Musculoskeletal: No digital cyanosis, clubbing  Skin: No rashes  Psych: Normal affect and demeanor, alert and oriented x3    Data Reviewed:  I  have personally reviewed following labs and imaging studies  Micro Results Recent Results (from the past 240 hour(s))  SARS CORONAVIRUS 2 (TAT 6-24 HRS) Nasopharyngeal Nasopharyngeal Swab     Status: None   Collection Time: 05/17/19  6:02 PM   Specimen: Nasopharyngeal Swab  Result Value Ref Range Status   SARS Coronavirus 2 NEGATIVE NEGATIVE Final    Comment: (NOTE) SARS-CoV-2 target nucleic acids are NOT DETECTED. The SARS-CoV-2 RNA is generally detectable in upper and lower respiratory specimens during the acute phase of infection. Negative results do not preclude SARS-CoV-2 infection, do not rule out co-infections with other pathogens, and should not be used as the sole basis for treatment or  other patient management decisions. Negative results must be combined with clinical observations, patient history, and epidemiological information. The expected result is Negative. Fact Sheet for Patients: SugarRoll.be Fact Sheet for Healthcare Providers: https://www.woods-mathews.com/ This test is not yet approved or cleared by the [place] FDA and  has been authorized for detection and/or diagnosis of SARS-CoV-2 by FDA under an Emergency Use Authorization (EUA). This EUA will remain  in effect (meaning this test can be used) for the duration of the COVID-19 declaration under Section 56 4(b)(1) of the Act, 21 U.S.C. section 360bbb-3(b)(1), unless the authorization is terminated or revoked sooner. Performed at [facility], [place] [address], [place], [place] [address]     Radiology Reports DG Chest [place] 1 View  Result Date: 05/17/2019 CLINICAL DATA:  Shortness of breath EXAM: PORTABLE CHEST 1 VIEW COMPARISON:  03/13/2019 FINDINGS: Cardiac shadow is enlarged but stable. Aortic calcifications are seen. Right chest wall port is noted. Increasing vascular congestion is noted with parenchymal edema worse on the right than the left. No bony  abnormality is seen. IMPRESSION: Increasing vascular congestion and parenchymal edema. Electronically Signed   By: Inez Catalina M.D.   On: 05/17/2019 15:43    Lab Data:  CBC: Recent Labs  Lab 05/17/19 1529 05/18/19 0430  WBC 13.9* 12.2*  HGB 8.7* 7.5*  HCT 28.2* 24.9*  MCV 102.9* 102.9*  PLT 371 092   Basic Metabolic Panel: Recent Labs  Lab 05/16/19 1040 05/17/19 1529 05/18/19 0430 05/18/19 0810  NA 139 137 137  --   K 5.2* 5.5* 4.7  --   CL 108 107 105  --   CO2 22 21* 21*  --   GLUCOSE 126* 139* 104*  --   BUN 41* 42* 41*  --   CREATININE 1.46* 1.47* 1.54*  --   CALCIUM 9.2 9.4 9.0  --   MG  --   --   --  2.5*   GFR: Estimated Creatinine Clearance: 20.9 mL/min (A) (by C-G formula based on SCr of 1.54 mg/dL (H)). Liver Function Tests: Recent Labs  Lab 05/17/19 1529  AST 28  ALT 17  ALKPHOS 65  BILITOT 1.1  PROT 7.1  ALBUMIN 3.6   No results for input(s): LIPASE, AMYLASE in the last 168 hours. No results for input(s): AMMONIA in the last 168 hours. Coagulation Profile: No results for input(s): INR, PROTIME in the last 168 hours. Cardiac Enzymes: No results for input(s): CKTOTAL, CKMB, CKMBINDEX, TROPONINI in the last 168 hours. BNP (last 3 results) No results for input(s): PROBNP in the last 8760 hours. HbA1C: No results for input(s): HGBA1C in the last 72 hours. CBG: No results for input(s): GLUCAP in the last 168 hours. Lipid Profile: No results for input(s): CHOL, HDL, LDLCALC, TRIG, CHOLHDL, LDLDIRECT in the last 72 hours. Thyroid Function Tests: Recent Labs    05/18/19 0430  TSH 4.626*   Anemia Panel: No results for input(s): VITAMINB12, FOLATE, FERRITIN, TIBC, IRON, RETICCTPCT in the last 72 hours. Urine analysis:    Component Value Date/Time   COLORURINE YELLOW 03/13/2019 1305   APPEARANCEUR HAZY (A) 03/13/2019 1305   LABSPEC 1.020 03/13/2019 1305   PHURINE 5.0 03/13/2019 1305   GLUCOSEU NEGATIVE 03/13/2019 1305   HGBUR MODERATE (A)  03/13/2019 1305   BILIRUBINUR NEGATIVE 03/13/2019 1305   KETONESUR NEGATIVE 03/13/2019 1305   PROTEINUR >=300 (A) 03/13/2019 1305   NITRITE NEGATIVE 03/13/2019 1305   LEUKOCYTESUR TRACE (A) 03/13/2019 1305     Gracilyn Gunia  M.D. [facility] 05/18/2019, 10:22 AM   Call night coverage person covering after 7pm

## 2019-05-18 NOTE — Consult Note (Signed)
Cardiology Consultation:   Patient ID: Summer Nielsen MRN: [id number]; DOB: 11-11-1927  Admit date: 05/17/2019 Date of Consult: 05/18/2019  Primary Care Provider: Maurice Small, MD Primary Cardiologist: Candee Furbish, MD Primary Electrophysiologist:  None    Patient Profile:   Summer Nielsen is a 86 y.o. female with a history of chronic diastolic CHF, pulmonary fibrosis, TIA, hypertension, hyperlipidemia, hypothyroidism, GERD, and metastatic cholangiocarcinoma who is being seen for evaluation of acute on chronic CHF at the request of Dr. Tana Coast.   History of Present Illness:   [name] is a 86 year old female with the above history who is followed by Dr. Marlou Porch. She was first seen by Dr. Marlou Porch in 2017 after PCP (Dr. Justin Mend) referred patient for further evaluation of acute diastolic CHF. She was seen by PCP for shortness of breath and was initially treated for pneumonia; however, did not improve much with antibiotics. Therefore, PCP ordered Echo which showed grade 2 diastolic dysfunction and BNP was elevated. PCP placed her on Lasix 73m daily with some improvement in shortness of breath but more improvement in lower extremity edema. This is when she was referred to Dr. SMarlou Porch No additional medications changes were made and patient was continued on Lasix. Patient had continued dyspnea on exertion and was ultimately referred to Pulmonology in 08/2017. She is currently being worked up for interstitial lung disease with CT suggestive of pulmonary fibrosis. In 2019, she was also diagnosed with metastatic cholangiocarcinoma and has been treated with radiation and chemo.    Patient was last seen by LTruitt Merle NP, in 12/20202 at which time she seemed more deconditioned and reported being more short of breath. O2 sats dropped to 81% after ambulating less than 200 ft in the office. She was placed on 2L of O2 with improvement of O2 sats to 100%. Home O2 was ordered at that visit and Lasix was increased  to 678min the morning and 4059mn the evening. Amlodipine was stopped.   She was seen by Pulmonology in 04/2019 and high-resolution CT and PFTs were ordered. She was also started on Breo inhaler. She was scheduled to have these test yesterday. Oncologist advised patient to hold Losartan and Lasix for a week prior to these tests to avoid nephrotoxicity. Unfortunately, she developed worsening shortness of breath, orthopnea, abdominal distension, and lower extremity edema with this. She also reported some chest pressure when extremely short of breath that would resolve as breathing improved. No palpitations, lightheadedness, dizziness, or syncope. She has been feeling more weak than usual. She reports nose bleeds but denies any abnormal bleeding in her urine or stools. She was seen by Pulmonology for a virtual visit yesterday and reported these symptoms. Son reported that O2 sat was 74% on 3L via nasal cannula. Therefore, patient was advised to go to the ED.   In the ED, O2 sats in the 90's on 4L. EKG showed no acute ischemic changes. High-sensitivity troponin negative x2. BNP elevated in the 950's. Chest x-ray showed increasing vascular congestion and parenchymal edema. WBC 13.9, Hgb 8.7, Plts 371. Na 137, K 5.5, Glucose 139, BUN 42, Cr 1.47. Patient was started on IV Lasix and admitted. Overnight, around 1:40am, patient was found in acute respiratory distress in tripod position. She was pale and diaphoretic and O2 sat was 57%. She was started on BiPAP.   At the time of this evaluation, patient wearing BiPAP and states she feels much better. O2 sats in the low 90's. She denies any chest pressure at this  time.  Past Medical History:  Diagnosis Date  . Cancer ([place])    squamous cell skin cancer on left leg-removed  . CHF (congestive heart failure) ([place])   . Cholangiocarcinoma ([place]) dx'd 11/2017  . Complication of anesthesia   . GERD (gastroesophageal reflux disease)   . Glaucoma   . High blood pressure     . Hyperlipidemia   . Hypothyroidism   . OP (osteoporosis)   . PONV (postoperative nausea and vomiting)   . Postsurgical hypothyroidism   . Pulmonary fibrosis ([place])   . 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 Tachycardia   . TIA (transient ischemic attack)     Past Surgical History:  Procedure Laterality Date  . ABDOMINAL HYSTERECTOMY    . BACK SURGERY N/A 03/2017   Lumbar  . CHOLECYSTECTOMY    . EYE SURGERY     removed cateracts on both eyes  . IR IMAGING GUIDED PORT INSERTION  09/06/2018  . JOINT REPLACEMENT     3 hip replacement on left, right knee replacement  . THYROIDECTOMY       Home Medications:  Prior to Admission medications   Medication Sig Start Date End Date Taking? Authorizing Provider  acetaminophen (TYLENOL) 500 MG tablet Take 1,000 mg by mouth daily as needed for moderate pain or headache.   Yes [provider]  amLODipine (NORVASC) 5 MG tablet Take 5 mg by mouth daily.   Yes [provider]  atorvastatin (LIPITOR) 20 MG tablet Take 20 mg by mouth daily. 03/05/19  Yes [provider]  bimatoprost (LUMIGAN) 0.01 % SOLN Place 1 drop into both eyes at bedtime.    Yes [provider]  calcium-vitamin D (OSCAL WITH D) 500-200 MG-UNIT tablet Take 1 tablet by mouth daily.    Yes [provider]  fluticasone furoate-vilanterol (BREO ELLIPTA) 100-25 MCG/INH AEPB Inhale 1 puff into the lungs daily. 05/02/19  Yes Mannam, Praveen, MD  furosemide (LASIX) 40 MG tablet Take 40-60 mg by mouth See admin instructions. Take 60 mg in the AM and 40 mg after lunch   Yes [provider]  gabapentin (NEURONTIN) 300 MG capsule TAKE 1 CAPSULE BY MOUTH THREE TIMES A DAY Patient taking differently: Take 300 mg by mouth 3 (three) times daily.  12/12/17  Yes Magnus Sinning, MD  ibuprofen (ADVIL,MOTRIN) 200 MG tablet Take 200 mg by mouth every 6 (six) hours as needed for moderate pain (back surgery).   Yes  [provider]  Iron-FA-B Cmp-C-Biot-Probiotic (FUSION PLUS) CAPS Take 1 capsule by mouth daily. 04/14/17  Yes Jessy Oto, MD  levothyroxine (SYNTHROID, LEVOTHROID) 150 MCG tablet Take 150 mcg by mouth daily. 06/01/18  Yes [provider]  loperamide (IMODIUM) 2 MG capsule Take 2 capsules by mouth 4 (four) times daily as needed.   Yes [provider]  losartan (COZAAR) 50 MG tablet Take 50 mg by mouth daily.   Yes [provider]  omeprazole (PRILOSEC) 40 MG capsule Take 40 mg by mouth daily before breakfast.  10/04/18  Yes [provider]  propranolol (INDERAL) 20 MG tablet Take 20 mg by mouth 2 (two) times daily.    Yes [provider]  albuterol (VENTOLIN HFA) 108 (90 Base) MCG/ACT inhaler Inhale 2 puffs into the lungs every 6 (six) hours as needed for wheezing or shortness of breath. Patient not taking: Reported on 05/17/2019 05/16/19   Martyn Ehrich, NP  dicyclomine (BENTYL) 20 MG tablet Take 20 mg  by mouth 3 (three) times daily. 04/12/19   [provider]  HYDROmorphone (DILAUDID) 2 MG tablet Take 0.5-1 tablets (1-2 mg total) by mouth every 4 (four) hours as needed for severe pain. Patient not taking: Reported on 05/17/2019 12/08/18   Ladell Pier, MD  KLOR-CON M10 10 MEQ tablet TAKE 2 TABLETS EVERY DAY Patient not taking: No sig reported 03/05/19   Ladell Pier, MD  lidocaine-prilocaine (EMLA) cream Apply 1 application topically as directed. Patient not taking: Reported on 05/17/2019 09/01/18   Ladell Pier, MD  predniSONE (DELTASONE) 10 MG tablet Take 2 tabs x 5 days 05/17/19   Martyn Ehrich, NP    Inpatient Medications: Scheduled Meds: . atorvastatin  20 mg Oral Daily  . calcium-vitamin D  1 tablet Oral Daily  . dicyclomine  20 mg Oral TID  . fluticasone furoate-vilanterol  1 puff Inhalation Daily  . furosemide  40 mg Intravenous Q12H  . gabapentin  300 mg Oral TID  . heparin  5,000 Units Subcutaneous Q8H   . ipratropium-albuterol  3 mL Nebulization TID  . latanoprost  1 drop Both Eyes QHS  . levothyroxine  150 mcg Oral QAC breakfast  . pantoprazole  40 mg Oral Daily  . propranolol  20 mg Oral BID  . sodium chloride flush  3 mL Intravenous Q12H   Continuous Infusions: . sodium chloride     PRN Meds: sodium chloride, albuterol, hydrALAZINE, loperamide, ondansetron (ZOFRAN) IV, sodium chloride flush  Allergies:    Allergies  Allergen Reactions  . Levofloxacin Nausea Only    Dizzy, hot, foot pain, insomnia  . Percocet [Oxycodone-Acetaminophen] Nausea And Vomiting and Other (See Comments)    sick    Social History:   Social History   Socioeconomic History  . Marital status: Married    Spouse name: Not on file  . Number of children: Not on file  . Years of education: Not on file  . Highest education level: Not on file  Occupational History  . Not on file  Tobacco Use  . Smoking status: Never Smoker  . Smokeless tobacco: Never Used  Substance and Sexual Activity  . Alcohol use: No  . Drug use: No  . Sexual activity: Not on file  Other Topics Concern  . Not on file  Social History Narrative  . Not on file   Social Determinants of Health   Financial Resource Strain:   . Difficulty of Paying Living Expenses:   Food Insecurity:   . Worried About Charity fundraiser in the Last Year:   . Arboriculturist in the Last Year:   Transportation Needs:   . Film/video editor (Medical):   Marland Kitchen Lack of Transportation (Non-Medical):   Physical Activity:   . Days of Exercise per Week:   . Minutes of Exercise per Session:   Stress:   . Feeling of Stress :   Social Connections:   . Frequency of Communication with Friends and Family:   . Frequency of Social Gatherings with Friends and Family:   . Attends Religious Services:   . Active Member of Clubs or Organizations:   . Attends Archivist Meetings:   Marland Kitchen Marital Status:   Intimate Partner Violence:   . Fear of  Current or Ex-Partner:   . Emotionally Abused:   Marland Kitchen Physically Abused:   . Sexually Abused:     Family History:     Family History  Problem Relation Age of Onset  .  Heart attack Mother   . Cerebral aneurysm Mother   . Stroke Mother   . Hypertension Mother   . Heart attack Father   . Heart disease Sister   . Heart disease Brother      ROS:  Please see the history of present illness.  All other ROS reviewed and negative.     Physical Exam/Data:   Vitals:   05/18/19 0213 05/18/19 0400 05/18/19 0404 05/18/19 0600  BP:  (!) 136/56  135/65  Pulse:  71 72 76  Resp:  16 16 (!) 26  Temp:  98.9 F (37.2 C)    TempSrc:  Axillary    SpO2: 99% 97% 97% 99%    Intake/Output Summary (Last 24 hours) at 05/18/2019 0705 Last data filed at 05/18/2019 0040 Gross per 24 hour  Intake 240 ml  Output 1000 ml  Net -760 ml   Last 3 Weights 05/08/2019 05/02/2019 04/11/2019  Weight (lbs) 134 lb 8 oz 133 lb 12.8 oz 130 lb 1.6 oz  Weight (kg) 61.009 kg 60.691 kg 59.013 kg     There is no height or weight on file to calculate BMI.  General: 86 y.o. frail female on BiPAP. HEENT: Normocephalic and atraumatic. Sclera clear.  Neck: Supple. JVD elevated. Heart: RRR. Distinct S1 and S2. No murmurs, gallops, or rubs. Radial pulses 2+ and equal bilaterally. Lungs: On BiPAP. Diffuse crackles throughout all lung fields as well as some rhonchi. Abdomen: Soft, mildly distended, and non-tender to palpation. Bowel sounds present. Extremities: 2-3+ pitting edema of bilateral lower extremities.    Skin: Warm and dry. Neuro: Alert and oriented x3. No focal deficits. Psych: Normal affect. Responds appropriately.  EKG:  The EKG was personally reviewed and demonstrates:  Normal sinus rhythm, rate 78 bpm, with non-specific ST/T changes. Normal axis. Normal PR and QRS interval. QTC 55 ms.   Telemetry:  Telemetry was personally reviewed and demonstrates:  Normal sinus rhythm with rates in the 60's to 80's. A few PVC  and one short run of SVT vs sinus tachycardia.   Relevant CV Studies:  Lexiscan Myoview 06/07/2016:  Nuclear stress EF: 71%.  No T wave inversion was noted during stress.  There was no ST segment deviation noted during stress.  This is a low risk study.   No reversible ischemia. LVEF 71% with normal wall motion. This is a low risk study. _______________  Echocardiogram 10/19/2018: Impressions: 1. The left ventricle has normal systolic function with an ejection  fraction of 60-65%. The cavity size was normal. Left ventricular diastolic  parameters were normal.  2. Normal GLS -18.7.  3. The right ventricle has normal systolic function. The cavity was  normal. There is no increase in right ventricular wall thickness.  4. Left atrial size was mildly dilated.  5. Mild thickening of the mitral valve leaflet. Mild calcification of the  mitral valve leaflet. There is mild mitral annular calcification present.  6. The aortic valve is tricuspid. Mild thickening of the aortic valve.  Mild calcification of the aortic valve. Aortic valve regurgitation is  trivial by color flow Doppler.  7. The aorta is normal unless otherwise noted.  8. Small calcified mobile structure likely represents chiarri network.   Laboratory Data:  High Sensitivity Troponin:   Recent Labs  Lab 05/17/19 1529 05/17/19 1806  TROPONINIHS 6 6     Chemistry Recent Labs  Lab 05/16/19 1040 05/17/19 1529 05/18/19 0430  NA 139 137 137  K 5.2* 5.5* 4.7  CL 108  107 105  CO2 22 21* 21*  GLUCOSE 126* 139* 104*  BUN 41* 42* 41*  CREATININE 1.46* 1.47* 1.54*  CALCIUM 9.2 9.4 9.0  GFRNONAA 31* 31* 29*  GFRAA 36* 36* 34*  ANIONGAP _0 Recent Labs  Lab 05/17/19 1529  PROT 7.1  ALBUMIN 3.6  AST 28  ALT 17  ALKPHOS 65  BILITOT 1.1   Hematology Recent Labs  Lab 05/17/19 1529 05/18/19 0430  WBC 13.9* 12.2*  RBC 2.74* 2.42*  HGB 8.7* 7.5*  HCT 28.2* 24.9*  MCV 102.9* 102.9*  MCH 31.8 31.0   MCHC 30.9 30.1  RDW 17.9* 18.0*  PLT 371 304   BNP Recent Labs  Lab 05/17/19 1529  BNP 957.7*    DDimer No results for input(s): DDIMER in the last 168 hours.   Radiology/Studies:  DG Chest Port 1 View  Result Date: 05/17/2019 CLINICAL DATA:  Shortness of breath EXAM: PORTABLE CHEST 1 VIEW COMPARISON:  03/13/2019 FINDINGS: Cardiac shadow is enlarged but stable. Aortic calcifications are seen. Right chest wall port is noted. Increasing vascular congestion is noted with parenchymal edema worse on the right than the left. No bony abnormality is seen. IMPRESSION: Increasing vascular congestion and parenchymal edema. Electronically Signed   By: Inez Catalina M.D.   On: 05/17/2019 15:43   Assessment and Plan:   Acute Hypoxic Respiratory Failure Secondary to Acute on Chronic Diastolic CHF  - Presented with worsening shortness of breath, orthopnea, and lower extremity edema after coming off Lasix for 1 week.  - BNP elevated in the 950's.  - Chest x-ray showed increased vascular congestion and parenchymal edema.  - Normal systolic function on last Echo in 10/2018. Repeat Echo pending.  - Started on Lasix 14m twice daily. Documented urinary output of 1 L since then.  - Patient still significantly volume overloaded and on BiPAP. Will increase IV Lasix to 862mtwice daily. Will need to watch renal function closely.  - Continue to hold home Losartan for now given renal function to allow for aggressive diuresis.  - Continue Propranolol 2049mwice daily.  - Continue to monitor daily weights, strict I/O's, and renal function.   Pulmonary Fibrosis  - Followed by Pulmonology and currently being worked up for interstitial lung disease as outpatient.   Hypertension  - BP currently well controlled. - Continue home Propranolol. - Continue to hold home Losartan for now to allow for aggressive diuresis.   Hyperlipidemia  - Continue home Lipitor 9m80mily.   Hypothyroidism - TSH mildly elevated  at 4.626.  - Management per primary team.   Metastatic Cholangiocarcinoma  - Management per primary team.   Hyperkalemia - Potassium 5.5 on admission. Improved to 4.7 with diuresis. - Will continue to monitor and check Magnesium as well.   CKD Stage III - Creatinine 1.47 on admission and 1.54 this morning. Recent baseline seems to be in the 1.2 to 1.5 range. - BUN elevated in the 40's. Baseline in the 20's.  - May be secondary to renal congestion. - Continue to monitor closely with diuresis.   For questions or updates, please contact [facility] consult [email or web address] for contact info under     Signed, CallDarreld Mclean-C  05/18/2019 7:05 AM

## 2019-05-19 DIAGNOSIS — I50813 Acute on chronic right heart failure: Secondary | ICD-10-CM

## 2019-05-19 DIAGNOSIS — L8990 Pressure ulcer of unspecified site, unspecified stage: Secondary | ICD-10-CM | POA: Insufficient documentation

## 2019-05-19 DIAGNOSIS — I471 Supraventricular tachycardia: Secondary | ICD-10-CM

## 2019-05-19 LAB — HAPTOGLOBIN: Haptoglobin: 11 mg/dL — ABNORMAL LOW (ref 41–333)

## 2019-05-19 LAB — CBC
HCT: 23.7 % — ABNORMAL LOW (ref 36.0–46.0)
Hemoglobin: 7.2 g/dL — ABNORMAL LOW (ref 12.0–15.0)
MCH: 31.6 pg (ref 26.0–34.0)
MCHC: 30.4 g/dL (ref 30.0–36.0)
MCV: 103.9 fL — ABNORMAL HIGH (ref 80.0–100.0)
Platelets: 283 10*3/uL (ref 150–400)
RBC: 2.28 MIL/uL — ABNORMAL LOW (ref 3.87–5.11)
RDW: 18.2 % — ABNORMAL HIGH (ref 11.5–15.5)
WBC: 10 10*3/uL (ref 4.0–10.5)
nRBC: 0 % (ref 0.0–0.2)

## 2019-05-19 LAB — BASIC METABOLIC PANEL
Anion gap: 11 (ref 5–15)
BUN: 43 mg/dL — ABNORMAL HIGH (ref 8–23)
CO2: 24 mmol/L (ref 22–32)
Calcium: 8.9 mg/dL (ref 8.9–10.3)
Chloride: 104 mmol/L (ref 98–111)
Creatinine, Ser: 1.57 mg/dL — ABNORMAL HIGH (ref 0.44–1.00)
GFR calc Af Amer: 33 mL/min — ABNORMAL LOW (ref >60)
GFR calc non Af Amer: 29 mL/min — ABNORMAL LOW (ref >60)
Glucose, Bld: 91 mg/dL (ref 70–99)
Potassium: 4 mmol/L (ref 3.5–5.1)
Sodium: 139 mmol/L (ref 135–145)

## 2019-05-19 LAB — ABO/RH: ABO/RH(D): A POS

## 2019-05-19 LAB — HEMOGLOBIN AND HEMATOCRIT, BLOOD
HCT: 29.2 % — ABNORMAL LOW (ref 36.0–46.0)
Hemoglobin: 9.3 g/dL — ABNORMAL LOW (ref 12.0–15.0)

## 2019-05-19 LAB — PREPARE RBC (CROSSMATCH)

## 2019-05-19 LAB — OCCULT BLOOD X 1 CARD TO LAB, STOOL: Fecal Occult Bld: POSITIVE — AB

## 2019-05-19 MED ORDER — SODIUM CHLORIDE 0.9% IV SOLUTION: Freq: Once | INTRAVENOUS | Status: DC

## 2019-05-19 MED ORDER — METHYLPREDNISOLONE SODIUM SUCC 125 MG IJ SOLR
125.0000 mg | Freq: Once | INTRAMUSCULAR | Status: AC
  Administered 2019-05-19: 08:00:00 125 mg via INTRAVENOUS
  Filled 2019-05-19: qty 2

## 2019-05-19 MED ORDER — IPRATROPIUM-ALBUTEROL 0.5-2.5 (3) MG/3ML IN SOLN
3.0000 mL | Freq: Two times a day (BID) | RESPIRATORY_TRACT | Status: DC
  Administered 2019-05-19 – 2019-05-24 (×10): 3 mL via RESPIRATORY_TRACT
  Filled 2019-05-19 (×10): qty 3

## 2019-05-19 MED ORDER — BENZONATATE 100 MG PO CAPS
100.0000 mg | ORAL_CAPSULE | Freq: Three times a day (TID) | ORAL | Status: DC
  Administered 2019-05-19 – 2019-05-24 (×16): 100 mg via ORAL
  Filled 2019-05-19 (×17): qty 1

## 2019-05-19 MED ORDER — GUAIFENESIN-DM 100-10 MG/5ML PO SYRP
5.0000 mL | ORAL_SOLUTION | ORAL | Status: DC | PRN
  Administered 2019-05-21: 23:00:00 5 mL via ORAL
  Filled 2019-05-19: qty 10

## 2019-05-19 MED ORDER — METHYLPREDNISOLONE SODIUM SUCC 40 MG IJ SOLR
40.0000 mg | Freq: Three times a day (TID) | INTRAMUSCULAR | Status: DC
  Administered 2019-05-19 – 2019-05-20 (×3): 40 mg via INTRAVENOUS
  Filled 2019-05-19 (×3): qty 1

## 2019-05-19 MED ORDER — AMLODIPINE BESYLATE 5 MG PO TABS
5.0000 mg | ORAL_TABLET | Freq: Every day | ORAL | Status: DC
  Administered 2019-05-19 – 2019-05-24 (×6): 5 mg via ORAL
  Filled 2019-05-19 (×6): qty 1

## 2019-05-19 NOTE — Consult Note (Signed)
[facility] Consult  Referring Provider: Mendel Corning, MD Primary Care Physician:  Maurice Small, MD Primary Gastroenterologist: Dr.Magod  Reason for Consultation: Anemia  HPI: Summer Nielsen is a 86 y.o. 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  Normally at home she is on 2 L oxygen via nasal cannula and currently is on 7 L, overnight noted to be more short of breath and was placed on BiPAP, Covid negative.  On blood work patient was found to be anemic, baseline hemoglobin between 8.7-8.9, hemoglobin today between 7.2-7.5, she was noted to have FOBT positive stools and states her stools are always dark as she takes iron supplementation. Patient states she has been progressively losing weight which she attributes to her chemotherapy and underlying malignancy.  At home she is on a PPI, omeprazole 40 mg daily for acid reflux.  She denies difficulty swallowing, pain on swallowing.  She denies abdominal pain.  She denies nausea, vomiting, early satiety.  She denies vomiting blood or coffee-ground emesis or noticing frank blood in stool.  Her last EGD was in 12/19 and appeared unremarkable.   Past Medical History:  Diagnosis Date  . Cancer ([place])    squamous cell skin cancer on left leg-removed  . CHF (congestive heart failure) ([place])   . Cholangiocarcinoma ([place]) dx'd 11/2017  . Complication of anesthesia   . GERD (gastroesophageal reflux disease)   . Glaucoma   . High blood pressure   . Hyperlipidemia   . Hypothyroidism   . OP (osteoporosis)   . PONV (postoperative nausea and vomiting)   . Postsurgical hypothyroidism   . Pulmonary fibrosis ([place])   . 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 Tachycardia   . TIA (transient ischemic attack)      Past Surgical History:  Procedure Laterality Date  . ABDOMINAL HYSTERECTOMY    . BACK SURGERY N/A 03/2017   Lumbar  . CHOLECYSTECTOMY    . EYE SURGERY     removed cateracts on both eyes  . IR IMAGING GUIDED PORT INSERTION  09/06/2018  . JOINT REPLACEMENT     3 hip replacement on left, right knee replacement  . THYROIDECTOMY      Prior to Admission medications   Medication Sig Start Date End Date Taking? Authorizing Provider  acetaminophen (TYLENOL) 500 MG tablet Take 1,000 mg by mouth daily as needed for moderate pain or headache.   Yes [provider]  amLODipine (NORVASC) 5 MG tablet Take 5 mg by mouth daily.   Yes [provider]  aspirin 81 MG chewable tablet Chew 81 mg by mouth daily.   Yes [provider]  atorvastatin (LIPITOR) 20 MG tablet Take 20 mg by mouth daily. 03/05/19  Yes [provider]  bimatoprost (LUMIGAN) 0.01 % SOLN Place 1 drop into both eyes at bedtime.    Yes [provider]  calcium-vitamin D (OSCAL WITH D) 500-200 MG-UNIT tablet Take 1 tablet by mouth daily.    Yes [provider]  fluticasone furoate-vilanterol (BREO ELLIPTA) 100-25 MCG/INH AEPB Inhale 1 puff into the lungs daily. 05/02/19  Yes Mannam, Praveen, MD  furosemide (LASIX) 40 MG tablet Take 40-60 mg by mouth See admin instructions. Take 60 mg in the AM and 40 mg after lunch   Yes [provider]  gabapentin (NEURONTIN) 300 MG capsule TAKE 1 CAPSULE BY MOUTH  THREE TIMES A DAY Patient taking differently: Take 300 mg by mouth 3 (three) times daily.  12/12/17  Yes Magnus Sinning, MD  ibuprofen (ADVIL,MOTRIN) 200 MG tablet Take 200 mg by mouth every 6 (six) hours as needed for moderate pain (back surgery).   Yes [provider]  Iron-FA-B Cmp-C-Biot-Probiotic (FUSION PLUS) CAPS Take 1 capsule by mouth daily. 04/14/17  Yes Jessy Oto, MD  levothyroxine (SYNTHROID, LEVOTHROID) 150 MCG tablet Take 150 mcg by mouth daily. 06/01/18   Yes [provider]  loperamide (IMODIUM) 2 MG capsule Take 2 capsules by mouth 4 (four) times daily as needed.   Yes [provider]  losartan (COZAAR) 50 MG tablet Take 50 mg by mouth daily.   Yes [provider]  omeprazole (PRILOSEC) 40 MG capsule Take 40 mg by mouth daily before breakfast.  10/04/18  Yes [provider]  propranolol (INDERAL) 20 MG tablet Take 20 mg by mouth 2 (two) times daily.    Yes [provider]  albuterol (VENTOLIN HFA) 108 (90 Base) MCG/ACT inhaler Inhale 2 puffs into the lungs every 6 (six) hours as needed for wheezing or shortness of breath. Patient not taking: Reported on 05/17/2019 05/16/19   Martyn Ehrich, NP  dicyclomine (BENTYL) 20 MG tablet Take 20 mg by mouth 3 (three) times daily. 04/12/19   [provider]  HYDROmorphone (DILAUDID) 2 MG tablet Take 0.5-1 tablets (1-2 mg total) by mouth every 4 (four) hours as needed for severe pain. Patient not taking: Reported on 05/17/2019 12/08/18   Ladell Pier, MD  KLOR-CON M10 10 MEQ tablet TAKE 2 TABLETS EVERY DAY Patient not taking: No sig reported 03/05/19   Ladell Pier, MD  lidocaine-prilocaine (EMLA) cream Apply 1 application topically as directed. Patient not taking: Reported on 05/17/2019 09/01/18   Ladell Pier, MD  predniSONE (DELTASONE) 10 MG tablet Take 2 tabs x 5 days 05/17/19   Martyn Ehrich, NP    Current Facility-Administered Medications  Medication Dose Route Frequency Provider Last Rate Last Admin  . 0.9 %  sodium chloride infusion (Manually program via Guardrails IV Fluids)   Intravenous Once Mendel Corning, MD   Stopped at 05/19/19 0905  . 0.9 %  sodium chloride infusion  250 mL Intravenous PRN Rai, Ripudeep K, MD      . acetaminophen (TYLENOL) tablet 650 mg  650 mg Oral Q4H PRN Rai, Ripudeep K, MD   650 mg at 05/18/19 1745  . albuterol (VENTOLIN HFA) 108 (90 Base) MCG/ACT inhaler 2 puff  2 puff Inhalation Q6H PRN Rai, Ripudeep  K, MD      . atorvastatin (LIPITOR) tablet 20 mg  20 mg Oral Daily Rai, Ripudeep K, MD   20 mg at 05/19/19 1011  . benzonatate (TESSALON) capsule 100 mg  100 mg Oral TID Rai, Ripudeep K, MD   100 mg at 05/19/19 1011  . calcium-vitamin D (OSCAL WITH D) 500-200 MG-UNIT per tablet 1 tablet  1 tablet Oral Daily Rai, Ripudeep K, MD   1 tablet at 05/19/19 1011  . chlorhexidine (PERIDEX) 0.12 % solution 15 mL  15 mL Mouth Rinse BID Rai, Ripudeep K, MD   15 mL at 05/19/19 1011  . Chlorhexidine Gluconate Cloth 2 % PADS 6 each  6 each Topical Daily Rai, Ripudeep K, MD   6 each at 05/19/19 1014  . dicyclomine (BENTYL) tablet 20 mg  20 mg Oral TID Rai, Vernelle Emerald, MD   20 mg  at 05/19/19 1012  . fluticasone furoate-vilanterol (BREO ELLIPTA) 100-25 MCG/INH 1 puff  1 puff Inhalation Daily Rai, Ripudeep K, MD   1 puff at 05/19/19 0758  . furosemide (LASIX) injection 80 mg  80 mg Intravenous Q12H Sande Rives E, PA-C   80 mg at 05/19/19 0810  . gabapentin (NEURONTIN) capsule 300 mg  300 mg Oral TID Rai, Ripudeep K, MD   300 mg at 05/19/19 1012  . guaiFENesin-dextromethorphan (ROBITUSSIN DM) 100-10 MG/5ML syrup 5 mL  5 mL Oral Q4H PRN Rai, Ripudeep K, MD      . heparin injection 5,000 Units  5,000 Units Subcutaneous Q8H Rai, Ripudeep K, MD   5,000 Units at 05/19/19 0603  . hydrALAZINE (APRESOLINE) injection 10 mg  10 mg Intravenous Q6H PRN Rai, Ripudeep K, MD      . ipratropium-albuterol (DUONEB) 0.5-2.5 (3) MG/3ML nebulizer solution 3 mL  3 mL Nebulization TID Rai, Ripudeep K, MD   3 mL at 05/19/19 0753  . latanoprost (XALATAN) 0.005 % ophthalmic solution 1 drop  1 drop Both Eyes QHS Rai, Ripudeep K, MD   1 drop at 05/18/19 2309  . levothyroxine (SYNTHROID) tablet 150 mcg  150 mcg Oral QAC breakfast Rai, Ripudeep K, MD   150 mcg at 05/19/19 0601  . loperamide (IMODIUM) capsule 2 mg  2 mg Oral QID PRN Rai, Ripudeep K, MD      . MEDLINE mouth rinse  15 mL Mouth Rinse q12n4p Rai, Ripudeep K, MD      .  methylPREDNISolone sodium succinate (SOLU-MEDROL) 40 mg/mL injection 40 mg  40 mg Intravenous Q8H Rai, Ripudeep K, MD      . ondansetron (ZOFRAN) injection 4 mg  4 mg Intravenous Q6H PRN Rai, Ripudeep K, MD      . pantoprazole (PROTONIX) EC tablet 40 mg  40 mg Oral Daily Rai, Ripudeep K, MD   40 mg at 05/19/19 1011  . propranolol (INDERAL) tablet 20 mg  20 mg Oral BID Rai, Ripudeep K, MD   20 mg at 05/19/19 1011  . sodium chloride flush (NS) 0.9 % injection 10-40 mL  10-40 mL Intracatheter Q12H Rai, Ripudeep K, MD   10 mL at 05/19/19 1014  . sodium chloride flush (NS) 0.9 % injection 10-40 mL  10-40 mL Intracatheter PRN Rai, Ripudeep K, MD      . sodium chloride flush (NS) 0.9 % injection 3 mL  3 mL Intravenous Q12H Rai, Ripudeep K, MD   3 mL at 05/19/19 1015  . sodium chloride flush (NS) 0.9 % injection 3 mL  3 mL Intravenous PRN Rai, Ripudeep K, MD        Allergies as of 05/17/2019 - Review Complete 05/17/2019  Allergen Reaction Noted  . Levofloxacin Nausea Only 10/30/2015  . Percocet [oxycodone-acetaminophen] Nausea And Vomiting and Other (See Comments) 10/30/2015    Family History  Problem Relation Age of Onset  . Heart attack Mother   . Cerebral aneurysm Mother   . Stroke Mother   . Hypertension Mother   . Heart attack Father   . Heart disease Sister   . Heart disease Brother     Social History   Socioeconomic History  . Marital status: Married    Spouse name: Not on file  . Number of children: Not on file  . Years of education: Not on file  . Highest education level: Not on file  Occupational History  . Not on file  Tobacco Use  . Smoking status:  Never Smoker  . Smokeless tobacco: Never Used  Substance and Sexual Activity  . Alcohol use: No  . Drug use: No  . Sexual activity: Not on file  Other Topics Concern  . Not on file  Social History Narrative  . Not on file   Social Determinants of Health   Financial Resource Strain:   . Difficulty of Paying Living  Expenses:   Food Insecurity:   . Worried About Charity fundraiser in the Last Year:   . Arboriculturist in the Last Year:   Transportation Needs:   . Film/video editor (Medical):   Marland Kitchen Lack of Transportation (Non-Medical):   Physical Activity:   . Days of Exercise per Week:   . Minutes of Exercise per Session:   Stress:   . Feeling of Stress :   Social Connections:   . Frequency of Communication with Friends and Family:   . Frequency of Social Gatherings with Friends and Family:   . Attends Religious Services:   . Active Member of Clubs or Organizations:   . Attends Archivist Meetings:   Marland Kitchen Marital Status:   Intimate Partner Violence:   . Fear of Current or Ex-Partner:   . Emotionally Abused:   Marland Kitchen Physically Abused:   . Sexually Abused:     Review of Systems: Positive for: GI: Described in detail in HPI.    Gen: fatigue, weakness, malaise, involuntary weight loss,  Denies any fever, chills, rigors, night sweats, anorexia,and sleep disorder CV: peripheral edema, Denies chest pain, angina, palpitations, syncope, orthopnea, PND, and claudication. Resp: dyspnea, Denies cough, sputum, wheezing, coughing up blood. GU : Denies urinary burning, blood in urine, urinary frequency, urinary hesitancy, nocturnal urination, and urinary incontinence. MS: Denies joint pain or swelling.  Denies muscle weakness, cramps, atrophy.  Derm: Denies rash, itching, oral ulcerations, hives, unhealing ulcers.  Psych: Denies depression, anxiety, memory loss, suicidal ideation, hallucinations,  and confusion. Heme: Denies bruising, bleeding, and enlarged lymph nodes. Neuro:  Denies any headaches, dizziness, paresthesias. Endo:  hypothyroid, Denies any problems with DM, adrenal function.  Physical Exam: Vital signs in last 24 hours: Temp:  [98 F (36.7 C)-98.8 F (37.1 C)] 98 F (36.7 C) (03/13 1114) Pulse Rate:  [59-89] 77 (03/13 1114) Resp:  [16-25] 17 (03/13 1114) BP:  (121-182)/(37-80) 162/58 (03/13 1114) SpO2:  [90 %-98 %] 93 % (03/13 1114) Weight:  [61.1 kg] 61.1 kg (03/13 0600) Last BM Date: 05/18/19  General: Frail, cachectic, on oxygen via nasal cannula Head:  Normocephalic and atraumatic. Eyes:  Sclera clear, no icterus.   Prominent pallor Ears:  Normal auditory acuity. Nose:  No deformity, discharge,  or lesions. Mouth:  No deformity or lesions.  Oropharynx pink & moist. Neck:  Supple; no masses or thyromegaly. Lungs:  Clear throughout to auscultation.   No wheezes, crackles, or rhonchi. No acute distress. Heart:  Regular rate and rhythm; no murmurs, clicks, rubs,  or gallops. Extremities: Bipedal pitting edema Neurologic:  Alert and  oriented x4;  grossly normal neurologically. Skin:  Intact without significant lesions or rashes. Psych:  Alert and cooperative. Normal mood and affect. Abdomen:  Soft, nontender and nondistended. No masses, hepatosplenomegaly or hernias noted. Normal bowel sounds, without guarding, and without rebound.         Lab Results: Recent Labs    05/17/19 1529 05/18/19 0430 05/19/19 0233  WBC 13.9* 12.2* 10.0  HGB 8.7* 7.5* 7.2*  HCT 28.2* 24.9* 23.7*  PLT 371 304  283   BMET Recent Labs    05/17/19 1529 05/18/19 0430 05/19/19 0233  NA 137 137 139  K 5.5* 4.7 4.0  CL 107 105 104  CO2 21* 21* 24  GLUCOSE 139* 104* 91  BUN 42* 41* 43*  CREATININE 1.47* 1.54* 1.57*  CALCIUM 9.4 9.0 8.9   LFT Recent Labs    05/17/19 1529  PROT 7.1  ALBUMIN 3.6  AST 28  ALT 17  ALKPHOS 65  BILITOT 1.1   PT/INR No results for input(s): LABPROT, INR in the last 72 hours.  Studies/Results: DG Chest Port 1 View  Result Date: 05/17/2019 CLINICAL DATA:  Shortness of breath EXAM: PORTABLE CHEST 1 VIEW COMPARISON:  03/13/2019 FINDINGS: Cardiac shadow is enlarged but stable. Aortic calcifications are seen. Right chest wall port is noted. Increasing vascular congestion is noted with parenchymal edema worse on the  right than the left. No bony abnormality is seen. IMPRESSION: Increasing vascular congestion and parenchymal edema. Electronically Signed   By: Inez Catalina M.D.   On: 05/17/2019 15:43   ECHOCARDIOGRAM COMPLETE  Result Date: 05/18/2019    ECHOCARDIOGRAM REPORT   Patient Name:   VONITA CALLOWAY Date of Exam: 05/18/2019 Medical Rec #:  [id number]         Height:       62.0 in Accession #:    [id number]        Weight:       134.5 lb Date of Birth:  05-Nov-1927         BSA:          1.615 m Patient Age:    66 years          BP:           154/69 mmHg Patient Gender: F                 HR:           74 bpm. Exam Location:  Inpatient Procedure: 2D Echo, Cardiac Doppler and Color Doppler Indications:    I50.23 Acute on chronic systolic (congestive) heart failure  History:        Patient has prior history of Echocardiogram examinations, most                 recent 10/19/2018. TIA; Risk Factors:Hypertension and                 Dyslipidemia. Cancer. GERD. Hypothyroidism.  Sonographer:    Jonelle Sidle Dance Referring Phys: [id number] RIPUDEEP K RAI IMPRESSIONS  1. Left ventricular ejection fraction, by estimation, is 60 to 65%. The left ventricle has normal function. The left ventricle has no regional wall motion abnormalities. Left ventricular diastolic parameters are consistent with Grade II diastolic dysfunction (pseudonormalization).  2. Right ventricular systolic function is mildly reduced. The right ventricular size is normal. There is mildly elevated pulmonary artery systolic pressure.  3. Left atrial size was moderately dilated.  4. The mitral valve is grossly normal. Mild mitral valve regurgitation. No evidence of mitral stenosis.  5. The aortic valve is tricuspid. Aortic valve regurgitation is trivial. No aortic stenosis is present. FINDINGS  Left Ventricle: Left ventricular ejection fraction, by estimation, is 60 to 65%. The left ventricle has normal function. The left ventricle has no regional wall motion abnormalities. The  left ventricular internal cavity size was normal in size. There is  no left ventricular hypertrophy. Left ventricular diastolic parameters are consistent with Grade II diastolic dysfunction (pseudonormalization). Right Ventricle: The right  ventricular size is normal. No increase in right ventricular wall thickness. Right ventricular systolic function is mildly reduced. There is mildly elevated pulmonary artery systolic pressure. The tricuspid regurgitant velocity  is 2.79 m/s, and with an assumed right atrial pressure of 3 mmHg, the estimated right ventricular systolic pressure is 17.7 mmHg. Left Atrium: Left atrial size was moderately dilated. Right Atrium: Right atrial size was normal in size. Pericardium: There is no evidence of pericardial effusion. Mitral Valve: The mitral valve is grossly normal. Moderate mitral annular calcification. Mild mitral valve regurgitation. No evidence of mitral valve stenosis. Tricuspid Valve: The tricuspid valve is normal in structure. Tricuspid valve regurgitation is mild . No evidence of tricuspid stenosis. Aortic Valve: The aortic valve is tricuspid. Aortic valve regurgitation is trivial. Aortic regurgitation PHT measures 454 msec. No aortic stenosis is present. Pulmonic Valve: The pulmonic valve was normal in structure. Pulmonic valve regurgitation is not visualized. No evidence of pulmonic stenosis. Aorta: The aortic root and ascending aorta are structurally normal, with no evidence of dilitation. IAS/Shunts: The atrial septum is grossly normal.  LEFT VENTRICLE PLAX 2D LVIDd:         4.70 cm  Diastology LVIDs:         3.20 cm  LV e' lateral:   8.05 cm/s LV PW:         1.10 cm  LV E/e' lateral: 14.4 LV IVS:        1.00 cm  LV e' medial:    4.79 cm/s LVOT diam:     2.10 cm  LV E/e' medial:  24.2 LV SV:         70 LV SV Index:   43 LVOT Area:     3.46 cm  RIGHT VENTRICLE             IVC RV Basal diam:  2.10 cm     IVC diam: 1.70 cm RV S prime:     10.70 cm/s TAPSE (M-mode): 1.7  cm LEFT ATRIUM             Index       RIGHT ATRIUM          Index LA diam:        4.30 cm 2.66 cm/m  RA Area:     8.81 cm LA Vol (A2C):   67.1 ml 41.55 ml/m RA Volume:   12.50 ml 7.74 ml/m LA Vol (A4C):   70.9 ml 43.90 ml/m LA Biplane Vol: 68.8 ml 42.60 ml/m  AORTIC VALVE LVOT Vmax:   82.50 cm/s LVOT Vmean:  53.800 cm/s LVOT VTI:    0.202 m AI PHT:      454 msec  AORTA Ao Root diam: 3.20 cm Ao Asc diam:  2.80 cm MITRAL VALVE                TRICUSPID VALVE MV Area (PHT): 3.77 cm     TR Peak grad:   31.1 mmHg MV Decel Time: 201 msec     TR Vmax:        279.00 cm/s MV E velocity: 116.00 cm/s MV A velocity: 81.90 cm/s   SHUNTS MV E/A ratio:  1.42         Systemic VTI:  0.20 m                             Systemic Diam: 2.10 cm Mertie Moores MD Electronically signed by Mertie Moores MD  Signature Date/Time: 05/18/2019/11:20:46 AM    Final     Impression: Symptomatic anemia without obvious GI blood loss Black stools on iron, FOBT positive Macrocytosis, MCV 103.9  Multiple comorbidities-currently on oxygen 7 L via nasal cannula History of interstitial lung disease Metastatic cholangiocarcinoma History of TIA History of C. difficile infection in 06/2018 Colonic diverticulosis noted on CAT scan from 02/2019  Plan: Patient's blood pressure and heart rate are within normal limits She is receiving her first unit of PRBC transfusion Aspirin has been kept on hold and she is on Protonix 40 mg daily  I do not recommend endoscopic intervention in a 86 year old female with multiple comorbidities. She had an EGD in 12/19 which was unremarkable. Although,there is possibility of peptic ulcer disease or erosive gastritis, which could lead to progressive anemia with FOBT positivity, anemia is also likely related to chronic kidney disease and underlying malignancy. Recommend supportive management, H&H monitoring and transfusion as needed  I discussed with patient that I would consider endoscopic intervention  only if it would change management such as evidence of active bleeding. She understands and verbalizes consent. Please recall GI if above situation changes.    LOS: 2 days   Ronnette Juniper, MD  05/19/2019, 1:07 PM

## 2019-05-19 NOTE — Progress Notes (Signed)
Progress Note  Patient Name: Summer Nielsen Date of Encounter: 05/19/2019  Primary Cardiologist: Candee Furbish, MD   Patient Profile     86 y.o. female with metastatic cholangiocarcinoma pulmonary fibrosis admitted with anemia and hypoxemia (74% on 3 L).  Respiratory failure prompted the need for BiPAP.  With significant improvement. Antecedent edema, meds being held, felt to have component of acute on chronic congestive heart failure. 3/21 echocardiogram normal LV function-LAE Bipap off on 6 L  Subjective   Feeling much better.  Still with significant edema.  Inpatient Medications    Scheduled Meds: . sodium chloride   Intravenous Once  . atorvastatin  20 mg Oral Daily  . benzonatate  100 mg Oral TID  . calcium-vitamin D  1 tablet Oral Daily  . chlorhexidine  15 mL Mouth Rinse BID  . Chlorhexidine Gluconate Cloth  6 each Topical Daily  . dicyclomine  20 mg Oral TID  . fluticasone furoate-vilanterol  1 puff Inhalation Daily  . furosemide  80 mg Intravenous Q12H  . gabapentin  300 mg Oral TID  . heparin  5,000 Units Subcutaneous Q8H  . ipratropium-albuterol  3 mL Nebulization TID  . latanoprost  1 drop Both Eyes QHS  . levothyroxine  150 mcg Oral QAC breakfast  . mouth rinse  15 mL Mouth Rinse q12n4p  . methylPREDNISolone (SOLU-MEDROL) injection  40 mg Intravenous Q8H  . pantoprazole  40 mg Oral Daily  . propranolol  20 mg Oral BID  . sodium chloride flush  10-40 mL Intracatheter Q12H  . sodium chloride flush  3 mL Intravenous Q12H   Continuous Infusions: . sodium chloride     PRN Meds: sodium chloride, acetaminophen, albuterol, guaiFENesin-dextromethorphan, hydrALAZINE, loperamide, ondansetron (ZOFRAN) IV, sodium chloride flush, sodium chloride flush   Vital Signs    Vitals:   05/19/19 1100 05/19/19 1114 05/19/19 1331 05/19/19 1340  BP: (!) 157/54 (!) 162/58  (!) 148/49  Pulse: 67 77 65 66  Resp: (!) _0 (!) 26  Temp:  98 F (36.7 C)  97.9 F (36.6 C)   TempSrc:  Oral  Oral  SpO2: 94% 93% 95% 99%  Weight:      Height:        Intake/Output Summary (Last 24 hours) at 05/19/2019 1349 Last data filed at 05/19/2019 1340 Gross per 24 hour  Intake 408 ml  Output 2700 ml  Net -2292 ml   Last 3 Weights 05/19/2019 05/08/2019 05/02/2019  Weight (lbs) 134 lb 11.2 oz 134 lb 8 oz 133 lb 12.8 oz  Weight (kg) 61.1 kg 61.009 kg 60.691 kg      Telemetry    Sinus- Personally Reviewed  ECG    Sinus rhythm- Personally Reviewed  Physical Exam    GEN: No acute distress.   Neck:  JVP 8 Cardiac: RRR, no murmurs, rubs, or gallops.  Respiratory: Clear to auscultation bilaterally. GI: Soft, nontender, non-distended  MS:  2+ edema; No deformity. Neuro:  Nonfocal  Psych: Normal affect   Labs    High Sensitivity Troponin:   Recent Labs  Lab 05/17/19 1529 05/17/19 1806  TROPONINIHS 6 6      Chemistry Recent Labs  Lab 05/17/19 1529 05/18/19 0430 05/19/19 0233  NA 137 137 139  K 5.5* 4.7 4.0  CL 107 105 104  CO2 21* 21* 24  GLUCOSE 139* 104* 91  BUN 42* 41* 43*  CREATININE 1.47* 1.54* 1.57*  CALCIUM 9.4 9.0 8.9  PROT 7.1  --   --  ALBUMIN 3.6  --   --   AST 28  --   --   ALT 17  --   --   ALKPHOS 65  --   --   BILITOT 1.1  --   --   GFRNONAA 31* 29* 29*  GFRAA 36* 34* 33*  ANIONGAP _0 Hematology Recent Labs  Lab 05/17/19 1529 05/17/19 1529 05/18/19 0430 05/18/19 1204 05/19/19 0233  WBC 13.9*  --  12.2*  --  10.0  RBC 2.74*   < > 2.42* 2.54* 2.28*  HGB 8.7*  --  7.5*  --  7.2*  HCT 28.2*  --  24.9*  --  23.7*  MCV 102.9*  --  102.9*  --  103.9*  MCH 31.8  --  31.0  --  31.6  MCHC 30.9  --  30.1  --  30.4  RDW 17.9*  --  18.0*  --  18.2*  PLT 371  --  304  --  283   < > = values in this interval not displayed.    BNP Recent Labs  Lab 05/17/19 1529  BNP 957.7*     DDimer No results for input(s): DDIMER in the last 168 hours.   Radiology    DG Chest Port 1 View  Result Date:  05/17/2019 CLINICAL DATA:  Shortness of breath EXAM: PORTABLE CHEST 1 VIEW COMPARISON:  03/13/2019 FINDINGS: Cardiac shadow is enlarged but stable. Aortic calcifications are seen. Right chest wall port is noted. Increasing vascular congestion is noted with parenchymal edema worse on the right than the left. No bony abnormality is seen. IMPRESSION: Increasing vascular congestion and parenchymal edema. Electronically Signed   By: Inez Catalina M.D.   On: 05/17/2019 15:43   ECHOCARDIOGRAM COMPLETE  Result Date: 05/18/2019    ECHOCARDIOGRAM REPORT   Patient Name:   Summer Nielsen Date of Exam: 05/18/2019 Medical Rec #:  [id number]         Height:       62.0 in Accession #:    [id number]        Weight:       134.5 lb Date of Birth:  1927/10/11         BSA:          1.615 m Patient Age:    76 years          BP:           154/69 mmHg Patient Gender: F                 HR:           74 bpm. Exam Location:  Inpatient Procedure: 2D Echo, Cardiac Doppler and Color Doppler Indications:    I50.23 Acute on chronic systolic (congestive) heart failure  History:        Patient has prior history of Echocardiogram examinations, most                 recent 10/19/2018. TIA; Risk Factors:Hypertension and                 Dyslipidemia. Cancer. GERD. Hypothyroidism.  Sonographer:    Jonelle Sidle Dance Referring Phys: [id number] RIPUDEEP K RAI IMPRESSIONS  1. Left ventricular ejection fraction, by estimation, is 60 to 65%. The left ventricle has normal function. The left ventricle has no regional wall motion abnormalities. Left ventricular diastolic parameters are consistent with Grade II diastolic dysfunction (pseudonormalization).  2. Right ventricular systolic function  is mildly reduced. The right ventricular size is normal. There is mildly elevated pulmonary artery systolic pressure.  3. Left atrial size was moderately dilated.  4. The mitral valve is grossly normal. Mild mitral valve regurgitation. No evidence of mitral stenosis.  5. The aortic  valve is tricuspid. Aortic valve regurgitation is trivial. No aortic stenosis is present. FINDINGS  Left Ventricle: Left ventricular ejection fraction, by estimation, is 60 to 65%. The left ventricle has normal function. The left ventricle has no regional wall motion abnormalities. The left ventricular internal cavity size was normal in size. There is  no left ventricular hypertrophy. Left ventricular diastolic parameters are consistent with Grade II diastolic dysfunction (pseudonormalization). Right Ventricle: The right ventricular size is normal. No increase in right ventricular wall thickness. Right ventricular systolic function is mildly reduced. There is mildly elevated pulmonary artery systolic pressure. The tricuspid regurgitant velocity  is 2.79 m/s, and with an assumed right atrial pressure of 3 mmHg, the estimated right ventricular systolic pressure is 55.9 mmHg. Left Atrium: Left atrial size was moderately dilated. Right Atrium: Right atrial size was normal in size. Pericardium: There is no evidence of pericardial effusion. Mitral Valve: The mitral valve is grossly normal. Moderate mitral annular calcification. Mild mitral valve regurgitation. No evidence of mitral valve stenosis. Tricuspid Valve: The tricuspid valve is normal in structure. Tricuspid valve regurgitation is mild . No evidence of tricuspid stenosis. Aortic Valve: The aortic valve is tricuspid. Aortic valve regurgitation is trivial. Aortic regurgitation PHT measures 454 msec. No aortic stenosis is present. Pulmonic Valve: The pulmonic valve was normal in structure. Pulmonic valve regurgitation is not visualized. No evidence of pulmonic stenosis. Aorta: The aortic root and ascending aorta are structurally normal, with no evidence of dilitation. IAS/Shunts: The atrial septum is grossly normal.  LEFT VENTRICLE PLAX 2D LVIDd:         4.70 cm  Diastology LVIDs:         3.20 cm  LV e' lateral:   8.05 cm/s LV PW:         1.10 cm  LV E/e' lateral:  14.4 LV IVS:        1.00 cm  LV e' medial:    4.79 cm/s LVOT diam:     2.10 cm  LV E/e' medial:  24.2 LV SV:         70 LV SV Index:   43 LVOT Area:     3.46 cm  RIGHT VENTRICLE             IVC RV Basal diam:  2.10 cm     IVC diam: 1.70 cm RV S prime:     10.70 cm/s TAPSE (M-mode): 1.7 cm LEFT ATRIUM             Index       RIGHT ATRIUM          Index LA diam:        4.30 cm 2.66 cm/m  RA Area:     8.81 cm LA Vol (A2C):   67.1 ml 41.55 ml/m RA Volume:   12.50 ml 7.74 ml/m LA Vol (A4C):   70.9 ml 43.90 ml/m LA Biplane Vol: 68.8 ml 42.60 ml/m  AORTIC VALVE LVOT Vmax:   82.50 cm/s LVOT Vmean:  53.800 cm/s LVOT VTI:    0.202 m AI PHT:      454 msec  AORTA Ao Root diam: 3.20 cm Ao Asc diam:  2.80 cm MITRAL VALVE  TRICUSPID VALVE MV Area (PHT): 3.77 cm     TR Peak grad:   31.1 mmHg MV Decel Time: 201 msec     TR Vmax:        279.00 cm/s MV E velocity: 116.00 cm/s MV A velocity: 81.90 cm/s   SHUNTS MV E/A ratio:  1.42         Systemic VTI:  0.20 m                             Systemic Diam: 2.10 cm Mertie Moores MD Electronically signed by Mertie Moores MD Signature Date/Time: 05/18/2019/11:20:46 AM    Final     Cardiac Studies   As above     Assessment & Plan    Respiratory failure  Congestive heart failure-diastolic-acute/chronic  Hypertension  Cholangiocarcinoma-metastatic  Renal insufficiency grade 3b  Anemia chronic  Continue IV diuresis with the residual edema.  Anticipate changing to p.o. tomorrow.  Blood pressure remains elevated.  Will resume amlodipine    For questions or updates, please contact [facility] Please consult [email or web address] for contact info under        Signed, Virl Axe, MD  05/19/2019, 1:49 PM

## 2019-05-19 NOTE — Progress Notes (Signed)
[facility]                                                                              Patient Demographics  Summer Nielsen, is a 86 y.o. female, DOB - 1927/09/06, RFF:[id number]  Admit date - 05/17/2019   Admitting Physician Tyquan Carmickle Krystal Eaton, MD  Outpatient Primary MD for the patient is Maurice Small, MD  Outpatient specialists:   LOS - 2  days   Medical records reviewed and are as summarized below:    Chief Complaint  Patient presents with  . 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  Patient reported that she was taken off of Lasix and losartan by pulmonology and oncology for contrasted CT scan imagings.  Patient has not had her Lasix for 1 week.  Patient presented with shortness of breath with minimal exertion, swelling in her lower extremities, orthopnea, hypoxia, O2 sats in 70s at home.  At baseline on 2 L O2 at home, initially placed on 5 L O2 in ED.  Received Lasix 40 mg IV x2 yesterday, overnight became more dyspneic and was placed on BiPAP. COVID-19 test negative.  Cardiology was consulted.  Patient was admitted for further work-up, aggressive diuresis  Assessment & Plan    Principal Problem:   Acute respiratory failure ([facility]) with hypoxia likely secondary to  Acute on chronic diastolic CHF (congestive heart failure) ([place]) -Likely due to missing of Lasix at home, elevated BNP,+ JVD, orthopnea,, + lower extremity edema showed pulmonary edema.  Was briefly placed on BiPAP for respiratory distress and hypoxia.  -Cardiology consulted.  Patient was placed on IV diuresis, Lasix was increased to 80 mg IV every 12 hours. -2D echo showed EF of 60 to 35%, grade 2 diastolic dysfunction -Negative balance of 3.4 L -BiPAP off  however was on 8 L high flow O2 [place] (per pulmonology note on 3/11, on 3 L of O2 at home), I decreased to 7 L O2 HFNC, O2 sats 91 to 93% -Wean O2 as tolerated -Also noticed to have wheezing, gave Solu-Medrol 125 mg IV x1, placed on Solu-Medrol 40 mg every 8 hours today with quick taper -Continue flutter valve, I-S, scheduled nebs   Active Problems:  history of TIA (transient ischemic attack) -Continue statin -Discussed with the patient, she is on aspirin 81 mg daily    Postsurgical hypothyroidism -TSH 4.6, continue Synthroid     Hyperlipidemia -Continue statin    Hypertensive heart disease with heart failure (HCC) -Continue Lasix, propranolol, hydralazine IV as needed -Currently holding losartan, amlodipine  Anemia, macrocytic: Likely anemia of chronic disease, malignancy -Hemoglobin 7.2, anemia panel showed saturation ratio 9, iron 28, B12 416, folate 70.9 - transfuse 1 unit packed RBC -FOBT positive, continue to hold aspirin, GI consulted.  Likely will not be able to undergo any endoscopy with respiratory failure, acute CHF -Continue Protonix     Cholangiocarcinoma metastatic to liver [facility]), history of pulmonary fibrosis -Completed  chemotherapy on 04/11/2019, was scheduled to have high-resolution chest CT outpatient for staging -follow-up with Dr. Benay Spice, she has a office visit scheduled on 05/22/2019  Hyperkalemia -Improved to 4.7 with diuresis.  Potassium 5.5 on admission  History of C. difficile in 2020 -Patient states she has off-and-on diarrhea, no increase, no abdominal pain, fever chills  History of pulmonary fibrosis -Outpatient follow-up with pulmonology, currently being worked up for interstitial lung disease  Code Status: Full CODE STATUS, discussed with the patient DVT Prophylaxis: Heparin subcu Family Communication: Discussed all imaging results, lab results, explained to the patient   Disposition Plan: Patient from home, anticipated discharge home  once able to tolerate oral Lasix, hypoxia improving and successful weaning of O2 close to her baseline.  Still undergoing IV Lasix diuresis, packed RBC transfusion precludes safe discharge to home.  Time Spent in minutes   40 minutes  Procedures:  2D echo Consultants:   Cardiology  Antimicrobials:   Anti-infectives (From admission, onward)   None         Medications  Scheduled Meds: . sodium chloride   Intravenous Once  . atorvastatin  20 mg Oral Daily  . benzonatate  100 mg Oral TID  . calcium-vitamin D  1 tablet Oral Daily  . chlorhexidine  15 mL Mouth Rinse BID  . Chlorhexidine Gluconate Cloth  6 each Topical Daily  . dicyclomine  20 mg Oral TID  . fluticasone furoate-vilanterol  1 puff Inhalation Daily  . furosemide  80 mg Intravenous Q12H  . gabapentin  300 mg Oral TID  . heparin  5,000 Units Subcutaneous Q8H  . ipratropium-albuterol  3 mL Nebulization TID  . latanoprost  1 drop Both Eyes QHS  . levothyroxine  150 mcg Oral QAC breakfast  . mouth rinse  15 mL Mouth Rinse q12n4p  . methylPREDNISolone (SOLU-MEDROL) injection  40 mg Intravenous Q8H  . pantoprazole  40 mg Oral Daily  . propranolol  20 mg Oral BID  . sodium chloride flush  10-40 mL Intracatheter Q12H  . sodium chloride flush  3 mL Intravenous Q12H   Continuous Infusions: . sodium chloride     PRN Meds:.sodium chloride, acetaminophen, albuterol, guaiFENesin-dextromethorphan, hydrALAZINE, loperamide, ondansetron (ZOFRAN) IV, sodium chloride flush, sodium chloride flush      Subjective:   Summer Nielsen was seen and examined today.  Seen this morning, on 8 L O2 high flow via nasal cannula.  Per patient mild improvement in her breathing from admission, abdomen feels less bloated.  Complaining of some chest discomfort.  Still fairly volume overloaded.  Denies any chest pain.  No nausea vomiting diarrhea or abdominal pain.  No fevers  Objective:   Vitals:   05/19/19 1011 05/19/19 1057 05/19/19  1100 05/19/19 1114  BP: (!) 159/55 (!) 155/63 (!) 157/54 (!) 162/58  Pulse:  76 67 77  Resp:  18 (!) 22 17  Temp:  98.2 F (36.8 C)  98 F (36.7 C)  TempSrc:    Oral  SpO2:  95% 94% 93%  Weight:      Height:        Intake/Output Summary (Last 24 hours) at 05/19/2019 1145 Last data filed at 05/19/2019 1015 Gross per 24 hour  Intake 13 ml  Output 2700 ml  Net -2687 ml     Wt Readings from Last 3 Encounters:  05/19/19 61.1 kg  05/08/19 61 kg  05/02/19 60.7 kg   Physical Exam  General: Alert and oriented x 3, NAD  Cardiovascular: S1 S2  clear, RRR.  2+ pedal edema b/l  Respiratory: Bilateral rales, wheezing  Gastrointestinal: Soft, nontender, nondistended, NBS  Ext: 2+ pedal edema bilaterally  Neuro: no new deficits  Musculoskeletal: No cyanosis, clubbing  Skin: No rashes  Psych: Normal affect and demeanor, alert and oriented x3      Data Reviewed:  I have personally reviewed following labs and imaging studies  Micro Results Recent Results (from the past 240 hour(s))  SARS CORONAVIRUS 2 (TAT 6-24 HRS) Nasopharyngeal Nasopharyngeal Swab     Status: None   Collection Time: 05/17/19  6:02 PM   Specimen: Nasopharyngeal Swab  Result Value Ref Range Status   SARS Coronavirus 2 NEGATIVE NEGATIVE Final    Comment: (NOTE) SARS-CoV-2 target nucleic acids are NOT DETECTED. The SARS-CoV-2 RNA is generally detectable in upper and lower respiratory specimens during the acute phase of infection. Negative results do not preclude SARS-CoV-2 infection, do not rule out co-infections with other pathogens, and should not be used as the sole basis for treatment or other patient management decisions. Negative results must be combined with clinical observations, patient history, and epidemiological information. The expected result is Negative. Fact Sheet for Patients: SugarRoll.be Fact Sheet for Healthcare  Providers: https://www.woods-mathews.com/ This test is not yet approved or cleared by the [place] FDA and  has been authorized for detection and/or diagnosis of SARS-CoV-2 by FDA under an Emergency Use Authorization (EUA). This EUA will remain  in effect (meaning this test can be used) for the duration of the COVID-19 declaration under Section 56 4(b)(1) of the Act, 21 U.S.C. section 360bbb-3(b)(1), unless the authorization is terminated or revoked sooner. Performed at [facility], [place] [address], [place], [place] [address]   MRSA PCR Screening     Status: None   Collection Time: 05/18/19  5:41 PM   Specimen: Nasal Mucosa; Nasopharyngeal  Result Value Ref Range Status   MRSA by PCR NEGATIVE NEGATIVE Final    Comment:        The GeneXpert MRSA Assay (FDA approved for NASAL specimens only), is one component of a comprehensive MRSA colonization surveillance program. It is not intended to diagnose MRSA infection nor to guide or monitor treatment for MRSA infections. Performed at [facility], [place] [address], [place], [place] [address]     Radiology Reports DG Chest [place] 1 View  Result Date: 05/17/2019 CLINICAL DATA:  Shortness of breath EXAM: PORTABLE CHEST 1 VIEW COMPARISON:  03/13/2019 FINDINGS: Cardiac shadow is enlarged but stable. Aortic calcifications are seen. Right chest wall port is noted. Increasing vascular congestion is noted with parenchymal edema worse on the right than the left. No bony abnormality is seen. IMPRESSION: Increasing vascular congestion and parenchymal edema. Electronically Signed   By: Inez Catalina M.D.   On: 05/17/2019 15:43   ECHOCARDIOGRAM COMPLETE  Result Date: 05/18/2019    ECHOCARDIOGRAM REPORT   Patient Name:   RAELIN PIXLER Date of Exam: 05/18/2019 Medical Rec #:  [id number]         Height:       62.0 in Accession #:    [id number]        Weight:       134.5 lb Date of Birth:  04-02-1927         BSA:           1.615 m Patient Age:    71 years          BP:  154/69 mmHg Patient Gender: F                 HR:           74 bpm. Exam Location:  Inpatient Procedure: 2D Echo, Cardiac Doppler and Color Doppler Indications:    I50.23 Acute on chronic systolic (congestive) heart failure  History:        Patient has prior history of Echocardiogram examinations, most                 recent 10/19/2018. TIA; Risk Factors:Hypertension and                 Dyslipidemia. Cancer. GERD. Hypothyroidism.  Sonographer:    Jonelle Sidle Dance Referring Phys: [id number] Muaad Boehning K Tsuyako Jolley IMPRESSIONS  1. Left ventricular ejection fraction, by estimation, is 60 to 65%. The left ventricle has normal function. The left ventricle has no regional wall motion abnormalities. Left ventricular diastolic parameters are consistent with Grade II diastolic dysfunction (pseudonormalization).  2. Right ventricular systolic function is mildly reduced. The right ventricular size is normal. There is mildly elevated pulmonary artery systolic pressure.  3. Left atrial size was moderately dilated.  4. The mitral valve is grossly normal. Mild mitral valve regurgitation. No evidence of mitral stenosis.  5. The aortic valve is tricuspid. Aortic valve regurgitation is trivial. No aortic stenosis is present. FINDINGS  Left Ventricle: Left ventricular ejection fraction, by estimation, is 60 to 65%. The left ventricle has normal function. The left ventricle has no regional wall motion abnormalities. The left ventricular internal cavity size was normal in size. There is  no left ventricular hypertrophy. Left ventricular diastolic parameters are consistent with Grade II diastolic dysfunction (pseudonormalization). Right Ventricle: The right ventricular size is normal. No increase in right ventricular wall thickness. Right ventricular systolic function is mildly reduced. There is mildly elevated pulmonary artery systolic pressure. The tricuspid regurgitant velocity  is 2.79 m/s,  and with an assumed right atrial pressure of 3 mmHg, the estimated right ventricular systolic pressure is 97.9 mmHg. Left Atrium: Left atrial size was moderately dilated. Right Atrium: Right atrial size was normal in size. Pericardium: There is no evidence of pericardial effusion. Mitral Valve: The mitral valve is grossly normal. Moderate mitral annular calcification. Mild mitral valve regurgitation. No evidence of mitral valve stenosis. Tricuspid Valve: The tricuspid valve is normal in structure. Tricuspid valve regurgitation is mild . No evidence of tricuspid stenosis. Aortic Valve: The aortic valve is tricuspid. Aortic valve regurgitation is trivial. Aortic regurgitation PHT measures 454 msec. No aortic stenosis is present. Pulmonic Valve: The pulmonic valve was normal in structure. Pulmonic valve regurgitation is not visualized. No evidence of pulmonic stenosis. Aorta: The aortic root and ascending aorta are structurally normal, with no evidence of dilitation. IAS/Shunts: The atrial septum is grossly normal.  LEFT VENTRICLE PLAX 2D LVIDd:         4.70 cm  Diastology LVIDs:         3.20 cm  LV e' lateral:   8.05 cm/s LV PW:         1.10 cm  LV E/e' lateral: 14.4 LV IVS:        1.00 cm  LV e' medial:    4.79 cm/s LVOT diam:     2.10 cm  LV E/e' medial:  24.2 LV SV:         70 LV SV Index:   43 LVOT Area:     3.46 cm  RIGHT VENTRICLE  IVC RV Basal diam:  2.10 cm     IVC diam: 1.70 cm RV S prime:     10.70 cm/s TAPSE (M-mode): 1.7 cm LEFT ATRIUM             Index       RIGHT ATRIUM          Index LA diam:        4.30 cm 2.66 cm/m  RA Area:     8.81 cm LA Vol (A2C):   67.1 ml 41.55 ml/m RA Volume:   12.50 ml 7.74 ml/m LA Vol (A4C):   70.9 ml 43.90 ml/m LA Biplane Vol: 68.8 ml 42.60 ml/m  AORTIC VALVE LVOT Vmax:   82.50 cm/s LVOT Vmean:  53.800 cm/s LVOT VTI:    0.202 m AI PHT:      454 msec  AORTA Ao Root diam: 3.20 cm Ao Asc diam:  2.80 cm MITRAL VALVE                TRICUSPID VALVE MV Area (PHT):  3.77 cm     TR Peak grad:   31.1 mmHg MV Decel Time: 201 msec     TR Vmax:        279.00 cm/s MV E velocity: 116.00 cm/s MV A velocity: 81.90 cm/s   SHUNTS MV E/A ratio:  1.42         Systemic VTI:  0.20 m                             Systemic Diam: 2.10 cm Mertie Moores MD Electronically signed by Mertie Moores MD Signature Date/Time: 05/18/2019/11:20:46 AM    Final     Lab Data:  CBC: Recent Labs  Lab 05/17/19 1529 05/18/19 0430 05/19/19 0233  WBC 13.9* 12.2* 10.0  HGB 8.7* 7.5* 7.2*  HCT 28.2* 24.9* 23.7*  MCV 102.9* 102.9* 103.9*  PLT 371 304 341   Basic Metabolic Panel: Recent Labs  Lab 05/16/19 1040 05/17/19 1529 05/18/19 0430 05/18/19 0810 05/19/19 0233  NA 139 137 137  --  139  K 5.2* 5.5* 4.7  --  4.0  CL 108 107 105  --  104  CO2 22 21* 21*  --  24  GLUCOSE 126* 139* 104*  --  91  BUN 41* 42* 41*  --  43*  CREATININE 1.46* 1.47* 1.54*  --  1.57*  CALCIUM 9.2 9.4 9.0  --  8.9  MG  --   --   --  2.5*  --    GFR: Estimated Creatinine Clearance: 20.5 mL/min (A) (by C-G formula based on SCr of 1.57 mg/dL (H)). Liver Function Tests: Recent Labs  Lab 05/17/19 1529  AST 28  ALT 17  ALKPHOS 65  BILITOT 1.1  PROT 7.1  ALBUMIN 3.6   No results for input(s): LIPASE, AMYLASE in the last 168 hours. No results for input(s): AMMONIA in the last 168 hours. Coagulation Profile: No results for input(s): INR, PROTIME in the last 168 hours. Cardiac Enzymes: No results for input(s): CKTOTAL, CKMB, CKMBINDEX, TROPONINI in the last 168 hours. BNP (last 3 results) No results for input(s): PROBNP in the last 8760 hours. HbA1C: No results for input(s): HGBA1C in the last 72 hours. CBG: No results for input(s): GLUCAP in the last 168 hours. Lipid Profile: No results for input(s): CHOL, HDL, LDLCALC, TRIG, CHOLHDL, LDLDIRECT in the last 72 hours. Thyroid Function Tests: Recent Labs  05/18/19 0430  TSH 4.626*   Anemia Panel: Recent Labs    05/18/19 1204   VITAMINB12 416  FOLATE 70.9  FERRITIN 186  TIBC 320  IRON 28  RETICCTPCT 4.9*   Urine analysis:    Component Value Date/Time   COLORURINE YELLOW 03/13/2019 1305   APPEARANCEUR HAZY (A) 03/13/2019 1305   LABSPEC 1.020 03/13/2019 1305   PHURINE 5.0 03/13/2019 1305   GLUCOSEU NEGATIVE 03/13/2019 1305   HGBUR MODERATE (A) 03/13/2019 1305   BILIRUBINUR NEGATIVE 03/13/2019 1305   KETONESUR NEGATIVE 03/13/2019 1305   PROTEINUR >=300 (A) 03/13/2019 1305   NITRITE NEGATIVE 03/13/2019 1305   LEUKOCYTESUR TRACE (A) 03/13/2019 1305     Keland Peyton M.D. [facility] 05/19/2019, 11:45 AM   Call night coverage person covering after 7pm

## 2019-05-19 NOTE — Plan of Care (Signed)

## 2019-05-20 LAB — BASIC METABOLIC PANEL
Anion gap: 13 (ref 5–15)
BUN: 51 mg/dL — ABNORMAL HIGH (ref 8–23)
CO2: 25 mmol/L (ref 22–32)
Calcium: 8.6 mg/dL — ABNORMAL LOW (ref 8.9–10.3)
Chloride: 100 mmol/L (ref 98–111)
Creatinine, Ser: 1.93 mg/dL — ABNORMAL HIGH (ref 0.44–1.00)
GFR calc Af Amer: 26 mL/min — ABNORMAL LOW (ref >60)
GFR calc non Af Amer: 22 mL/min — ABNORMAL LOW (ref >60)
Glucose, Bld: 206 mg/dL — ABNORMAL HIGH (ref 70–99)
Potassium: 3.7 mmol/L (ref 3.5–5.1)
Sodium: 138 mmol/L (ref 135–145)

## 2019-05-20 LAB — BPAM RBC
Blood Product Expiration Date: 202103292359
ISSUE DATE / TIME: 202103131050
Unit Type and Rh: 6200

## 2019-05-20 LAB — CBC
HCT: 27.4 % — ABNORMAL LOW (ref 36.0–46.0)
Hemoglobin: 8.8 g/dL — ABNORMAL LOW (ref 12.0–15.0)
MCH: 31.1 pg (ref 26.0–34.0)
MCHC: 32.1 g/dL (ref 30.0–36.0)
MCV: 96.8 fL (ref 80.0–100.0)
Platelets: 302 10*3/uL (ref 150–400)
RBC: 2.83 MIL/uL — ABNORMAL LOW (ref 3.87–5.11)
RDW: 19 % — ABNORMAL HIGH (ref 11.5–15.5)
WBC: 9 10*3/uL (ref 4.0–10.5)
nRBC: 0 % (ref 0.0–0.2)

## 2019-05-20 LAB — TYPE AND SCREEN
ABO/RH(D): A POS
Antibody Screen: NEGATIVE
Unit division: 0

## 2019-05-20 MED ORDER — GABAPENTIN 300 MG PO CAPS
300.0000 mg | ORAL_CAPSULE | Freq: Two times a day (BID) | ORAL | Status: DC
  Administered 2019-05-20 – 2019-05-24 (×9): 300 mg via ORAL
  Filled 2019-05-20 (×9): qty 1

## 2019-05-20 MED ORDER — FUROSEMIDE 40 MG PO TABS
40.0000 mg | ORAL_TABLET | Freq: Every day | ORAL | Status: DC
  Administered 2019-05-21 – 2019-05-24 (×4): 40 mg via ORAL
  Filled 2019-05-20 (×4): qty 1

## 2019-05-20 MED ORDER — METHYLPREDNISOLONE SODIUM SUCC 40 MG IJ SOLR
40.0000 mg | Freq: Two times a day (BID) | INTRAMUSCULAR | Status: DC
  Administered 2019-05-20 – 2019-05-23 (×7): 40 mg via INTRAVENOUS
  Filled 2019-05-20 (×7): qty 1

## 2019-05-20 NOTE — Progress Notes (Signed)
Progress Note  Patient Name: Summer Nielsen Date of Encounter: 05/20/2019  Primary Cardiologist: Candee Furbish, MD   Patient Profile     86 y.o. female with metastatic cholangiocarcinoma pulmonary fibrosis admitted with anemia and hypoxemia (74% on 3 L).  Respiratory failure prompted the need for BiPAP.  With significant improvement. Antecedent edema, meds being held, felt to have component of acute on chronic congestive heart failure. 3/21 echocardiogram normal LV function-LAE Bipap off on 6 L  Subjective   Still with some dyspnea  Was aware of palps this am-- not familiar  Inpatient Medications    Scheduled Meds: . sodium chloride   Intravenous Once  . amLODipine  5 mg Oral Daily  . atorvastatin  20 mg Oral Daily  . benzonatate  100 mg Oral TID  . calcium-vitamin D  1 tablet Oral Daily  . chlorhexidine  15 mL Mouth Rinse BID  . Chlorhexidine Gluconate Cloth  6 each Topical Daily  . dicyclomine  20 mg Oral TID  . fluticasone furoate-vilanterol  1 puff Inhalation Daily  . furosemide  80 mg Intravenous Q12H  . gabapentin  300 mg Oral BID  . heparin  5,000 Units Subcutaneous Q8H  . ipratropium-albuterol  3 mL Nebulization BID  . latanoprost  1 drop Both Eyes QHS  . levothyroxine  150 mcg Oral QAC breakfast  . mouth rinse  15 mL Mouth Rinse q12n4p  . methylPREDNISolone (SOLU-MEDROL) injection  40 mg Intravenous Q12H  . pantoprazole  40 mg Oral Daily  . propranolol  20 mg Oral BID  . sodium chloride flush  10-40 mL Intracatheter Q12H  . sodium chloride flush  3 mL Intravenous Q12H   Continuous Infusions: . sodium chloride     PRN Meds: sodium chloride, acetaminophen, albuterol, guaiFENesin-dextromethorphan, hydrALAZINE, loperamide, ondansetron (ZOFRAN) IV, sodium chloride flush, sodium chloride flush   Vital Signs    Vitals:   05/20/19 0654 05/20/19 0800 05/20/19 0810 05/20/19 0858  BP:    (!) 150/99  Pulse:   94 86  Resp:   (!) 22   Temp:  97.6 F (36.4 C)     TempSrc:  Oral    SpO2:   90%   Weight: 59.7 kg     Height:        Intake/Output Summary (Last 24 hours) at 05/20/2019 1039 Last data filed at 05/20/2019 0903 Gross per 24 hour  Intake 1125 ml  Output 1350 ml  Net -225 ml   Last 3 Weights 05/20/2019 05/19/2019 05/08/2019  Weight (lbs) 131 lb 9.8 oz 134 lb 11.2 oz 134 lb 8 oz  Weight (kg) 59.7 kg 61.1 kg 61.009 kg      Telemetry    .Telemetry Personally reviewed  Atrial tach >> 150s ECG    Sinus rhythm- Personally Reviewed  Physical Exam    Well developed and nourished in no acute distress HENT normal Neck supple with JVP-  8 Crackles Regular rate and rhythm, no murmurs or gallops Abd-soft with active BS No Clubbing cyanosis tr edema Skin-warm and dry A & Oriented  Grossly normal sensory and motor function     Labs    High Sensitivity Troponin:   Recent Labs  Lab 05/17/19 1529 05/17/19 1806  TROPONINIHS 6 6      Chemistry Recent Labs  Lab 05/17/19 1529 05/17/19 1529 05/18/19 0430 05/19/19 0233 05/20/19 0500  NA 137   < > 137 139 138  K 5.5*   < > 4.7 4.0 3.7  CL  107   < > 105 104 100  CO2 21*   < > 21* 24 25  GLUCOSE 139*   < > 104* 91 206*  BUN 42*   < > 41* 43* 51*  CREATININE 1.47*   < > 1.54* 1.57* 1.93*  CALCIUM 9.4   < > 9.0 8.9 8.6*  PROT 7.1  --   --   --   --   ALBUMIN 3.6  --   --   --   --   AST 28  --   --   --   --   ALT 17  --   --   --   --   ALKPHOS 65  --   --   --   --   BILITOT 1.1  --   --   --   --   GFRNONAA 31*   < > 29* 29* 22*  GFRAA 36*   < > 34* 33* 26*  ANIONGAP 9   < > _0 < > = values in this interval not displayed.     Hematology Recent Labs  Lab 05/18/19 0430 05/18/19 0430 05/18/19 1204 05/19/19 0233 05/19/19 1540 05/20/19 0500  WBC 12.2*  --   --  10.0  --  9.0  RBC 2.42*  --  2.54* 2.28*  --  2.83*  HGB 7.5*   < >  --  7.2* 9.3* 8.8*  HCT 24.9*   < >  --  23.7* 29.2* 27.4*  MCV 102.9*  --   --  103.9*  --  96.8  MCH 31.0  --   --  31.6  --   31.1  MCHC 30.1  --   --  30.4  --  32.1  RDW 18.0*  --   --  18.2*  --  19.0*  PLT 304  --   --  283  --  302   < > = values in this interval not displayed.    BNP Recent Labs  Lab 05/17/19 1529  BNP 957.7*     DDimer No results for input(s): DDIMER in the last 168 hours.   Radiology    No results found.  Cardiac Studies   As above     Assessment & Plan    Respiratory failure  Congestive heart failure-diastolic-acute/chronic  Hypertension  Cholangiocarcinoma-metastatic  Renal insufficiency grade acute/chronic  3b.>4   Anemia chronic  With worsening renal insufficiency stop IV diuresis --also with R heart issues from pulm fibrosis, difficult to interpret L haert situation from R sided findings  Amlodipine just resumed, so not likely so much the culprit--  BP still poorly controlled,  May benefit from switching her propranolol to metoprolol  Will try- this is also B1 selective       For questions or updates, please contact [facility] Please consult [email or web address] for contact info under        Signed, Virl Axe, MD  05/20/2019, 10:39 AM

## 2019-05-20 NOTE — Progress Notes (Signed)
[facility]                                                                              Patient Demographics  Summer Nielsen, is a 86 y.o. female, DOB - 1928/02/06, UNG:[id number]  Admit date - 05/17/2019   Admitting Physician Meleana Commerford Krystal Eaton, MD  Outpatient Primary MD for the patient is Maurice Small, MD  Outpatient specialists:   LOS - 3  days   Medical records reviewed and are as summarized below:    Chief Complaint  Patient presents with  . 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  Patient reported that she was taken off of Lasix and losartan by pulmonology and oncology for contrasted CT scan imagings.  Patient has not had her Lasix for 1 week.  Patient presented with shortness of breath with minimal exertion, swelling in her lower extremities, orthopnea, hypoxia, O2 sats in 70s at home.  At baseline on 2 L O2 at home, initially placed on 5 L O2 in ED.  Received Lasix 40 mg IV x2 yesterday, overnight became more dyspneic and was placed on BiPAP. COVID-19 test negative.  Cardiology was consulted.  Patient was admitted for further work-up and aggressive diuresis  Assessment & Plan    Principal Problem:   Acute respiratory failure ([place]) with hypoxia likely secondary to  Acute on chronic diastolic CHF (congestive heart failure) ([place]) -Likely due to missing of Lasix at home, elevated BNP,+ JVD, orthopnea,, + lower extremity edema showed pulmonary edema.  Was briefly placed on BiPAP for respiratory distress and hypoxia.  -Cardiology consulted. 2D echo showed EF of 60 to 76%, grade 2 diastolic dysfunction -Initially placed on BiPAP, subsequently transitioned to 8 L high flow [place] (per pulmonology note on 3/11, on 3 L of O2 at home),  weaned to 6 L O2 via nasal cannula this a.m., wean O2 as tolerated -Patient was placed on Solu-Medrol due to wheezing, taper to 40 mg every 12 hours today, transition to oral prednisone in a.m. -Continue flutter valve, incentive spirometry, scheduled nebs -Patient was placed on IV diuresis, Lasix was increased to 80 mg IV every 12 hours, creatinine trending up to 1.9 today. -Transition to oral Lasix 40 mg daily -Negative balance of 3.4 L from 134.7-> 131 today  Active Problems:  history of TIA (transient ischemic attack) -Continue statin -Aspirin currently on hold    Postsurgical hypothyroidism -TSH 4.6 -Continue Synthroid    Hyperlipidemia -Continue statin    Hypertensive heart disease with heart failure (HCC) -BP currently poorly controlled, cardiology restarted Norvasc, continue Lasix, on beta-blocker -Continue hydralazine IV as needed with parameters  Anemia, macrocytic: Likely anemia of chronic disease, malignancy -Hemoglobin 7.2 on 3/13, anemia panel showed saturation ratio 9, iron 28, B12 416, folate 70.9.  Transfused 1 unit packed RBC on 3/13 -FOBT positive, continue to hold aspirin.  GI was consulted -Per GI, no EGD due to multiple medical comorbidities, acute CHF,  respiratory failure -H&H currently stable 8.8    Cholangiocarcinoma metastatic to liver [facility]), history of pulmonary fibrosis -Completed chemotherapy on 04/11/2019, was scheduled to have high-resolution chest CT outpatient for staging -follow-up with Dr. Benay Spice, she has a office visit scheduled on 05/22/2019  Hyperkalemia -Resolved potassium 3.7  History of C. difficile in 2020 -Patient states she has off-and-on diarrhea, no increase, no abdominal pain, fever chills  History of pulmonary fibrosis -Outpatient follow-up with pulmonology, currently being worked up for interstitial lung disease  Hyperglycemia -Secondary to steroids, tapering today  Code Status: Full CODE STATUS, discussed with the  patient DVT Prophylaxis: Heparin subcu Family Communication: Discussed all imaging results, lab results, explained to the patient and patient's son on the phone   Disposition Plan: Patient from home, anticipated discharge home once able to tolerate oral Lasix, hypoxia improving and successful weaning of O2 close to her baseline.  Patient transition to oral Lasix today due to creatinine trending up, still on IV steroids, precludes a safe discharge to home.  Transfer to telemetry floor today.  Time Spent in minutes   25-minutes  Procedures:  2D echo Consultants:   Cardiology  Antimicrobials:   Anti-infectives (From admission, onward)   None         Medications  Scheduled Meds: . sodium chloride   Intravenous Once  . amLODipine  5 mg Oral Daily  . atorvastatin  20 mg Oral Daily  . benzonatate  100 mg Oral TID  . calcium-vitamin D  1 tablet Oral Daily  . chlorhexidine  15 mL Mouth Rinse BID  . Chlorhexidine Gluconate Cloth  6 each Topical Daily  . dicyclomine  20 mg Oral TID  . fluticasone furoate-vilanterol  1 puff Inhalation Daily  . furosemide  40 mg Oral Daily  . gabapentin  300 mg Oral BID  . heparin  5,000 Units Subcutaneous Q8H  . ipratropium-albuterol  3 mL Nebulization BID  . latanoprost  1 drop Both Eyes QHS  . levothyroxine  150 mcg Oral QAC breakfast  . mouth rinse  15 mL Mouth Rinse q12n4p  . methylPREDNISolone (SOLU-MEDROL) injection  40 mg Intravenous Q12H  . pantoprazole  40 mg Oral Daily  . propranolol  20 mg Oral BID  . sodium chloride flush  10-40 mL Intracatheter Q12H  . sodium chloride flush  3 mL Intravenous Q12H   Continuous Infusions: . sodium chloride     PRN Meds:.sodium chloride, acetaminophen, albuterol, guaiFENesin-dextromethorphan, hydrALAZINE, loperamide, ondansetron (ZOFRAN) IV, sodium chloride flush, sodium chloride flush      Subjective:   Summer Nielsen was seen and examined today.  Feeling much better today, on 6 L O2 via  nasal cannula, weaned to 5 L during the encounter.  Patient feels much improvement in her breathing from admission.  Abdomen less bloated.  No fevers.  Ankles swelling is also improving.  No nausea vomiting diarrhea or abdominal pain.  No fevers  Objective:   Vitals:   05/20/19 0858 05/20/19 0900 05/20/19 1000 05/20/19 1100  BP: (!) 150/99 (!) 149/75 (!) 153/58 (!) 149/52  Pulse: 86 85 76 66  Resp:  (!) 25 (!) 21 14  Temp:      TempSrc:      SpO2:  93% 94% 98%  Weight:      Height:        Intake/Output Summary (Last 24 hours) at 05/20/2019 1242 Last data filed at 05/20/2019 0903 Gross per 24 hour  Intake 645 ml  Output 700 ml  Net -  55 ml     Wt Readings from Last 3 Encounters:  05/20/19 59.7 kg  05/08/19 61 kg  05/02/19 60.7 kg   Physical Exam  General: Alert and oriented x 3, NAD  Cardiovascular: S1 S2 clear, RRR. 1+ pedal edema b/l  Respiratory: Fine bibasilar crackles, mild scattered wheezing  Gastrointestinal: Soft, nontender, nondistended, NBS  Ext: 1+ pedal edema bilaterally  Neuro: no new deficits  Musculoskeletal: No cyanosis, clubbing  Skin: No rashes  Psych: Normal affect and demeanor, alert and oriented x3      Data Reviewed:  I have personally reviewed following labs and imaging studies  Micro Results Recent Results (from the past 240 hour(s))  SARS CORONAVIRUS 2 (TAT 6-24 HRS) Nasopharyngeal Nasopharyngeal Swab     Status: None   Collection Time: 05/17/19  6:02 PM   Specimen: Nasopharyngeal Swab  Result Value Ref Range Status   SARS Coronavirus 2 NEGATIVE NEGATIVE Final    Comment: (NOTE) SARS-CoV-2 target nucleic acids are NOT DETECTED. The SARS-CoV-2 RNA is generally detectable in upper and lower respiratory specimens during the acute phase of infection. Negative results do not preclude SARS-CoV-2 infection, do not rule out co-infections with other pathogens, and should not be used as the sole basis for treatment or other patient  management decisions. Negative results must be combined with clinical observations, patient history, and epidemiological information. The expected result is Negative. Fact Sheet for Patients: SugarRoll.be Fact Sheet for Healthcare Providers: https://www.woods-mathews.com/ This test is not yet approved or cleared by the [place] FDA and  has been authorized for detection and/or diagnosis of SARS-CoV-2 by FDA under an Emergency Use Authorization (EUA). This EUA will remain  in effect (meaning this test can be used) for the duration of the COVID-19 declaration under Section 56 4(b)(1) of the Act, 21 U.S.C. section 360bbb-3(b)(1), unless the authorization is terminated or revoked sooner. Performed at [facility], [place] [address], [place], [place] [address]   MRSA PCR Screening     Status: None   Collection Time: 05/18/19  5:41 PM   Specimen: Nasal Mucosa; Nasopharyngeal  Result Value Ref Range Status   MRSA by PCR NEGATIVE NEGATIVE Final    Comment:        The GeneXpert MRSA Assay (FDA approved for NASAL specimens only), is one component of a comprehensive MRSA colonization surveillance program. It is not intended to diagnose MRSA infection nor to guide or monitor treatment for MRSA infections. Performed at [facility], [place] [address], [place], [place] [address]     Radiology Reports DG Chest [place] 1 View  Result Date: 05/17/2019 CLINICAL DATA:  Shortness of breath EXAM: PORTABLE CHEST 1 VIEW COMPARISON:  03/13/2019 FINDINGS: Cardiac shadow is enlarged but stable. Aortic calcifications are seen. Right chest wall port is noted. Increasing vascular congestion is noted with parenchymal edema worse on the right than the left. No bony abnormality is seen. IMPRESSION: Increasing vascular congestion and parenchymal edema. Electronically Signed   By: Inez Catalina M.D.   On: 05/17/2019 15:43   ECHOCARDIOGRAM  COMPLETE  Result Date: 05/18/2019    ECHOCARDIOGRAM REPORT   Patient Name:   MARYAGNES CARRASCO Date of Exam: 05/18/2019 Medical Rec #:  [id number]         Height:       62.0 in Accession #:    [id number]        Weight:       134.5 lb Date of Birth:  [birth year]  BSA:          1.615 m Patient Age:    73 years          BP:           154/69 mmHg Patient Gender: F                 HR:           74 bpm. Exam Location:  Inpatient Procedure: 2D Echo, Cardiac Doppler and Color Doppler Indications:    I50.23 Acute on chronic systolic (congestive) heart failure  History:        Patient has prior history of Echocardiogram examinations, most                 recent 10/19/2018. TIA; Risk Factors:Hypertension and                 Dyslipidemia. Cancer. GERD. Hypothyroidism.  Sonographer:    Jonelle Sidle Dance Referring Phys: [id number] K Keimora Swartout IMPRESSIONS  1. Left ventricular ejection fraction, by estimation, is 60 to 65%. The left ventricle has normal function. The left ventricle has no regional wall motion abnormalities. Left ventricular diastolic parameters are consistent with Grade II diastolic dysfunction (pseudonormalization).  2. Right ventricular systolic function is mildly reduced. The right ventricular size is normal. There is mildly elevated pulmonary artery systolic pressure.  3. Left atrial size was moderately dilated.  4. The mitral valve is grossly normal. Mild mitral valve regurgitation. No evidence of mitral stenosis.  5. The aortic valve is tricuspid. Aortic valve regurgitation is trivial. No aortic stenosis is present. FINDINGS  Left Ventricle: Left ventricular ejection fraction, by estimation, is 60 to 65%. The left ventricle has normal function. The left ventricle has no regional wall motion abnormalities. The left ventricular internal cavity size was normal in size. There is  no left ventricular hypertrophy. Left ventricular diastolic parameters are consistent with Grade II diastolic dysfunction  (pseudonormalization). Right Ventricle: The right ventricular size is normal. No increase in right ventricular wall thickness. Right ventricular systolic function is mildly reduced. There is mildly elevated pulmonary artery systolic pressure. The tricuspid regurgitant velocity  is 2.79 m/s, and with an assumed right atrial pressure of 3 mmHg, the estimated right ventricular systolic pressure is 24.0 mmHg. Left Atrium: Left atrial size was moderately dilated. Right Atrium: Right atrial size was normal in size. Pericardium: There is no evidence of pericardial effusion. Mitral Valve: The mitral valve is grossly normal. Moderate mitral annular calcification. Mild mitral valve regurgitation. No evidence of mitral valve stenosis. Tricuspid Valve: The tricuspid valve is normal in structure. Tricuspid valve regurgitation is mild . No evidence of tricuspid stenosis. Aortic Valve: The aortic valve is tricuspid. Aortic valve regurgitation is trivial. Aortic regurgitation PHT measures 454 msec. No aortic stenosis is present. Pulmonic Valve: The pulmonic valve was normal in structure. Pulmonic valve regurgitation is not visualized. No evidence of pulmonic stenosis. Aorta: The aortic root and ascending aorta are structurally normal, with no evidence of dilitation. IAS/Shunts: The atrial septum is grossly normal.  LEFT VENTRICLE PLAX 2D LVIDd:         4.70 cm  Diastology LVIDs:         3.20 cm  LV e' lateral:   8.05 cm/s LV PW:         1.10 cm  LV E/e' lateral: 14.4 LV IVS:        1.00 cm  LV e' medial:    4.79 cm/s LVOT diam:  2.10 cm  LV E/e' medial:  24.2 LV SV:         70 LV SV Index:   43 LVOT Area:     3.46 cm  RIGHT VENTRICLE             IVC RV Basal diam:  2.10 cm     IVC diam: 1.70 cm RV S prime:     10.70 cm/s TAPSE (M-mode): 1.7 cm LEFT ATRIUM             Index       RIGHT ATRIUM          Index LA diam:        4.30 cm 2.66 cm/m  RA Area:     8.81 cm LA Vol (A2C):   67.1 ml 41.55 ml/m RA Volume:   12.50 ml 7.74  ml/m LA Vol (A4C):   70.9 ml 43.90 ml/m LA Biplane Vol: 68.8 ml 42.60 ml/m  AORTIC VALVE LVOT Vmax:   82.50 cm/s LVOT Vmean:  53.800 cm/s LVOT VTI:    0.202 m AI PHT:      454 msec  AORTA Ao Root diam: 3.20 cm Ao Asc diam:  2.80 cm MITRAL VALVE                TRICUSPID VALVE MV Area (PHT): 3.77 cm     TR Peak grad:   31.1 mmHg MV Decel Time: 201 msec     TR Vmax:        279.00 cm/s MV E velocity: 116.00 cm/s MV A velocity: 81.90 cm/s   SHUNTS MV E/A ratio:  1.42         Systemic VTI:  0.20 m                             Systemic Diam: 2.10 cm Mertie Moores MD Electronically signed by Mertie Moores MD Signature Date/Time: 05/18/2019/11:20:46 AM    Final     Lab Data:  CBC: Recent Labs  Lab 05/17/19 1529 05/18/19 0430 05/19/19 0233 05/19/19 1540 05/20/19 0500  WBC 13.9* 12.2* 10.0  --  9.0  HGB 8.7* 7.5* 7.2* 9.3* 8.8*  HCT 28.2* 24.9* 23.7* 29.2* 27.4*  MCV 102.9* 102.9* 103.9*  --  96.8  PLT 371 304 283  --  427   Basic Metabolic Panel: Recent Labs  Lab 05/16/19 1040 05/17/19 1529 05/18/19 0430 05/18/19 0810 05/19/19 0233 05/20/19 0500  NA 139 137 137  --  139 138  K 5.2* 5.5* 4.7  --  4.0 3.7  CL 108 107 105  --  104 100  CO2 22 21* 21*  --  24 25  GLUCOSE 126* 139* 104*  --  91 206*  BUN 41* 42* 41*  --  43* 51*  CREATININE 1.46* 1.47* 1.54*  --  1.57* 1.93*  CALCIUM 9.2 9.4 9.0  --  8.9 8.6*  MG  --   --   --  2.5*  --   --    GFR: Estimated Creatinine Clearance: 15.3 mL/min (A) (by C-G formula based on SCr of 1.93 mg/dL (H)). Liver Function Tests: Recent Labs  Lab 05/17/19 1529  AST 28  ALT 17  ALKPHOS 65  BILITOT 1.1  PROT 7.1  ALBUMIN 3.6   No results for input(s): LIPASE, AMYLASE in the last 168 hours. No results for input(s): AMMONIA in the last 168 hours. Coagulation Profile: No results for input(s):  INR, PROTIME in the last 168 hours. Cardiac Enzymes: No results for input(s): CKTOTAL, CKMB, CKMBINDEX, TROPONINI in the last 168 hours. BNP (last 3  results) No results for input(s): PROBNP in the last 8760 hours. HbA1C: No results for input(s): HGBA1C in the last 72 hours. CBG: No results for input(s): GLUCAP in the last 168 hours. Lipid Profile: No results for input(s): CHOL, HDL, LDLCALC, TRIG, CHOLHDL, LDLDIRECT in the last 72 hours. Thyroid Function Tests: Recent Labs    05/18/19 0430  TSH 4.626*   Anemia Panel: Recent Labs    05/18/19 1204  VITAMINB12 416  FOLATE 70.9  FERRITIN 186  TIBC 320  IRON 28  RETICCTPCT 4.9*   Urine analysis:    Component Value Date/Time   COLORURINE YELLOW 03/13/2019 1305   APPEARANCEUR HAZY (A) 03/13/2019 1305   LABSPEC 1.020 03/13/2019 1305   PHURINE 5.0 03/13/2019 1305   GLUCOSEU NEGATIVE 03/13/2019 1305   HGBUR MODERATE (A) 03/13/2019 1305   BILIRUBINUR NEGATIVE 03/13/2019 [place] 03/13/2019 1305   PROTEINUR >=300 (A) 03/13/2019 1305   NITRITE NEGATIVE 03/13/2019 1305   LEUKOCYTESUR TRACE (A) 03/13/2019 1305     Talyah Seder M.D. [facility] 05/20/2019, 12:42 PM   Call night coverage person covering after 7pm

## 2019-05-21 ENCOUNTER — Inpatient Hospital Stay (HOSPITAL_COMMUNITY): Payer: Medicare PPO

## 2019-05-21 DIAGNOSIS — M7989 Other specified soft tissue disorders: Secondary | ICD-10-CM

## 2019-05-21 DIAGNOSIS — I5033 Acute on chronic diastolic (congestive) heart failure: Secondary | ICD-10-CM

## 2019-05-21 DIAGNOSIS — J9600 Acute respiratory failure, unspecified whether with hypoxia or hypercapnia: Secondary | ICD-10-CM

## 2019-05-21 DIAGNOSIS — J849 Interstitial pulmonary disease, unspecified: Secondary | ICD-10-CM

## 2019-05-21 DIAGNOSIS — C787 Secondary malignant neoplasm of liver and intrahepatic bile duct: Secondary | ICD-10-CM

## 2019-05-21 DIAGNOSIS — C221 Intrahepatic bile duct carcinoma: Secondary | ICD-10-CM

## 2019-05-21 LAB — BASIC METABOLIC PANEL
Anion gap: 11 (ref 5–15)
BUN: 54 mg/dL — ABNORMAL HIGH (ref 8–23)
CO2: 26 mmol/L (ref 22–32)
Calcium: 8.1 mg/dL — ABNORMAL LOW (ref 8.9–10.3)
Chloride: 98 mmol/L (ref 98–111)
Creatinine, Ser: 1.68 mg/dL — ABNORMAL HIGH (ref 0.44–1.00)
GFR calc Af Amer: 31 mL/min — ABNORMAL LOW (ref >60)
GFR calc non Af Amer: 26 mL/min — ABNORMAL LOW (ref >60)
Glucose, Bld: 142 mg/dL — ABNORMAL HIGH (ref 70–99)
Potassium: 3.4 mmol/L — ABNORMAL LOW (ref 3.5–5.1)
Sodium: 135 mmol/L (ref 135–145)

## 2019-05-21 LAB — RESPIRATORY PANEL BY PCR

## 2019-05-21 LAB — CBC
HCT: 26.8 % — ABNORMAL LOW (ref 36.0–46.0)
Hemoglobin: 8.4 g/dL — ABNORMAL LOW (ref 12.0–15.0)
MCH: 30.3 pg (ref 26.0–34.0)
MCHC: 31.3 g/dL (ref 30.0–36.0)
MCV: 96.8 fL (ref 80.0–100.0)
Platelets: 313 10*3/uL (ref 150–400)
RBC: 2.77 MIL/uL — ABNORMAL LOW (ref 3.87–5.11)
RDW: 18.2 % — ABNORMAL HIGH (ref 11.5–15.5)
WBC: 12.8 10*3/uL — ABNORMAL HIGH (ref 4.0–10.5)
nRBC: 0 % (ref 0.0–0.2)

## 2019-05-21 LAB — EXPECTORATED SPUTUM ASSESSMENT W GRAM STAIN, RFLX TO RESP C

## 2019-05-21 MED ORDER — POTASSIUM CHLORIDE CRYS ER 20 MEQ PO TBCR
40.0000 meq | EXTENDED_RELEASE_TABLET | Freq: Once | ORAL | Status: AC
  Administered 2019-05-21: 12:00:00 40 meq via ORAL
  Filled 2019-05-21: qty 2

## 2019-05-21 MED ORDER — METOPROLOL TARTRATE 25 MG PO TABS
25.0000 mg | ORAL_TABLET | Freq: Two times a day (BID) | ORAL | Status: DC
  Administered 2019-05-21 (×2): 25 mg via ORAL
  Filled 2019-05-21 (×2): qty 1

## 2019-05-21 NOTE — Progress Notes (Signed)
[facility]                                                                              Patient Demographics  Summer Nielsen, is a 86 y.o. female, DOB - 1927-12-07, GYK:[id number]  Admit date - 05/17/2019   Admitting Physician Summer Petsch Krystal Eaton, MD  Outpatient Primary MD for the patient is Summer Small, MD  Outpatient specialists:   LOS - 4  days   Medical records reviewed and are as summarized below:    Chief Complaint  Patient presents with  . 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  Patient reported that she was taken off of Lasix and losartan by pulmonology and oncology for contrasted CT scan imagings.  Patient has not had her Lasix for 1 week.  Patient presented with shortness of breath with minimal exertion, swelling in her lower extremities, orthopnea, hypoxia, O2 sats in 70s at home.  At baseline on 2 L O2 at home, initially placed on 5 L O2 in ED.  Received Lasix 40 mg IV x2 yesterday, overnight became more dyspneic and was placed on BiPAP. COVID-19 test negative.  Cardiology was consulted.  Patient was admitted for further work-up and aggressive diuresis  Assessment & Plan    Principal Problem:   Acute respiratory failure (Summer Nielsen) with hypoxia likely secondary to  Acute on chronic diastolic CHF (congestive heart failure) (Summer Nielsen), has underlying history of pulmonary fibrosis, was being worked up for interstitial lung disease -Likely due to missing of Lasix at home, elevated BNP,+ JVD, orthopnea,, + lower extremity edema showed pulmonary edema.  Was briefly placed on BiPAP for respiratory distress and hypoxia.  -Cardiology consulted. 2D echo showed EF of 60 to 79%, grade 2 diastolic dysfunction -Initially placed on BiPAP,  subsequently transitioned to 8 L high flow [place], per patient on 4 L O2 at home at baseline.  Currently on 6 L O2, has plateaued, difficult weaning -On IV Solu-Medrol 40 mg every 12 hours  -Continue flutter valve, incentive spirometry, scheduled nebs -Patient was placed on IV Lasix 80 mg every 12 hours, transitioned to oral Lasix.  Negative balance of 5.0 L -Negative balance of 3.4 L from 134.7-> 130 today -Difficult weaning, still on 6 L O2, discussed with pulmonology, Summer Nielsen, recommended rule out DVT/PE -Venous Dopplers negative for DVT, will follow pulmonology recommendations regarding CTA chest.  VQ scan not an option due to underlying lung disease. -Repeat chest x-ray today showed bilateral lung opacities right greater than left concerning for edema or possible inflammation, Nielsen left pleural effusion  Active Problems:  history of TIA (transient ischemic attack) -Continue statin -Aspirin currently on hold, FOBT positive    Postsurgical hypothyroidism -TSH 4.6 Continue Synthroid    Hyperlipidemia -Continue statin    Hypertensive heart disease with heart failure (Summer Nielsen), NSVT -Cardiology following, changed propranolol to metoprolol 25 mg twice daily  -Continue Norvasc, Lasix  -Continue hydralazine IV as needed with  parameters  Anemia, macrocytic: Likely anemia of chronic disease, malignancy -Hemoglobin 7.2 on 3/13, anemia panel showed saturation ratio 9, iron 28, B12 416, folate 70.9.  Transfused 1 unit packed RBC on 3/13 -FOBT positive, continue to hold aspirin.  GI was consulted -Per GI, no EGD due to multiple medical comorbidities, acute CHF, respiratory failure -H&H currently stable 8.8    Cholangiocarcinoma metastatic to liver Summer Nielsen), history of pulmonary fibrosis -Completed chemotherapy on 04/11/2019, was scheduled to have high-resolution chest CT outpatient for staging -follow-up with Summer Nielsen, she has a office visit scheduled on 05/22/2019  Hyperkalemia -Resolved  potassium 3.7  History of C. difficile in 2020 -Patient states she has off-and-on diarrhea, no increase, no abdominal pain, fever chills  History of pulmonary fibrosis -As #1, pulmonology consulted  Hyperglycemia -CBGs improving with tapering of steroids  Code Status: Full CODE STATUS, discussed with the patient DVT Prophylaxis: Heparin subcu Family Communication: Discussed all imaging results, lab results, explained to the patient and patient's son on the phone   Disposition Plan: Patient from home, anticipated discharge home once able to tolerate oral Lasix, hypoxia improving and successful weaning of O2 close to her baseline.  O2 still 6 L, not at baseline. On IV steroids, precludes a safe discharge to home.   Time Spent in minutes   25-minutes  Procedures:  2D echo Consultants:   Cardiology  Antimicrobials:   Anti-infectives (From admission, onward)   None         Medications  Scheduled Meds: . sodium chloride   Intravenous Once  . amLODipine  5 mg Oral Daily  . atorvastatin  20 mg Oral Daily  . benzonatate  100 mg Oral TID  . calcium-vitamin D  1 tablet Oral Daily  . chlorhexidine  15 mL Mouth Rinse BID  . Chlorhexidine Gluconate Cloth  6 each Topical Daily  . dicyclomine  20 mg Oral TID  . fluticasone furoate-vilanterol  1 puff Inhalation Daily  . furosemide  40 mg Oral Daily  . gabapentin  300 mg Oral BID  . heparin  5,000 Units Subcutaneous Q8H  . ipratropium-albuterol  3 mL Nebulization BID  . latanoprost  1 drop Both Eyes QHS  . levothyroxine  150 mcg Oral QAC breakfast  . mouth rinse  15 mL Mouth Rinse q12n4p  . methylPREDNISolone (SOLU-MEDROL) injection  40 mg Intravenous Q12H  . metoprolol tartrate  25 mg Oral BID  . pantoprazole  40 mg Oral Daily  . sodium chloride flush  10-40 mL Intracatheter Q12H  . sodium chloride flush  3 mL Intravenous Q12H   Continuous Infusions: . sodium chloride     PRN Meds:.sodium chloride, acetaminophen,  albuterol, guaiFENesin-dextromethorphan, hydrALAZINE, loperamide, ondansetron (ZOFRAN) IV, sodium chloride flush, sodium chloride flush      Subjective:   Summer Nielsen was seen and examined today.  Feeling a lot better from admission, shortness of breath is improving, ankle swelling has a lot improved.  Coughing a lot, productive phlegm and episode of hemoptysis this morning.  However still at 6 L O2 via nasal cannula, (baseline of 4L).  Abdomen less bloated.  No fevers.  Ankles swelling is also improving.  No nausea vomiting diarrhea or abdominal pain.  No fevers  Objective:   Vitals:   05/20/19 2107 05/21/19 0459 05/21/19 0914 05/21/19 0959  BP: (!) 141/58 (!) 162/68  (!) 150/67  Pulse: 74 72  73  Resp: 18   20  Temp: 97.9 F (36.6 C) 97.8 F (36.6 C)  97.7 F (36.5 C)  TempSrc: Oral Oral  Oral  SpO2: 93%  91% 91%  Weight:  59.3 kg    Height:        Intake/Output Summary (Last 24 hours) at 05/21/2019 1338 Last data filed at 05/21/2019 0501 Gross per 24 hour  Intake 120 ml  Output 1250 ml  Net -1130 ml     Wt Readings from Last 3 Encounters:  05/21/19 59.3 kg  05/08/19 61 kg  05/02/19 60.7 kg    Physical Exam  General: Alert and oriented x 3, NAD  Cardiovascular: S1 S2 clear, RRR. No pedal edema b/l  Respiratory:  fine bibasilar crackles, mild scattered wheezing  Gastrointestinal: Soft, nontender, nondistended, NBS  Ext: no pedal edema bilaterally  Neuro: no new deficits  Musculoskeletal: No cyanosis, clubbing  Skin: No rashes  Psych: Normal affect and demeanor, alert and oriented x3         Data Reviewed:  I have personally reviewed following labs and imaging studies  Micro Results Recent Results (from the past 240 hour(s))  SARS CORONAVIRUS 2 (TAT 6-24 HRS) Nasopharyngeal Nasopharyngeal Swab     Status: None   Collection Time: 05/17/19  6:02 PM   Specimen: Nasopharyngeal Swab  Result Value Ref Range Status   SARS Coronavirus 2 NEGATIVE  NEGATIVE Final    Comment: (NOTE) SARS-CoV-2 target nucleic acids are NOT DETECTED. The SARS-CoV-2 RNA is generally detectable in upper and lower respiratory specimens during the acute phase of infection. Negative results do not preclude SARS-CoV-2 infection, do not rule out co-infections with other pathogens, and should not be used as the sole basis for treatment or other patient management decisions. Negative results must be combined with clinical observations, patient history, and epidemiological information. The expected result is Negative. Fact Sheet for Patients: SugarRoll.be Fact Sheet for Healthcare Providers: https://www.woods-mathews.com/ This test is not yet approved or cleared by the [place] FDA and  has been authorized for detection and/or diagnosis of SARS-CoV-2 by FDA under an Emergency Use Authorization (EUA). This EUA will remain  in effect (meaning this test can be used) for the duration of the COVID-19 declaration under Section 56 4(b)(1) of the Act, 21 U.S.C. section 360bbb-3(b)(1), unless the authorization is terminated or revoked sooner. Performed at [facility], [place] [address] [place], [place] [address]   MRSA PCR Screening     Status: None   Collection Time: 05/18/19  5:41 PM   Specimen: Nasal Mucosa; Nasopharyngeal  Result Value Ref Range Status   MRSA by PCR NEGATIVE NEGATIVE Final    Comment:        The GeneXpert MRSA Assay (FDA approved for NASAL specimens only), is one component of a comprehensive MRSA colonization surveillance program. It is not intended to diagnose MRSA infection nor to guide or monitor treatment for MRSA infections. Performed at [facility], [place] [address] [place], [place] [address]     Radiology Reports DG Chest 2 View  Result Date: 05/21/2019 CLINICAL DATA:  Shortness of breath. EXAM: CHEST - 2 VIEW COMPARISON:  May 17, 2019. FINDINGS: Stable  cardiomediastinal silhouette. Atherosclerosis of thoracic aorta is noted. Right internal jugular Port-A-Cath is unchanged in position. Stable bilateral lung opacities are noted, right greater than left, concerning for edema or possibly inflammation. No pneumothorax is noted. Nielsen left pleural effusion is noted. Bony thorax is unremarkable. IMPRESSION: Stable bilateral lung opacities are noted, right greater than left, concerning for edema or possibly inflammation. Nielsen left pleural effusion.  Aortic Atherosclerosis (ICD10-I70.0). Electronically Signed   By: Marijo Conception M.D.   On: 05/21/2019 11:30   DG Chest Port 1 View  Result Date: 05/17/2019 CLINICAL DATA:  Shortness of breath EXAM: PORTABLE CHEST 1 VIEW COMPARISON:  03/13/2019 FINDINGS: Cardiac shadow is enlarged but stable. Aortic calcifications are seen. Right chest wall port is noted. Increasing vascular congestion is noted with parenchymal edema worse on the right than the left. No bony abnormality is seen. IMPRESSION: Increasing vascular congestion and parenchymal edema. Electronically Signed   By: Inez Catalina M.D.   On: 05/17/2019 15:43   ECHOCARDIOGRAM COMPLETE  Result Date: 05/18/2019    ECHOCARDIOGRAM REPORT   Patient Name:   LAVREN LEWAN Date of Exam: 05/18/2019 Medical Rec #:  [id number]         Height:       62.0 in Accession #:    [id number]        Weight:       134.5 lb Date of Birth:  Nov 13, 1927         BSA:          1.615 m Patient Age:    47 years          BP:           154/69 mmHg Patient Gender: F                 HR:           74 bpm. Exam Location:  Inpatient Procedure: 2D Echo, Cardiac Doppler and Color Doppler Indications:    I50.23 Acute on chronic systolic (congestive) heart failure  History:        Patient has prior history of Echocardiogram examinations, most                 recent 10/19/2018. TIA; Risk Factors:Hypertension and                 Dyslipidemia. Cancer. GERD. Hypothyroidism.  Sonographer:    Jonelle Sidle Dance  Referring Phys: [id number] Krisinda Giovanni K Teylor Wolven IMPRESSIONS  1. Left ventricular ejection fraction, by estimation, is 60 to 65%. The left ventricle has normal function. The left ventricle has no regional wall motion abnormalities. Left ventricular diastolic parameters are consistent with Grade II diastolic dysfunction (pseudonormalization).  2. Right ventricular systolic function is mildly reduced. The right ventricular size is normal. There is mildly elevated pulmonary artery systolic pressure.  3. Left atrial size was moderately dilated.  4. The mitral valve is grossly normal. Mild mitral valve regurgitation. No evidence of mitral stenosis.  5. The aortic valve is tricuspid. Aortic valve regurgitation is trivial. No aortic stenosis is present. FINDINGS  Left Ventricle: Left ventricular ejection fraction, by estimation, is 60 to 65%. The left ventricle has normal function. The left ventricle has no regional wall motion abnormalities. The left ventricular internal cavity size was normal in size. There is  no left ventricular hypertrophy. Left ventricular diastolic parameters are consistent with Grade II diastolic dysfunction (pseudonormalization). Right Ventricle: The right ventricular size is normal. No increase in right ventricular wall thickness. Right ventricular systolic function is mildly reduced. There is mildly elevated pulmonary artery systolic pressure. The tricuspid regurgitant velocity  is 2.79 m/s, and with an assumed right atrial pressure of 3 mmHg, the estimated right ventricular systolic pressure is 25.4 mmHg. Left Atrium: Left atrial size was moderately dilated. Right Atrium: Right atrial size was normal in size. Pericardium: There is no evidence of pericardial effusion. Mitral Valve:  The mitral valve is grossly normal. Moderate mitral annular calcification. Mild mitral valve regurgitation. No evidence of mitral valve stenosis. Tricuspid Valve: The tricuspid valve is normal in structure. Tricuspid valve  regurgitation is mild . No evidence of tricuspid stenosis. Aortic Valve: The aortic valve is tricuspid. Aortic valve regurgitation is trivial. Aortic regurgitation PHT measures 454 msec. No aortic stenosis is present. Pulmonic Valve: The pulmonic valve was normal in structure. Pulmonic valve regurgitation is not visualized. No evidence of pulmonic stenosis. Aorta: The aortic root and ascending aorta are structurally normal, with no evidence of dilitation. IAS/Shunts: The atrial septum is grossly normal.  LEFT VENTRICLE PLAX 2D LVIDd:         4.70 cm  Diastology LVIDs:         3.20 cm  LV e' lateral:   8.05 cm/s LV PW:         1.10 cm  LV E/e' lateral: 14.4 LV IVS:        1.00 cm  LV e' medial:    4.79 cm/s LVOT diam:     2.10 cm  LV E/e' medial:  24.2 LV SV:         70 LV SV Index:   43 LVOT Area:     3.46 cm  RIGHT VENTRICLE             IVC RV Basal diam:  2.10 cm     IVC diam: 1.70 cm RV S prime:     10.70 cm/s TAPSE (M-mode): 1.7 cm LEFT ATRIUM             Index       RIGHT ATRIUM          Index LA diam:        4.30 cm 2.66 cm/m  RA Area:     8.81 cm LA Vol (A2C):   67.1 ml 41.55 ml/m RA Volume:   12.50 ml 7.74 ml/m LA Vol (A4C):   70.9 ml 43.90 ml/m LA Biplane Vol: 68.8 ml 42.60 ml/m  AORTIC VALVE LVOT Vmax:   82.50 cm/s LVOT Vmean:  53.800 cm/s LVOT VTI:    0.202 m AI PHT:      454 msec  AORTA Ao Root diam: 3.20 cm Ao Asc diam:  2.80 cm MITRAL VALVE                TRICUSPID VALVE MV Area (PHT): 3.77 cm     TR Peak grad:   31.1 mmHg MV Decel Time: 201 msec     TR Vmax:        279.00 cm/s MV E velocity: 116.00 cm/s MV A velocity: 81.90 cm/s   SHUNTS MV E/A ratio:  1.42         Systemic VTI:  0.20 m                             Systemic Diam: 2.10 cm Mertie Moores MD Electronically signed by Mertie Moores MD Signature Date/Time: 05/18/2019/11:20:46 AM    Final    VAS [place] LOWER EXTREMITY VENOUS (DVT)  Result Date: 05/21/2019  Lower Venous DVTStudy Indications: Swelling.  Risk Factors: Cancer metastatic  cholangiocarcinoma. Anticoagulation: Heparin. Limitations: Left leg birth deformity. Comparison Study: No prior exam. Performing Technologist: Baldwin Crown ARDMS, RVT  Examination Guidelines: A complete evaluation includes B-mode imaging, spectral Doppler, color Doppler, and power Doppler as needed of all accessible portions of each vessel. Bilateral testing is  considered an integral part of a complete examination. Limited examinations for reoccurring indications may be performed as noted. The reflux portion of the exam is performed with the patient in reverse Trendelenburg.  +---------+---------------+---------+-----------+----------+--------------+ RIGHT    CompressibilityPhasicitySpontaneityPropertiesThrombus Aging +---------+---------------+---------+-----------+----------+--------------+ CFV      Full           Yes      Yes                                 +---------+---------------+---------+-----------+----------+--------------+ SFJ      Full                                                        +---------+---------------+---------+-----------+----------+--------------+ FV Prox  Full                                                        +---------+---------------+---------+-----------+----------+--------------+ FV Mid   Full                                                        +---------+---------------+---------+-----------+----------+--------------+ FV DistalFull                                                        +---------+---------------+---------+-----------+----------+--------------+ PFV      Full                                                        +---------+---------------+---------+-----------+----------+--------------+ POP      Full           Yes      Yes                                 +---------+---------------+---------+-----------+----------+--------------+ PTV      Full                                                         +---------+---------------+---------+-----------+----------+--------------+ PERO     Full                                                        +---------+---------------+---------+-----------+----------+--------------+   +---------+---------------+---------+-----------+----------+--------------+ LEFT     CompressibilityPhasicitySpontaneityPropertiesThrombus Aging +---------+---------------+---------+-----------+----------+--------------+ CFV  Full           Yes      Yes                                 +---------+---------------+---------+-----------+----------+--------------+ SFJ      Full                                                        +---------+---------------+---------+-----------+----------+--------------+ FV Prox  Full                                                        +---------+---------------+---------+-----------+----------+--------------+ FV Mid   Full                                                        +---------+---------------+---------+-----------+----------+--------------+ FV DistalFull                                                        +---------+---------------+---------+-----------+----------+--------------+ PFV      Full                                                        +---------+---------------+---------+-----------+----------+--------------+ POP      Full           Yes      Yes                                 +---------+---------------+---------+-----------+----------+--------------+ PTV      Full                                                        +---------+---------------+---------+-----------+----------+--------------+ PERO     Full                                                        +---------+---------------+---------+-----------+----------+--------------+     Summary: RIGHT: - There is no evidence of deep vein thrombosis in the lower extremity.  - No cystic structure  found in the popliteal fossa.  LEFT: - There is no evidence of deep vein thrombosis in the lower extremity. However, portions of this examination were limited- see technologist comments above.  - No cystic structure found  in the popliteal fossa.  *See table(s) above for measurements and observations.    Preliminary     Lab Data:  CBC: Recent Labs  Lab 05/17/19 1529 05/17/19 1529 05/18/19 0430 05/19/19 0233 05/19/19 1540 05/20/19 0500 05/20/19 2332  WBC 13.9*  --  12.2* 10.0  --  9.0 12.8*  HGB 8.7*   < > 7.5* 7.2* 9.3* 8.8* 8.4*  HCT 28.2*   < > 24.9* 23.7* 29.2* 27.4* 26.8*  MCV 102.9*  --  102.9* 103.9*  --  96.8 96.8  PLT 371  --  304 283  --  302 313   < > = values in this interval not displayed.   Basic Metabolic Panel: Recent Labs  Lab 05/17/19 1529 05/18/19 0430 05/18/19 0810 05/19/19 0233 05/20/19 0500 05/20/19 2332  NA 137 137  --  139 138 135  K 5.5* 4.7  --  4.0 3.7 3.4*  CL 107 105  --  104 100 98  CO2 21* 21*  --  _0 GLUCOSE 139* 104*  --  91 206* 142*  BUN 42* 41*  --  43* 51* 54*  CREATININE 1.47* 1.54*  --  1.57* 1.93* 1.68*  CALCIUM 9.4 9.0  --  8.9 8.6* 8.1*  MG  --   --  2.5*  --   --   --    GFR: Estimated Creatinine Clearance: 17.6 mL/min (A) (by C-G formula based on SCr of 1.68 mg/dL (H)). Liver Function Tests: Recent Labs  Lab 05/17/19 1529  AST 28  ALT 17  ALKPHOS 65  BILITOT 1.1  PROT 7.1  ALBUMIN 3.6   No results for input(s): LIPASE, AMYLASE in the last 168 hours. No results for input(s): AMMONIA in the last 168 hours. Coagulation Profile: No results for input(s): INR, PROTIME in the last 168 hours. Cardiac Enzymes: No results for input(s): CKTOTAL, CKMB, CKMBINDEX, TROPONINI in the last 168 hours. BNP (last 3 results) No results for input(s): PROBNP in the last 8760 hours. HbA1C: No results for input(s): HGBA1C in the last 72 hours. CBG: No results for input(s): GLUCAP in the last 168 hours. Lipid Profile: No  results for input(s): CHOL, HDL, LDLCALC, TRIG, CHOLHDL, LDLDIRECT in the last 72 hours. Thyroid Function Tests: No results for input(s): TSH, T4TOTAL, FREET4, T3FREE, THYROIDAB in the last 72 hours. Anemia Panel: No results for input(s): VITAMINB12, FOLATE, FERRITIN, TIBC, IRON, RETICCTPCT in the last 72 hours. Urine analysis:    Component Value Date/Time   COLORURINE YELLOW 03/13/2019 1305   APPEARANCEUR HAZY (A) 03/13/2019 1305   LABSPEC 1.020 03/13/2019 1305   PHURINE 5.0 03/13/2019 1305   GLUCOSEU NEGATIVE 03/13/2019 1305   HGBUR MODERATE (A) 03/13/2019 1305   BILIRUBINUR NEGATIVE 03/13/2019 1305   KETONESUR NEGATIVE 03/13/2019 1305   PROTEINUR >=300 (A) 03/13/2019 1305   NITRITE NEGATIVE 03/13/2019 1305   LEUKOCYTESUR TRACE (A) 03/13/2019 1305     Keaghan Bowens M.D. [facility] 05/21/2019, 1:38 PM   Call night coverage person covering after 7pm

## 2019-05-21 NOTE — Progress Notes (Signed)
PT Cancellation Note  Patient Details Name: Summer Nielsen MRN: [id number] DOB: 11/28/1927   Cancelled Treatment:    Reason Eval/Treat Not Completed: Patient at procedure or test/unavailable Chest xray.  Will check back as schedule permits.   Haruye Lainez,KATHrine E 05/21/2019, 11:02 AM Arlyce Dice, DPT Acute Rehabilitation Services Office: [phone]

## 2019-05-21 NOTE — TOC Progression Note (Signed)
Transition of Care [facility]) - Progression Note    Patient Details  Name: Summer Nielsen MRN: [id number] Date of Birth: 1927-09-26  [facility] CM/SW Contact  Lennart Pall, LCSW Phone Number: 05/21/2019, 2:48 PM  Clinical Narrative:  Per pt and spouse request, have referred them both to [facility]/ Mobile meals program.  Have informed pt that, per Delfino Lovett with [employer], there is a 6-12 week wait list and that, at this time due to [place], they are only delivering a week supply of frozen meals per week and will resume daily/ warm meals once they are cleared to do so.     Expected Discharge Plan: [place] Barriers to Discharge: Continued Medical Work up  Expected Discharge Plan and Services Expected Discharge Plan: [place] In-house Referral: Clinical Social Work Discharge Planning Services: Mobile Meals   Living arrangements for the past 2 months: Apartment                                       Social Determinants of Health (SDOH) Interventions    Readmission Risk Interventions Readmission Risk Prevention Plan 05/21/2019 05/21/2019  Transportation Screening - Complete  PCP or Specialist Appt within 3-5 Days - Complete  HRI or [place] - Complete  Social Work Consult for [place] Planning/Counseling - Complete  Palliative Care Screening - Not Applicable  Medication Review Press photographer) Complete Referral to Pharmacy  Some recent data might be hidden

## 2019-05-21 NOTE — Evaluation (Signed)
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  Pt admitted for Acute respiratory failure ([place]) with hypoxia likely secondary to  Acute on chronic diastolic CHF (congestive heart failure) ([place]), has underlying history of pulmonary fibrosis, was being worked up for interstitial lung disease  Clinical Impression  Pt admitted with above diagnosis.  Pt currently with functional limitations due to the deficits listed below (see PT Problem List). Pt will benefit from skilled PT to increase their independence and safety with mobility to allow discharge to the venue listed below.   Pt reports limited mobility a week prior to admission.  Pt typically ambulates with SPC or RW and requires 4L O2 [place] at baseline (however states oxygen is new).  Pt assisted with ambulating short distance with RW and required min assist.  Pt also with SPO2 86% on 6L O2 [place] during ambulation.  Pt uncertain if she would be able to function at home in current condition so recommending SNF at this time.  Pt is very open to physical therapy in general stating, "I love physical therapy!" upon therapist introduction.     Follow Up Recommendations Supervision/Assistance - 24 hour;SNF    Equipment Recommendations  None recommended by PT    Recommendations for Other Services       Precautions / Restrictions Precautions Precautions: Fall Precaution Comments: 4L O2 baseline      Mobility  Bed Mobility Overal bed mobility: Needs Assistance Bed Mobility: Supine to Sit     Supine to sit: Min assist     General bed mobility comments: slight assist for trunk upright  Transfers Overall transfer level:  Needs assistance Equipment used: Rolling walker (2 wheeled) Transfers: Sit to/from Stand Sit to Stand: Min assist         General transfer comment: assist to rise and steady, verbal cues for safe technique  Ambulation/Gait Ambulation/Gait assistance: Min assist Gait Distance (Feet): 40 Feet Assistive device: Rolling walker (2 wheeled) Gait Pattern/deviations: Step-through pattern;Decreased stride length;Trunk flexed     General Gait Details: pt with L LLD (has built up shoe), short steps, fatigues quickly, reports 3/4 dyspnea and SPO2 86-88% on 6L O2 [place] (RN aware)  Stairs            Wheelchair Mobility    Modified Rankin (Stroke Patients Only)       Balance Overall balance assessment: Needs assistance         Standing balance support: Bilateral upper extremity supported Standing balance-Leahy Scale: Poor Standing balance comment: reliant on UE support today                             Pertinent Vitals/Pain Pain Assessment: No/denies pain    Home Living Family/patient expects to be discharged to:: Private residence Living Arrangements: Spouse/significant other Available Help at Discharge: Family;Available PRN/intermittently Type of Home: Apartment Home Access: Level entry     Home Layout: One level Home Equipment: Walker - 2 wheels;Cane - single point      Prior Function Level of Independence: Independent with assistive device(s)               Hand Dominance        Extremity/Trunk Assessment  Lower Extremity Assessment Lower Extremity Assessment: Generalized weakness    Cervical / Trunk Assessment Cervical / Trunk Assessment: Kyphotic  Communication   Communication: No difficulties  Cognition Arousal/Alertness: Awake/alert Behavior During Therapy: WFL for tasks assessed/performed Overall Cognitive Status: Within Functional Limits for tasks assessed                                         General Comments      Exercises     Assessment/Plan    PT Assessment Patient needs continued PT services  PT Problem List Decreased strength;Decreased mobility;Decreased activity tolerance;Decreased balance;Decreased knowledge of use of DME;Decreased knowledge of precautions;Cardiopulmonary status limiting activity       PT Treatment Interventions DME instruction;Therapeutic exercise;Gait training;Balance training;Therapeutic activities;Functional mobility training;Patient/family education    PT Goals (Current goals can be found in the Care Plan section)  Acute Rehab PT Goals PT Goal Formulation: With patient Time For Goal Achievement: 06/04/19 Potential to Achieve Goals: Good    Frequency Min 3X/week   Barriers to discharge        Co-evaluation               AM-PAC PT "6 Clicks" Mobility  Outcome Measure Help needed turning from your back to your side while in a flat bed without using bedrails?: A Little Help needed moving from lying on your back to sitting on the side of a flat bed without using bedrails?: A Little Help needed moving to and from a bed to a chair (including a wheelchair)?: A Little Help needed standing up from a chair using your arms (e.g., wheelchair or bedside chair)?: A Little Help needed to walk in hospital room?: A Little Help needed climbing 3-5 steps with a railing? : A Lot 6 Click Score: 17    End of Session Equipment Utilized During Treatment: Gait belt;Oxygen Activity Tolerance: Patient tolerated treatment well Patient left: in chair;with call bell/phone within reach;with chair alarm set Nurse Communication: Mobility status PT Visit Diagnosis: Other abnormalities of gait and mobility (R26.89)    Time: 1610-9604 PT Time Calculation (min) (ACUTE ONLY): 24 min   Charges:   PT Evaluation $PT Eval Low Complexity: 1 Low     Kati PT, DPT Acute Rehabilitation Services Office: [phone]  Trena Platt 05/21/2019, 4:29  PM

## 2019-05-21 NOTE — TOC Initial Note (Addendum)
Transition of Care [facility]) - Initial/Assessment Note    Patient Details  Name: Summer Nielsen MRN: [id number] Date of Birth: 12/15/1927  Transition of Care [facility]) CM/SW Contact:    Lennart Pall, LCSW Phone Number: 05/21/2019, 2:41 PM  Clinical Narrative:   Very pleasant, elderly woman with spouse at bedside and both very engaged.  Pt admits she is eager to return home, however, awaits results of CT and eval by PT.  Spouse very supportive and appears able to provide supervision level of care.  Local son, Elta [place], who is retired is also very supportive.  They do have a housekeeper and are interested in mobile meals if they are available - will refer.  Pt already has O2 at home and has had [facility] in the past and would like to use them again if [facility] recommended.                Expected Discharge Plan: [place] Barriers to Discharge: Continued Medical Work up   Patient Goals and CMS Choice Patient states their goals for this hospitalization and ongoing recovery are:: to be able to return home with her spouse and be as independent as possible      Expected Discharge Plan and Services Expected Discharge Plan: [place] In-house Referral: Clinical Social Work Discharge Planning Services: Mobile Meals   Living arrangements for the past 2 months: Apartment                                      Prior Living Arrangements/Services Living arrangements for the past 2 months: Apartment Lives with:: Spouse Patient language and need for interpreter reviewed:: No Do you feel safe going back to the place where you live?: Yes      Need for Family Participation in Patient Care: No (Comment) Care giver support system in place?: Yes (comment) Current home services: Housekeeping Criminal Activity/Legal Involvement Pertinent to Current Situation/Hospitalization: No - Comment as needed  Activities of Daily Living Home Assistive Devices/Equipment: Cane  (specify quad or straight), Eyeglasses, Walker (specify type), Nebulizer, Oxygen(single point cane, front wheeled walker) ADL Screening (condition at time of admission) Patient's cognitive ability adequate to safely complete daily activities?: Yes Is the patient deaf or have difficulty hearing?: No Does the patient have difficulty seeing, even when wearing glasses/contacts?: No Does the patient have difficulty concentrating, remembering, or making decisions?: No Patient able to express need for assistance with ADLs?: Yes Does the patient have difficulty dressing or bathing?: No Independently performs ADLs?: Yes (appropriate for developmental age) Does the patient have difficulty walking or climbing stairs?: Yes Weakness of Legs: Both Weakness of Arms/Hands: None  Permission Sought/Granted Permission sought to share information with : Family Supports, Chartered certified accountant granted to share information with : Yes, Verbal Permission Granted  Share Information with NAME: [name]     Permission granted to share info w Relationship: son  Permission granted to share info w Contact Information: [phone]  Emotional Assessment Appearance:: Appears stated age Attitude/Demeanor/Rapport: Engaged, Gracious Affect (typically observed): Accepting, Hopeful, Pleasant Orientation: : Oriented to Self, Oriented to Place, Oriented to  Time, Oriented to Situation   Psych Involvement: No (comment)  Admission diagnosis:  Acute respiratory failure (HCC) [J96.00] Acute respiratory failure with hypoxia (HCC) [J96.01] Acute on chronic congestive heart failure, unspecified heart failure type [facility]) [I50.9] Patient Active Problem List   Diagnosis  Date Noted  . Pressure injury of skin 05/19/2019  . Acute respiratory failure ([place]) 05/17/2019  . Acute on chronic diastolic CHF (congestive heart failure) ([place]) 05/17/2019  . Cholangiocarcinoma metastatic to liver ([place]) 03/13/2019  .  Port-A-Cath in place 12/19/2018  . Goals of care, counseling/discussion 09/01/2018  . Cholangiocarcinoma ([place]) 10/13/2017  . Liver mass 09/07/2017  . DOE (dyspnea on exertion) 08/30/2017  . Anemia due to acute blood loss 03/17/2017    Class: Acute  . Other secondary scoliosis, lumbar region 03/16/2017    Class: Chronic  . Spinal stenosis of lumbar region 03/15/2017    Class: Chronic  . Status post lumbar spinal fusion 03/15/2017  . Closed displaced fracture of base of fifth metacarpal bone of left hand 10/12/2016  . Lumbar radiculopathy 08/26/2016  . Iron deficiency anemia 05/20/2016  . Chronic right shoulder pain 02/16/2016  . Acute diastolic heart failure ([place]) 10/30/2015  . Secondary pulmonary hypertension 10/30/2015  . Hypertensive heart disease with heart failure ([place]) 10/30/2015  . TIA (transient ischemic attack)   . 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 Postsurgical hypothyroidism   . OP (osteoporosis)   . Hyperlipidemia   . High blood pressure   . Glaucoma   . CHF (congestive heart failure) ([place])   . Mucoid cyst, joint 09/24/2015  . Primary osteoarthritis of right hand 09/24/2015   PCP:  Maurice Small, MD Pharmacy:   [id number] [place] [id number]: [phone]: [phone]    Social Determinants of Health (SDOH) Interventions    Readmission Risk Interventions Readmission Risk Prevention Plan 05/21/2019 05/21/2019  Transportation Screening - Complete  PCP or Specialist Appt within 3-5 Days - Complete  HRI or [place]- Complete  Social Work Consult for [place]/Counseling - Complete  Palliative Care Screening - Not Applicable  Medication Review (Press photographer Complete Referral to Pharmacy  Some recent data might be hidden

## 2019-05-21 NOTE — Care Management Important Message (Signed)
Important Message  Patient Details IM Letter given to Gabriel Earing RN Case Manager to present to the Patient Name: Summer Nielsen MRN: [id number] Date of Birth: May 21, 1927   Medicare Important Message Given:  Yes     Kerin Salen 05/21/2019, 11:51 AM

## 2019-05-21 NOTE — Progress Notes (Signed)
Progress Note  Patient Name: Summer Nielsen Date of Encounter: 05/21/2019  Primary Cardiologist: Candee Furbish, MD   Subjective   Mild dyspnea; no CP; confused  Inpatient Medications    Scheduled Meds: . sodium chloride   Intravenous Once  . amLODipine  5 mg Oral Daily  . atorvastatin  20 mg Oral Daily  . benzonatate  100 mg Oral TID  . calcium-vitamin D  1 tablet Oral Daily  . chlorhexidine  15 mL Mouth Rinse BID  . Chlorhexidine Gluconate Cloth  6 each Topical Daily  . dicyclomine  20 mg Oral TID  . fluticasone furoate-vilanterol  1 puff Inhalation Daily  . furosemide  40 mg Oral Daily  . gabapentin  300 mg Oral BID  . heparin  5,000 Units Subcutaneous Q8H  . ipratropium-albuterol  3 mL Nebulization BID  . latanoprost  1 drop Both Eyes QHS  . levothyroxine  150 mcg Oral QAC breakfast  . mouth rinse  15 mL Mouth Rinse q12n4p  . methylPREDNISolone (SOLU-MEDROL) injection  40 mg Intravenous Q12H  . pantoprazole  40 mg Oral Daily  . propranolol  20 mg Oral BID  . sodium chloride flush  10-40 mL Intracatheter Q12H  . sodium chloride flush  3 mL Intravenous Q12H   Continuous Infusions: . sodium chloride     PRN Meds: sodium chloride, acetaminophen, albuterol, guaiFENesin-dextromethorphan, hydrALAZINE, loperamide, ondansetron (ZOFRAN) IV, sodium chloride flush, sodium chloride flush   Vital Signs    Vitals:   05/20/19 1351 05/20/19 2013 05/20/19 2107 05/21/19 0459  BP: (!) 144/60  (!) 141/58 (!) 162/68  Pulse: 84  74 72  Resp: (!) 22  18   Temp: 97.7 F (36.5 C)  97.9 F (36.6 C) 97.8 F (36.6 C)  TempSrc: Tympanic  Oral Oral  SpO2: 98% 96% 93%   Weight:    59.3 kg  Height:        Intake/Output Summary (Last 24 hours) at 05/21/2019 0810 Last data filed at 05/21/2019 0501 Gross per 24 hour  Intake 130 ml  Output 1700 ml  Net -1570 ml   Last 3 Weights 05/21/2019 05/20/2019 05/19/2019  Weight (lbs) 130 lb 11.2 oz 131 lb 9.8 oz 134 lb 11.2 oz  Weight (kg)  59.285 kg 59.7 kg 61.1 kg      Telemetry    Sinus with PVCs and NSVT- Personally Reviewed   Physical Exam   GEN: No acute distress.   Neck: No JVD Cardiac: RRR Respiratory: Basilar dry crackles GI: Soft, nontender, non-distended  MS: No edema Neuro:  Nonfocal  Psych: Normal affect   Labs    High Sensitivity Troponin:   Recent Labs  Lab 05/17/19 1529 05/17/19 1806  TROPONINIHS 6 6      Chemistry Recent Labs  Lab 05/17/19 1529 05/18/19 0430 05/19/19 0233 05/20/19 0500 05/20/19 2332  NA 137   < > 139 138 135  K 5.5*   < > 4.0 3.7 3.4*  CL 107   < > 104 100 98  CO2 21*   < > _0 GLUCOSE 139*   < > 91 206* 142*  BUN 42*   < > 43* 51* 54*  CREATININE 1.47*   < > 1.57* 1.93* 1.68*  CALCIUM 9.4   < > 8.9 8.6* 8.1*  PROT 7.1  --   --   --   --   ALBUMIN 3.6  --   --   --   --   AST  28  --   --   --   --   ALT 17  --   --   --   --   ALKPHOS 65  --   --   --   --   BILITOT 1.1  --   --   --   --   GFRNONAA 31*   < > 29* 22* 26*  GFRAA 36*   < > 33* 26* 31*  ANIONGAP 9   < > _0 < > = values in this interval not displayed.     Hematology Recent Labs  Lab 05/19/19 0233 05/19/19 0233 05/19/19 1540 05/20/19 0500 05/20/19 2332  WBC 10.0  --   --  9.0 12.8*  RBC 2.28*  --   --  2.83* 2.77*  HGB 7.2*   < > 9.3* 8.8* 8.4*  HCT 23.7*   < > 29.2* 27.4* 26.8*  MCV 103.9*  --   --  96.8 96.8  MCH 31.6  --   --  31.1 30.3  MCHC 30.4  --   --  32.1 31.3  RDW 18.2*  --   --  19.0* 18.2*  PLT 283  --   --  302 313   < > = values in this interval not displayed.    BNP Recent Labs  Lab 05/17/19 1529  BNP 957.7*     Patient Profile     86 y.o. female with past medical history of metastatic cholangiocarcinoma, pulmonary fibrosis being seen for acute on chronic diastolic congestive heart failure.  Echocardiogram shows normal LV function, grade 2 diastolic dysfunction, moderate left atrial enlargement and mild mitral regurgitation.  Assessment &  Plan    1 acute on chronic diastolic congestive heart failure-she does not appear to be volume overloaded today and renal function worse since admission.  We will continue with lower dose oral Lasix and follow.  2 acute respiratory failure-likely significant component of interstitial lung disease.  Management of pulmonary fibrosis per primary service.  3 metastatic cholangiocarcinoma-Per oncology.  4 nonsustained ventricular tachycardia-noted on telemetry.  Continue beta-blocker but given pulmonary disease will change propranolol to metoprolol 25 mg twice daily.  LV function normal.  Given patient's age and multiple medical problems it would be reasonable to have palliative care involved.  For questions or updates, please contact [place] Please consult [email or web address] for contact info under        Signed, Kirk Ruths, MD  05/21/2019, 8:10 AM

## 2019-05-21 NOTE — Plan of Care (Signed)

## 2019-05-21 NOTE — Progress Notes (Signed)
Bilateral lower extremity venous duplex exam completed.  Preliminary results can be found under CV proc under chart review.  05/21/2019 1:08 PM  Eliseo Withers, K., RDMS, RVT

## 2019-05-21 NOTE — Consult Note (Addendum)
NAME:  Summer Nielsen, MRN:  [id number], DOB:  [birth year], LOS: 4 ADMISSION DATE:  05/17/2019, CONSULTATION DATE:  05/21/2019 REFERRING MD:  Dr. Tana Coast, CHIEF COMPLAINT:  Dypsnea   Brief History   86 year old female admitted for presumed volume overload and pulmonary edema. She did not improve as expected with diuresis and PCCM was consulted for further evaluation.   History of present illness   86 year old female with PMH as below, which is significant for metastatic cholangiocarcinoma completed cycle of gemcitabine/Abraxane on 2/3, CHF, and has recently been undergoing workup for pulmonary fibrosis with Dr. Vaughan Browner on has been on 2L [place] at home. He felt as though imaging was most consistent with hypersensitivity, but no clear exposures. She was referred for CTD serologies, PFT, and Breo. In collaboration with oncology, she was asked to hold losartan and lasix pending her CT, and unfortunately she developed respiratory distress in the days leading up to her presentation of 3/11.  Her oxygen saturations were in the 70s on room air and she was placed supplemental O2 and admitted to the hospitalists service with the presumptive diagnosis of pulmonary edema/volume overload in the setting of marked lower extremity edema and having held her lasix for several days. CXR consistent with this. She was treated with IV diuresis for several days. Symptoms somewhat improved, however, she remained on 6L [place], up from her previous 2L. She began to have renal insufficiency, and diuresis was stopped 3/15. PCCM asked to see for further evaluation.   Past Medical History   has a past medical history of Cancer ([place]), CHF (congestive heart failure) ([place]), Cholangiocarcinoma ([place]) (dx'd 09/3426), Complication of anesthesia, GERD (gastroesophageal reflux disease), Glaucoma, High blood pressure, Hyperlipidemia, Hypothyroidism, OP (osteoporosis), PONV (postoperative nausea and vomiting), Postsurgical hypothyroidism, Pulmonary fibrosis  ([place]), Spondylosis of cervical region without myelopathy or radiculopathy, Tachycardia, and TIA (transient ischemic attack).   Significant Hospital Events   3/11 admit  Consults:  Cardiology Pulm  Procedures:    Significant Diagnostic Tests:    Micro Data:  COVID 3/15 neg  Antimicrobials:    Interim history/subjective:  Remains short of breath and decompensates quickly with any exertion. Complains of productive cough not improving.   Objective   Blood pressure (!) 150/67, pulse 73, temperature 97.7 F (36.5 C), temperature source Oral, resp. rate 20, height _0  (1.575 m), weight 59.3 kg, SpO2 91 %.    FiO2 (%):  [44 %] 44 %   Intake/Output Summary (Last 24 hours) at 05/21/2019 1059 Last data filed at 05/21/2019 0501 Gross per 24 hour  Intake 120 ml  Output 1700 ml  Net -1580 ml   Filed Weights   05/19/19 0600 05/20/19 0654 05/21/19 0459  Weight: 61.1 kg 59.7 kg 59.3 kg    Examination: General: frail elderly female in mild respiratory distress HENT: [place]/AT, PERRL, no JVD Lungs: Rales throughout. Moderate distress with conversation.  Cardiovascular: Tachy, regular, no MRG Abdomen: Soft, non-tender, non-distended Extremities: No acute deformity or ROM limitation.  Neuro: Alert, oriented, non-focal  Resolved Hospital Problem list     Assessment & Plan:   Acute hypoxemic respiratory failure requiring 6L [place] (up from recent baseline of 2L). Upon admission was markedly volume overloaded. She has been diuresed and feels a lot better, but remains very dyspneic. Need to rule out infectious etiology as well as venous thromboembolic disease. If this is rapid progression of ILD, there will be few options. She was started on IV steroids 3/14. ANA positive, CCP 54. Rheumatoid  factor negative.  - Would benefit from CT chest. Hopefully renal function will allow for [place] to get a CTA chest in the next 24-48 hours, if not, we should pursue non-contrast CT chest.  - Doppler lower  extremities. - V/Q scan will likely be of little guidance. - Consider empiric antibiotics - Sputum culture - Respiratory viral panel - Continue Breo, duoneb  Metastatic cholangiocarcinoma: finished course of gemcitabine/Abraxane 2/3. Followed by Dr. Benay Spice. - Management per oncology. - Further therapy pending repeat imaging.   Best practice:  Diet: per primary Pain/Anxiety/Delirium protocol (if indicated): NA VAP protocol (if indicated): NA DVT prophylaxis: per primary GI prophylaxis: per primary Glucose control: SSI Mobility: per primary Code Status: FULL CODE Family Communication: Patient updated at bedside  Labs   CBC: Recent Labs  Lab 05/17/19 1529 05/17/19 1529 05/18/19 0430 05/19/19 0233 05/19/19 1540 05/20/19 0500 05/20/19 2332  WBC 13.9*  --  12.2* 10.0  --  9.0 12.8*  HGB 8.7*   < > 7.5* 7.2* 9.3* 8.8* 8.4*  HCT 28.2*   < > 24.9* 23.7* 29.2* 27.4* 26.8*  MCV 102.9*  --  102.9* 103.9*  --  96.8 96.8  PLT 371  --  304 283  --  302 313   < > = values in this interval not displayed.    Basic Metabolic Panel: Recent Labs  Lab 05/17/19 1529 05/18/19 0430 05/18/19 0810 05/19/19 0233 05/20/19 0500 05/20/19 2332  NA 137 137  --  139 138 135  K 5.5* 4.7  --  4.0 3.7 3.4*  CL 107 105  --  104 100 98  CO2 21* 21*  --  _0 GLUCOSE 139* 104*  --  91 206* 142*  BUN 42* 41*  --  43* 51* 54*  CREATININE 1.47* 1.54*  --  1.57* 1.93* 1.68*  CALCIUM 9.4 9.0  --  8.9 8.6* 8.1*  MG  --   --  2.5*  --   --   --    GFR: Estimated Creatinine Clearance: 17.6 mL/min (A) (by C-G formula based on SCr of 1.68 mg/dL (H)). Recent Labs  Lab 05/17/19 1529 05/17/19 1534 05/18/19 0430 05/19/19 0233 05/20/19 0500 05/20/19 2332  WBC   < >  --  12.2* 10.0 9.0 12.8*  LATICACIDVEN  --  0.7  --   --   --   --    < > = values in this interval not displayed.    Liver Function Tests: Recent Labs  Lab 05/17/19 1529  AST 28  ALT 17  ALKPHOS 65  BILITOT 1.1  PROT  7.1  ALBUMIN 3.6   No results for input(s): LIPASE, AMYLASE in the last 168 hours. No results for input(s): AMMONIA in the last 168 hours.  ABG No results found for: PHART, PCO2ART, PO2ART, HCO3, TCO2, ACIDBASEDEF, O2SAT   Coagulation Profile: No results for input(s): INR, PROTIME in the last 168 hours.  Cardiac Enzymes: No results for input(s): CKTOTAL, CKMB, CKMBINDEX, TROPONINI in the last 168 hours.  HbA1C: No results found for: HGBA1C  CBG: No results for input(s): GLUCAP in the last 168 hours.  Review of Systems:    Bolds are positive  Constitutional: weight loss, gain, night sweats, Fevers, chills, fatigue .  HEENT: headaches, Sore throat, sneezing, nasal congestion, post nasal drip, Difficulty swallowing, Tooth/dental problems, visual complaints visual changes, ear ache CV:  chest pain, radiates:,Orthopnea, PND, swelling in lower extremities, dizziness, palpitations, syncope.  GI  heartburn, indigestion, abdominal  pain, nausea, vomiting, diarrhea, change in bowel habits, loss of appetite, bloody stools.  Resp: cough, productive:yellow mucous , hemoptysis, dyspnea, chest pain, pleuritic.  Skin: rash or itching or icterus GU: dysuria, change in color of urine, urgency or frequency. flank pain, hematuria  MS: joint pain or swelling. decreased range of motion  Psych: change in mood or affect. depression or anxiety.  Neuro: difficulty with speech, weakness, numbness, ataxia    Past Medical History  She,  has a past medical history of Cancer ([place]), CHF (congestive heart failure) ([place]), Cholangiocarcinoma ([place]) (dx'd 06/979), Complication of anesthesia, GERD (gastroesophageal reflux disease), Glaucoma, High blood pressure, Hyperlipidemia, Hypothyroidism, OP (osteoporosis), PONV (postoperative nausea and vomiting), Postsurgical hypothyroidism, Pulmonary fibrosis ([place]), Spondylosis of cervical region without myelopathy or radiculopathy, Tachycardia, and TIA (transient ischemic  attack).   Surgical History    Past Surgical History:  Procedure Laterality Date   ABDOMINAL HYSTERECTOMY     BACK SURGERY N/A 03/2017   Lumbar   CHOLECYSTECTOMY     EYE SURGERY     removed cateracts on both eyes   IR IMAGING GUIDED PORT INSERTION  09/06/2018   JOINT REPLACEMENT     3 hip replacement on left, right knee replacement   THYROIDECTOMY       Social History   reports that she has never smoked. She has never used smokeless tobacco. She reports that she does not drink alcohol or use drugs.   Family History   Her family history includes Cerebral aneurysm in her mother; Heart attack in her father and mother; Heart disease in her brother and sister; Hypertension in her mother; Stroke in her mother.   Allergies Allergies  Allergen Reactions   Levofloxacin Nausea Only    Dizzy, hot, foot pain, insomnia   Percocet [Oxycodone-Acetaminophen] Nausea And Vomiting and Other (See Comments)    sick     Home Medications  Prior to Admission medications   Medication Sig Start Date End Date Taking? Authorizing Provider  acetaminophen (TYLENOL) 500 MG tablet Take 1,000 mg by mouth daily as needed for moderate pain or headache.   Yes [provider]  amLODipine (NORVASC) 5 MG tablet Take 5 mg by mouth daily.   Yes [provider]  aspirin 81 MG chewable tablet Chew 81 mg by mouth daily.   Yes [provider]  atorvastatin (LIPITOR) 20 MG tablet Take 20 mg by mouth daily. 03/05/19  Yes [provider]  bimatoprost (LUMIGAN) 0.01 % SOLN Place 1 drop into both eyes at bedtime.    Yes [provider]  calcium-vitamin D (OSCAL WITH D) 500-200 MG-UNIT tablet Take 1 tablet by mouth daily.    Yes [provider]  fluticasone furoate-vilanterol (BREO ELLIPTA) 100-25 MCG/INH AEPB Inhale 1 puff into the lungs daily. 05/02/19  Yes Mannam, Praveen, MD  furosemide (LASIX) 40 MG tablet Take 40-60 mg by mouth See admin instructions. Take 60  mg in the AM and 40 mg after lunch   Yes [provider]  gabapentin (NEURONTIN) 300 MG capsule TAKE 1 CAPSULE BY MOUTH THREE TIMES A DAY Patient taking differently: Take 300 mg by mouth 3 (three) times daily.  12/12/17  Yes Magnus Sinning, MD  ibuprofen (ADVIL,MOTRIN) 200 MG tablet Take 200 mg by mouth every 6 (six) hours as needed for moderate pain (back surgery).   Yes [provider]  Iron-FA-B Cmp-C-Biot-Probiotic (FUSION PLUS) CAPS Take 1 capsule by mouth daily. 04/14/17  Yes Jessy Oto, MD  levothyroxine (SYNTHROID, LEVOTHROID)  150 MCG tablet Take 150 mcg by mouth daily. 06/01/18  Yes [provider]  loperamide (IMODIUM) 2 MG capsule Take 2 capsules by mouth 4 (four) times daily as needed.   Yes [provider]  losartan (COZAAR) 50 MG tablet Take 50 mg by mouth daily.   Yes [provider]  omeprazole (PRILOSEC) 40 MG capsule Take 40 mg by mouth daily before breakfast.  10/04/18  Yes [provider]  propranolol (INDERAL) 20 MG tablet Take 20 mg by mouth 2 (two) times daily.    Yes [provider]  albuterol (VENTOLIN HFA) 108 (90 Base) MCG/ACT inhaler Inhale 2 puffs into the lungs every 6 (six) hours as needed for wheezing or shortness of breath. Patient not taking: Reported on 05/17/2019 05/16/19   Martyn Ehrich, NP  dicyclomine (BENTYL) 20 MG tablet Take 20 mg by mouth 3 (three) times daily. 04/12/19   [provider]  HYDROmorphone (DILAUDID) 2 MG tablet Take 0.5-1 tablets (1-2 mg total) by mouth every 4 (four) hours as needed for severe pain. Patient not taking: Reported on 05/17/2019 12/08/18   Ladell Pier, MD  KLOR-CON M10 10 MEQ tablet TAKE 2 TABLETS EVERY DAY Patient not taking: No sig reported 03/05/19   Ladell Pier, MD  lidocaine-prilocaine (EMLA) cream Apply 1 application topically as directed. Patient not taking: Reported on 05/17/2019 09/01/18   Ladell Pier, MD  predniSONE (DELTASONE) 10 MG  tablet Take 2 tabs x 5 days 05/17/19   Martyn Ehrich, NP      Georgann Housekeeper, AGACNP-BC [name] for personal pager PCCM on call pager [phone]  05/21/2019 1:23 PM     PCCM:  86 year old female history of metastatic cholangiocarcinoma, ILD developed volume overload and pulmonary edema.  She had to stop her medications and plans for a CT outpatient.  She has been diuresed and now doing much better.  Also has a history of pulmonary fibrosis followed by Dr. Kimber Relic.  BP 136/89 (BP Location: Left Arm)    Pulse 70    Temp 98.9 F (37.2 C) (Oral)    Resp 19    Ht 5' 2" (1.575 m)    Wt 59.3 kg    LMP  (LMP Unknown)    SpO2 98%    BMI 23.91 kg/m   General: Elderly female resting in chair no distress. Neck: No JVD Heart: Regular rate rhythm S1-S2 Lungs: Bilateral expiratory crackles  Labs: Reviewed CT imaging chest: Reviewed, previous images with significant evidence of ILD parenchymal changes.,  Chest x-ray with vascular congestion, right-sided worse than left  Assessment: Acute hypoxemic respiratory failure Acute exacerbation of ILD Pulmonary edema, Pulmonary hypertension secondary to lung disease Metastatic glandular carcinoma  Plan: Wean FiO2 as tolerated to maintain O2 sats greater than 88% Lower extremity Dopplers, if negative would hold off on anticoagulation CTA would be helpful but did not want to injure kidneys VQ scan would likely be not too helpful in the setting of underlying lung disease however we could do it if we had to. We will order noncontrasted high-resolution CT scan of the chest.  We appreciate the consultation.  Please not hesitate to call with any questions or concerns.  [facility] 05/21/2019 7:11 PM

## 2019-05-22 ENCOUNTER — Inpatient Hospital Stay: Payer: Medicare PPO

## 2019-05-22 ENCOUNTER — Inpatient Hospital Stay: Payer: Medicare PPO | Admitting: Nurse Practitioner

## 2019-05-22 ENCOUNTER — Other Ambulatory Visit: Payer: Medicare PPO

## 2019-05-22 DIAGNOSIS — N179 Acute kidney failure, unspecified: Secondary | ICD-10-CM

## 2019-05-22 DIAGNOSIS — J9601 Acute respiratory failure with hypoxia: Secondary | ICD-10-CM

## 2019-05-22 DIAGNOSIS — J9621 Acute and chronic respiratory failure with hypoxia: Secondary | ICD-10-CM

## 2019-05-22 LAB — BASIC METABOLIC PANEL
Anion gap: 10 (ref 5–15)
BUN: 59 mg/dL — ABNORMAL HIGH (ref 8–23)
CO2: 25 mmol/L (ref 22–32)
Calcium: 8.2 mg/dL — ABNORMAL LOW (ref 8.9–10.3)
Chloride: 105 mmol/L (ref 98–111)
Creatinine, Ser: 1.64 mg/dL — ABNORMAL HIGH (ref 0.44–1.00)
GFR calc Af Amer: 32 mL/min — ABNORMAL LOW (ref >60)
GFR calc non Af Amer: 27 mL/min — ABNORMAL LOW (ref >60)
Glucose, Bld: 136 mg/dL — ABNORMAL HIGH (ref 70–99)
Potassium: 4.2 mmol/L (ref 3.5–5.1)
Sodium: 140 mmol/L (ref 135–145)

## 2019-05-22 LAB — CBC
HCT: 27.8 % — ABNORMAL LOW (ref 36.0–46.0)
Hemoglobin: 8.8 g/dL — ABNORMAL LOW (ref 12.0–15.0)
MCH: 31.3 pg (ref 26.0–34.0)
MCHC: 31.7 g/dL (ref 30.0–36.0)
MCV: 98.9 fL (ref 80.0–100.0)
Platelets: 308 10*3/uL (ref 150–400)
RBC: 2.81 MIL/uL — ABNORMAL LOW (ref 3.87–5.11)
RDW: 17.8 % — ABNORMAL HIGH (ref 11.5–15.5)
WBC: 9.8 10*3/uL (ref 4.0–10.5)
nRBC: 0 % (ref 0.0–0.2)

## 2019-05-22 MED ORDER — ALBUMIN HUMAN 25 % IV SOLN: 50.0000 g | INTRAVENOUS | Status: DC

## 2019-05-22 MED ORDER — METOPROLOL TARTRATE 50 MG PO TABS
50.0000 mg | ORAL_TABLET | Freq: Two times a day (BID) | ORAL | Status: DC
  Administered 2019-05-22 – 2019-05-24 (×5): 50 mg via ORAL
  Filled 2019-05-22 (×5): qty 1

## 2019-05-22 NOTE — Progress Notes (Signed)
Progress Note  Patient Name: Summer Nielsen Date of Encounter: 05/22/2019  Primary Cardiologist: Candee Furbish, MD   Subjective   Remains dyspneic; no CP  Inpatient Medications    Scheduled Meds: . sodium chloride   Intravenous Once  . amLODipine  5 mg Oral Daily  . atorvastatin  20 mg Oral Daily  . benzonatate  100 mg Oral TID  . calcium-vitamin D  1 tablet Oral Daily  . chlorhexidine  15 mL Mouth Rinse BID  . Chlorhexidine Gluconate Cloth  6 each Topical Daily  . dicyclomine  20 mg Oral TID  . fluticasone furoate-vilanterol  1 puff Inhalation Daily  . furosemide  40 mg Oral Daily  . gabapentin  300 mg Oral BID  . heparin  5,000 Units Subcutaneous Q8H  . ipratropium-albuterol  3 mL Nebulization BID  . latanoprost  1 drop Both Eyes QHS  . levothyroxine  150 mcg Oral QAC breakfast  . mouth rinse  15 mL Mouth Rinse q12n4p  . methylPREDNISolone (SOLU-MEDROL) injection  40 mg Intravenous Q12H  . metoprolol tartrate  25 mg Oral BID  . pantoprazole  40 mg Oral Daily  . sodium chloride flush  10-40 mL Intracatheter Q12H  . sodium chloride flush  3 mL Intravenous Q12H   Continuous Infusions: . sodium chloride     PRN Meds: sodium chloride, acetaminophen, albuterol, guaiFENesin-dextromethorphan, hydrALAZINE, loperamide, ondansetron (ZOFRAN) IV, sodium chloride flush, sodium chloride flush   Vital Signs    Vitals:   05/21/19 1814 05/21/19 2001 05/21/19 2021 05/22/19 0659  BP: 136/89  (!) 127/95 (!) 153/73  Pulse: 70  66 66  Resp:   20 (!) 22  Temp: 98.9 F (37.2 C)  97.9 F (36.6 C) 98.4 F (36.9 C)  TempSrc: Oral  Oral Oral  SpO2: 98% 98% 97% 90%  Weight:      Height:        Intake/Output Summary (Last 24 hours) at 05/22/2019 0833 Last data filed at 05/22/2019 0655 Gross per 24 hour  Intake -  Output 900 ml  Net -900 ml   Last 3 Weights 05/21/2019 05/20/2019 05/19/2019  Weight (lbs) 130 lb 11.2 oz 131 lb 9.8 oz 134 lb 11.2 oz  Weight (kg) 59.285 kg 59.7 kg  61.1 kg      Telemetry    Sinus with PACs, PVCs and 13 beats NSVT- Personally Reviewed   Physical Exam   GEN: NAD Neck: supple Cardiac: RRR Respiratory: Basilar dry crackles; no wheeze GI: Soft, NT/ND MS: No edema Neuro:  Grossly intact Psych: Normal affect   Labs    High Sensitivity Troponin:   Recent Labs  Lab 05/17/19 1529 05/17/19 1806  TROPONINIHS 6 6      Chemistry Recent Labs  Lab 05/17/19 1529 05/18/19 0430 05/20/19 0500 05/20/19 2332 05/22/19 0407  NA 137   < > 138 135 140  K 5.5*   < > 3.7 3.4* 4.2  CL 107   < > 100 98 105  CO2 21*   < > _0 GLUCOSE 139*   < > 206* 142* 136*  BUN 42*   < > 51* 54* 59*  CREATININE 1.47*   < > 1.93* 1.68* 1.64*  CALCIUM 9.4   < > 8.6* 8.1* 8.2*  PROT 7.1  --   --   --   --   ALBUMIN 3.6  --   --   --   --   AST 28  --   --   --   --  ALT 17  --   --   --   --   ALKPHOS 65  --   --   --   --   BILITOT 1.1  --   --   --   --   GFRNONAA 31*   < > 22* 26* 27*  GFRAA 36*   < > 26* 31* 32*  ANIONGAP 9   < > _0 < > = values in this interval not displayed.     Hematology Recent Labs  Lab 05/20/19 0500 05/20/19 2332 05/22/19 0407  WBC 9.0 12.8* 9.8  RBC 2.83* 2.77* 2.81*  HGB 8.8* 8.4* 8.8*  HCT 27.4* 26.8* 27.8*  MCV 96.8 96.8 98.9  MCH 31.1 30.3 31.3  MCHC 32.1 31.3 31.7  RDW 19.0* 18.2* 17.8*  PLT 302 313 308    BNP Recent Labs  Lab 05/17/19 1529  BNP 957.7*     Patient Profile     86 y.o. female with past medical history of metastatic cholangiocarcinoma, pulmonary fibrosis being seen for acute on chronic diastolic congestive heart failure.  Echocardiogram shows normal LV function, grade 2 diastolic dysfunction, moderate left atrial enlargement and mild mitral regurgitation.  Assessment & Plan    1 acute on chronic diastolic congestive heart failure-patient does not appear to be volume overloaded on examination today.  Continue Lasix at present dose.  Given interstitial lung  disease we will try and run on the dry side.  2 acute respiratory failure-likely significant component of interstitial lung disease.  Management of pulmonary fibrosis per primary service.  3 metastatic cholangiocarcinoma-Per oncology.  4 nonsustained ventricular tachycardia-LV function normal.  Increase metoprolol to 50 mg twice daily.  Given patient's age and multiple medical problems it would be reasonable to have palliative care involved.  For questions or updates, please contact [place] Please consult [email or web address] for contact info under        Signed, Kirk Ruths, MD  05/22/2019, 8:33 AM

## 2019-05-22 NOTE — Progress Notes (Addendum)
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 Patient reported that she was taken off of Lasix and losartan by pulmonology and oncology for contrasted CT scan imagings. Patient has not had her Lasix for 1 week.  Patient presented with shortness of breath with minimal exertion, swelling in her lower extremities, orthopnea, hypoxia, O2 sats in 70s at home.  At baseline on 2 L O2 at home, initially placed on 5 L O2 in ED.  Received Lasix 40 mg IV x2 yesterday, overnight became more dyspneic and was placed on BiPAP. COVID-19 test negative.    HPI/Recap of past 24 hours:  Remains on 5 L oxygen, complaining of dyspnea at rest, continue to have productive cough with blood tinged sputum, she has no fever, no leukocytosis, hemoglobin stable RN report patient desatted with minimal exertion 900 cc urine output documented last 24 hours, is -5.9 L since admission per I's and O's Pressure 153/73, had one episode of nonsustained V. tach last night, denies chest pain, lower extremity edema has resolved BUN slightly trending up, creatinine appears stable  Assessment/Plan: Principal Problem:   Acute respiratory failure (HCC) Active Problems:   TIA (transient ischemic attack)   Postsurgical hypothyroidism   Hyperlipidemia   Hypertensive heart disease with heart failure (HCC)   Cholangiocarcinoma metastatic to liver (HCC)   Acute on chronic diastolic CHF (congestive heart failure) (HCC)   Pressure injury of skin  Acute respiratory failure (HCC)with hypoxia likely secondary toAcute on chronic diastolic CHF (congestive heart failure) ([place]), has underlying history of pulmonary fibrosis, was being  worked up for interstitial lung disease -Presents with elevated BNP,+JVD, orthopnea,, +lower extremity edema, cxr showed pulmonary edema.  -Patient report home Lasix dose was increased to twice a day 3 weeks ago with no improvement of symptoms -Cardiology consulted. 2D echo showed EF of 60 to 98%, grade 2 diastolic dysfunction -Initially placed on BiPAP due to respiratory distress and hypoxia, subsequently transitioned to 8 L high flow [place], per patient on 4 L O2 at home at baseline.  Currently on 5 L O2, has plateaued, difficult weaning, complaining of dyspnea at rest -On IV Solu-Medrol 40 mg every 12 hours  -Continue flutter valve, incentive spirometry, scheduled nebs -Patient was on IV Lasix 80 mg every 12 hours,has transitioned to oral Lasix.  Negative balance of 5.9 L -Weight down from 134.7-> 130  on March 15,  -Difficult weaning, still on 5 L O2, discussed with pulmonology, Dr. Valeta Harms, recommended rule out DVT/PE -Venous Dopplers negative for DVT, will follow pulmonology recommendations regarding CTA chest.  VQ scan not trending down option due to underlying lung disease. -Repeat chest x-ray today showed bilateral lung opacities right greater than left concerning for edema or possible inflammation, small left pleural effusion  AKI on CKD 3 -Creatinine peaked at 1.93 on March 14, trending down towards baseline around 1.57 -Slight worsening azotemia, suspect steroid contribute, monitor -Renal dosing meds   Active Problems: history of TIA (transient ischemic attack) -Continue statin -Aspirin currently on hold, FOBT positive, also have blood tinged sputum  History of pulmonary fibrosis -As #1, pulmonology consulted   Hypertensive heart disease with heart failure ([place]), NSVT -Cardiology following, changed propranolol to metoprolol 25 mg twice daily , dose increased to 50 mg twice a  day March 16 -Continue Norvasc, Lasix  -Continue hydralazine IV as needed with  parameters  Anemia, macrocytic: Likely anemia of chronic disease, malignancy -Hemoglobin 7.2 on 3/13, anemia panel showed saturation ratio 9, iron 28, B12 416, folate 70.9.  Transfused 1 unit packed RBC on 3/13 -FOBT positive, continue to hold aspirin.  GI was consulted -Per GI, no EGD due to multiple medical comorbidities, acute CHF, respiratory failure -H&H currently stable 8.8  Cholangiocarcinoma metastatic to liver [facility]), history of pulmonary fibrosis -Completed chemotherapy on 04/11/2019, was scheduled to have high-resolution chest CT outpatient for staging -follow-up with Dr. Benay Spice, she has a office visit scheduled on 05/22/2019  Hyperlipidemia -Continue statin   Hyperkalemia -Resolved potassium 3.7   Hyperglycemia -CBGs improving with tapering of steroids  Postsurgical hypothyroidism -TSH 4.6 Continue Synthroid  History of C. difficile in 2020 -Patient states she has off-and-on diarrhea, no increase, no abdominal pain, fever chills  DVT Prophylaxis: SCDs  Code Status: Full  Family Communication: son Elta [place] over the phone  Disposition Plan:    Patient came from:         Home with husband, son lives close by                                                                                                  Anticipated d/c place:  To be determined  Barriers to d/c OR conditions which need to be met to effect a safe d/c:  Remains significant dyspneic at rest, needs pulmonary , cardiology and oncology clearance, needs palliative care consult   Consultants:  GI, signed off  Oncology  Pulmonology  Cardiology  Palliative care  Procedures:  BiPAP briefly  Antibiotics:  None   Objective: BP (!) 153/73 (BP Location: Left Arm)   Pulse 66   Temp 98.4 F (36.9 C) (Oral)   Resp (!) 22   Ht _0  (1.575 m)   Wt 59.3 kg   LMP  (LMP Unknown)   SpO2 90%   BMI 23.91 kg/m   Intake/Output Summary (Last 24 hours) at 05/22/2019 0705 Last data filed  at 05/22/2019 0655 Gross per 24 hour  Intake --  Output 900 ml  Net -900 ml   Filed Weights   05/19/19 0600 05/20/19 0654 05/21/19 0459  Weight: 61.1 kg 59.7 kg 59.3 kg    Exam: Patient is examined daily including today on 05/22/2019, exams remain the same as of yesterday except that has changed    General: Dyspneic at rest  Cardiovascular: RRR  Respiratory: Diminished at bases, bibasilar crackles left worse than right  Abdomen: Soft/ND/NT, positive BS  Musculoskeletal: No Edema  Neuro: alert, oriented x3  Data Reviewed: Basic Metabolic Panel: Recent Labs  Lab 05/18/19 0430 05/18/19 0810 05/19/19 0233 05/20/19 0500 05/20/19 2332 05/22/19 0407  NA 137  --  139 138 135 140  K 4.7  --  4.0 3.7 3.4* 4.2  CL 105  --  104 100 98 105  CO2 21*  --  _1 GLUCOSE 104*  --  91 206* 142* 136*  BUN 41*  --  43* 51* 54* 59*  CREATININE 1.54*  --  1.57* 1.93* 1.68* 1.64*  CALCIUM 9.0  --  8.9 8.6* 8.1* 8.2*  MG  --  2.5*  --   --   --   --    Liver Function Tests: Recent Labs  Lab 05/17/19 1529  AST 28  ALT 17  ALKPHOS 65  BILITOT 1.1  PROT 7.1  ALBUMIN 3.6   No results for input(s): LIPASE, AMYLASE in the last 168 hours. No results for input(s): AMMONIA in the last 168 hours. CBC: Recent Labs  Lab 05/18/19 0430 05/18/19 0430 05/19/19 0233 05/19/19 1540 05/20/19 0500 05/20/19 2332 05/22/19 0407  WBC 12.2*  --  10.0  --  9.0 12.8* 9.8  HGB 7.5*   < > 7.2* 9.3* 8.8* 8.4* 8.8*  HCT 24.9*   < > 23.7* 29.2* 27.4* 26.8* 27.8*  MCV 102.9*  --  103.9*  --  96.8 96.8 98.9  PLT 304  --  283  --  302 313 308   < > = values in this interval not displayed.   Cardiac Enzymes:   No results for input(s): CKTOTAL, CKMB, CKMBINDEX, TROPONINI in the last 168 hours. BNP (last 3 results) Recent Labs    05/17/19 1529  BNP 957.7*    ProBNP (last 3 results) No results for input(s): PROBNP in the last 8760 hours.  CBG: No results for input(s): GLUCAP in the  last 168 hours.  Recent Results (from the past 240 hour(s))  SARS CORONAVIRUS 2 (TAT 6-24 HRS) Nasopharyngeal Nasopharyngeal Swab     Status: None   Collection Time: 05/17/19  6:02 PM   Specimen: Nasopharyngeal Swab  Result Value Ref Range Status   SARS Coronavirus 2 NEGATIVE NEGATIVE Final    Comment: (NOTE) SARS-CoV-2 target nucleic acids are NOT DETECTED. The SARS-CoV-2 RNA is generally detectable in upper and lower respiratory specimens during the acute phase of infection. Negative results do not preclude SARS-CoV-2 infection, do not rule out co-infections with other pathogens, and should not be used as the sole basis for treatment or other patient management decisions. Negative results must be combined with clinical observations, patient history, and epidemiological information. The expected result is Negative. Fact Sheet for Patients: SugarRoll.be Fact Sheet for Healthcare Providers: https://www.woods-mathews.com/ This test is not yet approved or cleared by the [place] FDA and  has been authorized for detection and/or diagnosis of SARS-CoV-2 by FDA under an Emergency Use Authorization (EUA). This EUA will remain  in effect (meaning this test can be used) for the duration of the COVID-19 declaration under Section 56 4(b)(1) of the Act, 21 U.S.C. section 360bbb-3(b)(1), unless the authorization is terminated or revoked sooner. Performed at [facility], [place] [address], [place], [place] [address]   MRSA PCR Screening     Status: None   Collection Time: 05/18/19  5:41 PM   Specimen: Nasal Mucosa; Nasopharyngeal  Result Value Ref Range Status   MRSA by PCR NEGATIVE NEGATIVE Final    Comment:        The GeneXpert MRSA Assay (FDA approved for NASAL specimens only), is one component of a comprehensive MRSA colonization surveillance program. It is not intended to diagnose MRSA infection nor to guide or monitor treatment  for MRSA infections. Performed at [facility], [place] [address], [place], [place] [address]   Respiratory Panel by PCR     Status: None   Collection Time: 05/21/19 12:06 PM   Specimen: Nasopharyngeal Swab;  Respiratory  Result Value Ref Range Status   Adenovirus NOT DETECTED NOT DETECTED Final   Coronavirus 229E NOT DETECTED NOT DETECTED Final    Comment: (NOTE) The Coronavirus on the Respiratory Panel, DOES NOT test for the novel  Coronavirus (2019 nCoV)    Coronavirus HKU1 NOT DETECTED NOT DETECTED Final   Coronavirus NL63 NOT DETECTED NOT DETECTED Final   Coronavirus OC43 NOT DETECTED NOT DETECTED Final   Metapneumovirus NOT DETECTED NOT DETECTED Final   Rhinovirus / Enterovirus NOT DETECTED NOT DETECTED Final   Influenza A NOT DETECTED NOT DETECTED Final   Influenza B NOT DETECTED NOT DETECTED Final   Parainfluenza Virus 1 NOT DETECTED NOT DETECTED Final   Parainfluenza Virus 2 NOT DETECTED NOT DETECTED Final   Parainfluenza Virus 3 NOT DETECTED NOT DETECTED Final   Parainfluenza Virus 4 NOT DETECTED NOT DETECTED Final   Respiratory Syncytial Virus NOT DETECTED NOT DETECTED Final   Bordetella pertussis NOT DETECTED NOT DETECTED Final   Chlamydophila pneumoniae NOT DETECTED NOT DETECTED Final   Mycoplasma pneumoniae NOT DETECTED NOT DETECTED Final    Comment: Performed at [facility], [place] [address] [place], [place] [address]  Expectorated sputum assessment w rflx to resp cult     Status: None   Collection Time: 05/21/19  1:41 PM   Specimen: Sputum  Result Value Ref Range Status   Specimen Description SPUTUM  Final   Special Requests NONE  Final   Sputum evaluation   Final    Sputum specimen not acceptable for testing.  Please recollect.   Performed at [facility], [place] [address] [place], [place] [address]    Report Status 05/21/2019 FINAL  Final     Studies: DG Chest 2 View  Result Date: 05/21/2019 CLINICAL DATA:   Shortness of breath. EXAM: CHEST - 2 VIEW COMPARISON:  May 17, 2019. FINDINGS: Stable cardiomediastinal silhouette. Atherosclerosis of thoracic aorta is noted. Right internal jugular Port-A-Cath is unchanged in position. Stable bilateral lung opacities are noted, right greater than left, concerning for edema or possibly inflammation. No pneumothorax is noted. Small left pleural effusion is noted. Bony thorax is unremarkable. IMPRESSION: Stable bilateral lung opacities are noted, right greater than left, concerning for edema or possibly inflammation. Small left pleural effusion. Aortic Atherosclerosis (ICD10-I70.0). Electronically Signed   By: Marijo Conception M.D.   On: 05/21/2019 11:30   VAS [place] LOWER EXTREMITY VENOUS (DVT)  Result Date: 05/21/2019  Lower Venous DVTStudy Indications: Swelling.  Risk Factors: Cancer metastatic cholangiocarcinoma. Anticoagulation: Heparin. Limitations: Left leg birth deformity. Comparison Study: No prior exam. Performing Technologist: Baldwin Crown ARDMS, RVT  Examination Guidelines: A complete evaluation includes B-mode imaging, spectral Doppler, color Doppler, and power Doppler as needed of all accessible portions of each vessel. Bilateral testing is considered an integral part of a complete examination. Limited examinations for reoccurring indications may be performed as noted. The reflux portion of the exam is performed with the patient in reverse Trendelenburg.  +---------+---------------+---------+-----------+----------+--------------+ RIGHT    CompressibilityPhasicitySpontaneityPropertiesThrombus Aging +---------+---------------+---------+-----------+----------+--------------+ CFV      Full           Yes      Yes                                 +---------+---------------+---------+-----------+----------+--------------+ SFJ      Full                                                         +---------+---------------+---------+-----------+----------+--------------+  FV Prox  Full                                                        +---------+---------------+---------+-----------+----------+--------------+ FV Mid   Full                                                        +---------+---------------+---------+-----------+----------+--------------+ FV DistalFull                                                        +---------+---------------+---------+-----------+----------+--------------+ PFV      Full                                                        +---------+---------------+---------+-----------+----------+--------------+ POP      Full           Yes      Yes                                 +---------+---------------+---------+-----------+----------+--------------+ PTV      Full                                                        +---------+---------------+---------+-----------+----------+--------------+ PERO     Full                                                        +---------+---------------+---------+-----------+----------+--------------+   +---------+---------------+---------+-----------+----------+--------------+ LEFT     CompressibilityPhasicitySpontaneityPropertiesThrombus Aging +---------+---------------+---------+-----------+----------+--------------+ CFV      Full           Yes      Yes                                 +---------+---------------+---------+-----------+----------+--------------+ SFJ      Full                                                        +---------+---------------+---------+-----------+----------+--------------+ FV Prox  Full                                                        +---------+---------------+---------+-----------+----------+--------------+  FV Mid   Full                                                         +---------+---------------+---------+-----------+----------+--------------+ FV DistalFull                                                        +---------+---------------+---------+-----------+----------+--------------+ PFV      Full                                                        +---------+---------------+---------+-----------+----------+--------------+ POP      Full           Yes      Yes                                 +---------+---------------+---------+-----------+----------+--------------+ PTV      Full                                                        +---------+---------------+---------+-----------+----------+--------------+ PERO     Full                                                        +---------+---------------+---------+-----------+----------+--------------+     Summary: RIGHT: - There is no evidence of deep vein thrombosis in the lower extremity.  - No cystic structure found in the popliteal fossa.  LEFT: - There is no evidence of deep vein thrombosis in the lower extremity. However, portions of this examination were limited- see technologist comments above.  - No cystic structure found in the popliteal fossa.  *See table(s) above for measurements and observations. Electronically signed by Ruta Hinds MD on 05/21/2019 at 7:05:42 PM.    Final     Scheduled Meds: . sodium chloride   Intravenous Once  . amLODipine  5 mg Oral Daily  . atorvastatin  20 mg Oral Daily  . benzonatate  100 mg Oral TID  . calcium-vitamin D  1 tablet Oral Daily  . chlorhexidine  15 mL Mouth Rinse BID  . Chlorhexidine Gluconate Cloth  6 each Topical Daily  . dicyclomine  20 mg Oral TID  . fluticasone furoate-vilanterol  1 puff Inhalation Daily  . furosemide  40 mg Oral Daily  . gabapentin  300 mg Oral BID  . heparin  5,000 Units Subcutaneous Q8H  . ipratropium-albuterol  3 mL Nebulization BID  . latanoprost  1 drop Both Eyes QHS  . levothyroxine  150 mcg  Oral QAC breakfast  . mouth rinse  15 mL Mouth Rinse q12n4p  . methylPREDNISolone (SOLU-MEDROL)  injection  40 mg Intravenous Q12H  . metoprolol tartrate  25 mg Oral BID  . pantoprazole  40 mg Oral Daily  . sodium chloride flush  10-40 mL Intracatheter Q12H  . sodium chloride flush  3 mL Intravenous Q12H    Continuous Infusions: . sodium chloride       Time spent: 27mns I have personally reviewed and interpreted on  05/22/2019 daily labs, tele strips, imagings as discussed above under date review session and assessment and plans.  I reviewed all nursing notes, pharmacy notes, consultant notes,  vitals, pertinent old records  I have discussed plan of care as described above with RN , patient and family on 05/22/2019   FFlorencia ReasonsMD, PhD, FACP  [facility]  Available via Epic secure chat 7am-7pm for nonurgent issues Please page for urgent issues, pager number available through [email or web address] .   05/22/2019, 7:05 AM  LOS: 5 days

## 2019-05-22 NOTE — Plan of Care (Signed)
  Problem: Education: Goal: Knowledge of General Education information will improve Description: Including pain rating scale, medication(s)/side effects and non-pharmacologic comfort measures Outcome: Progressing

## 2019-05-22 NOTE — Progress Notes (Signed)
Reported by telemetry at 2005  that patient had 14 runs of V.tach. Patient asymptomatic upon assessment. ON call made aware.

## 2019-05-22 NOTE — Progress Notes (Addendum)
HEMATOLOGY-ONCOLOGY PROGRESS NOTE  SUBJECTIVE: Ms. Astarita reports that her breathing has improved this morning.  She remains on O2 at 5 L/min.  She has no abdominal pain, nausea, vomiting.  States she is hungry this morning.  She has no other complaints today.  Oncology History  Cholangiocarcinoma ([place])  10/13/2017 Initial Diagnosis   Cholangiocarcinoma ([place])   10/13/2017 Cancer Staging   Staging form: Intrahepatic Bile Duct, AJCC 8th Edition - Clinical: Stage IA (cT1a, cN0, cM0) - Signed by Kyung Rudd, MD on 10/13/2017   09/12/2018 -  Chemotherapy   The patient had PACLitaxel-protein bound (ABRAXANE) chemo infusion 175 mg, 100 mg/m2 = 175 mg (100 % of original dose 100 mg/m2), Intravenous,  Once, 7 of 8 cycles Dose modification: 100 mg/m2 (original dose 100 mg/m2, Cycle 1, Reason: Provider Judgment) Administration: 175 mg (09/12/2018), 175 mg (09/27/2018), 175 mg (10/10/2018), 175 mg (10/24/2018), 175 mg (11/07/2018), 175 mg (12/05/2018), 175 mg (12/19/2018), 175 mg (01/02/2019), 175 mg (01/16/2019), 175 mg (02/06/2019), 175 mg (02/20/2019), 175 mg (03/19/2019), 175 mg (04/11/2019) gemcitabine (GEMZAR) 1,330 mg in sodium chloride 0.9 % 250 mL chemo infusion, 800 mg/m2 = 1,330 mg (100 % of original dose 800 mg/m2), Intravenous,  Once, 7 of 8 cycles Dose modification: 800 mg/m2 (original dose 800 mg/m2, Cycle 1, Reason: Provider Judgment) Administration: 1,330 mg (09/12/2018), 1,330 mg (09/27/2018), 1,330 mg (10/10/2018), 1,330 mg (10/24/2018), 1,330 mg (11/07/2018), 1,330 mg (12/05/2018), 1,330 mg (12/19/2018), 1,330 mg (01/02/2019), 1,330 mg (01/16/2019), 1,330 mg (02/06/2019), 1,330 mg (02/20/2019), 1,330 mg (03/19/2019), 1,330 mg (04/11/2019)  for chemotherapy treatment.     PHYSICAL EXAMINATION:  Vitals:   05/22/19 0659 05/22/19 0837  BP: (!) 153/73   Pulse: 66   Resp: (!) 22   Temp: 98.4 F (36.9 C)   SpO2: 90% 91%   Filed Weights   05/19/19 0600 05/20/19 0654 05/21/19 0459  Weight: 61.1 kg 59.7 kg 59.3 kg     Intake/Output from previous day: 03/15 0701 - 03/16 0700 In: -  Out: 900 [Urine:900]  GENERAL:alert, no distress and comfortable LUNGS: Bibasilar rales HEART: regular rate & rhythm and no murmurs and no lower extremity edema ABDOMEN:abdomen soft, non-tender and normal bowel sounds  NEURO: alert & oriented x 3 with fluent speech, no focal motor/sensory deficits  LABORATORY DATA:  I have reviewed the data as listed CMP Latest Ref Rng & Units 05/22/2019 05/20/2019 05/20/2019  Glucose 70 - 99 mg/dL 136(H) 142(H) 206(H)  BUN 8 - 23 mg/dL 59(H) 54(H) 51(H)  Creatinine 0.44 - 1.00 mg/dL 1.64(H) 1.68(H) 1.93(H)  Sodium 135 - 145 mmol/L 140 135 138  Potassium 3.5 - 5.1 mmol/L 4.2 3.4(L) 3.7  Chloride 98 - 111 mmol/L 105 98 100  CO2 22 - 32 mmol/L _0 Calcium 8.9 - 10.3 mg/dL 8.2(L) 8.1(L) 8.6(L)  Total Protein 6.5 - 8.1 g/dL - - -  Total Bilirubin 0.3 - 1.2 mg/dL - - -  Alkaline Phos 38 - 126 U/L - - -  AST 15 - 41 U/L - - -  ALT 0 - 44 U/L - - -    Lab Results  Component Value Date   WBC 9.8 05/22/2019   HGB 8.8 (L) 05/22/2019   HCT 27.8 (L) 05/22/2019   MCV 98.9 05/22/2019   PLT 308 05/22/2019   NEUTROABS 6.7 05/08/2019    DG Chest 2 View  Result Date: 05/21/2019 CLINICAL DATA:  Shortness of breath. EXAM: CHEST - 2 VIEW COMPARISON:  May 17, 2019. FINDINGS: Stable cardiomediastinal  silhouette. Atherosclerosis of thoracic aorta is noted. Right internal jugular Port-A-Cath is unchanged in position. Stable bilateral lung opacities are noted, right greater than left, concerning for edema or possibly inflammation. No pneumothorax is noted. Small left pleural effusion is noted. Bony thorax is unremarkable. IMPRESSION: Stable bilateral lung opacities are noted, right greater than left, concerning for edema or possibly inflammation. Small left pleural effusion. Aortic Atherosclerosis (ICD10-I70.0). Electronically Signed   By: Marijo Conception M.D.   On: 05/21/2019 11:30   CT  Chest High Resolution  Result Date: 05/22/2019 CLINICAL DATA:  86 year old female with history of hypoxemia. EXAM: CT CHEST WITHOUT CONTRAST TECHNIQUE: Multidetector CT imaging of the chest was performed following the standard protocol without intravenous contrast. High resolution imaging of the lungs, as well as inspiratory and expiratory imaging, was performed. COMPARISON:  Chest CT 02/16/2019. FINDINGS: Cardiovascular: Heart size is mildly enlarged with left atrial dilatation. There is no significant pericardial fluid, thickening or pericardial calcification. There is aortic atherosclerosis, as well as atherosclerosis of the great vessels of the mediastinum and the coronary arteries, including calcified atherosclerotic plaque in the left main, left anterior descending, left circumflex and right coronary arteries. Mediastinum/Nodes: No pathologically enlarged mediastinal or hilar lymph nodes. Esophagus is unremarkable in appearance. No axillary lymphadenopathy. Lungs/Pleura: High-resolution images demonstrate asymmetrically distributed patchy areas of ground-glass attenuation, septal thickening, thickening of the peribronchovascular interstitium, mild cylindrical bronchiectasis and regional architectural distortion. These findings have no craniocaudal gradient, and are more severe in the right lung than the left. Small bilateral pleural effusions lying dependently. Upper Abdomen: Hypoattenuating lesions in the liver, incompletely characterized on today's non-contrast CT examination, but grossly enlarged compared to the prior examination from 02/16/2019. Specifically, the largest lesion located predominantly in segments 4A & 4B has dramatically increased in size and currently measures at least 6.6 x 5.9 cm (axial image 129 of series 2) as compared with 2.9 x 2.4 cm on the prior study. Atrophy of the left kidney. Incompletely imaged low-attenuation lesion in the lateral aspect of the upper pole the right kidney,  previously characterized as a simple cyst. Aortic atherosclerosis. Status post cholecystectomy. Musculoskeletal: Chronic appearing compression fracture of T12 with 40% loss of anterior vertebral body height, unchanged. There are no aggressive appearing lytic or blastic lesions noted in the visualized portions of the skeleton. IMPRESSION: 1. Unusual appearance of the lungs, which is not favored to reflect interstitial lung disease. Rather, findings are favored to reflect an acute infectious or inflammatory response such as viral pneumonia or drug reaction. Some component of interstitial pulmonary edema is also possible, particularly in light of the cardiomegaly and bilateral pleural effusions, however, the asymmetry of the findings cannot be accounted for by pulmonary edema alone. 2. Enlargement of hypoattenuating liver lesions highly concerning for progressive disease in this patient with known history of cholangiocarcinoma. 3. Aortic atherosclerosis, in addition to left main and 3 vessel coronary artery disease. 4. Additional incidental findings, as above. Aortic Atherosclerosis (ICD10-I70.0). Electronically Signed   By: Vinnie Langton M.D.   On: 05/22/2019 08:25   DG Chest Port 1 View  Result Date: 05/17/2019 CLINICAL DATA:  Shortness of breath EXAM: PORTABLE CHEST 1 VIEW COMPARISON:  03/13/2019 FINDINGS: Cardiac shadow is enlarged but stable. Aortic calcifications are seen. Right chest wall port is noted. Increasing vascular congestion is noted with parenchymal edema worse on the right than the left. No bony abnormality is seen. IMPRESSION: Increasing vascular congestion and parenchymal edema. Electronically Signed   By: Linus Mako.D.  On: 05/17/2019 15:43   ECHOCARDIOGRAM COMPLETE  Result Date: 05/18/2019    ECHOCARDIOGRAM REPORT   Patient Name:   Summer Nielsen Date of Exam: 05/18/2019 Medical Rec #:  [id number]         Height:       62.0 in Accession #:    [id number]        Weight:       134.5  lb Date of Birth:  11/12/1927         BSA:          1.615 m Patient Age:    25 years          BP:           154/69 mmHg Patient Gender: F                 HR:           74 bpm. Exam Location:  Inpatient Procedure: 2D Echo, Cardiac Doppler and Color Doppler Indications:    I50.23 Acute on chronic systolic (congestive) heart failure  History:        Patient has prior history of Echocardiogram examinations, most                 recent 10/19/2018. TIA; Risk Factors:Hypertension and                 Dyslipidemia. Cancer. GERD. Hypothyroidism.  Sonographer:    Jonelle Sidle Dance Referring Phys: [id number] RIPUDEEP K RAI IMPRESSIONS  1. Left ventricular ejection fraction, by estimation, is 60 to 65%. The left ventricle has normal function. The left ventricle has no regional wall motion abnormalities. Left ventricular diastolic parameters are consistent with Grade II diastolic dysfunction (pseudonormalization).  2. Right ventricular systolic function is mildly reduced. The right ventricular size is normal. There is mildly elevated pulmonary artery systolic pressure.  3. Left atrial size was moderately dilated.  4. The mitral valve is grossly normal. Mild mitral valve regurgitation. No evidence of mitral stenosis.  5. The aortic valve is tricuspid. Aortic valve regurgitation is trivial. No aortic stenosis is present. FINDINGS  Left Ventricle: Left ventricular ejection fraction, by estimation, is 60 to 65%. The left ventricle has normal function. The left ventricle has no regional wall motion abnormalities. The left ventricular internal cavity size was normal in size. There is  no left ventricular hypertrophy. Left ventricular diastolic parameters are consistent with Grade II diastolic dysfunction (pseudonormalization). Right Ventricle: The right ventricular size is normal. No increase in right ventricular wall thickness. Right ventricular systolic function is mildly reduced. There is mildly elevated pulmonary artery systolic pressure. The  tricuspid regurgitant velocity  is 2.79 m/s, and with an assumed right atrial pressure of 3 mmHg, the estimated right ventricular systolic pressure is 62.1 mmHg. Left Atrium: Left atrial size was moderately dilated. Right Atrium: Right atrial size was normal in size. Pericardium: There is no evidence of pericardial effusion. Mitral Valve: The mitral valve is grossly normal. Moderate mitral annular calcification. Mild mitral valve regurgitation. No evidence of mitral valve stenosis. Tricuspid Valve: The tricuspid valve is normal in structure. Tricuspid valve regurgitation is mild . No evidence of tricuspid stenosis. Aortic Valve: The aortic valve is tricuspid. Aortic valve regurgitation is trivial. Aortic regurgitation PHT measures 454 msec. No aortic stenosis is present. Pulmonic Valve: The pulmonic valve was normal in structure. Pulmonic valve regurgitation is not visualized. No evidence of pulmonic stenosis. Aorta: The aortic root and ascending aorta are structurally normal, with no evidence of  dilitation. IAS/Shunts: The atrial septum is grossly normal.  LEFT VENTRICLE PLAX 2D LVIDd:         4.70 cm  Diastology LVIDs:         3.20 cm  LV e' lateral:   8.05 cm/s LV PW:         1.10 cm  LV E/e' lateral: 14.4 LV IVS:        1.00 cm  LV e' medial:    4.79 cm/s LVOT diam:     2.10 cm  LV E/e' medial:  24.2 LV SV:         70 LV SV Index:   43 LVOT Area:     3.46 cm  RIGHT VENTRICLE             IVC RV Basal diam:  2.10 cm     IVC diam: 1.70 cm RV S prime:     10.70 cm/s TAPSE (M-mode): 1.7 cm LEFT ATRIUM             Index       RIGHT ATRIUM          Index LA diam:        4.30 cm 2.66 cm/m  RA Area:     8.81 cm LA Vol (A2C):   67.1 ml 41.55 ml/m RA Volume:   12.50 ml 7.74 ml/m LA Vol (A4C):   70.9 ml 43.90 ml/m LA Biplane Vol: 68.8 ml 42.60 ml/m  AORTIC VALVE LVOT Vmax:   82.50 cm/s LVOT Vmean:  53.800 cm/s LVOT VTI:    0.202 m AI PHT:      454 msec  AORTA Ao Root diam: 3.20 cm Ao Asc diam:  2.80 cm MITRAL VALVE                 TRICUSPID VALVE MV Area (PHT): 3.77 cm     TR Peak grad:   31.1 mmHg MV Decel Time: 201 msec     TR Vmax:        279.00 cm/s MV E velocity: 116.00 cm/s MV A velocity: 81.90 cm/s   SHUNTS MV E/A ratio:  1.42         Systemic VTI:  0.20 m                             Systemic Diam: 2.10 cm Mertie Moores MD Electronically signed by Mertie Moores MD Signature Date/Time: 05/18/2019/11:20:46 AM    Final    VAS [place] LOWER EXTREMITY VENOUS (DVT)  Result Date: 05/21/2019  Lower Venous DVTStudy Indications: Swelling.  Risk Factors: Cancer metastatic cholangiocarcinoma. Anticoagulation: Heparin. Limitations: Left leg birth deformity. Comparison Study: No prior exam. Performing Technologist: Baldwin Crown ARDMS, RVT  Examination Guidelines: A complete evaluation includes B-mode imaging, spectral Doppler, color Doppler, and power Doppler as needed of all accessible portions of each vessel. Bilateral testing is considered an integral part of a complete examination. Limited examinations for reoccurring indications may be performed as noted. The reflux portion of the exam is performed with the patient in reverse Trendelenburg.  +---------+---------------+---------+-----------+----------+--------------+ RIGHT    CompressibilityPhasicitySpontaneityPropertiesThrombus Aging +---------+---------------+---------+-----------+----------+--------------+ CFV      Full           Yes      Yes                                 +---------+---------------+---------+-----------+----------+--------------+  SFJ      Full                                                        +---------+---------------+---------+-----------+----------+--------------+ FV Prox  Full                                                        +---------+---------------+---------+-----------+----------+--------------+ FV Mid   Full                                                         +---------+---------------+---------+-----------+----------+--------------+ FV DistalFull                                                        +---------+---------------+---------+-----------+----------+--------------+ PFV      Full                                                        +---------+---------------+---------+-----------+----------+--------------+ POP      Full           Yes      Yes                                 +---------+---------------+---------+-----------+----------+--------------+ PTV      Full                                                        +---------+---------------+---------+-----------+----------+--------------+ PERO     Full                                                        +---------+---------------+---------+-----------+----------+--------------+   +---------+---------------+---------+-----------+----------+--------------+ LEFT     CompressibilityPhasicitySpontaneityPropertiesThrombus Aging +---------+---------------+---------+-----------+----------+--------------+ CFV      Full           Yes      Yes                                 +---------+---------------+---------+-----------+----------+--------------+ SFJ      Full                                                        +---------+---------------+---------+-----------+----------+--------------+   FV Prox  Full                                                        +---------+---------------+---------+-----------+----------+--------------+ FV Mid   Full                                                        +---------+---------------+---------+-----------+----------+--------------+ FV DistalFull                                                        +---------+---------------+---------+-----------+----------+--------------+ PFV      Full                                                         +---------+---------------+---------+-----------+----------+--------------+ POP      Full           Yes      Yes                                 +---------+---------------+---------+-----------+----------+--------------+ PTV      Full                                                        +---------+---------------+---------+-----------+----------+--------------+ PERO     Full                                                        +---------+---------------+---------+-----------+----------+--------------+     Summary: RIGHT: - There is no evidence of deep vein thrombosis in the lower extremity.  - No cystic structure found in the popliteal fossa.  LEFT: - There is no evidence of deep vein thrombosis in the lower extremity. However, portions of this examination were limited- see technologist comments above.  - No cystic structure found in the popliteal fossa.  *See table(s) above for measurements and observations. Electronically signed by Ruta Hinds MD on 05/21/2019 at 7:05:42 PM.    Final     ASSESSMENT AND PLAN: 1. Anterior liver mass, ultrasound-guided biopsy 09/22/2017-adenocarcinoma, MSI-stable, tumor mutation burden 0,IDH1 R132G, BAP1 loss ? CT chest 09/07/2017, 4.1 cm hypodense anterior liver mass, changes of chronic hypersensitivity pneumonitis ? MRI abdomen 09/13/2017-isolated liver mass measuring 4.7 x 3.8 cm involving segments 4B and 5, no additional liver masses ? SBRT to the liver mass beginning 10/31/2017, completed 11/10/2017 ? CT abdomen/pelvis 01/23/2018-radiation changes in the liver, liver mass measuring 5.9 x 4.4 x 5.0 cm but no evidence  of metastatic disease in the abdomen or pelvis ? MRI abdomen 02/22/2018-dominant central left liver lesion slightly increased in size with decreased central enhancement suggesting necrosis, 2 new lesions including an 11 mm left hepatic lesion and a 9 mm right hepatic lesion ? Xeloda 7 days on/7 days off beginning 03/27/2018 (1500 mg  every morning and 1000 mg every afternoon) ? Xeloda dose reduced to 1000 mg every morning and 1000 mg every afternoon beginning 04/24/2018 due to diarrhea ? Xeloda discontinued after an office visit 05/15/2018 secondary to hand/foot syndrome and diarrhea ? CT abdomen/pelvis 06/12/2018-decrease in size of dominant left hepatic mass, no significant change in smaller liver lesions, stable biliary ductal dilatation, diffuse "colitis " ? CT 08/31/2018-decreased size of the segment 5 lesion, increased size of other liver lesions and new lesions, no evidence of extrahepatic metastatic disease, persistent colonic thickening ? Cycle 1 gemcitabine/Abraxane 09/12/2018 ? Cycle 2 gemcitabine/Abraxane 09/27/2018 ? Cycle 3 gemcitabine/Abraxane 10/10/2018 ? Cycle 4 gemcitabine/Abraxane 10/24/2018 ? Cycle 5 gemcitabine/Abraxane 11/07/2018 ? CT 11/20/2018, unchanged dominant segment 5 lesion, interval enlargement of local small hypodense lesions in the right liver, no evidence of distant metastatic disease, colonic wall thickening-resolved ? CTs reviewed by Dr. Merle Whitehorn/radiologist, consistent with stable disease ? Cycle 6 gemcitabine/Abraxane 12/05/2018 ? Cycle 7 gemcitabine/Abraxane 12/19/2018 ? Cycle 8 gemcitabine/Abraxane 01/02/2019 ? Cycle 9 gemcitabine/Abraxane 01/16/2019 ? Cycle 10 gemcitabine/Abraxane 02/06/2019 ? CTs 02/16/2019-stable liver lesions. Chronic changes of interstitial lung disease, appears mildly progressive. New trace bilateral pleural effusions and/or areas of pleural thickening. ? Cycle 11 gemcitabine/Abraxane 02/20/2019 (schedule changedto every 3 weeks going forward) ? Cycle 12 gemcitabine/Abraxane 03/19/2019 ? Cycle 13 gemcitabine/Abraxane 04/11/2019  2. Abdominal pain 3. History of diastolic heart failure 4. Hypertension 5. G3, P3 6. History of anemia 7. Right foot cellulitis 05/22/2018-Keflex prescribed 05/23/2018 C. difficile colitis 06/14/2018-treated with vancomycin, recurrent diarrhea  07/04/2018-second course of vancomycin (prolonged taper) prescribed, persistent diarrhea 08/06/2018-improved after diet change 8. Hospital admission 03/13/2019-partial expressive aphasia and dehydration; negative MRI of the brain 03/14/2019.  Mental status improved. 9.  Interstitial lung disease  Ms. Cupps appears stable.  Respiratory status improving. She was supposed to be seen by [place] as an outpatient today with restaging CT scans.  She had a CT scan of the chest performed yesterday which showed enlargement of her liver lesions concerning for progressive disease.  Labs showed mild anemia which is stable.  Recommendations: 1.  We will review the CT of the chest from yesterday in more detail.  Radiologist read indicates that there is some disease progression but we will compare to prior images.  Once this has been reviewed, we will determine if she needs to have a CT of the abdomen pelvis to complete her restaging work-up. 2.  Monitor CBC closely and transfuse PRBCs for hemoglobin less than 7.   LOS: 5 days   Mikey Bussing, DNP, AGPCNP-BC, AOCNP 05/22/19 Ms. Ferdinand was interviewed and examined.  She is admitted with respiratory failure.  I reviewed the chest CT images.  She has diffuse lung infiltrates on CT suggestive of pneumonitis.  It is possible the lung changes are related to chemotherapy pneumonitis in the setting of underlying pulmonary fibrosis. The CT reveals progressive cholangiocarcinoma in the liver.  I discussed the CT findings and treatment options with Ms. Krieger.  She understands the cholangiocarcinoma has progressed in the liver.  No therapy will be curative.  I do not recommend second line chemotherapy in her case.  She agrees to home hospice care.  I will make  a referral to [facility].  I will plan to serve as the primary provider with the hospice team.  Recommendations: 1.  Continue management of respiratory failure to include steroid taper, per cardiology and pulmonary  medicine 2.  Hospice referral 3.  Outpatient follow-up will be scheduled at the Cancer center.

## 2019-05-22 NOTE — Progress Notes (Addendum)
NAME:  Summer Nielsen, MRN:  [id number], DOB:  [birth year], LOS: 5 ADMISSION DATE:  05/17/2019, CONSULTATION DATE:  05/21/2019 REFERRING MD:  Dr. Tana Coast, CHIEF COMPLAINT:  Dypsnea   Brief History   86 year old female admitted for presumed volume overload and pulmonary edema. She did not improve as expected with diuresis and PCCM was consulted for further evaluation.   History of present illness   86 year old female with PMH as below, which is significant for metastatic cholangiocarcinoma completed cycle of gemcitabine/Abraxane on 2/3, CHF, and has recently been undergoing workup for pulmonary fibrosis with Dr. Vaughan Browner on has been on 2L [place] at home. He felt as though imaging was most consistent with hypersensitivity, but no clear exposures. She was referred for CTD serologies, PFT, and Breo. In collaboration with oncology, she was asked to hold losartan and lasix pending her CT, and unfortunately she developed respiratory distress in the days leading up to her presentation of 3/11.  Her oxygen saturations were in the 70s on room air and she was placed supplemental O2 and admitted to the hospitalists service with the presumptive diagnosis of pulmonary edema/volume overload in the setting of marked lower extremity edema and having held her lasix for several days. CXR consistent with this. She was treated with IV diuresis for several days. Symptoms somewhat improved, however, she remained on 6L [place], up from her previous 2L. She began to have renal insufficiency, and diuresis was stopped 3/15. PCCM asked to see for further evaluation.   Past Medical History   has a past medical history of Cancer ([place]), CHF (congestive heart failure) ([place]), Cholangiocarcinoma ([place]) (dx'd 04/5954), Complication of anesthesia, GERD (gastroesophageal reflux disease), Glaucoma, High blood pressure, Hyperlipidemia, Hypothyroidism, OP (osteoporosis), PONV (postoperative nausea and vomiting), Postsurgical hypothyroidism, Pulmonary fibrosis  ([place]), Spondylosis of cervical region without myelopathy or radiculopathy, Tachycardia, and TIA (transient ischemic attack).   Significant Hospital Events   3/11 admit  Consults:  Cardiology Pulm  Procedures:    Significant Diagnostic Tests:  HRCT 3/15 > interstitial prominence felt to reflect pulmonary edema vs infectious response. Bilateral pleural effusions, asymmetric.   Micro Data:  COVID 3/15 neg  Antimicrobials:    Interim history/subjective:  Breathing better today than yesterday.   Objective   Blood pressure (!) 156/63, pulse 65, temperature 97.9 F (36.6 C), temperature source Oral, resp. rate 20, height _0  (1.575 m), weight 59.3 kg, SpO2 93 %.        Intake/Output Summary (Last 24 hours) at 05/22/2019 1626 Last data filed at 05/22/2019 1300 Gross per 24 hour  Intake 360 ml  Output 1200 ml  Net -840 ml   Filed Weights   05/19/19 0600 05/20/19 0654 05/21/19 0459  Weight: 61.1 kg 59.7 kg 59.3 kg    Examination:  General: Frail elderly female in NAD HENT: [place]/AT, PERRL, no JVD Lungs: Coarse crackles.  Cardiovascular: Tachy, regular, no MRG Abdomen: Soft, non-tender, non-distended Extremities: No acute deformity or ROM limitation Neuro: Alert, oriented, non-focal  Resolved Hospital Problem list     Assessment & Plan:   Acute hypoxemic respiratory failure requiring 6L [place] (up from recent baseline of 2L). Upon admission was markedly volume overloaded. She has been diuresed and feels a lot better, but remains very dyspneic. Need to rule out infectious etiology as well as venous thromboembolic disease. If this is rapid progression of ILD, there will be few options. Dopplers negative. She was started on IV steroids 3/14. ANA positive, CCP 54. Rheumatoid factor negative. HRCT  3/15 findings consistent with ongoing volume issues considering interstitial prominence and bilateral effusions. RVP negative.  - Agree with continued diuretic at home dose lasix -  Sputum culture pending - Continue Breo, duoneb - V/Q scan will likely be of little guidance. - Nothing further to add from a pulmonary perspective.  - Agree with palliative consult.  Metastatic cholangiocarcinoma: finished course of gemcitabine/Abraxane 2/3. Followed by Dr. Benay Spice. - Management per oncology. - Further therapy pending repeat imaging.   Best practice:  Diet: per primary Pain/Anxiety/Delirium protocol (if indicated): NA VAP protocol (if indicated): NA DVT prophylaxis: per primary GI prophylaxis: per primary Glucose control: SSI Mobility: per primary Code Status: FULL CODE Family Communication: patient   Labs   CBC: Recent Labs  Lab 05/18/19 0430 05/18/19 0430 05/19/19 0233 05/19/19 1540 05/20/19 0500 05/20/19 2332 05/22/19 0407  WBC 12.2*  --  10.0  --  9.0 12.8* 9.8  HGB 7.5*   < > 7.2* 9.3* 8.8* 8.4* 8.8*  HCT 24.9*   < > 23.7* 29.2* 27.4* 26.8* 27.8*  MCV 102.9*  --  103.9*  --  96.8 96.8 98.9  PLT 304  --  283  --  302 313 308   < > = values in this interval not displayed.    Basic Metabolic Panel: Recent Labs  Lab 05/18/19 0430 05/18/19 0810 05/19/19 0233 05/20/19 0500 05/20/19 2332 05/22/19 0407  NA 137  --  139 138 135 140  K 4.7  --  4.0 3.7 3.4* 4.2  CL 105  --  104 100 98 105  CO2 21*  --  _0 GLUCOSE 104*  --  91 206* 142* 136*  BUN 41*  --  43* 51* 54* 59*  CREATININE 1.54*  --  1.57* 1.93* 1.68* 1.64*  CALCIUM 9.0  --  8.9 8.6* 8.1* 8.2*  MG  --  2.5*  --   --   --   --    GFR: Estimated Creatinine Clearance: 18 mL/min (A) (by C-G formula based on SCr of 1.64 mg/dL (H)). Recent Labs  Lab 05/17/19 1534 05/18/19 0430 05/19/19 0233 05/20/19 0500 05/20/19 2332 05/22/19 0407  WBC  --    < > 10.0 9.0 12.8* 9.8  LATICACIDVEN 0.7  --   --   --   --   --    < > = values in this interval not displayed.    Liver Function Tests: Recent Labs  Lab 05/17/19 1529  AST 28  ALT 17  ALKPHOS 65  BILITOT 1.1  PROT 7.1    ALBUMIN 3.6   No results for input(s): LIPASE, AMYLASE in the last 168 hours. No results for input(s): AMMONIA in the last 168 hours.  ABG No results found for: PHART, PCO2ART, PO2ART, HCO3, TCO2, ACIDBASEDEF, O2SAT   Coagulation Profile: No results for input(s): INR, PROTIME in the last 168 hours.  Cardiac Enzymes: No results for input(s): CKTOTAL, CKMB, CKMBINDEX, TROPONINI in the last 168 hours.  HbA1C: No results found for: HGBA1C  CBG: No results for input(s): GLUCAP in the last 168 hours.     Georgann Housekeeper, AGACNP-BC [place] for personal pager PCCM on call pager [phone]  05/22/2019 4:26 PM     PCCM:  86 year old female, history of metastatic cholangiocarcinoma, ILD, volume overload pulmonary edema.  HRCT completed yesterday with bilateral pleural effusions and areas of groundglass concerning for pulmonary edema.  Images reviewed by me.  BP (!) 153/62 (  BP Location: Right Arm)   Pulse 67   Temp 98.6 F (37 C) (Oral)   Resp 20   Ht _0  (1.575 m)   Wt 59.3 kg   LMP  (LMP Unknown)   SpO2 90%   BMI 23.91 kg/m   General: Elderly female resting in bed no distress Neck: No JVD Heart: Regular rate rhythm S1-S2 Lungs: Bilateral inspiratory crackles  Labs: Reviewed Imaging: Reviewed as stated above  Assessment: Acute on chronic hypoxemic respiratory failure secondary to acute exacerbation of ILD, bilateral acute pulmonary edema, pulmonary hypertension secondary to lung disease Metastatic cholangiocarcinoma  Plan: Continue to wean FiO2 Continue diuresis Agree with palliative care involvement. Encourage up out of bed and in chair as much as tolerated  Garner Nash, DO [facility] 05/22/2019 5:53 PM

## 2019-05-23 ENCOUNTER — Ambulatory Visit: Payer: Medicare PPO | Admitting: Nurse Practitioner

## 2019-05-23 DIAGNOSIS — Z7189 Other specified counseling: Secondary | ICD-10-CM

## 2019-05-23 DIAGNOSIS — Z515 Encounter for palliative care: Secondary | ICD-10-CM

## 2019-05-23 DIAGNOSIS — R531 Weakness: Secondary | ICD-10-CM

## 2019-05-23 LAB — BASIC METABOLIC PANEL
Anion gap: 10 (ref 5–15)
BUN: 68 mg/dL — ABNORMAL HIGH (ref 8–23)
CO2: 25 mmol/L (ref 22–32)
Calcium: 8.3 mg/dL — ABNORMAL LOW (ref 8.9–10.3)
Chloride: 105 mmol/L (ref 98–111)
Creatinine, Ser: 1.58 mg/dL — ABNORMAL HIGH (ref 0.44–1.00)
GFR calc Af Amer: 33 mL/min — ABNORMAL LOW (ref >60)
GFR calc non Af Amer: 29 mL/min — ABNORMAL LOW (ref >60)
Glucose, Bld: 146 mg/dL — ABNORMAL HIGH (ref 70–99)
Potassium: 4.1 mmol/L (ref 3.5–5.1)
Sodium: 140 mmol/L (ref 135–145)

## 2019-05-23 LAB — CBC
HCT: 30.1 % — ABNORMAL LOW (ref 36.0–46.0)
Hemoglobin: 9.3 g/dL — ABNORMAL LOW (ref 12.0–15.0)
MCH: 30.3 pg (ref 26.0–34.0)
MCHC: 30.9 g/dL (ref 30.0–36.0)
MCV: 98 fL (ref 80.0–100.0)
Platelets: 307 10*3/uL (ref 150–400)
RBC: 3.07 MIL/uL — ABNORMAL LOW (ref 3.87–5.11)
RDW: 17.4 % — ABNORMAL HIGH (ref 11.5–15.5)
WBC: 8.6 10*3/uL (ref 4.0–10.5)
nRBC: 0 % (ref 0.0–0.2)

## 2019-05-23 LAB — MAGNESIUM: Magnesium: 2.4 mg/dL (ref 1.7–2.4)

## 2019-05-23 NOTE — Progress Notes (Signed)
PROGRESS NOTE  Summer Nielsen WGN:[id number] DOB: [birth year] DOA: 05/17/2019 PCP: Maurice Small, MD   Brief history: This is a 86 year old female with history of metastatic cholangiocarcinoma to the liver, pulmonary fibrosis, ILD/ILD, chronic diastolic CHF, hypertension, TIA presented to the emergency room with shortness of breath . -Admitted with acute on chronic respiratory failure secondary to CHF and pulmonary fibrosis   HPI/Recap of past 24 hours: -Breathing a little better, upset about Dr. Gearldine Shown recommendation for hospice   Assessment/Plan:  Acute respiratory failure (HCC)with hypoxia - likely secondary toAcute on chronic diastolic CHF  - and underlying pulmonary fibrosis/interstitial lung disease  -Cardiology consulted. 2D echo showed EF of 60 to 78%, grade 2 diastolic dysfunction -At baseline on 4 L home O2, currently on 6 L  -Diuresed with IV Lasix, initially placed on BiPAP due to respiratory distress and hypoxia -Now on oral Lasix -Per Dr. Learta Codding there is also possibility that her chemo could have contributed to some pneumonitis -Oncology discussed hospice and recommended home hospice to the patient today -Case management consult  Cholangiocarcinoma metastatic to liver [facility]), history of pulmonary fibrosis -Completed chemotherapy on 04/11/2019,  -Imaging notes worsening liver metastasis -followed Dr. Benay Spice, not felt to be a candidate for second line chemotherapy, he recommended home hospice today  AKI on CKD 3 -Creatinine peaked at 1.93 on March 14, trending down towards baseline around 1.57 -Slight worsening azotemia, suspect steroid contribute, monitor -Renal dosing meds  history of TIA (transient ischemic attack) -Continue statin -Aspirin currently on hold, FOBT positive, also have blood tinged sputum  Anemia, macrocytic: Likely anemia of chronic disease, malignancy -Due to iron deficiency, malignancy and chemo  Hyperlipidemia -Continue  statin   Hyperkalemia -Resolved potassium 3.7   Hyperglycemia -CBGs improving with tapering of steroids  Postsurgical hypothyroidism -TSH 4.6 Continue Synthroid  History of C. difficile in 2020 -Patient states she has off-and-on diarrhea, no increase, no abdominal pain, fever chills  DVT Prophylaxis: SCDs  Code Status: Full  Family Communication: No family at bedside, updated son Elta [place]  Disposition Plan: Home with home hospice tomorrow if stable  Consultants:  GI, signed off  Oncology  Pulmonology  Cardiology  Palliative care  Procedures:  BiPAP briefly  Antibiotics:  None   Objective: BP (!) 169/70 (BP Location: Right Arm)   Pulse 71   Temp 97.8 F (36.6 C) (Oral)   Resp 16   Ht _0  (1.575 m)   Wt 61.5 kg   LMP  (LMP Unknown)   SpO2 93%   BMI 24.80 kg/m   Intake/Output Summary (Last 24 hours) at 05/23/2019 1410 Last data filed at 05/23/2019 0509 Gross per 24 hour  Intake 363 ml  Output 300 ml  Net 63 ml   Filed Weights   05/20/19 0654 05/21/19 0459 05/23/19 0500  Weight: 59.7 kg 59.3 kg 61.5 kg    Exam: Gen: Frail elderly female sitting up in bed, AAOx3 HEENT: PERRLA, Neck supple, no JVD Lungs: Bilateral rales CVS: S1-S2, regular rate rhythm Abd: soft, Non tender, non distended, BS present Extremities: No edema  Skin: no new rashes  Data Reviewed: Basic Metabolic Panel: Recent Labs  Lab 05/18/19 0430 05/18/19 0810 05/19/19 0233 05/20/19 0500 05/20/19 2332 05/22/19 0407 05/23/19 0407  NA   < >  --  139 138 135 140 140  K   < >  --  4.0 3.7 3.4* 4.2 4.1  CL   < >  --  104 100 98 105 105  CO2   < >  --  _0 GLUCOSE   < >  --  91 206* 142* 136* 146*  BUN   < >  --  43* 51* 54* 59* 68*  CREATININE   < >  --  1.57* 1.93* 1.68* 1.64* 1.58*  CALCIUM   < >  --  8.9 8.6* 8.1* 8.2* 8.3*  MG  --  2.5*  --   --   --   --  2.4   < > = values in this interval not displayed.   Liver Function Tests: Recent Labs    Lab 05/17/19 1529  AST 28  ALT 17  ALKPHOS 65  BILITOT 1.1  PROT 7.1  ALBUMIN 3.6   No results for input(s): LIPASE, AMYLASE in the last 168 hours. No results for input(s): AMMONIA in the last 168 hours. CBC: Recent Labs  Lab 05/19/19 0233 05/19/19 0233 05/19/19 1540 05/20/19 0500 05/20/19 2332 05/22/19 0407 05/23/19 0407  WBC 10.0  --   --  9.0 12.8* 9.8 8.6  HGB 7.2*   < > 9.3* 8.8* 8.4* 8.8* 9.3*  HCT 23.7*   < > 29.2* 27.4* 26.8* 27.8* 30.1*  MCV 103.9*  --   --  96.8 96.8 98.9 98.0  PLT 283  --   --  302 313 308 307   < > = values in this interval not displayed.   Cardiac Enzymes:   No results for input(s): CKTOTAL, CKMB, CKMBINDEX, TROPONINI in the last 168 hours. BNP (last 3 results) Recent Labs    05/17/19 1529  BNP 957.7*    ProBNP (last 3 results) No results for input(s): PROBNP in the last 8760 hours.  CBG: No results for input(s): GLUCAP in the last 168 hours.  Recent Results (from the past 240 hour(s))  SARS CORONAVIRUS 2 (TAT 6-24 HRS) Nasopharyngeal Nasopharyngeal Swab     Status: None   Collection Time: 05/17/19  6:02 PM   Specimen: Nasopharyngeal Swab  Result Value Ref Range Status   SARS Coronavirus 2 NEGATIVE NEGATIVE Final    Comment: (NOTE) SARS-CoV-2 target nucleic acids are NOT DETECTED. The SARS-CoV-2 RNA is generally detectable in upper and lower respiratory specimens during the acute phase of infection. Negative results do not preclude SARS-CoV-2 infection, do not rule out co-infections with other pathogens, and should not be used as the sole basis for treatment or other patient management decisions. Negative results must be combined with clinical observations, patient history, and epidemiological information. The expected result is Negative. Fact Sheet for Patients: SugarRoll.be Fact Sheet for Healthcare Providers: https://www.woods-mathews.com/ This test is not yet approved or cleared  by the [place] FDA and  has been authorized for detection and/or diagnosis of SARS-CoV-2 by FDA under an Emergency Use Authorization (EUA). This EUA will remain  in effect (meaning this test can be used) for the duration of the COVID-19 declaration under Section 56 4(b)(1) of the Act, 21 U.S.C. section 360bbb-3(b)(1), unless the authorization is terminated or revoked sooner. Performed at [facility], [place] [address], [place], [place] [address]   MRSA PCR Screening     Status: None   Collection Time: 05/18/19  5:41 PM   Specimen: Nasal Mucosa; Nasopharyngeal  Result Value Ref Range Status   MRSA by PCR NEGATIVE NEGATIVE Final    Comment:        The GeneXpert MRSA Assay (FDA approved for NASAL specimens only), is one component of a comprehensive MRSA colonization surveillance program. It is not intended  to diagnose MRSA infection nor to guide or monitor treatment for MRSA infections. Performed at [facility], [place] [address], [place], [place] [address]   Respiratory Panel by PCR     Status: None   Collection Time: 05/21/19 12:06 PM   Specimen: Nasopharyngeal Swab; Respiratory  Result Value Ref Range Status   Adenovirus NOT DETECTED NOT DETECTED Final   Coronavirus 229E NOT DETECTED NOT DETECTED Final    Comment: (NOTE) The Coronavirus on the Respiratory Panel, DOES NOT test for the novel  Coronavirus (2019 nCoV)    Coronavirus HKU1 NOT DETECTED NOT DETECTED Final   Coronavirus NL63 NOT DETECTED NOT DETECTED Final   Coronavirus OC43 NOT DETECTED NOT DETECTED Final   Metapneumovirus NOT DETECTED NOT DETECTED Final   Rhinovirus / Enterovirus NOT DETECTED NOT DETECTED Final   Influenza A NOT DETECTED NOT DETECTED Final   Influenza B NOT DETECTED NOT DETECTED Final   Parainfluenza Virus 1 NOT DETECTED NOT DETECTED Final   Parainfluenza Virus 2 NOT DETECTED NOT DETECTED Final   Parainfluenza Virus 3 NOT DETECTED NOT DETECTED Final   Parainfluenza  Virus 4 NOT DETECTED NOT DETECTED Final   Respiratory Syncytial Virus NOT DETECTED NOT DETECTED Final   Bordetella pertussis NOT DETECTED NOT DETECTED Final   Chlamydophila pneumoniae NOT DETECTED NOT DETECTED Final   Mycoplasma pneumoniae NOT DETECTED NOT DETECTED Final    Comment: Performed at [facility], [place] [address], [place], [place] [address]  Expectorated sputum assessment w rflx to resp cult     Status: None   Collection Time: 05/21/19  1:41 PM   Specimen: Sputum  Result Value Ref Range Status   Specimen Description SPUTUM  Final   Special Requests NONE  Final   Sputum evaluation   Final    Sputum specimen not acceptable for testing.  Please recollect.   Performed at [facility], [place] [address] [place],  [address]    Report Status 05/21/2019 FINAL  Final     Studies: No results found.  Scheduled Meds: . sodium chloride   Intravenous Once  . amLODipine  5 mg Oral Daily  . atorvastatin  20 mg Oral Daily  . benzonatate  100 mg Oral TID  . calcium-vitamin D  1 tablet Oral Daily  . chlorhexidine  15 mL Mouth Rinse BID  . Chlorhexidine Gluconate Cloth  6 each Topical Daily  . dicyclomine  20 mg Oral TID  . fluticasone furoate-vilanterol  1 puff Inhalation Daily  . furosemide  40 mg Oral Daily  . gabapentin  300 mg Oral BID  . heparin  5,000 Units Subcutaneous Q8H  . ipratropium-albuterol  3 mL Nebulization BID  . latanoprost  1 drop Both Eyes QHS  . levothyroxine  150 mcg Oral QAC breakfast  . mouth rinse  15 mL Mouth Rinse q12n4p  . methylPREDNISolone (SOLU-MEDROL) injection  40 mg Intravenous Q12H  . metoprolol tartrate  50 mg Oral BID  . pantoprazole  40 mg Oral Daily  . sodium chloride flush  10-40 mL Intracatheter Q12H  . sodium chloride flush  3 mL Intravenous Q12H    Continuous Infusions: . sodium chloride       Time spent: 68mns  PDomenic PoliteMD, [facility] .   05/23/2019, 2:10 PM  LOS: 6 days

## 2019-05-23 NOTE — Progress Notes (Addendum)
Progress Note  Patient Name: Summer Nielsen Date of Encounter: 05/23/2019  Primary Cardiologist: Candee Furbish, MD   Subjective   Patient feels her breathing is a little better. Still on 5-6L O2. No chest pain. Put out 660m urine overnight. 3beats NSVT seen on telemetry  Inpatient Medications    Scheduled Meds: . sodium chloride   Intravenous Once  . amLODipine  5 mg Oral Daily  . atorvastatin  20 mg Oral Daily  . benzonatate  100 mg Oral TID  . calcium-vitamin D  1 tablet Oral Daily  . chlorhexidine  15 mL Mouth Rinse BID  . Chlorhexidine Gluconate Cloth  6 each Topical Daily  . dicyclomine  20 mg Oral TID  . fluticasone furoate-vilanterol  1 puff Inhalation Daily  . furosemide  40 mg Oral Daily  . gabapentin  300 mg Oral BID  . heparin  5,000 Units Subcutaneous Q8H  . ipratropium-albuterol  3 mL Nebulization BID  . latanoprost  1 drop Both Eyes QHS  . levothyroxine  150 mcg Oral QAC breakfast  . mouth rinse  15 mL Mouth Rinse q12n4p  . methylPREDNISolone (SOLU-MEDROL) injection  40 mg Intravenous Q12H  . metoprolol tartrate  50 mg Oral BID  . pantoprazole  40 mg Oral Daily  . sodium chloride flush  10-40 mL Intracatheter Q12H  . sodium chloride flush  3 mL Intravenous Q12H   Continuous Infusions: . sodium chloride     PRN Meds: sodium chloride, acetaminophen, albuterol, guaiFENesin-dextromethorphan, hydrALAZINE, loperamide, ondansetron (ZOFRAN) IV, sodium chloride flush, sodium chloride flush   Vital Signs    Vitals:   05/23/19 0025 05/23/19 0406 05/23/19 0500 05/23/19 0506  BP: (!) 154/70 (!) 160/70    Pulse: 68 64    Resp: 20 19    Temp: (!) 97.1 F (36.2 C)   97.7 F (36.5 C)  TempSrc: Oral   Other (Comment)  SpO2: 93% 93%    Weight:   61.5 kg   Height:        Intake/Output Summary (Last 24 hours) at 05/23/2019 0738 Last data filed at 05/23/2019 0509 Gross per 24 hour  Intake 723 ml  Output 600 ml  Net 123 ml   Last 3 Weights 05/23/2019  05/21/2019 05/20/2019  Weight (lbs) 135 lb 9.3 oz 130 lb 11.2 oz 131 lb 9.8 oz  Weight (kg) 61.5 kg 59.285 kg 59.7 kg      Telemetry    NSR, HR 60s, 3 beats NSVT, PVCs - Personally Reviewed  ECG    No new - Personally Reviewed  Physical Exam   GEN: No acute distress.   Neck: No JVD Cardiac: RRR, no murmurs, rubs, or gallops.  Respiratory: Diffuse crackles. GI: Soft, nontender, non-distended  MS: No edema; No deformity. Neuro:  Nonfocal  Psych: Normal affect   Labs    High Sensitivity Troponin:   Recent Labs  Lab 05/17/19 1529 05/17/19 1806  TROPONINIHS 6 6      Chemistry Recent Labs  Lab 05/17/19 1529 05/18/19 0430 05/20/19 2332 05/22/19 0407 05/23/19 0407  NA 137   < > 135 140 140  K 5.5*   < > 3.4* 4.2 4.1  CL 107   < > 98 105 105  CO2 21*   < > _0 GLUCOSE 139*   < > 142* 136* 146*  BUN 42*   < > 54* 59* 68*  CREATININE 1.47*   < > 1.68* 1.64* 1.58*  CALCIUM 9.4   < >  8.1* 8.2* 8.3*  PROT 7.1  --   --   --   --   ALBUMIN 3.6  --   --   --   --   AST 28  --   --   --   --   ALT 17  --   --   --   --   ALKPHOS 65  --   --   --   --   BILITOT 1.1  --   --   --   --   GFRNONAA 31*   < > 26* 27* 29*  GFRAA 36*   < > 31* 32* 33*  ANIONGAP 9   < > _0 < > = values in this interval not displayed.     Hematology Recent Labs  Lab 05/20/19 2332 05/22/19 0407 05/23/19 0407  WBC 12.8* 9.8 8.6  RBC 2.77* 2.81* 3.07*  HGB 8.4* 8.8* 9.3*  HCT 26.8* 27.8* 30.1*  MCV 96.8 98.9 98.0  MCH 30.3 31.3 30.3  MCHC 31.3 31.7 30.9  RDW 18.2* 17.8* 17.4*  PLT 313 308 307    BNP Recent Labs  Lab 05/17/19 1529  BNP 957.7*     DDimer No results for input(s): DDIMER in the last 168 hours.   Radiology    DG Chest 2 View  Result Date: 05/21/2019 CLINICAL DATA:  Shortness of breath. EXAM: CHEST - 2 VIEW COMPARISON:  May 17, 2019. FINDINGS: Stable cardiomediastinal silhouette. Atherosclerosis of thoracic aorta is noted. Right internal jugular  Port-A-Cath is unchanged in position. Stable bilateral lung opacities are noted, right greater than left, concerning for edema or possibly inflammation. No pneumothorax is noted. Small left pleural effusion is noted. Bony thorax is unremarkable. IMPRESSION: Stable bilateral lung opacities are noted, right greater than left, concerning for edema or possibly inflammation. Small left pleural effusion. Aortic Atherosclerosis (ICD10-I70.0). Electronically Signed   By: Marijo Conception M.D.   On: 05/21/2019 11:30   CT Chest High Resolution  Result Date: 05/22/2019 CLINICAL DATA:  86 year old female with history of hypoxemia. EXAM: CT CHEST WITHOUT CONTRAST TECHNIQUE: Multidetector CT imaging of the chest was performed following the standard protocol without intravenous contrast. High resolution imaging of the lungs, as well as inspiratory and expiratory imaging, was performed. COMPARISON:  Chest CT 02/16/2019. FINDINGS: Cardiovascular: Heart size is mildly enlarged with left atrial dilatation. There is no significant pericardial fluid, thickening or pericardial calcification. There is aortic atherosclerosis, as well as atherosclerosis of the great vessels of the mediastinum and the coronary arteries, including calcified atherosclerotic plaque in the left main, left anterior descending, left circumflex and right coronary arteries. Mediastinum/Nodes: No pathologically enlarged mediastinal or hilar lymph nodes. Esophagus is unremarkable in appearance. No axillary lymphadenopathy. Lungs/Pleura: High-resolution images demonstrate asymmetrically distributed patchy areas of ground-glass attenuation, septal thickening, thickening of the peribronchovascular interstitium, mild cylindrical bronchiectasis and regional architectural distortion. These findings have no craniocaudal gradient, and are more severe in the right lung than the left. Small bilateral pleural effusions lying dependently. Upper Abdomen: Hypoattenuating lesions  in the liver, incompletely characterized on today's non-contrast CT examination, but grossly enlarged compared to the prior examination from 02/16/2019. Specifically, the largest lesion located predominantly in segments 4A & 4B has dramatically increased in size and currently measures at least 6.6 x 5.9 cm (axial image 129 of series 2) as compared with 2.9 x 2.4 cm on the prior study. Atrophy of the left kidney. Incompletely imaged low-attenuation lesion in the  lateral aspect of the upper pole the right kidney, previously characterized as a simple cyst. Aortic atherosclerosis. Status post cholecystectomy. Musculoskeletal: Chronic appearing compression fracture of T12 with 40% loss of anterior vertebral body height, unchanged. There are no aggressive appearing lytic or blastic lesions noted in the visualized portions of the skeleton. IMPRESSION: 1. Unusual appearance of the lungs, which is not favored to reflect interstitial lung disease. Rather, findings are favored to reflect an acute infectious or inflammatory response such as viral pneumonia or drug reaction. Some component of interstitial pulmonary edema is also possible, particularly in light of the cardiomegaly and bilateral pleural effusions, however, the asymmetry of the findings cannot be accounted for by pulmonary edema alone. 2. Enlargement of hypoattenuating liver lesions highly concerning for progressive disease in this patient with known history of cholangiocarcinoma. 3. Aortic atherosclerosis, in addition to left main and 3 vessel coronary artery disease. 4. Additional incidental findings, as above. Aortic Atherosclerosis (ICD10-I70.0). Electronically Signed   By: Vinnie Langton M.D.   On: 05/22/2019 08:25   VAS [place] LOWER EXTREMITY VENOUS (DVT)  Result Date: 05/21/2019  Lower Venous DVTStudy Indications: Swelling.  Risk Factors: Cancer metastatic cholangiocarcinoma. Anticoagulation: Heparin. Limitations: Left leg birth deformity. Comparison  Study: No prior exam. Performing Technologist: Baldwin Crown ARDMS, RVT  Examination Guidelines: A complete evaluation includes B-mode imaging, spectral Doppler, color Doppler, and power Doppler as needed of all accessible portions of each vessel. Bilateral testing is considered an integral part of a complete examination. Limited examinations for reoccurring indications may be performed as noted. The reflux portion of the exam is performed with the patient in reverse Trendelenburg.  +---------+---------------+---------+-----------+----------+--------------+ RIGHT    CompressibilityPhasicitySpontaneityPropertiesThrombus Aging +---------+---------------+---------+-----------+----------+--------------+ CFV      Full           Yes      Yes                                 +---------+---------------+---------+-----------+----------+--------------+ SFJ      Full                                                        +---------+---------------+---------+-----------+----------+--------------+ FV Prox  Full                                                        +---------+---------------+---------+-----------+----------+--------------+ FV Mid   Full                                                        +---------+---------------+---------+-----------+----------+--------------+ FV DistalFull                                                        +---------+---------------+---------+-----------+----------+--------------+ PFV      Full                                                        +---------+---------------+---------+-----------+----------+--------------+  POP      Full           Yes      Yes                                 +---------+---------------+---------+-----------+----------+--------------+ PTV      Full                                                        +---------+---------------+---------+-----------+----------+--------------+ PERO      Full                                                        +---------+---------------+---------+-----------+----------+--------------+   +---------+---------------+---------+-----------+----------+--------------+ LEFT     CompressibilityPhasicitySpontaneityPropertiesThrombus Aging +---------+---------------+---------+-----------+----------+--------------+ CFV      Full           Yes      Yes                                 +---------+---------------+---------+-----------+----------+--------------+ SFJ      Full                                                        +---------+---------------+---------+-----------+----------+--------------+ FV Prox  Full                                                        +---------+---------------+---------+-----------+----------+--------------+ FV Mid   Full                                                        +---------+---------------+---------+-----------+----------+--------------+ FV DistalFull                                                        +---------+---------------+---------+-----------+----------+--------------+ PFV      Full                                                        +---------+---------------+---------+-----------+----------+--------------+ POP      Full           Yes      Yes                                 +---------+---------------+---------+-----------+----------+--------------+  PTV      Full                                                        +---------+---------------+---------+-----------+----------+--------------+ PERO     Full                                                        +---------+---------------+---------+-----------+----------+--------------+     Summary: RIGHT: - There is no evidence of deep vein thrombosis in the lower extremity.  - No cystic structure found in the popliteal fossa.  LEFT: - There is no evidence of deep vein thrombosis in the  lower extremity. However, portions of this examination were limited- see technologist comments above.  - No cystic structure found in the popliteal fossa.  *See table(s) above for measurements and observations. Electronically signed by Ruta Hinds MD on 05/21/2019 at 7:05:42 PM.    Final     Cardiac Studies   Echo 05/18/19 1. Left ventricular ejection fraction, by estimation, is 60 to 65%. The  left ventricle has normal function. The left ventricle has no regional  wall motion abnormalities. Left ventricular diastolic parameters are  consistent with Grade II diastolic  dysfunction (pseudonormalization).  2. Right ventricular systolic function is mildly reduced. The right  ventricular size is normal. There is mildly elevated pulmonary artery  systolic pressure.  3. Left atrial size was moderately dilated.  4. The mitral valve is grossly normal. Mild mitral valve regurgitation.  No evidence of mitral stenosis.  5. The aortic valve is tricuspid. Aortic valve regurgitation is trivial.  No aortic stenosis is present.   Patie/nt Profile   86 y.o. female with pmh of metastatic cholangiocarcinoma, HTN, HLD, hypothyroidism, TIA, pulmonary fibrosis who is being seen for acute heart failure.   Assessment & Plan    Acute on chronic diastolic CHF - Echo this admission EF 60-65%, G2DD, mildly elevated pulmonary artery pressure - Patient is on Lasix 40 mg daily - Overall net since admission -5.7L - weight does not appear accurate - creatinine 1.64>1.58 (baseline around 1.3) - patient is euvolemic on exam  Acute Respiratory Failure - multifactorial with acute CHF and ILD - patient uses 2L O2 at baseline. Still on 5L O2 - IV steroids/Duonebs per primary team - Plan to rule out DVT/PE. Venous dopplers negative for DVT - CT chest showed possible acute infectious process and disease progression  NSVT - BB increased to Lopressor 50 mg BID - 3 beats seen overnight  Metastatic  cholangiocarcinoma - per oncology  Goals of care - palliative consult  For questions or updates, please contact [place] Please consult [email or web address] for contact info under     Signed, Cadence Ninfa Meeker, PA-C  05/23/2019, 7:38 AM    As above, patient seen and examined. She remains dyspneic but denies chest pain. We will continue low-dose Lasix. As outlined previously we are trying to run on the dry side given baseline lung disease. Predominant issue at this point appears to be interstitial lung disease. Her cholangiocarcinoma also appears to be progressing. Agree with palliative care consult. Cardiology will sign off. Please call with questions.  Kirk Ruths, MD

## 2019-05-23 NOTE — Progress Notes (Signed)
Physical Therapy Treatment Patient Details Name: Summer Nielsen MRN: [id number] DOB: 03-18-1927 Today's Date: 05/23/2019    History of Present Illness 86 year old female with history of chronic diastolic CHF, hypertension, hyperlipidemia, hypothyroidism, history of TIA, pulmonary fibrosis, metastatic cholangiocarcinoma to the liver presented to ED with shortness of breath.  Pt admitted for Acute respiratory failure ([place]) with hypoxia likely secondary to  Acute on chronic diastolic CHF (congestive heart failure) ([place]), has underlying history of pulmonary fibrosis, was being worked up for interstitial lung disease    PT Comments    Patient in [facility] chair when PT arrived. Spouse at bedside visiting. Per RN patient tends to desat in ambulation if on any less than 6 LPM, [place]. Patient did agree to ex format this evening and expressed concern and disappointment about the Palliative care consult. She was able to perform LE/UE ex format with good return demo- 2 brief rest periods. Printed ex sheet issued, left at bedside on BS table per her request. She should benefit from PT throughout hospitalization. Encouraged her to find out about the possibility of [place] even if she goes with palliative care.  Follow Up Recommendations  Supervision/Assistance - 24 hour;SNF     Equipment Recommendations  None recommended by PT    Recommendations for Other Services       Precautions / Restrictions Precautions Precautions: Fall Precaution Comments: 6 LPM, [place] Restrictions Weight Bearing Restrictions: No    Mobility  Bed Mobility Overal bed mobility: Needs Assistance(She was in BS chair when PT arrived.)             General bed mobility comments: (Did not attempt any ambulation this session, but patient agreed to EX formt)  Transfers                    Ambulation/Gait                 Stairs             Wheelchair Mobility    Modified Rankin (Stroke Patients Only)        Balance Overall balance assessment: Needs assistance(did not attempt any transfers or ambulation this session due to fatigue)                                          Cognition Arousal/Alertness: Awake/alert Behavior During Therapy: WFL for tasks assessed/performed Overall Cognitive Status: Within Functional Limits for tasks assessed                                 General Comments: Very pleasant and cooperative- spouse visiting at bedside. She agreed to ex format. RN indicated that patient fatigues very easily and seems to be dependent on 6 LPM- could not tolerate only 4 [place].      Exercises Total Joint Exercises Ankle Circles/Pumps: AROM;Seated;10 reps;Both Quad Sets: AROM;Seated;10 reps;Both Gluteal Sets: AROM;Seated;10 reps Short Arc Quad: AROM;Seated;Both;10 reps Heel Slides: AROM;Seated;Both;10 reps Hip ABduction/ADduction: AROM;Seated;Both;10 reps Other Exercises Other Exercises: Issued printed ex sheet following full instruction and good return demos- also included UE shoulder Flexion stretching and abduction.    General Comments        Pertinent Vitals/Pain Pain Assessment: No/denies pain    Home Living  Prior Function            PT Goals (current goals can now be found in the care plan section) Acute Rehab PT Goals PT Goal Formulation: With patient Time For Goal Achievement: 06/04/19 Potential to Achieve Goals: Good Progress towards PT goals: Progressing toward goals(She desats to 87% if O2 reduced- does best with 5 LPM, [place] currently)    Frequency    Min 3X/week      PT Plan      Co-evaluation              AM-PAC PT "6 Clicks" Mobility   Outcome Measure  Help needed turning from your back to your side while in a flat bed without using bedrails?: A Little Help needed moving from lying on your back to sitting on the side of a flat bed without using bedrails?: A Little Help needed  moving to and from a bed to a chair (including a wheelchair)?: A Little Help needed standing up from a chair using your arms (e.g., wheelchair or bedside chair)?: A Little Help needed to walk in hospital room?: A Little Help needed climbing 3-5 steps with a railing? : A Lot 6 Click Score: 17    End of Session   Activity Tolerance: Patient tolerated treatment well Patient left: in chair;with call bell/phone within reach;with chair alarm set   PT Visit Diagnosis: Other abnormalities of gait and mobility (R26.89);Muscle weakness (generalized) (M62.81)     Time: 3888-7579 PT Time Calculation (min) (ACUTE ONLY): 35 min  Charges:  $Therapeutic Exercise: 23-37 mins                    Rollen Sox, PT # [phone] CGV cell   Casandra Doffing 05/23/2019, 4:50 PM

## 2019-05-23 NOTE — Consult Note (Signed)
Consultation Note Date: 05/23/2019   Patient Name: Summer Nielsen  DOB: [birth year]  MRN: [id number]  Age / Sex: 86 y.o., female  PCP: Maurice Small, MD Referring Physician: Domenic Polite, MD  Reason for Consultation: Establishing goals of care  HPI/Patient Profile: 86 y.o. female  admitted on 05/17/2019    Clinical Assessment and Goals of Care: Summer Nielsen is 87 years old.  She lives at home with her husband.  She has 2 sons who live locally.  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  CT scan additionally suggestive of diffuse lung infiltrates pneumonitis and underlying pulmonary fibrosis.  Patient remains admitted to hospital medicine service.  She has been seen by medical oncology, cardiology and pulmonary services.  It has been recommended that the patient consider home with hospice care.  She is accepting of this.  Palliative consult for additional support has been requested.  Summer Nielsen is awake alert resting in bed.  I introduced myself and palliative care as follows: Palliative medicine is specialized medical care for people living with serious illness. It focuses on providing relief from the symptoms and stress of a serious illness. The goal is to improve quality of life for both the patient and the family.  Goals of care: Broad aims of medical therapy in relation to the patient's values and preferences. Our aim is to provide medical care aimed at enabling patients to achieve the goals that matter most to them, given the circumstances of their particular medical situation and their constraints.   Summer Nielsen ate about 20 to 30% of her breakfast.  We discussed about her current life limiting illness and most recent scans.  I gave her some information about home with hospice support.  Differences between home with hospice versus residential hospice were  explored.  Brief life review performed.  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  Her sister also had pulmonary fibrosis and died of pulmonary complications.  She has been married to her husband for 55 years.  Offered active listening supportive care and compassionate presence and acknowledged the serious and irreversible nature of her illness.  We discussed about aggressive symptom management comfort measures and having hospice available as a resource.  She is in agreement.  NEXT OF KIN Lives at home with husband.  Has 2 sons.  SUMMARY OF RECOMMENDATIONS   Home with hospice upon discharge.  Full code for now, discussed with patient about DO NOT RESUSCITATE, she has advanced directives paperwork at home that she will go home and review.  Offered active listening supportive care and compassionate presence as Summer Nielsen navigates this phase of her serious incurable illness and gets ready for hospice. Thank you for the consult. Code Status/Advance Care Planning:  Full code    Symptom Management:   As above  Palliative Prophylaxis:   Delirium Protocol   Psycho-social/Spiritual:   Desire for further Chaplaincy support:yes  Additional Recommendations: Education on Hospice  Prognosis:   <  6 months  Discharge Planning: Home with Hospice      Primary Diagnoses: Present on Admission: . Acute respiratory failure ([place]) . Acute on chronic diastolic CHF (congestive heart failure) ([place]) . 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 Hyperlipidemia . Hypertensive heart disease with heart failure ([place]) . TIA (transient ischemic attack) . Postsurgical hypothyroidism   I have reviewed the medical record, interviewed the patient and family, and examined the patient. The following aspects are pertinent.  Past Medical History:  Diagnosis Date  . Cancer ([place])    squamous cell skin cancer on left leg-removed  . CHF (congestive heart  failure) ([place])   . Cholangiocarcinoma ([place]) dx'd 11/2017  . Complication of anesthesia   . GERD (gastroesophageal reflux disease)   . Glaucoma   . High blood pressure   . Hyperlipidemia   . Hypothyroidism   . OP (osteoporosis)   . PONV (postoperative nausea and vomiting)   . Postsurgical hypothyroidism   . Pulmonary fibrosis ([place])   . 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 Tachycardia   . TIA (transient ischemic attack)    Social History   Socioeconomic History  . Marital status: Married    Spouse name: Not on file  . Number of children: Not on file  . Years of education: Not on file  . Highest education level: Not on file  Occupational History  . Not on file  Tobacco Use  . Smoking status: Never Smoker  . Smokeless tobacco: Never Used  Substance and Sexual Activity  . Alcohol use: No  . Drug use: No  . Sexual activity: Not on file  Other Topics Concern  . Not on file  Social History Narrative  . Not on file   Social Determinants of Health   Financial Resource Strain:   . Difficulty of Paying Living Expenses:   Food Insecurity:   . Worried About Charity fundraiser in the Last Year:   . Arboriculturist in the Last Year:   Transportation Needs:   . Film/video editor (Medical):   Marland Kitchen Lack of Transportation (Non-Medical):   Physical Activity:   . Days of Exercise per Week:   . Minutes of Exercise per Session:   Stress:   . Feeling of Stress :   Social Connections:   . Frequency of Communication with Friends and Family:   . Frequency of Social Gatherings with Friends and Family:   . Attends Religious Services:   . Active Member of Clubs or Organizations:   . Attends Archivist Meetings:   Marland Kitchen Marital Status:    Family History  Problem Relation Age of Onset  . Heart attack Mother   . Cerebral aneurysm Mother   . Stroke Mother   . Hypertension Mother   . Heart attack Father   . Heart disease Sister   . Heart  disease Brother    Scheduled Meds: . sodium chloride   Intravenous Once  . amLODipine  5 mg Oral Daily  . atorvastatin  20 mg Oral Daily  . benzonatate  100 mg Oral TID  . calcium-vitamin D  1 tablet Oral Daily  . chlorhexidine  15 mL Mouth Rinse BID  . Chlorhexidine Gluconate Cloth  6 each Topical Daily  . dicyclomine  20 mg Oral TID  . fluticasone furoate-vilanterol  1 puff Inhalation Daily  . furosemide  40 mg Oral Daily  . gabapentin  300 mg Oral BID  . heparin  5,000 Units Subcutaneous Q8H  . ipratropium-albuterol  3 mL Nebulization BID  . latanoprost  1 drop Both Eyes QHS  . levothyroxine  150 mcg Oral QAC breakfast  . mouth rinse  15 mL Mouth Rinse q12n4p  . methylPREDNISolone (SOLU-MEDROL) injection  40 mg Intravenous Q12H  . metoprolol tartrate  50 mg Oral BID  . pantoprazole  40 mg Oral Daily  . sodium chloride flush  10-40 mL Intracatheter Q12H  . sodium chloride flush  3 mL Intravenous Q12H   Continuous Infusions: . sodium chloride     PRN Meds:.sodium chloride, acetaminophen, albuterol, guaiFENesin-dextromethorphan, hydrALAZINE, loperamide, ondansetron (ZOFRAN) IV, sodium chloride flush, sodium chloride flush Medications Prior to Admission:  Prior to Admission medications   Medication Sig Start Date End Date Taking? Authorizing Provider  acetaminophen (TYLENOL) 500 MG tablet Take 1,000 mg by mouth daily as needed for moderate pain or headache.   Yes [provider]  amLODipine (NORVASC) 5 MG tablet Take 5 mg by mouth daily.   Yes [provider]  aspirin 81 MG chewable tablet Chew 81 mg by mouth daily.   Yes [provider]  atorvastatin (LIPITOR) 20 MG tablet Take 20 mg by mouth daily. 03/05/19  Yes [provider]  bimatoprost (LUMIGAN) 0.01 % SOLN Place 1 drop into both eyes at bedtime.    Yes [provider]  calcium-vitamin D (OSCAL WITH D) 500-200 MG-UNIT tablet Take 1 tablet by mouth daily.    Yes [provider]  fluticasone furoate-vilanterol (BREO ELLIPTA) 100-25 MCG/INH AEPB Inhale 1 puff into the lungs daily. 05/02/19  Yes Mannam, Praveen, MD  furosemide (LASIX) 40 MG tablet Take 40-60 mg by mouth See admin instructions. Take 60 mg in the AM and 40 mg after lunch   Yes [provider]  gabapentin (NEURONTIN) 300 MG capsule TAKE 1 CAPSULE BY MOUTH THREE TIMES A DAY Patient taking differently: Take 300 mg by mouth 3 (three) times daily.  12/12/17  Yes Magnus Sinning, MD  ibuprofen (ADVIL,MOTRIN) 200 MG tablet Take 200 mg by mouth every 6 (six) hours as needed for moderate pain (back surgery).   Yes [provider]  Iron-FA-B Cmp-C-Biot-Probiotic (FUSION PLUS) CAPS Take 1 capsule by mouth daily. 04/14/17  Yes Jessy Oto, MD  levothyroxine (SYNTHROID, LEVOTHROID) 150 MCG tablet Take 150 mcg by mouth daily. 06/01/18  Yes [provider]  loperamide (IMODIUM) 2 MG capsule Take 2 capsules by mouth 4 (four) times daily as needed.   Yes [provider]  losartan (COZAAR) 50 MG tablet Take 50 mg by mouth daily.   Yes [provider]  omeprazole (PRILOSEC) 40 MG capsule Take 40 mg by mouth daily before breakfast.  10/04/18  Yes [provider]  propranolol (INDERAL) 20 MG tablet Take 20 mg by mouth 2 (two) times daily.    Yes [provider]  albuterol (VENTOLIN HFA) 108 (90 Base) MCG/ACT inhaler Inhale 2 puffs into the lungs every 6 (six) hours as needed for wheezing or shortness of breath. Patient not taking: Reported on 05/17/2019 05/16/19   Martyn Ehrich, NP  dicyclomine (BENTYL) 20 MG tablet Take 20 mg by mouth 3 (three) times daily. 04/12/19   [provider]  HYDROmorphone (DILAUDID) 2 MG tablet Take 0.5-1 tablets (1-2 mg total) by mouth every 4 (four) hours as needed for severe pain. Patient not taking: Reported on 05/17/2019 12/08/18   Ladell Pier, MD  KLOR-CON M10 10 MEQ tablet TAKE 2 TABLETS  EVERY DAY Patient not  taking: No sig reported 03/05/19   Ladell Pier, MD  lidocaine-prilocaine (EMLA) cream Apply 1 application topically as directed. Patient not taking: Reported on 05/17/2019 09/01/18   Ladell Pier, MD  predniSONE (DELTASONE) 10 MG tablet Take 2 tabs x 5 days 05/17/19   Martyn Ehrich, NP   Allergies  Allergen Reactions  . Levofloxacin Nausea Only    Dizzy, hot, foot pain, insomnia  . Percocet [Oxycodone-Acetaminophen] Nausea And Vomiting and Other (See Comments)    sick   Review of Systems Denies pain denies shortness of breath Physical Exam Awake alert resting in bed Bibasilar rales S1-S2 No lower extremity edema Abdomen is nontender nondistended Awake alert oriented No focal deficits  Vital Signs: BP (!) 169/70 (BP Location: Right Arm)   Pulse 71   Temp 97.8 F (36.6 C) (Oral)   Resp 16   Ht _0  (1.575 m)   Wt 61.5 kg   LMP  (LMP Unknown)   SpO2 93%   BMI 24.80 kg/m  Pain Scale: 0-10   Pain Score: 0-No pain   SpO2: SpO2: 93 % O2 Device:SpO2: 93 % O2 Flow Rate: .O2 Flow Rate (L/min): 5 L/min  IO: Intake/output summary:   Intake/Output Summary (Last 24 hours) at 05/23/2019 1421 Last data filed at 05/23/2019 0509 Gross per 24 hour  Intake 363 ml  Output 300 ml  Net 63 ml    LBM: Last BM Date: 05/22/19 Baseline Weight: Weight: 61.1 kg Most recent weight: Weight: 61.5 kg     Palliative Assessment/Data:   Palliative performance scale 50%.  Time In:  12Time Out:  1300 Time Total:   60 Greater than 50%  of this time was spent counseling and coordinating care related to the above assessment and plan.  Signed by: Loistine Chance, MD   Please contact Palliative Medicine Team phone at [phone] for questions and concerns.  For individual provider: See Shea Evans

## 2019-05-24 LAB — CBC
HCT: 28.8 % — ABNORMAL LOW (ref 36.0–46.0)
Hemoglobin: 8.7 g/dL — ABNORMAL LOW (ref 12.0–15.0)
MCH: 30.2 pg (ref 26.0–34.0)
MCHC: 30.2 g/dL (ref 30.0–36.0)
MCV: 100 fL (ref 80.0–100.0)
Platelets: 284 10*3/uL (ref 150–400)
RBC: 2.88 MIL/uL — ABNORMAL LOW (ref 3.87–5.11)
RDW: 17.2 % — ABNORMAL HIGH (ref 11.5–15.5)
WBC: 10.2 10*3/uL (ref 4.0–10.5)
nRBC: 0.2 % (ref 0.0–0.2)

## 2019-05-24 MED ORDER — PREDNISONE 10 MG PO TABS: 10.0000 mg | ORAL_TABLET | Freq: Every day | ORAL | 0 refills | Status: DC

## 2019-05-24 MED ORDER — HEPARIN SOD (PORK) LOCK FLUSH 100 UNIT/ML IV SOLN
500.0000 [IU] | INTRAVENOUS | Status: AC | PRN
  Administered 2019-05-24: 13:00:00 500 [IU]
  Filled 2019-05-24: qty 5

## 2019-05-24 MED ORDER — PREDNISONE 20 MG PO TABS
40.0000 mg | ORAL_TABLET | Freq: Every day | ORAL | Status: DC
  Administered 2019-05-24: 40 mg via ORAL
  Filled 2019-05-24: qty 2

## 2019-05-24 NOTE — TOC Progression Note (Signed)
Transition of Care [facility]) - Progression Note    Patient Details  Name: Summer Nielsen MRN: [id number] Date of Birth: [birth year]  [facility]) CM/SW Contact  Purcell Mouton, RN Phone Number: 05/24/2019, 12:30 PM  Clinical Narrative:     PTAR called for 3 PM transportation to home. Son Elta Guadeloupe was made aware and RN.    Expected Discharge Plan: [place] Barriers to Discharge: Continued Medical Work up  Expected Discharge Plan and Services Expected Discharge Plan: [place] In-house Referral: Clinical Social Work Discharge Planning Services: Mobile Meals   Living arrangements for the past 2 months: Apartment Expected Discharge Date: 05/24/19                                     Social Determinants of Health (SDOH) Interventions    Readmission Risk Interventions Readmission Risk Prevention Plan 05/21/2019 05/21/2019  Transportation Screening - Complete  PCP or Specialist Appt within 3-5 Days - Complete  HRI or [place] - Complete  Social Work Consult for [place] Planning/Counseling - Complete  Palliative Care Screening - Not Applicable  Medication Review Press photographer) Complete Referral to Pharmacy  Some recent data might be hidden

## 2019-05-24 NOTE — Care Management Important Message (Signed)
Important Message  Patient Details IM Letter given to Gabriel Earing RN Case Manager to present to the Patient Name: Summer Nielsen MRN: [id number] Date of Birth: October 24, 1927   Medicare Important Message Given:  Yes     Kerin Salen 05/24/2019, 10:06 AM

## 2019-05-24 NOTE — Progress Notes (Signed)
Hydrologist [facility]) Hospital Liaison: RN note     Notified by Transition of Care Manger of patient/family request for [facility] services at home after discharge. Chart and patient information under review by [facility] physician. Hospice eligibility pending currently.     Writer spoke with son Elta Guadeloupe  to initiate education related to hospice philosophy, services and team approach to care.  Elta Guadeloupe  verbalized understanding of information given. Per discussion, plan is for discharge to home by PTAR.   Patient will need prescriptions for discharge comfort medications.      DME needs have been discussed, patient currently has the following equipment in the home: Oxygen, walker, W/C.   Patient/family requests the following DME for delivery to the home:   Oxygen, 3N1 . [place] equipment manager has been notified and will contact DME provider to arrange delivery to the home. Home address has been verified and is correct in the chart.  Elta Guadeloupe  is the family member to contact to arrange time of delivery.      [facility] Referral Center aware of the above. Please notify [facility] when patient is ready to leave the unit at discharge. (Call [phone] or [phone] after 5pm.) [facility] information and contact numbers given to  [facility].       Please call with any hospice related questions.      Thank you for this referral.     Farrel Gordon, RN, [facility] (listed on AMION under Hospice and [place] [facility] [place])   [phone]

## 2019-05-24 NOTE — TOC Progression Note (Signed)
Transition of Care [facility]) - Progression Note    Patient Details  Name: Summer Nielsen MRN: [id number] Date of Birth: March 01, 1928  [facility]) CM/SW Contact  Purcell Mouton, RN Phone Number: 05/24/2019, 10:35 AM  Clinical Narrative:    Family selected [place].    Expected Discharge Plan: [place] Barriers to Discharge: Continued Medical Work up  Expected Discharge Plan and Services Expected Discharge Plan: [place] In-house Referral: Clinical Social Work Discharge Planning Services: Mobile Meals   Living arrangements for the past 2 months: Apartment                                       Social Determinants of Health (SDOH) Interventions    Readmission Risk Interventions Readmission Risk Prevention Plan 05/21/2019 05/21/2019  Transportation Screening - Complete  PCP or Specialist Appt within 3-5 Days - Complete  HRI or [place] - Complete  Social Work Consult for [place] Planning/Counseling - Complete  Palliative Care Screening - Not Applicable  Medication Review Press photographer) Complete Referral to Pharmacy  Some recent data might be hidden

## 2019-05-24 NOTE — Progress Notes (Signed)
HEMATOLOGY-ONCOLOGY PROGRESS NOTE  SUBJECTIVE: Summer Nielsen continues to have dyspnea with minimal exertion.  No other complaint.  Her son is in the room this morning.  Oncology History  Cholangiocarcinoma ([place])  10/13/2017 Initial Diagnosis   Cholangiocarcinoma ([place])   10/13/2017 Cancer Staging   Staging form: Intrahepatic Bile Duct, AJCC 8th Edition - Clinical: Stage IA (cT1a, cN0, cM0) - Signed by Kyung Rudd, MD on 10/13/2017   09/12/2018 -  Chemotherapy   The patient had PACLitaxel-protein bound (ABRAXANE) chemo infusion 175 mg, 100 mg/m2 = 175 mg (100 % of original dose 100 mg/m2), Intravenous,  Once, 7 of 8 cycles Dose modification: 100 mg/m2 (original dose 100 mg/m2, Cycle 1, Reason: Provider Judgment) Administration: 175 mg (09/12/2018), 175 mg (09/27/2018), 175 mg (10/10/2018), 175 mg (10/24/2018), 175 mg (11/07/2018), 175 mg (12/05/2018), 175 mg (12/19/2018), 175 mg (01/02/2019), 175 mg (01/16/2019), 175 mg (02/06/2019), 175 mg (02/20/2019), 175 mg (03/19/2019), 175 mg (04/11/2019) gemcitabine (GEMZAR) 1,330 mg in sodium chloride 0.9 % 250 mL chemo infusion, 800 mg/m2 = 1,330 mg (100 % of original dose 800 mg/m2), Intravenous,  Once, 7 of 8 cycles Dose modification: 800 mg/m2 (original dose 800 mg/m2, Cycle 1, Reason: Provider Judgment) Administration: 1,330 mg (09/12/2018), 1,330 mg (09/27/2018), 1,330 mg (10/10/2018), 1,330 mg (10/24/2018), 1,330 mg (11/07/2018), 1,330 mg (12/05/2018), 1,330 mg (12/19/2018), 1,330 mg (01/02/2019), 1,330 mg (01/16/2019), 1,330 mg (02/06/2019), 1,330 mg (02/20/2019), 1,330 mg (03/19/2019), 1,330 mg (04/11/2019)  for chemotherapy treatment.     PHYSICAL EXAMINATION:  Vitals:   05/24/19 0547 05/24/19 0907  BP: (!) 167/68   Pulse: 66   Resp: 18   Temp: (!) 97.5 F (36.4 C)   SpO2:  91%   Filed Weights   05/20/19 0654 05/21/19 0459 05/23/19 0500  Weight: 131 lb 9.8 oz (59.7 kg) 130 lb 11.2 oz (59.3 kg) 135 lb 9.3 oz (61.5 kg)    Intake/Output from previous day: 03/17 0701  - 03/18 0700 In: 290 [P.O.:180; I.V.:110] Out: 650 [Urine:650] Physical examination-not performed today  LABORATORY DATA:  I have reviewed the data as listed CMP Latest Ref Rng & Units 05/23/2019 05/22/2019 05/20/2019  Glucose 70 - 99 mg/dL 146(H) 136(H) 142(H)  BUN 8 - 23 mg/dL 68(H) 59(H) 54(H)  Creatinine 0.44 - 1.00 mg/dL 1.58(H) 1.64(H) 1.68(H)  Sodium 135 - 145 mmol/L 140 140 135  Potassium 3.5 - 5.1 mmol/L 4.1 4.2 3.4(L)  Chloride 98 - 111 mmol/L 105 105 98  CO2 22 - 32 mmol/L _0 Calcium 8.9 - 10.3 mg/dL 8.3(L) 8.2(L) 8.1(L)  Total Protein 6.5 - 8.1 g/dL - - -  Total Bilirubin 0.3 - 1.2 mg/dL - - -  Alkaline Phos 38 - 126 U/L - - -  AST 15 - 41 U/L - - -  ALT 0 - 44 U/L - - -    Lab Results  Component Value Date   WBC 10.2 05/24/2019   HGB 8.7 (L) 05/24/2019   HCT 28.8 (L) 05/24/2019   MCV 100.0 05/24/2019   PLT 284 05/24/2019   NEUTROABS 6.7 05/08/2019    DG Chest 2 View  Result Date: 05/21/2019 CLINICAL DATA:  Shortness of breath. EXAM: CHEST - 2 VIEW COMPARISON:  May 17, 2019. FINDINGS: Stable cardiomediastinal silhouette. Atherosclerosis of thoracic aorta is noted. Right internal jugular Port-A-Cath is unchanged in position. Stable bilateral lung opacities are noted, right greater than left, concerning for edema or possibly inflammation. No pneumothorax is noted. Small left pleural effusion is noted. Bony thorax is  unremarkable. IMPRESSION: Stable bilateral lung opacities are noted, right greater than left, concerning for edema or possibly inflammation. Small left pleural effusion. Aortic Atherosclerosis (ICD10-I70.0). Electronically Signed   By: Marijo Conception M.D.   On: 05/21/2019 11:30   CT Chest High Resolution  Result Date: 05/22/2019 CLINICAL DATA:  86 year old female with history of hypoxemia. EXAM: CT CHEST WITHOUT CONTRAST TECHNIQUE: Multidetector CT imaging of the chest was performed following the standard protocol without intravenous contrast. High  resolution imaging of the lungs, as well as inspiratory and expiratory imaging, was performed. COMPARISON:  Chest CT 02/16/2019. FINDINGS: Cardiovascular: Heart size is mildly enlarged with left atrial dilatation. There is no significant pericardial fluid, thickening or pericardial calcification. There is aortic atherosclerosis, as well as atherosclerosis of the great vessels of the mediastinum and the coronary arteries, including calcified atherosclerotic plaque in the left main, left anterior descending, left circumflex and right coronary arteries. Mediastinum/Nodes: No pathologically enlarged mediastinal or hilar lymph nodes. Esophagus is unremarkable in appearance. No axillary lymphadenopathy. Lungs/Pleura: High-resolution images demonstrate asymmetrically distributed patchy areas of ground-glass attenuation, septal thickening, thickening of the peribronchovascular interstitium, mild cylindrical bronchiectasis and regional architectural distortion. These findings have no craniocaudal gradient, and are more severe in the right lung than the left. Small bilateral pleural effusions lying dependently. Upper Abdomen: Hypoattenuating lesions in the liver, incompletely characterized on today's non-contrast CT examination, but grossly enlarged compared to the prior examination from 02/16/2019. Specifically, the largest lesion located predominantly in segments 4A & 4B has dramatically increased in size and currently measures at least 6.6 x 5.9 cm (axial image 129 of series 2) as compared with 2.9 x 2.4 cm on the prior study. Atrophy of the left kidney. Incompletely imaged low-attenuation lesion in the lateral aspect of the upper pole the right kidney, previously characterized as a simple cyst. Aortic atherosclerosis. Status post cholecystectomy. Musculoskeletal: Chronic appearing compression fracture of T12 with 40% loss of anterior vertebral body height, unchanged. There are no aggressive appearing lytic or blastic  lesions noted in the visualized portions of the skeleton. IMPRESSION: 1. Unusual appearance of the lungs, which is not favored to reflect interstitial lung disease. Rather, findings are favored to reflect an acute infectious or inflammatory response such as viral pneumonia or drug reaction. Some component of interstitial pulmonary edema is also possible, particularly in light of the cardiomegaly and bilateral pleural effusions, however, the asymmetry of the findings cannot be accounted for by pulmonary edema alone. 2. Enlargement of hypoattenuating liver lesions highly concerning for progressive disease in this patient with known history of cholangiocarcinoma. 3. Aortic atherosclerosis, in addition to left main and 3 vessel coronary artery disease. 4. Additional incidental findings, as above. Aortic Atherosclerosis (ICD10-I70.0). Electronically Signed   By: Vinnie Langton M.D.   On: 05/22/2019 08:25   DG Chest Port 1 View  Result Date: 05/17/2019 CLINICAL DATA:  Shortness of breath EXAM: PORTABLE CHEST 1 VIEW COMPARISON:  03/13/2019 FINDINGS: Cardiac shadow is enlarged but stable. Aortic calcifications are seen. Right chest wall port is noted. Increasing vascular congestion is noted with parenchymal edema worse on the right than the left. No bony abnormality is seen. IMPRESSION: Increasing vascular congestion and parenchymal edema. Electronically Signed   By: Inez Catalina M.D.   On: 05/17/2019 15:43   ECHOCARDIOGRAM COMPLETE  Result Date: 05/18/2019    ECHOCARDIOGRAM REPORT   Patient Name:   Summer Nielsen Date of Exam: 05/18/2019 Medical Rec #:  [id number]         Height:  62.0 in Accession #:    [id number]        Weight:       134.5 lb Date of Birth:  Jul 15, 1927         BSA:          1.615 m Patient Age:    33 years          BP:           154/69 mmHg Patient Gender: F                 HR:           74 bpm. Exam Location:  Inpatient Procedure: 2D Echo, Cardiac Doppler and Color Doppler Indications:     I50.23 Acute on chronic systolic (congestive) heart failure  History:        Patient has prior history of Echocardiogram examinations, most                 recent 10/19/2018. TIA; Risk Factors:Hypertension and                 Dyslipidemia. Cancer. GERD. Hypothyroidism.  Sonographer:    Jonelle Sidle Dance Referring Phys: [id number] RIPUDEEP K RAI IMPRESSIONS  1. Left ventricular ejection fraction, by estimation, is 60 to 65%. The left ventricle has normal function. The left ventricle has no regional wall motion abnormalities. Left ventricular diastolic parameters are consistent with Grade II diastolic dysfunction (pseudonormalization).  2. Right ventricular systolic function is mildly reduced. The right ventricular size is normal. There is mildly elevated pulmonary artery systolic pressure.  3. Left atrial size was moderately dilated.  4. The mitral valve is grossly normal. Mild mitral valve regurgitation. No evidence of mitral stenosis.  5. The aortic valve is tricuspid. Aortic valve regurgitation is trivial. No aortic stenosis is present. FINDINGS  Left Ventricle: Left ventricular ejection fraction, by estimation, is 60 to 65%. The left ventricle has normal function. The left ventricle has no regional wall motion abnormalities. The left ventricular internal cavity size was normal in size. There is  no left ventricular hypertrophy. Left ventricular diastolic parameters are consistent with Grade II diastolic dysfunction (pseudonormalization). Right Ventricle: The right ventricular size is normal. No increase in right ventricular wall thickness. Right ventricular systolic function is mildly reduced. There is mildly elevated pulmonary artery systolic pressure. The tricuspid regurgitant velocity  is 2.79 m/s, and with an assumed right atrial pressure of 3 mmHg, the estimated right ventricular systolic pressure is 50.2 mmHg. Left Atrium: Left atrial size was moderately dilated. Right Atrium: Right atrial size was normal in size.  Pericardium: There is no evidence of pericardial effusion. Mitral Valve: The mitral valve is grossly normal. Moderate mitral annular calcification. Mild mitral valve regurgitation. No evidence of mitral valve stenosis. Tricuspid Valve: The tricuspid valve is normal in structure. Tricuspid valve regurgitation is mild . No evidence of tricuspid stenosis. Aortic Valve: The aortic valve is tricuspid. Aortic valve regurgitation is trivial. Aortic regurgitation PHT measures 454 msec. No aortic stenosis is present. Pulmonic Valve: The pulmonic valve was normal in structure. Pulmonic valve regurgitation is not visualized. No evidence of pulmonic stenosis. Aorta: The aortic root and ascending aorta are structurally normal, with no evidence of dilitation. IAS/Shunts: The atrial septum is grossly normal.  LEFT VENTRICLE PLAX 2D LVIDd:         4.70 cm  Diastology LVIDs:         3.20 cm  LV e' lateral:   8.05 cm/s LV PW:  1.10 cm  LV E/e' lateral: 14.4 LV IVS:        1.00 cm  LV e' medial:    4.79 cm/s LVOT diam:     2.10 cm  LV E/e' medial:  24.2 LV SV:         70 LV SV Index:   43 LVOT Area:     3.46 cm  RIGHT VENTRICLE             IVC RV Basal diam:  2.10 cm     IVC diam: 1.70 cm RV S prime:     10.70 cm/s TAPSE (M-mode): 1.7 cm LEFT ATRIUM             Index       RIGHT ATRIUM          Index LA diam:        4.30 cm 2.66 cm/m  RA Area:     8.81 cm LA Vol (A2C):   67.1 ml 41.55 ml/m RA Volume:   12.50 ml 7.74 ml/m LA Vol (A4C):   70.9 ml 43.90 ml/m LA Biplane Vol: 68.8 ml 42.60 ml/m  AORTIC VALVE LVOT Vmax:   82.50 cm/s LVOT Vmean:  53.800 cm/s LVOT VTI:    0.202 m AI PHT:      454 msec  AORTA Ao Root diam: 3.20 cm Ao Asc diam:  2.80 cm MITRAL VALVE                TRICUSPID VALVE MV Area (PHT): 3.77 cm     TR Peak grad:   31.1 mmHg MV Decel Time: 201 msec     TR Vmax:        279.00 cm/s MV E velocity: 116.00 cm/s MV A velocity: 81.90 cm/s   SHUNTS MV E/A ratio:  1.42         Systemic VTI:  0.20 m                              Systemic Diam: 2.10 cm Mertie Moores MD Electronically signed by Mertie Moores MD Signature Date/Time: 05/18/2019/11:20:46 AM    Final    VAS [place] LOWER EXTREMITY VENOUS (DVT)  Result Date: 05/21/2019  Lower Venous DVTStudy Indications: Swelling.  Risk Factors: Cancer metastatic cholangiocarcinoma. Anticoagulation: Heparin. Limitations: Left leg birth deformity. Comparison Study: No prior exam. Performing Technologist: Baldwin Crown ARDMS, RVT  Examination Guidelines: A complete evaluation includes B-mode imaging, spectral Doppler, color Doppler, and power Doppler as needed of all accessible portions of each vessel. Bilateral testing is considered an integral part of a complete examination. Limited examinations for reoccurring indications may be performed as noted. The reflux portion of the exam is performed with the patient in reverse Trendelenburg.  +---------+---------------+---------+-----------+----------+--------------+ RIGHT    CompressibilityPhasicitySpontaneityPropertiesThrombus Aging +---------+---------------+---------+-----------+----------+--------------+ CFV      Full           Yes      Yes                                 +---------+---------------+---------+-----------+----------+--------------+ SFJ      Full                                                        +---------+---------------+---------+-----------+----------+--------------+  FV Prox  Full                                                        +---------+---------------+---------+-----------+----------+--------------+ FV Mid   Full                                                        +---------+---------------+---------+-----------+----------+--------------+ FV DistalFull                                                        +---------+---------------+---------+-----------+----------+--------------+ PFV      Full                                                         +---------+---------------+---------+-----------+----------+--------------+ POP      Full           Yes      Yes                                 +---------+---------------+---------+-----------+----------+--------------+ PTV      Full                                                        +---------+---------------+---------+-----------+----------+--------------+ PERO     Full                                                        +---------+---------------+---------+-----------+----------+--------------+   +---------+---------------+---------+-----------+----------+--------------+ LEFT     CompressibilityPhasicitySpontaneityPropertiesThrombus Aging +---------+---------------+---------+-----------+----------+--------------+ CFV      Full           Yes      Yes                                 +---------+---------------+---------+-----------+----------+--------------+ SFJ      Full                                                        +---------+---------------+---------+-----------+----------+--------------+ FV Prox  Full                                                        +---------+---------------+---------+-----------+----------+--------------+  FV Mid   Full                                                        +---------+---------------+---------+-----------+----------+--------------+ FV DistalFull                                                        +---------+---------------+---------+-----------+----------+--------------+ PFV      Full                                                        +---------+---------------+---------+-----------+----------+--------------+ POP      Full           Yes      Yes                                 +---------+---------------+---------+-----------+----------+--------------+ PTV      Full                                                         +---------+---------------+---------+-----------+----------+--------------+ PERO     Full                                                        +---------+---------------+---------+-----------+----------+--------------+     Summary: RIGHT: - There is no evidence of deep vein thrombosis in the lower extremity.  - No cystic structure found in the popliteal fossa.  LEFT: - There is no evidence of deep vein thrombosis in the lower extremity. However, portions of this examination were limited- see technologist comments above.  - No cystic structure found in the popliteal fossa.  *See table(s) above for measurements and observations. Electronically signed by Ruta Hinds MD on 05/21/2019 at 7:05:42 PM.    Final     ASSESSMENT AND PLAN: 1. Anterior liver mass, ultrasound-guided biopsy 09/22/2017-adenocarcinoma, MSI-stable, tumor mutation burden 0,IDH1 R132G, BAP1 loss ? CT chest 09/07/2017, 4.1 cm hypodense anterior liver mass, changes of chronic hypersensitivity pneumonitis ? MRI abdomen 09/13/2017-isolated liver mass measuring 4.7 x 3.8 cm involving segments 4B and 5, no additional liver masses ? SBRT to the liver mass beginning 10/31/2017, completed 11/10/2017 ? CT abdomen/pelvis 01/23/2018-radiation changes in the liver, liver mass measuring 5.9 x 4.4 x 5.0 cm but no evidence of metastatic disease in the abdomen or pelvis ? MRI abdomen 02/22/2018-dominant central left liver lesion slightly increased in size with decreased central enhancement suggesting necrosis, 2 new lesions including an 11 mm left hepatic lesion and a 9 mm right hepatic lesion ? Xeloda 7 days on/7 days off beginning 03/27/2018 (1500 mg every morning and 1000 mg every  afternoon) ? Xeloda dose reduced to 1000 mg every morning and 1000 mg every afternoon beginning 04/24/2018 due to diarrhea ? Xeloda discontinued after an office visit 05/15/2018 secondary to hand/foot syndrome and diarrhea ? CT abdomen/pelvis 06/12/2018-decrease in size of  dominant left hepatic mass, no significant change in smaller liver lesions, stable biliary ductal dilatation, diffuse "colitis " ? CT 08/31/2018-decreased size of the segment 5 lesion, increased size of other liver lesions and new lesions, no evidence of extrahepatic metastatic disease, persistent colonic thickening ? Cycle 1 gemcitabine/Abraxane 09/12/2018 ? Cycle 2 gemcitabine/Abraxane 09/27/2018 ? Cycle 3 gemcitabine/Abraxane 10/10/2018 ? Cycle 4 gemcitabine/Abraxane 10/24/2018 ? Cycle 5 gemcitabine/Abraxane 11/07/2018 ? CT 11/20/2018, unchanged dominant segment 5 lesion, interval enlargement of local small hypodense lesions in the right liver, no evidence of distant metastatic disease, colonic wall thickening-resolved ? CTs reviewed by Dr. Lolitha Tortora/radiologist, consistent with stable disease ? Cycle 6 gemcitabine/Abraxane 12/05/2018 ? Cycle 7 gemcitabine/Abraxane 12/19/2018 ? Cycle 8 gemcitabine/Abraxane 01/02/2019 ? Cycle 9 gemcitabine/Abraxane 01/16/2019 ? Cycle 10 gemcitabine/Abraxane 02/06/2019 ? CTs 02/16/2019-stable liver lesions. Chronic changes of interstitial lung disease, appears mildly progressive. New trace bilateral pleural effusions and/or areas of pleural thickening. ? Cycle 11 gemcitabine/Abraxane 02/20/2019 (schedule changedto every 3 weeks going forward) ? Cycle 12 gemcitabine/Abraxane 03/19/2019 ? Cycle 13 gemcitabine/Abraxane 04/11/2019 ? CT chest 05/21/2019-enlarging liver metastases  2. Abdominal pain 3. History of diastolic heart failure 4. Hypertension 5. G3, P3 6. History of anemia 7. Right foot cellulitis 05/22/2018-Keflex prescribed 05/23/2018 C. difficile colitis 06/14/2018-treated with vancomycin, recurrent diarrhea 07/04/2018-second course of vancomycin (prolonged taper) prescribed, persistent diarrhea 08/06/2018-improved after diet change 8. Hospital admission 03/13/2019-partial expressive aphasia and dehydration; negative MRI of the brain 03/14/2019.  Mental status  improved. 9.  Interstitial lung disease, admission 05/17/2019 with respiratory failure, CT chest 05/21/2019 with progressive bilateral infiltrates-pneumonitis versus infection  Summer Nielsen appears stable.  She continues to require high flow nasal cannula oxygen to maintain adequate saturation.  It is unclear whether the respiratory failure is due to progression of underlying lung disease or chemotherapy-induced pneumonitis.  She is maintained on steroids at present. I discussed disposition plans with Summer Nielsen and her son.  We discussed the CT evidence of progressive cholangiocarcinoma.  I recommend hospice care.  Summer Nielsen is in agreement.  She is currently living independently with her husband.  She and her son acknowledge the need to begin investigating other options.  She is followed by Dr. Justin Mend for primary care.  She would like Dr. Justin Mend to serve as the primary provider with hospice.  Recommendations: 1.  [facility] referral for home hospice care. 2.  Arrange for home oxygen and other equipment needs. 3.  Outpatient follow-up will be scheduled at the Cancer center 4.  Steroid taper per the pulmonary service   LOS: 7 days   Betsy Coder, DNP, AGPCNP-BC, AOCNP 05/24/19

## 2019-05-24 NOTE — Discharge Summary (Signed)
Physician Discharge Summary  Summer Nielsen FOY:[id number] DOB: [birth year] DOA: 05/17/2019  PCP: Maurice Small, MD  Admit date: 05/17/2019 Discharge date: 05/24/2019  Time spent: 35 minutes  Recommendations for Outpatient Follow-up:  Home with hospice  Discharge Diagnoses:  Principal Problem:   Acute respiratory failure ([place]) Acute on chronic diastolic CHF Interstitial lung disease, pulmonary fibrosis Metastatic cholangiocarcinoma AKI on CKD stage IIIa   TIA (transient ischemic attack)   Postsurgical hypothyroidism   Hyperlipidemia   Hypertensive heart disease with heart failure ([place])   Cholangiocarcinoma metastatic to liver (HCC)   Acute on chronic diastolic CHF (congestive heart failure) ([place])  Discharge Condition: Stable  Diet recommendation: Low-sodium, heart healthy  Filed Weights   05/20/19 0654 05/21/19 0459 05/23/19 0500  Weight: 59.7 kg 59.3 kg 61.5 kg    History of present illness:  86 year old female with history of metastatic cholangiocarcinoma to the liver, pulmonary fibrosis, ILD/ILD, chronic diastolic CHF, hypertension, TIA presented to the emergency room with shortness of breath . -Admitted with acute on chronic respiratory failure secondary to CHF and pulmonary fibrosis    Hospital Course:   Acute respiratory failure (HCC)with hypoxia - likely secondary toAcute on chronic diastolic CHF  - and underlying pulmonary fibrosis/interstitial lung disease  -Diuresed with IV Lasix, also seen by cardiology in consultation, initially required BiPAP -echo showed EF of 60 to 76%, grade 2 diastolic dysfunction -At baseline on 4 L home O2, currently on 6 L  -Clinically felt to be euvolemic at this time and transition to oral Lasix -Per Dr. Learta Codding there is also possibility that her chemo could have contributed to some pneumonitis -Oncology discussed hospice and recommended home hospice to the patient  -Case management consulted and home hospice  arranged  Cholangiocarcinoma metastatic to liver [facility]), history of pulmonary fibrosis -Completed chemotherapy on 04/11/2019,  -Imaging notes worsening liver metastasis -followed Dr. Benay Spice, not felt to be a candidate for second line chemotherapy, he recommended home hospice  AKI on CKD 3 -Creatinine peaked at 1.93 on March 14, trending down towards baseline around 1.57  history of TIA (transient ischemic attack) -Continue statin -Resume aspirin  Anemia, macrocytic: Likely anemia of chronic disease, malignancy -Due to iron deficiency, malignancy and chemo  Hyperlipidemia -Continue statin   Hyperkalemia -Resolved potassium 3.7   Hyperglycemia -CBGs improving with tapering of steroids  Postsurgical hypothyroidism -TSH 4.6 Continue Synthroid  History of C. difficile in 2020 -No issues at this time   Discharge Exam: Vitals:   05/24/19 0907 05/24/19 1412  BP:  (!) 146/65  Pulse:  (!) 58  Resp:  19  Temp:  (!) 97 F (36.1 C)  SpO2: 91%     General:  AAOx3 Cardiovascular: S1S2/RRR Respiratory: CTAB  Discharge Instructions   Discharge Instructions    Diet - low sodium heart healthy   Complete by: As directed    Increase activity slowly   Complete by: As directed      Allergies as of 05/24/2019      Reactions   Levofloxacin Nausea Only   Dizzy, hot, foot pain, insomnia   Percocet [oxycodone-acetaminophen] Nausea And Vomiting, Other (See Comments)   sick      Medication List    STOP taking these medications   HYDROmorphone 2 MG tablet Commonly known as: Dilaudid   losartan 50 MG tablet Commonly known as: COZAAR     TAKE these medications   acetaminophen 500 MG tablet Commonly known as: TYLENOL Take 1,000 mg by mouth daily as needed for moderate pain  or headache.   albuterol 108 (90 Base) MCG/ACT inhaler Commonly known as: VENTOLIN HFA Inhale 2 puffs into the lungs every 6 (six) hours as needed for wheezing or shortness of  breath.   amLODipine 5 MG tablet Commonly known as: NORVASC Take 5 mg by mouth daily.   aspirin 81 MG chewable tablet Chew 81 mg by mouth daily.   atorvastatin 20 MG tablet Commonly known as: LIPITOR Take 20 mg by mouth daily.   bimatoprost 0.01 % Soln Commonly known as: LUMIGAN Place 1 drop into both eyes at bedtime.   Breo Ellipta 100-25 MCG/INH Aepb Generic drug: fluticasone furoate-vilanterol Inhale 1 puff into the lungs daily.   calcium-vitamin D 500-200 MG-UNIT tablet Commonly known as: OSCAL WITH D Take 1 tablet by mouth daily.   dicyclomine 20 MG tablet Commonly known as: BENTYL Take 20 mg by mouth 3 (three) times daily.   furosemide 40 MG tablet Commonly known as: LASIX Take 40-60 mg by mouth See admin instructions. Take 60 mg in the AM and 40 mg after lunch   Fusion Plus Caps Take 1 capsule by mouth daily.   gabapentin 300 MG capsule Commonly known as: NEURONTIN TAKE 1 CAPSULE BY MOUTH THREE TIMES A DAY What changed: See the new instructions.   ibuprofen 200 MG tablet Commonly known as: ADVIL Take 200 mg by mouth every 6 (six) hours as needed for moderate pain (back surgery).   Klor-Con M10 10 MEQ tablet Generic drug: potassium chloride TAKE 2 TABLETS EVERY DAY   levothyroxine 150 MCG tablet Commonly known as: SYNTHROID Take 150 mcg by mouth daily.   lidocaine-prilocaine cream Commonly known as: EMLA Apply 1 application topically as directed.   loperamide 2 MG capsule Commonly known as: IMODIUM Take 2 capsules by mouth 4 (four) times daily as needed.   omeprazole 40 MG capsule Commonly known as: PRILOSEC Take 40 mg by mouth daily before breakfast.   predniSONE 10 MG tablet Commonly known as: DELTASONE Take 1-4 tablets (10-40 mg total) by mouth daily with breakfast. Take 4 tabs for 2days then 2 tabs for 2days then 1 tab for 2days then STOP What changed:   how much to take  how to take this  when to take this  additional  instructions   propranolol 20 MG tablet Commonly known as: INDERAL Take 20 mg by mouth 2 (two) times daily.      Allergies  Allergen Reactions  . Levofloxacin Nausea Only    Dizzy, hot, foot pain, insomnia  . Percocet [Oxycodone-Acetaminophen] Nausea And Vomiting and Other (See Comments)    sick      The results of significant diagnostics from this hospitalization (including imaging, microbiology, ancillary and laboratory) are listed below for reference.    Significant Diagnostic Studies: DG Chest 2 View  Result Date: 05/21/2019 CLINICAL DATA:  Shortness of breath. EXAM: CHEST - 2 VIEW COMPARISON:  May 17, 2019. FINDINGS: Stable cardiomediastinal silhouette. Atherosclerosis of thoracic aorta is noted. Right internal jugular Port-A-Cath is unchanged in position. Stable bilateral lung opacities are noted, right greater than left, concerning for edema or possibly inflammation. No pneumothorax is noted. Small left pleural effusion is noted. Bony thorax is unremarkable. IMPRESSION: Stable bilateral lung opacities are noted, right greater than left, concerning for edema or possibly inflammation. Small left pleural effusion. Aortic Atherosclerosis (ICD10-I70.0). Electronically Signed   By: Marijo Conception M.D.   On: 05/21/2019 11:30   CT Chest High Resolution  Result Date: 05/22/2019 CLINICAL DATA:  86 year old  female with history of hypoxemia. EXAM: CT CHEST WITHOUT CONTRAST TECHNIQUE: Multidetector CT imaging of the chest was performed following the standard protocol without intravenous contrast. High resolution imaging of the lungs, as well as inspiratory and expiratory imaging, was performed. COMPARISON:  Chest CT 02/16/2019. FINDINGS: Cardiovascular: Heart size is mildly enlarged with left atrial dilatation. There is no significant pericardial fluid, thickening or pericardial calcification. There is aortic atherosclerosis, as well as atherosclerosis of the great vessels of the mediastinum  and the coronary arteries, including calcified atherosclerotic plaque in the left main, left anterior descending, left circumflex and right coronary arteries. Mediastinum/Nodes: No pathologically enlarged mediastinal or hilar lymph nodes. Esophagus is unremarkable in appearance. No axillary lymphadenopathy. Lungs/Pleura: High-resolution images demonstrate asymmetrically distributed patchy areas of ground-glass attenuation, septal thickening, thickening of the peribronchovascular interstitium, mild cylindrical bronchiectasis and regional architectural distortion. These findings have no craniocaudal gradient, and are more severe in the right lung than the left. Small bilateral pleural effusions lying dependently. Upper Abdomen: Hypoattenuating lesions in the liver, incompletely characterized on today's non-contrast CT examination, but grossly enlarged compared to the prior examination from 02/16/2019. Specifically, the largest lesion located predominantly in segments 4A & 4B has dramatically increased in size and currently measures at least 6.6 x 5.9 cm (axial image 129 of series 2) as compared with 2.9 x 2.4 cm on the prior study. Atrophy of the left kidney. Incompletely imaged low-attenuation lesion in the lateral aspect of the upper pole the right kidney, previously characterized as a simple cyst. Aortic atherosclerosis. Status post cholecystectomy. Musculoskeletal: Chronic appearing compression fracture of T12 with 40% loss of anterior vertebral body height, unchanged. There are no aggressive appearing lytic or blastic lesions noted in the visualized portions of the skeleton. IMPRESSION: 1. Unusual appearance of the lungs, which is not favored to reflect interstitial lung disease. Rather, findings are favored to reflect an acute infectious or inflammatory response such as viral pneumonia or drug reaction. Some component of interstitial pulmonary edema is also possible, particularly in light of the cardiomegaly and  bilateral pleural effusions, however, the asymmetry of the findings cannot be accounted for by pulmonary edema alone. 2. Enlargement of hypoattenuating liver lesions highly concerning for progressive disease in this patient with known history of cholangiocarcinoma. 3. Aortic atherosclerosis, in addition to left main and 3 vessel coronary artery disease. 4. Additional incidental findings, as above. Aortic Atherosclerosis (ICD10-I70.0). Electronically Signed   By: Vinnie Langton M.D.   On: 05/22/2019 08:25   DG Chest Port 1 View  Result Date: 05/17/2019 CLINICAL DATA:  Shortness of breath EXAM: PORTABLE CHEST 1 VIEW COMPARISON:  03/13/2019 FINDINGS: Cardiac shadow is enlarged but stable. Aortic calcifications are seen. Right chest wall port is noted. Increasing vascular congestion is noted with parenchymal edema worse on the right than the left. No bony abnormality is seen. IMPRESSION: Increasing vascular congestion and parenchymal edema. Electronically Signed   By: Inez Catalina M.D.   On: 05/17/2019 15:43   ECHOCARDIOGRAM COMPLETE  Result Date: 05/18/2019    ECHOCARDIOGRAM REPORT   Patient Name:   SHARLETTE JANSMA Date of Exam: 05/18/2019 Medical Rec #:  [id number]         Height:       62.0 in Accession #:    [id number]        Weight:       134.5 lb Date of Birth:  [birth year]         BSA:          1.615  m Patient Age:    18 years          BP:           154/69 mmHg Patient Gender: F                 HR:           74 bpm. Exam Location:  Inpatient Procedure: 2D Echo, Cardiac Doppler and Color Doppler Indications:    I50.23 Acute on chronic systolic (congestive) heart failure  History:        Patient has prior history of Echocardiogram examinations, most                 recent 10/19/2018. TIA; Risk Factors:Hypertension and                 Dyslipidemia. Cancer. GERD. Hypothyroidism.  Sonographer:    Jonelle Sidle Dance Referring Phys: [id number] RIPUDEEP K RAI IMPRESSIONS  1. Left ventricular ejection fraction, by  estimation, is 60 to 65%. The left ventricle has normal function. The left ventricle has no regional wall motion abnormalities. Left ventricular diastolic parameters are consistent with Grade II diastolic dysfunction (pseudonormalization).  2. Right ventricular systolic function is mildly reduced. The right ventricular size is normal. There is mildly elevated pulmonary artery systolic pressure.  3. Left atrial size was moderately dilated.  4. The mitral valve is grossly normal. Mild mitral valve regurgitation. No evidence of mitral stenosis.  5. The aortic valve is tricuspid. Aortic valve regurgitation is trivial. No aortic stenosis is present. FINDINGS  Left Ventricle: Left ventricular ejection fraction, by estimation, is 60 to 65%. The left ventricle has normal function. The left ventricle has no regional wall motion abnormalities. The left ventricular internal cavity size was normal in size. There is  no left ventricular hypertrophy. Left ventricular diastolic parameters are consistent with Grade II diastolic dysfunction (pseudonormalization). Right Ventricle: The right ventricular size is normal. No increase in right ventricular wall thickness. Right ventricular systolic function is mildly reduced. There is mildly elevated pulmonary artery systolic pressure. The tricuspid regurgitant velocity  is 2.79 m/s, and with an assumed right atrial pressure of 3 mmHg, the estimated right ventricular systolic pressure is 21.1 mmHg. Left Atrium: Left atrial size was moderately dilated. Right Atrium: Right atrial size was normal in size. Pericardium: There is no evidence of pericardial effusion. Mitral Valve: The mitral valve is grossly normal. Moderate mitral annular calcification. Mild mitral valve regurgitation. No evidence of mitral valve stenosis. Tricuspid Valve: The tricuspid valve is normal in structure. Tricuspid valve regurgitation is mild . No evidence of tricuspid stenosis. Aortic Valve: The aortic valve is  tricuspid. Aortic valve regurgitation is trivial. Aortic regurgitation PHT measures 454 msec. No aortic stenosis is present. Pulmonic Valve: The pulmonic valve was normal in structure. Pulmonic valve regurgitation is not visualized. No evidence of pulmonic stenosis. Aorta: The aortic root and ascending aorta are structurally normal, with no evidence of dilitation. IAS/Shunts: The atrial septum is grossly normal.  LEFT VENTRICLE PLAX 2D LVIDd:         4.70 cm  Diastology LVIDs:         3.20 cm  LV e' lateral:   8.05 cm/s LV PW:         1.10 cm  LV E/e' lateral: 14.4 LV IVS:        1.00 cm  LV e' medial:    4.79 cm/s LVOT diam:     2.10 cm  LV E/e' medial:  24.2  LV SV:         70 LV SV Index:   43 LVOT Area:     3.46 cm  RIGHT VENTRICLE             IVC RV Basal diam:  2.10 cm     IVC diam: 1.70 cm RV S prime:     10.70 cm/s TAPSE (M-mode): 1.7 cm LEFT ATRIUM             Index       RIGHT ATRIUM          Index LA diam:        4.30 cm 2.66 cm/m  RA Area:     8.81 cm LA Vol (A2C):   67.1 ml 41.55 ml/m RA Volume:   12.50 ml 7.74 ml/m LA Vol (A4C):   70.9 ml 43.90 ml/m LA Biplane Vol: 68.8 ml 42.60 ml/m  AORTIC VALVE LVOT Vmax:   82.50 cm/s LVOT Vmean:  53.800 cm/s LVOT VTI:    0.202 m AI PHT:      454 msec  AORTA Ao Root diam: 3.20 cm Ao Asc diam:  2.80 cm MITRAL VALVE                TRICUSPID VALVE MV Area (PHT): 3.77 cm     TR Peak grad:   31.1 mmHg MV Decel Time: 201 msec     TR Vmax:        279.00 cm/s MV E velocity: 116.00 cm/s MV A velocity: 81.90 cm/s   SHUNTS MV E/A ratio:  1.42         Systemic VTI:  0.20 m                             Systemic Diam: 2.10 cm Mertie Moores MD Electronically signed by Mertie Moores MD Signature Date/Time: 05/18/2019/11:20:46 AM    Final    VAS [place] LOWER EXTREMITY VENOUS (DVT)  Result Date: 05/21/2019  Lower Venous DVTStudy Indications: Swelling.  Risk Factors: Cancer metastatic cholangiocarcinoma. Anticoagulation: Heparin. Limitations: Left leg birth deformity. Comparison  Study: No prior exam. Performing Technologist: Baldwin Crown ARDMS, RVT  Examination Guidelines: A complete evaluation includes B-mode imaging, spectral Doppler, color Doppler, and power Doppler as needed of all accessible portions of each vessel. Bilateral testing is considered an integral part of a complete examination. Limited examinations for reoccurring indications may be performed as noted. The reflux portion of the exam is performed with the patient in reverse Trendelenburg.  +---------+---------------+---------+-----------+----------+--------------+ RIGHT    CompressibilityPhasicitySpontaneityPropertiesThrombus Aging +---------+---------------+---------+-----------+----------+--------------+ CFV      Full           Yes      Yes                                 +---------+---------------+---------+-----------+----------+--------------+ SFJ      Full                                                        +---------+---------------+---------+-----------+----------+--------------+ FV Prox  Full                                                        +---------+---------------+---------+-----------+----------+--------------+  FV Mid   Full                                                        +---------+---------------+---------+-----------+----------+--------------+ FV DistalFull                                                        +---------+---------------+---------+-----------+----------+--------------+ PFV      Full                                                        +---------+---------------+---------+-----------+----------+--------------+ POP      Full           Yes      Yes                                 +---------+---------------+---------+-----------+----------+--------------+ PTV      Full                                                        +---------+---------------+---------+-----------+----------+--------------+ PERO      Full                                                        +---------+---------------+---------+-----------+----------+--------------+   +---------+---------------+---------+-----------+----------+--------------+ LEFT     CompressibilityPhasicitySpontaneityPropertiesThrombus Aging +---------+---------------+---------+-----------+----------+--------------+ CFV      Full           Yes      Yes                                 +---------+---------------+---------+-----------+----------+--------------+ SFJ      Full                                                        +---------+---------------+---------+-----------+----------+--------------+ FV Prox  Full                                                        +---------+---------------+---------+-----------+----------+--------------+ FV Mid   Full                                                        +---------+---------------+---------+-----------+----------+--------------+  FV DistalFull                                                        +---------+---------------+---------+-----------+----------+--------------+ PFV      Full                                                        +---------+---------------+---------+-----------+----------+--------------+ POP      Full           Yes      Yes                                 +---------+---------------+---------+-----------+----------+--------------+ PTV      Full                                                        +---------+---------------+---------+-----------+----------+--------------+ PERO     Full                                                        +---------+---------------+---------+-----------+----------+--------------+     Summary: RIGHT: - There is no evidence of deep vein thrombosis in the lower extremity.  - No cystic structure found in the popliteal fossa.  LEFT: - There is no evidence of deep vein thrombosis in the  lower extremity. However, portions of this examination were limited- see technologist comments above.  - No cystic structure found in the popliteal fossa.  *See table(s) above for measurements and observations. Electronically signed by Ruta Hinds MD on 05/21/2019 at 7:05:42 PM.    Final     Microbiology: Recent Results (from the past 240 hour(s))  SARS CORONAVIRUS 2 (TAT 6-24 HRS) Nasopharyngeal Nasopharyngeal Swab     Status: None   Collection Time: 05/17/19  6:02 PM   Specimen: Nasopharyngeal Swab  Result Value Ref Range Status   SARS Coronavirus 2 NEGATIVE NEGATIVE Final    Comment: (NOTE) SARS-CoV-2 target nucleic acids are NOT DETECTED. The SARS-CoV-2 RNA is generally detectable in upper and lower respiratory specimens during the acute phase of infection. Negative results do not preclude SARS-CoV-2 infection, do not rule out co-infections with other pathogens, and should not be used as the sole basis for treatment or other patient management decisions. Negative results must be combined with clinical observations, patient history, and epidemiological information. The expected result is Negative. Fact Sheet for Patients: SugarRoll.be Fact Sheet for Healthcare Providers: https://www.woods-mathews.com/ This test is not yet approved or cleared by the [place] FDA and  has been authorized for detection and/or diagnosis of SARS-CoV-2 by FDA under an Emergency Use Authorization (EUA). This EUA will remain  in effect (meaning this test can be used) for the duration of the COVID-19 declaration under Section 56 4(b)(1) of the Act, 21 U.S.C. section 360bbb-3(b)(1),  unless the authorization is terminated or revoked sooner. Performed at [facility], [place] [address], [place], [place] [address]   MRSA PCR Screening     Status: None   Collection Time: 05/18/19  5:41 PM   Specimen: Nasal Mucosa; Nasopharyngeal  Result Value Ref Range  Status   MRSA by PCR NEGATIVE NEGATIVE Final    Comment:        The GeneXpert MRSA Assay (FDA approved for NASAL specimens only), is one component of a comprehensive MRSA colonization surveillance program. It is not intended to diagnose MRSA infection nor to guide or monitor treatment for MRSA infections. Performed at [facility], [place] [address], [place], [place] [address]   Respiratory Panel by PCR     Status: None   Collection Time: 05/21/19 12:06 PM   Specimen: Nasopharyngeal Swab; Respiratory  Result Value Ref Range Status   Adenovirus NOT DETECTED NOT DETECTED Final   Coronavirus 229E NOT DETECTED NOT DETECTED Final    Comment: (NOTE) The Coronavirus on the Respiratory Panel, DOES NOT test for the novel  Coronavirus (2019 nCoV)    Coronavirus HKU1 NOT DETECTED NOT DETECTED Final   Coronavirus NL63 NOT DETECTED NOT DETECTED Final   Coronavirus OC43 NOT DETECTED NOT DETECTED Final   Metapneumovirus NOT DETECTED NOT DETECTED Final   Rhinovirus / Enterovirus NOT DETECTED NOT DETECTED Final   Influenza A NOT DETECTED NOT DETECTED Final   Influenza B NOT DETECTED NOT DETECTED Final   Parainfluenza Virus 1 NOT DETECTED NOT DETECTED Final   Parainfluenza Virus 2 NOT DETECTED NOT DETECTED Final   Parainfluenza Virus 3 NOT DETECTED NOT DETECTED Final   Parainfluenza Virus 4 NOT DETECTED NOT DETECTED Final   Respiratory Syncytial Virus NOT DETECTED NOT DETECTED Final   Bordetella pertussis NOT DETECTED NOT DETECTED Final   Chlamydophila pneumoniae NOT DETECTED NOT DETECTED Final   Mycoplasma pneumoniae NOT DETECTED NOT DETECTED Final    Comment: Performed at [facility], [place]. [address] [place], [place] [address]  Expectorated sputum assessment w rflx to resp cult     Status: None   Collection Time: 05/21/19  1:41 PM   Specimen: Sputum  Result Value Ref Range Status   Specimen Description SPUTUM  Final   Special Requests NONE  Final   Sputum  evaluation   Final    Sputum specimen not acceptable for testing.  Please recollect.   Performed at [facility], [place] [address] [place], [place] [address]    Report Status 05/21/2019 FINAL  Final     Labs: Basic Metabolic Panel: Recent Labs  Lab 05/18/19 0430 05/18/19 0810 05/19/19 0233 05/20/19 0500 05/20/19 2332 05/22/19 0407 05/23/19 0407  NA   < >  --  139 138 135 140 140  K   < >  --  4.0 3.7 3.4* 4.2 4.1  CL   < >  --  104 100 98 105 105  CO2   < >  --  _0 GLUCOSE   < >  --  91 206* 142* 136* 146*  BUN   < >  --  43* 51* 54* 59* 68*  CREATININE   < >  --  1.57* 1.93* 1.68* 1.64* 1.58*  CALCIUM   < >  --  8.9 8.6* 8.1* 8.2* 8.3*  MG  --  2.5*  --   --   --   --  2.4   < > =  values in this interval not displayed.   Liver Function Tests: Recent Labs  Lab 05/17/19 1529  AST 28  ALT 17  ALKPHOS 65  BILITOT 1.1  PROT 7.1  ALBUMIN 3.6   No results for input(s): LIPASE, AMYLASE in the last 168 hours. No results for input(s): AMMONIA in the last 168 hours. CBC: Recent Labs  Lab 05/20/19 0500 05/20/19 2332 05/22/19 0407 05/23/19 0407 05/24/19 0416  WBC 9.0 12.8* 9.8 8.6 10.2  HGB 8.8* 8.4* 8.8* 9.3* 8.7*  HCT 27.4* 26.8* 27.8* 30.1* 28.8*  MCV 96.8 96.8 98.9 98.0 100.0  PLT 302 313 308 307 284   Cardiac Enzymes: No results for input(s): CKTOTAL, CKMB, CKMBINDEX, TROPONINI in the last 168 hours. BNP: BNP (last 3 results) Recent Labs    05/17/19 1529  BNP 957.7*    ProBNP (last 3 results) No results for input(s): PROBNP in the last 8760 hours.  CBG: No results for input(s): GLUCAP in the last 168 hours.     Signed:  Domenic Polite MD.  [facility] 05/24/2019, 3:39 PM

## 2019-05-24 NOTE — Progress Notes (Signed)
PT Cancellation Note  Patient Details Name: Latorya Bautch MRN: [id number] DOB: 20-Sep-1927   Cancelled Treatment:    Reason Eval/Treat Not Completed: Other (comment)(Being discharged to home with Hospice= in room awaiting transport.)  Rollen Sox, PT # [phone] CGV cell  Casandra Doffing 05/24/2019, 3:50 PM

## 2019-06-01 ENCOUNTER — Telehealth: Payer: Self-pay | Admitting: Cardiology

## 2019-06-01 NOTE — Telephone Encounter (Signed)
[employer] called to verify if patient saw Dr. Meda Coffee - I advised that she does not see Dr. Meda Coffee per our notes.

## 2019-06-11 ENCOUNTER — Telehealth: Payer: Self-pay | Admitting: Cardiology

## 2019-06-11 NOTE — Telephone Encounter (Signed)
Advised patient that we do offer virtual appointments but that I will forward her message to Dr. Marlou Porch and his primary RN, Jeannene Patella for decision regarding her appointment for Friday. She verbalized understanding and thanked me for the call.

## 2019-06-11 NOTE — Telephone Encounter (Signed)
New Message    Pt is calling and is wondering if her appt can be changed to Virtual. She feels worried because she has not been out    Please call back

## 2019-06-12 NOTE — Telephone Encounter (Signed)
Pt aware OK for virtual appt.  Your [facility] team (Cardiologist [Heart Doctor] and Advanced Practice Provider [phone] Assistant; Nurse Practitioner]) has arranged for your next office appointment to be a virtual visit (also known as "Telehealth", "Telemedicine", "E-Visit").   We now offer virtual visits for all our patients.  This helps [place] to expand our ability to see patients in a timely and safe manner.  These visits are billed to your insurance just like traditional, in person, appointments.  Please review this IMPORTANT information about your upcoming appointment.   **PLEASE READ THE SECTION BELOW LABELED "CONSENT".**   **CALL OUR OFFICE WITH QUESTIONS.**   WHAT YOU NEED FOR YOUR VIRTUAL VISIT:  You will need a SmartPhone with microphone and video capability.  [If you are using MyChart to connect to your visit, it is also possible to use a desktop/laptop computer (with an Internet connection), as long as you have microphone and video capability.]  You will need to use Chrome, Edge or [employer] as your Wellsite geologist.  We highly recommend that you have a MyChart account as this will make connecting to your visit seamless.  A MyChart account not only allows you to connect to your provider for a virtual visit, but also allows you to see the results of all your tests, provider notes, medications and upcoming appointments.    A MyChart account is not absolutely necessary.  We can still complete your visit if you do not have one.    If you do not have a computer or SmartPhone with video/microphone capability or your Internet/cell service is weak on the day of your visit, we will do your visit by telephone.    A blood pressure cuff and scale are essential to collect your vital signs at home.  If you do not have these and you are unable to obtain them, please contact our office so that we can make arrangements for you.  If you have a pulse oximeter, Apple watch, Kardia mobile device,  etc, you can collect data from these devices as well to share with the provider for your visit.  These devices are not required for a virtual visit.    WHAT TO DO ON THE DAY OF YOUR APPOINTMENT: 30 minutes before your appointment:  Take your blood pressure, pulse or heart rate (if your blood pressure machine is able to collect it) and weight.  Write all these numbers down so you can give it to the nurse or medical assistant that calls.  Get all of the medications you currently take and put them where you will be sitting for the appointment.  The nurse/medical assistant will go over these with you when he/she calls.  15 minutes before your appointment:  You will receive a phone call from a nurse or medical assistant from our office.  The caller ID on your phone may indicate that the caller is either "[facility]" or "[place]".  However, the number may come across as spam.  Please turn off any spam blocker so you do not miss the call.  The nurse will:  Ask for your blood pressure, pulse, weight, height.  Go over all of your medications to make sure your chart is correct.  Review your allergies, smoking history, reason for appointment, etc.   Give you instructions on how to connect with the video platform or telephone.  IF YOUR VISIT IS BY TELEPHONE ONLY: After the nurse finishes getting you ready for the visit, your provider will call  you on the phone number you provide to [place].   TO CONNECT WITH YOUR PROVIDER FOR YOUR APPOINTMENT (BY VIDEO): You will either connect with the provider with your MyChart account or (if you are not using MyChart) with a link sent to your SmartPhone by text message.  If you are using MyChart, see below.  If you are not using MyChart, the nurse will send you the text message during the phone call.     If you are connecting with your MyChart account:   (The nurse that calls you will tell you when to do this):  You will log into your account. At the top  of your home screen, you should see the following prompt that tells you to "Begin your video visit with . . . ".  Click the green button (BEGIN VISIT).    The next screen will say "It's time to start your video visit!" Click the green button (BEGIN VIDEO VISIT)    There may be a screen that appears that asks for permission to use your camera and/or microphone.  Click ALLOW.  This will open the browser where your appointment will take place.   You may see the message: "Welcome.  Waiting for the call to begin."  Or, you will see that the nurse or your provider are already "in" the room waiting on you.   If you are connecting with a link sent to your SmartPhone via text: The nurse that calls you will send the link by text message. Click on the link (it should look something like this):     If asked to give permission to use the camera and or microphone, click ALLOW This will open the browser where your appointment will take place.      You may see the message: "Welcome.  Waiting for the call to begin."  Or, you will see that the nurse or your provider are already "in" the room waiting on you.    The controls for your visit look like the picture below.  Please note that this is what the microphone and camera look like when they are ON.  If muted, they will have a line through them.    After the appointment: Once your provider leaves the appointment, he/she will go over instructions with the nurse/medical assistant.  If needed, this person will call you with any instructions, appointments, etc.   A copy of your After Visit Summary (AVS) will be available later that day in your MyChart account.  This document will have all of your instructions, medications, appointments, etc.  If you are not using MyChart, we will mail it to your home.     *CONSENT FOR TELE-HEALTH VISIT - PLEASE REVIEW* By participating in the scheduled virtual visit (and any virtual visit scheduled within 365 days of  the printing of this document), I agree to the following:   I hereby voluntarily request, consent and authorize [facility] and its employed or contracted physicians, physician assistants, nurse practitioners or other licensed health care professionals (the Practitioner), to provide me with telemedicine health care services (the "Services") as deemed necessary by the treating Practitioner. I acknowledge and consent to receive the Services by the Practitioner via telemedicine. I understand that the telemedicine visit will involve communicating with the Practitioner through live audiovisual communication technology and the disclosure of certain medical information by electronic transmission. I acknowledge that I have been given the opportunity to request an in-person assessment or other available alternative prior  to the telemedicine visit and am voluntarily participating in the telemedicine visit.  I understand that I have the right to withhold or withdraw my consent to the use of telemedicine in the course of my care at any time, without affecting my right to future care or treatment, and that the Practitioner or I may terminate the telemedicine visit at any time. I understand that I have the right to inspect all information obtained and/or recorded in the course of the telemedicine visit and may receive copies of available information for a reasonable fee.  I understand that some of the potential risks of receiving the Services via telemedicine include:  Marland Kitchen Delay or interruption in medical evaluation due to technological equipment failure or disruption; . Information transmitted may not be sufficient (e.g. poor resolution of images) to allow for appropriate medical decision making by the Practitioner; and/or  . In rare instances, security protocols could fail, causing a breach of personal health information.  Furthermore, I acknowledge that it is my responsibility to provide information about my medical  history, conditions and care that is complete and accurate to the best of my ability. I acknowledge that Practitioner's advice, recommendations, and/or decision may be based on factors not within their control, such as incomplete or inaccurate data provided by me or distortions of diagnostic images or specimens that may result from electronic transmissions. I understand that the practice of medicine is not an exact science and that Practitioner makes no warranties or guarantees regarding treatment outcomes. I acknowledge that a copy of this consent can be made available to me via my patient portal ([facility]), or I can request a printed copy by calling the office of [place].    I understand that my insurance will be billed for this visit.   I have read or had this consent read to me. . I understand the contents of this consent, which adequately explains the benefits and risks of the Services being provided via telemedicine.  . I have been provided ample opportunity to ask questions regarding this consent and the Services and have had my questions answered to my satisfaction. . I give my informed consent for the services to be provided through the use of telemedicine in my medical care   Pt is agreeable.

## 2019-06-12 NOTE — Telephone Encounter (Signed)
OK with me for virtual Candee Furbish, MD

## 2019-06-15 ENCOUNTER — Encounter: Payer: Self-pay | Admitting: Cardiology

## 2019-06-15 ENCOUNTER — Telehealth (INDEPENDENT_AMBULATORY_CARE_PROVIDER_SITE_OTHER): Admitting: Cardiology

## 2019-06-15 ENCOUNTER — Other Ambulatory Visit: Payer: Self-pay

## 2019-06-15 VITALS — BP 168/63 | Ht 62.0 in | Wt 127.0 lb

## 2019-06-15 DIAGNOSIS — I5032 Chronic diastolic (congestive) heart failure: Secondary | ICD-10-CM | POA: Diagnosis not present

## 2019-06-15 DIAGNOSIS — Z9981 Dependence on supplemental oxygen: Secondary | ICD-10-CM | POA: Diagnosis not present

## 2019-06-15 DIAGNOSIS — R0902 Hypoxemia: Secondary | ICD-10-CM | POA: Diagnosis not present

## 2019-06-15 DIAGNOSIS — J849 Interstitial pulmonary disease, unspecified: Secondary | ICD-10-CM | POA: Diagnosis not present

## 2019-06-15 DIAGNOSIS — C221 Intrahepatic bile duct carcinoma: Secondary | ICD-10-CM

## 2019-06-15 DIAGNOSIS — C799 Secondary malignant neoplasm of unspecified site: Secondary | ICD-10-CM | POA: Diagnosis not present

## 2019-06-15 DIAGNOSIS — N1831 Chronic kidney disease, stage 3a: Secondary | ICD-10-CM | POA: Diagnosis not present

## 2019-06-15 NOTE — Patient Instructions (Signed)
Medication Instructions:  The current medical regimen is effective;  continue present plan and medications.  *If you need a refill on your cardiac medications before your next appointment, please call your pharmacy*  Follow-Up: At [facility], you and your health needs are our priority.  As part of our continuing mission to provide you with exceptional heart care, we have created designated Provider Care Teams.  These Care Teams include your primary Cardiologist (physician) and Advanced Practice Providers (APPs -  Physician Assistants and Nurse Practitioners) who all work together to provide you with the care you need, when you need it.  We recommend signing up for the patient portal called "MyChart".  Sign up information is provided on this After Visit Summary.  MyChart is used to connect with patients for Virtual Visits (Telemedicine).  Patients are able to view lab/test results, encounter notes, upcoming appointments, etc.  Non-urgent messages can be sent to your provider as well.   To learn more about what you can do with MyChart, go to [email or web address].    Your next appointment:   4 month(s)  The format for your next appointment:   In Person  Provider:   Truitt Merle, NP   Thank you for choosing [facility]!!

## 2019-06-15 NOTE — Progress Notes (Signed)
Virtual Visit via Telephone Note   This visit type was conducted due to national recommendations for restrictions regarding the COVID-19 Pandemic (e.g. social distancing) in an effort to limit this patient's exposure and mitigate transmission in our community.  Due to her co-morbid illnesses, this patient is at least at moderate risk for complications without adequate follow up.  This format is felt to be most appropriate for this patient at this time.  The patient did not have access to video technology/had technical difficulties with video requiring transitioning to audio format only (telephone).  All issues noted in this document were discussed and addressed.  No physical exam could be performed with this format.  Please refer to the patient's chart for her  consent to telehealth for [facility].   The patient was identified using 2 identifiers.  Date:  06/15/2019   ID:  Summer Nielsen, DOB Feb 26, 1928, MRN [id number]  Patient Location: Home Provider Location: Office  PCP:  Maurice Small, MD  Cardiologist:  Candee Furbish, MD  Electrophysiologist:  None   Evaluation Performed:  Follow-Up Visit  Chief Complaint: Post hospital follow-up heart failure/interstitial lung disease/shortness of breath  History of Present Illness:    Summer Nielsen is a 86 y.o. female with chronic diastolic heart failure, chronic O2 use for interstitial lung disease post hospital with metastatic cholangiocarcinoma, oncology recommended hospice care.  Overall her weights have been stable.  See below for details.  No changes made in medications today.  She feels comfortable on her oxygen without any distress.  No chest pain no syncope no bleeding.  The patient does not have symptoms concerning for COVID-19 infection (fever, chills, cough, or new shortness of breath).    Past Medical History:  Diagnosis Date  . Cancer ([place])    squamous cell skin cancer on left leg-removed  . CHF (congestive heart  failure) ([place])   . Cholangiocarcinoma ([place]) dx'd 11/2017  . Complication of anesthesia   . GERD (gastroesophageal reflux disease)   . Glaucoma   . High blood pressure   . Hyperlipidemia   . Hypothyroidism   . OP (osteoporosis)   . PONV (postoperative nausea and vomiting)   . Postsurgical hypothyroidism   . Pulmonary fibrosis ([place])   . 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 Tachycardia   . TIA (transient ischemic attack)    Past Surgical History:  Procedure Laterality Date  . ABDOMINAL HYSTERECTOMY    . BACK SURGERY N/A 03/2017   Lumbar  . CHOLECYSTECTOMY    . EYE SURGERY     removed cateracts on both eyes  . IR IMAGING GUIDED PORT INSERTION  09/06/2018  . JOINT REPLACEMENT     3 hip replacement on left, right knee replacement  . THYROIDECTOMY       Current Meds  Medication Sig  . acetaminophen (TYLENOL) 500 MG tablet Take 1,000 mg by mouth daily as needed for moderate pain or headache.  . albuterol (VENTOLIN HFA) 108 (90 Base) MCG/ACT inhaler Inhale 2 puffs into the lungs every 6 (six) hours as needed for wheezing or shortness of breath.  Marland Kitchen amLODipine (NORVASC) 5 MG tablet Take 5 mg by mouth daily.  Marland Kitchen aspirin 81 MG chewable tablet Chew 81 mg by mouth daily.  Marland Kitchen atorvastatin (LIPITOR) 20 MG tablet Take 20 mg by mouth daily.  . bimatoprost (LUMIGAN) 0.01 % SOLN Place 1 drop into both eyes at bedtime.   . calcium-vitamin D (OSCAL WITH D) 500-200 MG-UNIT tablet Take  1 tablet by mouth daily.   Marland Kitchen dicyclomine (BENTYL) 20 MG tablet Take 20 mg by mouth 3 (three) times daily.  . fluticasone furoate-vilanterol (BREO ELLIPTA) 100-25 MCG/INH AEPB Inhale 1 puff into the lungs daily.  . furosemide (LASIX) 40 MG tablet Take 40-60 mg by mouth See admin instructions. Take 60 mg in the AM and 40 mg after lunch  . Iron-FA-B Cmp-C-Biot-Probiotic (FUSION PLUS) CAPS Take 1 capsule by mouth daily.  Marland Kitchen levothyroxine (SYNTHROID, LEVOTHROID) 150 MCG tablet Take 150 mcg by  mouth daily.  Marland Kitchen lidocaine-prilocaine (EMLA) cream Apply 1 application topically as directed.  . loperamide (IMODIUM) 2 MG capsule Take 2 capsules by mouth 4 (four) times daily as needed.  Marland Kitchen omeprazole (PRILOSEC) 40 MG capsule Take 40 mg by mouth daily before breakfast.   . propranolol (INDERAL) 20 MG tablet Take 20 mg by mouth 2 (two) times daily.      Allergies:   Levofloxacin and Percocet [oxycodone-acetaminophen]   Social History   Tobacco Use  . Smoking status: Never Smoker  . Smokeless tobacco: Never Used  Substance Use Topics  . Alcohol use: No  . Drug use: No     Family Hx: The patient's family history includes Cerebral aneurysm in her mother; Heart attack in her father and mother; Heart disease in her brother and sister; Hypertension in her mother; Stroke in her mother.  ROS:   Please see the history of present illness.     All other systems reviewed and are negative.   Prior CV studies:   The following studies were reviewed today:  Hospital records, echocardiogram-normal EF  Labs/Other Tests and Data Reviewed:    EKG:  Previously sinus rhythm  Recent Labs: 05/17/2019: ALT 17; B Natriuretic Peptide 957.7 05/18/2019: TSH 4.626 05/23/2019: BUN 68; Creatinine, Ser 1.58; Magnesium 2.4; Potassium 4.1; Sodium 140 05/24/2019: Hemoglobin 8.7; Platelets 284   Recent Lipid Panel Lab Results  Component Value Date/Time   CHOL 164 03/23/2018 02:18 PM   TRIG 169 (H) 03/23/2018 02:18 PM   HDL 45 03/23/2018 02:18 PM   CHOLHDL 3.6 03/23/2018 02:18 PM   LDLCALC 85 03/23/2018 02:18 PM    Wt Readings from Last 3 Encounters:  06/15/19 127 lb (57.6 kg)  05/23/19 135 lb 9.3 oz (61.5 kg)  05/08/19 134 lb 8 oz (61 kg)     Objective:    Vital Signs:  BP (!) 168/63   Ht _0  (1.575 m)   Wt 127 lb (57.6 kg)   LMP  (LMP Unknown)   SpO2 97%   BMI 23.23 kg/m    VITAL SIGNS:  reviewed  Able to complete full sentences without difficulty.  ASSESSMENT & PLAN:    Chronic  diastolic heart failure -Hospitalized in March 2021.  About 6 L off.  She has been weighing herself daily.  Weight today 127.  Has been stable.  Continue with current medication regimen.  Interstitial lung disease -On home O2 6 L.  Chronic.  Chronic kidney disease stage III -Continue with current furosemide.  Last creatinine 1.58  Nonsustained VT seen on monitor in hospital -Short-lived.  Normal EF on echo.  During the hospital stay she was on metoprolol but during discharge she was converted back to her propranolol that she has been taking for years.  I am fine with her continuing with propranolol.  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  Palliative care.  Worsening metastasis.  Hospice has been recommended by oncology.  We will have  her touch base with Cecille Rubin in 4 months.  COVID-19 Education: The signs and symptoms of COVID-19 were discussed with the patient and how to seek care for testing (follow up with PCP or arrange E-visit).  The importance of social distancing was discussed today.  Time:   Today, I have spent 15 minutes with the patient with telehealth technology discussing the above problems.     Medication Adjustments/Labs and Tests Ordered: Current medicines are reviewed at length with the patient today.  Concerns regarding medicines are outlined above.   Tests Ordered: No orders of the defined types were placed in this encounter.   Medication Changes: No orders of the defined types were placed in this encounter.   Follow Up:  Either In Person or Virtual in 4 month(s)  Signed, Candee Furbish, MD  06/15/2019 11:03 AM    [place]

## 2019-06-26 ENCOUNTER — Other Ambulatory Visit: Payer: Self-pay

## 2019-06-26 ENCOUNTER — Encounter: Payer: Self-pay | Admitting: Pulmonary Disease

## 2019-06-26 ENCOUNTER — Ambulatory Visit (INDEPENDENT_AMBULATORY_CARE_PROVIDER_SITE_OTHER): Payer: Medicare PPO | Admitting: Pulmonary Disease

## 2019-06-26 DIAGNOSIS — J849 Interstitial pulmonary disease, unspecified: Secondary | ICD-10-CM | POA: Diagnosis not present

## 2019-06-26 NOTE — Patient Instructions (Signed)
I am glad you are doing well with regard to your breathing I agree that hospice care and supportive treatment is the best option for you and I am glad you are getting all declines need We will also cancel the schedule pulmonary function test as it would not make any changes to the management  Follow-up in clinic as needed.

## 2019-06-26 NOTE — Progress Notes (Signed)
Virtual Visit via Telephone Note  I connected with Summer Nielsen on 06/26/19 at 10:00 AM EDT by telephone and verified that I am speaking with the correct person using two identifiers.  Location: Patient: Home Provider: [facility], [place], [place]   I discussed the limitations, risks, security and privacy concerns of performing an evaluation and management service by telephone and the availability of in person appointments. I also discussed with the patient that there may be a patient responsible charge related to this service. The patient expressed understanding and agreed to proceed.   History of Present Illness: Follow-up for interstitial lung disease  86 year old with history of CHF, diastolic heart failure, metastatic cholangiocarcinoma to the liver.  Initially referred to pulmonary in February 2021 for evaluation of dyspnea Work-up including HRCT showed interstitial lung disease in indeterminate pattern, CTD serologies with borderline positive ANA and CCP.  Since her last clinic visit she has been admitted with acute hypoxic respiratory failure, CHF requiring BiPAP, diuresis.  Enrolled in hospice after recommendation by her primary oncologist  No significant complaints at present.  Has chronic dyspnea on exertion, continues on supplemental oxygen, nebulizers.  Assessment and Plan: Acute hypoxic respiratory failure secondary to CHF, interstitial lung disease, pulmonary fibrosis Currently on hospice at home. No further work-up or treatment recommendations from our standpoint  Follow Up Instructions: Follow-up as needed   I discussed the assessment and treatment plan with the patient. The patient was provided an opportunity to ask questions and all were answered. The patient agreed with the plan and demonstrated an understanding of the instructions.   The patient was advised to call back or seek an in-person evaluation if the symptoms worsen or if the  condition fails to improve as anticipated.  I provided 25 minutes of non-face-to-face time during this encounter.   Marshell Garfinkel MD [facility] 06/26/2019, 10:37 AM

## 2019-07-10 ENCOUNTER — Other Ambulatory Visit (HOSPITAL_COMMUNITY): Payer: Medicare PPO

## 2019-10-02 NOTE — Progress Notes (Deleted)
CARDIOLOGY OFFICE NOTE  Date:  10/02/2019    [name] Date of Birth: Aug 10, 1927 Medical Record #[id number]  PCP:  Maurice Small, MD  Cardiologist:  Marisa Cyphers   No chief complaint on file.   History of Present Illness: Summer Nielsen is a 86 y.o. female who presents today for a follow up visit. Seen for Dr. Marlou Porch.   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  I last saw her in December - it was [place] that actually got her on oxygen. She was admitted in March with acute diastolic HF.  She had a virtual visit with Dr. Marlou Porch back in April. Very sad situation - she still tries to take care of her older husband. Was continuing with IV chemo.  . Comes in today. Here with   Past Medical History:  Diagnosis Date  . Cancer ([place])    squamous cell skin cancer on left leg-removed  . CHF (congestive heart failure) ([place])   . Cholangiocarcinoma ([place]) dx'd 11/2017  . Complication of anesthesia   . GERD (gastroesophageal reflux disease)   . Glaucoma   . High blood pressure   . Hyperlipidemia   . Hypothyroidism   . OP (osteoporosis)   . PONV (postoperative nausea and vomiting)   . Postsurgical hypothyroidism   . Pulmonary fibrosis ([place])   . 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 Tachycardia   . TIA (transient ischemic attack)     Past Surgical History:  Procedure Laterality Date  . ABDOMINAL HYSTERECTOMY    . BACK SURGERY N/A 03/2017   Lumbar  . CHOLECYSTECTOMY    . EYE SURGERY     removed cateracts on both eyes  . IR IMAGING GUIDED PORT INSERTION  09/06/2018  . JOINT REPLACEMENT     3 hip replacement on left, right knee replacement  . THYROIDECTOMY       Medications: No outpatient medications  have been marked as taking for the 10/15/19 encounter (Appointment) with Burtis Junes, NP.     Allergies: Allergies  Allergen Reactions  . Levofloxacin Nausea Only    Dizzy, hot, foot pain, insomnia  . Percocet [Oxycodone-Acetaminophen] Nausea And Vomiting and Other (See Comments)    sick    Social History: The patient  reports that she has never smoked. She has never used smokeless tobacco. She reports that she does not drink alcohol and does not use drugs.   Family History: The patient's ***family history includes Cerebral aneurysm in her mother; Heart attack in her father and mother; Heart disease in her brother and sister; Hypertension in her mother; Stroke in her mother.   Review of Systems: Please see the history of present illness.   All other systems are reviewed and negative.   Physical Exam: VS:  LMP  (LMP Unknown)  .  BMI There is no height or weight on file to calculate BMI.  Wt Readings from Last 3 Encounters:  06/15/19 127 lb (57.6 kg)  05/23/19 135 lb 9.3 oz (61.5 kg)  05/08/19 134 lb 8 oz (61 kg)    General: Pleasant. Well developed, well nourished and in no acute distress.   HEENT: Normal.  Neck: Supple, no JVD, carotid bruits, or masses noted.  Cardiac: ***Regular rate and rhythm. No murmurs, rubs, or gallops.  No edema.  Respiratory:  Lungs are clear to auscultation bilaterally with normal work of breathing.  GI: Soft and nontender.  MS: No deformity or atrophy. Gait and ROM intact.  Skin: Warm and dry. Color is normal.  Neuro:  Strength and sensation are intact and no gross focal deficits noted.  Psych: Alert, appropriate and with normal affect.   LABORATORY DATA:  EKG:  EKG {ACTION; IS/IS QIO:[id number]} ordered today.  Personally reviewed by me. This demonstrates ***.  Lab Results  Component Value Date   WBC 10.2 05/24/2019   HGB 8.7 (L) 05/24/2019   HCT 28.8 (L) 05/24/2019   PLT 284 05/24/2019   GLUCOSE 146 (H) 05/23/2019   CHOL 164  03/23/2018   TRIG 169 (H) 03/23/2018   HDL 45 03/23/2018   LDLCALC 85 03/23/2018   ALT 17 05/17/2019   AST 28 05/17/2019   NA 140 05/23/2019   K 4.1 05/23/2019   CL 105 05/23/2019   CREATININE 1.58 (H) 05/23/2019   BUN 68 (H) 05/23/2019   CO2 25 05/23/2019   TSH 4.626 (H) 05/18/2019   INR 1.1 09/06/2018       BNP (last 3 results) Recent Labs    05/17/19 1529  BNP 957.7*    ProBNP (last 3 results) No results for input(s): PROBNP in the last 8760 hours.   Other Studies Reviewed Today:  ECHO IMPRESSIONS 10/2018   1. The left ventricle has normal systolic function with an ejection fraction of 60-65%. The cavity size was normal. Left ventricular diastolic parameters were normal. 2. Normal GLS -18.7. 3. The right ventricle has normal systolic function. The cavity was normal. There is no increase in right ventricular wall thickness. 4. Left atrial size was mildly dilated. 5. Mild thickening of the mitral valve leaflet. Mild calcification of the mitral valve leaflet. There is mild mitral annular calcification present. 6. The aortic valve is tricuspid. Mild thickening of the aortic valve. Mild calcification of the aortic valve. Aortic valve regurgitation is trivial by color flow Doppler. 7. The aorta is normal unless otherwise noted. 8. Small calcified mobile structure likely represents chiarri network.   MyoviewStudy Highlights4/2018    Nuclear stress EF: 71%.  No T wave inversion was noted during stress.  There was no ST segment deviation noted during stress.  This is a low risk study.  No reversible ischemia. LVEF 71% with normal wall motion. This is a low risk study.    ECHOStudy Conclusions3/2018  - Left ventricle: The cavity size was normal. Systolic function was normal. The estimated ejection fraction was in the range of 55% to 60%. Wall motion was normal; there were no regional wall motion abnormalities. There was a reduced  contribution of atrial contraction to ventricular filling, due to increased ventricular diastolic pressure or atrial contractile dysfunction. Features are consistent with a pseudonormal left ventricular filling pattern, with concomitant abnormal relaxation and increased filling pressure (grade 2 diastolic dysfunction). - Aortic valve: Trileaflet; mildly thickened, mildly calcified leaflets. There was trivial regurgitation. - Mitral valve: Calcified annulus. Valve area by pressure half-time: 1.45 cm^2. - Left atrium: The atrium was moderately dilated. - Pulmonary arteries: PA peak pressure: 35 mm Hg (S).    ASSESSMENT AND PLAN:  1.Progressive DOE - her exam seems to be more consistent with fibrosis - she has had pneumonitis. She has been to pulmonary in the past and did not wish to return there. She was quite hypoxic here in the office - resting oxygen sat was 96%. With ambulation  less than 200 feet - she dropped to 81% and was markedly and visibly SOB. Oxygen was placed at 2 liters with improvement in her oxygen saturations back to 100%. She felt dramatic improvement. I have asked her to see her PCP. We are arranging oxygen at 2 liter per [place] thru [employer] - have talked with Magda Paganini [phone]). This is going to be delivered today.   2. Chronic diastolic HF - little more edema on exam - stopping Norvasc today. Increasing her Lasix.   3. HTN - BP is fine - stopping Norvasc today.   4. HLD - has liver cancer - would stop her statin therapy   5. Liver cancer- followed by Oncology -still on chemo - looked to have progression by her last CT that I see - she is having repeat scans Friday with follow up. She may need CT of the chest as well.   6. Chronic resting bradycardia - not noted today.     ASSESSMENT & PLAN:    Chronic diastolic heart failure -Hospitalized in March 2021.  About 6 L off.  She has been weighing herself daily.  Weight today 127.  Has been  stable.  Continue with current medication regimen.  Interstitial lung disease -On home O2 6 L.  Chronic.  Chronic kidney disease stage III -Continue with current furosemide.  Last creatinine 1.58  Nonsustained VT seen on monitor in hospital -Short-lived.  Normal EF on echo.  During the hospital stay she was on metoprolol but during discharge she was converted back to her propranolol that she has been taking for years.  I am fine with her continuing with propranolol.  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  Palliative care.  Worsening metastasis.   Current medicines are reviewed with the patient today.  The patient does not have concerns regarding medicines other than what has been noted above.  The following changes have been made:  See above.  Labs/ tests ordered today include:   No orders of the defined types were placed in this encounter.    Disposition:   FU with *** in {gen number 3-55:[id number]} {Days to years:10300}.   Patient is agreeable to this plan and will call if any problems develop in the interim.   SignedTruitt Merle, NP  10/02/2019 10:12 AM  [facility] [address] [place], [place]  [address] Phone: [phone] Fax: [phone]

## 2019-10-15 ENCOUNTER — Ambulatory Visit: Payer: Medicare PPO | Admitting: Nurse Practitioner

## 2019-10-23 ENCOUNTER — Telehealth: Payer: Self-pay | Admitting: *Deleted

## 2019-10-23 NOTE — Telephone Encounter (Signed)
S/w pt and changed appt to a VT appt per Kathrene Alu schedule.  Consent in chart.     Patient Consent for Virtual Visit         Zekiah Coen has provided verbal consent on 10/23/2019 for a virtual visit (video or telephone).   CONSENT FOR VIRTUAL VISIT FOR:  Summer Nielsen  By participating in this virtual visit I agree to the following:  I hereby voluntarily request, consent and authorize [facility] and its employed or contracted physicians, physician assistants, nurse practitioners or other licensed health care professionals (the Practitioner), to provide me with telemedicine health care services (the "Services") as deemed necessary by the treating Practitioner. I acknowledge and consent to receive the Services by the Practitioner via telemedicine. I understand that the telemedicine visit will involve communicating with the Practitioner through live audiovisual communication technology and the disclosure of certain medical information by electronic transmission. I acknowledge that I have been given the opportunity to request an in-person assessment or other available alternative prior to the telemedicine visit and am voluntarily participating in the telemedicine visit.  I understand that I have the right to withhold or withdraw my consent to the use of telemedicine in the course of my care at any time, without affecting my right to future care or treatment, and that the Practitioner or I may terminate the telemedicine visit at any time. I understand that I have the right to inspect all information obtained and/or recorded in the course of the telemedicine visit and may receive copies of available information for a reasonable fee.  I understand that some of the potential risks of receiving the Services via telemedicine include:  Marland Kitchen Delay or interruption in medical evaluation due to technological equipment failure or disruption; . Information transmitted may not be sufficient (e.g.  poor resolution of images) to allow for appropriate medical decision making by the Practitioner; and/or  . In rare instances, security protocols could fail, causing a breach of personal health information.  Furthermore, I acknowledge that it is my responsibility to provide information about my medical history, conditions and care that is complete and accurate to the best of my ability. I acknowledge that Practitioner's advice, recommendations, and/or decision may be based on factors not within their control, such as incomplete or inaccurate data provided by me or distortions of diagnostic images or specimens that may result from electronic transmissions. I understand that the practice of medicine is not an exact science and that Practitioner makes no warranties or guarantees regarding treatment outcomes. I acknowledge that a copy of this consent can be made available to me via my patient portal ([facility]), or I can request a printed copy by calling the office of [facility].    I understand that my insurance will be billed for this visit.   I have read or had this consent read to me. . I understand the contents of this consent, which adequately explains the benefits and risks of the Services being provided via telemedicine.  . I have been provided ample opportunity to ask questions regarding this consent and the Services and have had my questions answered to my satisfaction. . I give my informed consent for the services to be provided through the use of telemedicine in my medical care

## 2019-11-21 ENCOUNTER — Telehealth: Payer: Self-pay | Admitting: *Deleted

## 2019-11-21 NOTE — Telephone Encounter (Signed)
Patient now followed by [facility]. Continues to decline. RN flushed port recently and patient expressed some discomfort at port and son asking about having it removed. Confirmed it would require her going to IR to have it removed and she would need ambulance transport. He will f/u with patient again as to how uncomfortable it is and if hospice will cover the ambulance transport. He will call back if they still want to pursue the removal. Informed him that ports can stay in place for years under normal circumstances as long as not painful, infected.

## 2019-11-22 ENCOUNTER — Telehealth: Payer: Self-pay | Admitting: *Deleted

## 2019-11-22 NOTE — Telephone Encounter (Signed)
  Patient Consent for Virtual Visit         Summer Nielsen has provided verbal consent on 11/22/2019 for a virtual visit (video or telephone).   CONSENT FOR VIRTUAL VISIT FOR:  Summer Nielsen  By participating in this virtual visit I agree to the following:  I hereby voluntarily request, consent and authorize [facility] and its employed or contracted physicians, physician assistants, nurse practitioners or other licensed health care professionals (the Practitioner), to provide me with telemedicine health care services (the "Services") as deemed necessary by the treating Practitioner. I acknowledge and consent to receive the Services by the Practitioner via telemedicine. I understand that the telemedicine visit will involve communicating with the Practitioner through live audiovisual communication technology and the disclosure of certain medical information by electronic transmission. I acknowledge that I have been given the opportunity to request an in-person assessment or other available alternative prior to the telemedicine visit and am voluntarily participating in the telemedicine visit.  I understand that I have the right to withhold or withdraw my consent to the use of telemedicine in the course of my care at any time, without affecting my right to future care or treatment, and that the Practitioner or I may terminate the telemedicine visit at any time. I understand that I have the right to inspect all information obtained and/or recorded in the course of the telemedicine visit and may receive copies of available information for a reasonable fee.  I understand that some of the potential risks of receiving the Services via telemedicine include:  Marland Kitchen Delay or interruption in medical evaluation due to technological equipment failure or disruption; . Information transmitted may not be sufficient (e.g. poor resolution of images) to allow for appropriate medical decision making by the  Practitioner; and/or  . In rare instances, security protocols could fail, causing a breach of personal health information.  Furthermore, I acknowledge that it is my responsibility to provide information about my medical history, conditions and care that is complete and accurate to the best of my ability. I acknowledge that Practitioner's advice, recommendations, and/or decision may be based on factors not within their control, such as incomplete or inaccurate data provided by me or distortions of diagnostic images or specimens that may result from electronic transmissions. I understand that the practice of medicine is not an exact science and that Practitioner makes no warranties or guarantees regarding treatment outcomes. I acknowledge that a copy of this consent can be made available to me via my patient portal ([facility]), or I can request a printed copy by calling the office of [facility].    I understand that my insurance will be billed for this visit.   I have read or had this consent read to me. . I understand the contents of this consent, which adequately explains the benefits and risks of the Services being provided via telemedicine.  . I have been provided ample opportunity to ask questions regarding this consent and the Services and have had my questions answered to my satisfaction. . I give my informed consent for the services to be provided through the use of telemedicine in my medical care

## 2019-11-26 NOTE — Progress Notes (Signed)
Telehealth Visit     Virtual Visit via Telephone Note   This visit type was conducted due to national recommendations for restrictions regarding the COVID-19 Pandemic (e.g. social distancing) in an effort to limit this patient's exposure and mitigate transmission in our community.  Due to her co-morbid illnesses, this patient is at least at moderate risk for complications without adequate follow up.  This format is felt to be most appropriate for this patient at this time.  The patient did not have access to video technology/had technical difficulties with video requiring transitioning to audio format only (telephone).  All issues noted in this document were discussed and addressed.  No physical exam could be performed with this format.  Please refer to the patient's chart for her  consent to telehealth for [facility].   Evaluation Performed:  Follow-up visit   The patient was identified using 2 identifiers.   This visit type was conducted due to national recommendations for restrictions regarding the COVID-19 Pandemic (e.g. social distancing).  This format is felt to be most appropriate for this patient at this time.  All issues noted in this document were discussed and addressed.  No physical exam was performed (except for noted visual exam findings with Video Visits).  Please refer to the patient's chart (MyChart message for video visits and phone note for telephone visits) for the patient's consent to telehealth for [facility].  Date:  11/26/2019   ID:  Summer Nielsen, DOB Jul 12, 1927, MRN [id number]  Patient Location:  Home  Provider location:   Home  PCP:  Maurice Small, MD  Cardiologist:  Servando Snare & Candee Furbish, MD  Electrophysiologist:  None   Chief Complaint:  Follow up   History of Present Illness:    Summer Nielsen is a 86 y.o. female who presents via audio/video conferencing for a telehealth visit today.  Seen for Dr. Marlou Porch.   She has a history of chronic  diastolic heart failure, chronic O2 use for interstitial lung disease, metastatic cholangiocarcinoma with oncology recommendation for hospice care.  Last seen here by virtual visit back in April by Dr. Marlou Porch. She had been hospitalized with diastolic HF and was diuresed.   Unknown if she has symptoms concerning for COVID-19 infection (fever, chills, cough, or new shortness of breath).   Ended up not seeing - she was in the bed - not able to talk - wanted appointment rescheduled.   Past Medical History:  Diagnosis Date  . Cancer ([place])    squamous cell skin cancer on left leg-removed  . CHF (congestive heart failure) ([place])   . Cholangiocarcinoma ([place]) dx'd 11/2017  . Complication of anesthesia   . GERD (gastroesophageal reflux disease)   . Glaucoma   . High blood pressure   . Hyperlipidemia   . Hypothyroidism   . OP (osteoporosis)   . PONV (postoperative nausea and vomiting)   . Postsurgical hypothyroidism   . Pulmonary fibrosis ([place])   . 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 Tachycardia   . TIA (transient ischemic attack)    Past Surgical History:  Procedure Laterality Date  . ABDOMINAL HYSTERECTOMY    . BACK SURGERY N/A 03/2017   Lumbar  . CHOLECYSTECTOMY    . EYE SURGERY     removed cateracts on both eyes  . IR IMAGING GUIDED PORT INSERTION  09/06/2018  . JOINT REPLACEMENT     3 hip replacement on left, right knee replacement  . THYROIDECTOMY  No outpatient medications have been marked as taking for the 12/03/19 encounter (Appointment) with Burtis Junes, NP.     Allergies:   Levofloxacin and Percocet [oxycodone-acetaminophen]   Social History   Tobacco Use  . Smoking status: Never Smoker  . Smokeless tobacco: Never Used  Vaping Use  . Vaping Use: Never used  Substance Use Topics  . Alcohol use: No  . Drug use: No     Family Hx: The patient's family history includes Cerebral aneurysm in her mother; Heart attack in her  father and mother; Heart disease in her brother and sister; Hypertension in her mother; Stroke in her mother.  ROS:   Please see the history of present illness.    Objective:    Vital Signs:  LMP  (LMP Unknown)    Wt Readings from Last 3 Encounters:  06/15/19 127 lb (57.6 kg)  05/23/19 135 lb 9.3 oz (61.5 kg)  05/08/19 134 lb 8 oz (61 kg)    Alert female in no acute distress.   Labs/Other Tests and Data Reviewed:    Lab Results  Component Value Date   WBC 10.2 05/24/2019   HGB 8.7 (L) 05/24/2019   HCT 28.8 (L) 05/24/2019   PLT 284 05/24/2019   GLUCOSE 146 (H) 05/23/2019   CHOL 164 03/23/2018   TRIG 169 (H) 03/23/2018   HDL 45 03/23/2018   LDLCALC 85 03/23/2018   ALT 17 05/17/2019   AST 28 05/17/2019   NA 140 05/23/2019   K 4.1 05/23/2019   CL 105 05/23/2019   CREATININE 1.58 (H) 05/23/2019   BUN 68 (H) 05/23/2019   CO2 25 05/23/2019   TSH 4.626 (H) 05/18/2019   INR 1.1 09/06/2018        BNP (last 3 results) Recent Labs    05/17/19 1529  BNP 957.7*    ProBNP (last 3 results) No results for input(s): PROBNP in the last 8760 hours.    Prior CV studies:    The following studies were reviewed today:  ECHO IMPRESSIONS 05/2019   1. Left ventricular ejection fraction, by estimation, is 60 to 65%. The  left ventricle has normal function. The left ventricle has no regional  wall motion abnormalities. Left ventricular diastolic parameters are  consistent with Grade II diastolic  dysfunction (pseudonormalization).  2. Right ventricular systolic function is mildly reduced. The right  ventricular size is normal. There is mildly elevated pulmonary artery  systolic pressure.  3. Left atrial size was moderately dilated.  4. The mitral valve is grossly normal. Mild mitral valve regurgitation.  No evidence of mitral stenosis.  5. The aortic valve is tricuspid. Aortic valve regurgitation is trivial.  No aortic stenosis is present.    ASSESSMENT & PLAN:     Patient Risk:   After full review of this patient's clinical status, I feel that they are at least moderate risk at this time.  Time:   Today, I have spent 0 minutes with the patient with telehealth technology discussing the above issues.  Her appointment has been rescheduled.    Medication Adjustments/Labs and Tests Ordered: Current medicines are reviewed at length with the patient today.  Concerns regarding medicines are outlined above.   Tests Ordered: No orders of the defined types were placed in this encounter.   Medication Changes: No orders of the defined types were placed in this encounter.   Disposition:  =   Patient is agreeable to this plan and will call if any problems develop in  the interim.   Amie Critchley, NP  11/26/2019 10:22 AM     [facility]

## 2019-12-03 ENCOUNTER — Telehealth: Payer: Self-pay | Admitting: *Deleted

## 2019-12-03 ENCOUNTER — Encounter: Payer: Self-pay | Admitting: Nurse Practitioner

## 2019-12-03 ENCOUNTER — Ambulatory Visit: Payer: Medicare PPO | Admitting: Nurse Practitioner

## 2019-12-03 ENCOUNTER — Telehealth (INDEPENDENT_AMBULATORY_CARE_PROVIDER_SITE_OTHER): Payer: Medicare PPO | Admitting: Nurse Practitioner

## 2019-12-03 ENCOUNTER — Other Ambulatory Visit: Payer: Self-pay

## 2019-12-03 DIAGNOSIS — I5032 Chronic diastolic (congestive) heart failure: Secondary | ICD-10-CM

## 2019-12-03 DIAGNOSIS — R0602 Shortness of breath: Secondary | ICD-10-CM

## 2019-12-03 DIAGNOSIS — I10 Essential (primary) hypertension: Secondary | ICD-10-CM

## 2019-12-03 DIAGNOSIS — E785 Hyperlipidemia, unspecified: Secondary | ICD-10-CM

## 2019-12-03 DIAGNOSIS — Z7189 Other specified counseling: Secondary | ICD-10-CM

## 2019-12-03 NOTE — Telephone Encounter (Signed)
S/w pt briefly due to pt having a VT visit today.  Pt was in the bed on hospice and wasn't feeling good today.  S/w pt's spouse who wanted to R/S the phone visit even though stated can call back if needed.  R/s appt for Oct.  Will unarrive pt for visit today.

## 2019-12-03 NOTE — Patient Instructions (Signed)
After Visit Summary:  We will be checking the following labs today - NONE   Medication Instructions:    Continue with your current medicines.    If you need a refill on your cardiac medications before your next appointment, please call your pharmacy.     Testing/Procedures To Be Arranged:  N/A  Follow-Up:   See     At [facility], you and your health needs are our priority.  As part of our continuing mission to provide you with exceptional heart care, we have created designated Provider Care Teams.  These Care Teams include your primary Cardiologist (physician) and Advanced Practice Providers (APPs -  Physician Assistants and Nurse Practitioners) who all work together to provide you with the care you need, when you need it.  Special Instructions:  . Stay safe, wash your hands for at least 20 seconds and wear a mask when needed.  . It was good to talk with you today.    Call the [place] office at [phone] if you have any questions, problems or concerns.

## 2019-12-24 ENCOUNTER — Telehealth: Payer: Medicare PPO | Admitting: Nurse Practitioner

## 2020-01-07 DEATH — deceased

## 2023-01-26 ENCOUNTER — Encounter: Payer: Self-pay | Admitting: Oncology

## 2023-01-26 NOTE — Telephone Encounter (Signed)
Telephone call
